# Patient Record
Sex: Male | Born: 1950
Health system: Southern US, Community
[De-identification: ages and names within clinical notes are randomized; demographics above are authoritative.]

## PROBLEM LIST (undated history)

## (undated) DIAGNOSIS — I10 Essential (primary) hypertension: Secondary | ICD-10-CM

## (undated) DIAGNOSIS — C160 Malignant neoplasm of cardia: Secondary | ICD-10-CM

## (undated) DIAGNOSIS — Z720 Tobacco use: Secondary | ICD-10-CM

## (undated) DIAGNOSIS — K5790 Diverticulosis of intestine, part unspecified, without perforation or abscess without bleeding: Secondary | ICD-10-CM

## (undated) DIAGNOSIS — D649 Anemia, unspecified: Secondary | ICD-10-CM

## (undated) DIAGNOSIS — E785 Hyperlipidemia, unspecified: Secondary | ICD-10-CM

## (undated) DIAGNOSIS — Z809 Family history of malignant neoplasm, unspecified: Secondary | ICD-10-CM

## (undated) DIAGNOSIS — R7303 Prediabetes: Secondary | ICD-10-CM

## (undated) HISTORY — DX: Malignant neoplasm of cardia: C16.0

## (undated) HISTORY — DX: Anemia, unspecified: D64.9

## (undated) HISTORY — PX: COLONOSCOPY: SHX174

## (undated) HISTORY — DX: Hyperlipidemia, unspecified: E78.5

## (undated) HISTORY — PX: NO PAST SURGERIES: SHX2092

## (undated) HISTORY — DX: Tobacco use: Z72.0

## (undated) HISTORY — DX: Family history of malignant neoplasm, unspecified: Z80.9

## (undated) HISTORY — DX: Diverticulosis of intestine, part unspecified, without perforation or abscess without bleeding: K57.90

## (undated) HISTORY — PX: WISDOM TOOTH EXTRACTION: SHX21

---

## 2007-04-08 ENCOUNTER — Ambulatory Visit: Payer: Self-pay | Admitting: Internal Medicine

## 2007-04-08 DIAGNOSIS — I10 Essential (primary) hypertension: Secondary | ICD-10-CM | POA: Insufficient documentation

## 2007-04-16 ENCOUNTER — Encounter (INDEPENDENT_AMBULATORY_CARE_PROVIDER_SITE_OTHER): Payer: Self-pay | Admitting: *Deleted

## 2007-04-17 ENCOUNTER — Encounter (INDEPENDENT_AMBULATORY_CARE_PROVIDER_SITE_OTHER): Payer: Self-pay | Admitting: *Deleted

## 2007-04-17 ENCOUNTER — Ambulatory Visit: Payer: Self-pay | Admitting: Internal Medicine

## 2007-05-08 ENCOUNTER — Encounter: Payer: Self-pay | Admitting: Internal Medicine

## 2008-04-07 ENCOUNTER — Encounter: Payer: Self-pay | Admitting: Internal Medicine

## 2008-04-08 ENCOUNTER — Ambulatory Visit: Payer: Self-pay | Admitting: Internal Medicine

## 2008-04-14 ENCOUNTER — Ambulatory Visit: Payer: Self-pay | Admitting: Internal Medicine

## 2008-04-14 LAB — CONVERTED CEMR LAB
OCCULT 1: NEGATIVE
OCCULT 2: NEGATIVE
OCCULT 3: NEGATIVE

## 2008-04-15 ENCOUNTER — Encounter (INDEPENDENT_AMBULATORY_CARE_PROVIDER_SITE_OTHER): Payer: Self-pay | Admitting: *Deleted

## 2008-05-12 ENCOUNTER — Ambulatory Visit: Payer: Self-pay | Admitting: Internal Medicine

## 2008-05-22 ENCOUNTER — Encounter (INDEPENDENT_AMBULATORY_CARE_PROVIDER_SITE_OTHER): Payer: Self-pay | Admitting: *Deleted

## 2008-05-22 LAB — CONVERTED CEMR LAB
BUN: 19 mg/dL (ref 6–23)
CO2: 29 meq/L (ref 19–32)
GFR calc Af Amer: 111 mL/min
Glucose, Bld: 80 mg/dL (ref 70–99)
HDL: 56.3 mg/dL (ref >39.0)
Potassium: 4.2 meq/L (ref 3.5–5.1)
Triglycerides: 114 mg/dL (ref 0–149)

## 2009-04-19 ENCOUNTER — Encounter (INDEPENDENT_AMBULATORY_CARE_PROVIDER_SITE_OTHER): Payer: Self-pay | Admitting: *Deleted

## 2009-04-20 ENCOUNTER — Ambulatory Visit: Payer: Self-pay | Admitting: Internal Medicine

## 2009-05-03 ENCOUNTER — Telehealth (INDEPENDENT_AMBULATORY_CARE_PROVIDER_SITE_OTHER): Payer: Self-pay | Admitting: *Deleted

## 2009-05-11 ENCOUNTER — Ambulatory Visit: Payer: Self-pay | Admitting: Internal Medicine

## 2009-05-11 DIAGNOSIS — Z720 Tobacco use: Secondary | ICD-10-CM

## 2009-05-11 DIAGNOSIS — N400 Enlarged prostate without lower urinary tract symptoms: Secondary | ICD-10-CM | POA: Insufficient documentation

## 2009-05-12 ENCOUNTER — Encounter (INDEPENDENT_AMBULATORY_CARE_PROVIDER_SITE_OTHER): Payer: Self-pay | Admitting: *Deleted

## 2009-05-12 LAB — CONVERTED CEMR LAB
ALT: 29 units/L (ref 0–53)
AST: 31 units/L (ref 0–37)
Albumin: 4.6 g/dL (ref 3.5–5.2)
Alkaline Phosphatase: 58 units/L (ref 39–117)
BUN: 12 mg/dL (ref 6–23)
Basophils Absolute: 0 10*3/uL (ref 0.0–0.1)
CO2: 29 meq/L (ref 19–32)
Eosinophils Absolute: 0.1 10*3/uL (ref 0.0–0.7)
GFR calc non Af Amer: 104.59 mL/min (ref >60)
Glucose, Bld: 96 mg/dL (ref 70–99)
Hemoglobin: 15.9 g/dL (ref 13.0–17.0)
Lymphocytes Relative: 17.7 % (ref 12.0–46.0)
MCHC: 34.3 g/dL (ref 30.0–36.0)
Monocytes Relative: 6.4 % (ref 3.0–12.0)
Neutro Abs: 7.8 10*3/uL — ABNORMAL HIGH (ref 1.4–7.7)
Neutrophils Relative %: 74.7 % (ref 43.0–77.0)
PSA: 0.81 ng/mL (ref 0.10–4.00)
Platelets: 240 10*3/uL (ref 150.0–400.0)
Potassium: 4.1 meq/L (ref 3.5–5.1)
RDW: 12 % (ref 11.5–14.6)
Sodium: 141 meq/L (ref 135–145)
TSH: 0.48 microintl units/mL (ref 0.35–5.50)
Total Protein: 7.1 g/dL (ref 6.0–8.3)

## 2009-05-18 ENCOUNTER — Encounter (INDEPENDENT_AMBULATORY_CARE_PROVIDER_SITE_OTHER): Payer: Self-pay

## 2009-05-19 ENCOUNTER — Ambulatory Visit: Payer: Self-pay | Admitting: Internal Medicine

## 2009-06-07 ENCOUNTER — Ambulatory Visit: Payer: Self-pay | Admitting: Internal Medicine

## 2009-06-09 ENCOUNTER — Encounter: Payer: Self-pay | Admitting: Internal Medicine

## 2009-08-05 ENCOUNTER — Ambulatory Visit: Payer: Self-pay | Admitting: Internal Medicine

## 2009-08-05 DIAGNOSIS — E785 Hyperlipidemia, unspecified: Secondary | ICD-10-CM | POA: Insufficient documentation

## 2009-08-05 LAB — CONVERTED CEMR LAB
Cholesterol, target level: 200 mg/dL
HDL goal, serum: 40 mg/dL
LDL Goal: 110 mg/dL

## 2009-12-27 ENCOUNTER — Ambulatory Visit: Payer: Self-pay | Admitting: Internal Medicine

## 2009-12-27 LAB — CONVERTED CEMR LAB
Direct LDL: 131.7 mg/dL
HDL: 62.9 mg/dL (ref >39.00)

## 2010-01-03 ENCOUNTER — Ambulatory Visit: Payer: Self-pay | Admitting: Internal Medicine

## 2010-04-18 ENCOUNTER — Ambulatory Visit: Payer: Self-pay | Admitting: Internal Medicine

## 2010-07-19 NOTE — Assessment & Plan Note (Signed)
Summary: 6 month roa//lch   Vital Signs:  Patient profile:   60 year old male Weight:      182 pounds Pulse rate:   72 / minute Resp:     15 per minute BP sitting:   110 / 62  (left arm) Cuff size:   large  Vitals Entered By: Shonna Chock CMA (January 03, 2010 3:10 PM) CC: 6 Month follow-up, discuss labs (copy given), Lipid Management   CC:  6 Month follow-up, discuss labs (copy given), and Lipid Management.  History of Present Illness: Labs reviewed & risks discussed. CVE daily as walking or ellipitical.Decreased processed foods. Smoking 2-5 / day. Statin not elected.  Lipid Management History:      Positive NCEP/ATP III risk factors include male age 66 years old or older, family history for ischemic heart disease (males less than 68 years old), current tobacco user, and hypertension.  Negative NCEP/ATP III risk factors include non-diabetic, no ASHD (atherosclerotic heart disease), no prior stroke/TIA, no peripheral vascular disease, and no history of aortic aneurysm.     Current Medications (verified): 1)  Lisinopril-Hydrochlorothiazide 20-12.5 Mg Tabs (Lisinopril-Hydrochlorothiazide) .Marland Kitchen.. 1 Once Daily 2)  Aspirin 81 Mg Tabs (Aspirin) .Marland Kitchen.. 1 By Mouth Once Daily 3)  Fish Oil 1000 Mg Caps (Omega-3 Fatty Acids) .Marland Kitchen.. 1 By Mouth Once Daily 4)  Multivitamins  Tabs (Multiple Vitamin) .Marland Kitchen.. 1 By Mouth Once Daily  Allergies: 1)  ! * Muscle Relaxer  Past History:  Past Medical History: Hypertension Severe contact dermatitis with Poison Ivy; Dramatic FH premature CAD;Smoker; Possible remote Asbestos exposure Hyperlipidemia: NMR : LDL 161(0960/454), HDL 70,TG 139. LDL goal < 110. Framingham Study LDL goal = < 130.  Family History: Father:MI @ 20, smoker  Mother: bone  cancer Siblings: sister ? uterine cancer , ? boney mets ; P uncle smoker, d MI @ 20; P  uncle   d MI @ 64  Review of Systems CV:  Denies chest pain or discomfort, leg cramps with exertion, shortness of breath with  exertion, swelling of feet, and swelling of hands.  Physical Exam  General:  well-nourished; alert,appropriate and cooperative throughout examination Heart:  Normal rate and regular rhythm. S1  normal without gallop, murmur, click, rub or other extra sounds.S2 slurred Pulses:  R and L carotid,radial,dorsalis pedis and posterior tibial pulses are full and equal bilaterally   Impression & Recommendations:  Problem # 1:  HYPERLIPIDEMIA (ICD-272.4)  Problem # 2:  SMOKER (ICD-305.1) impact on plaque discussed  Problem # 3:  HYPERTENSION, ESSENTIAL NOS (ICD-401.9) controlled His updated medication list for this problem includes:    Lisinopril-hydrochlorothiazide 20-12.5 Mg Tabs (Lisinopril-hydrochlorothiazide) .Marland Kitchen... 1 once daily  Complete Medication List: 1)  Lisinopril-hydrochlorothiazide 20-12.5 Mg Tabs (Lisinopril-hydrochlorothiazide) .Marland Kitchen.. 1 once daily 2)  Aspirin 81 Mg Tabs (Aspirin) .Marland Kitchen.. 1 by mouth once daily 3)  Fish Oil 1000 Mg Caps (Omega-3 fatty acids) .Marland Kitchen.. 1 by mouth once daily 4)  Multivitamins Tabs (Multiple vitamin) .Marland Kitchen.. 1 by mouth once daily  Lipid Assessment/Plan:      Based on NCEP/ATP III, the patient's risk factor category is "2 or more risk factors and a calculated 10 year CAD risk of > 20%".  The patient's lipid goals are as follows: Total cholesterol goal is 200; LDL cholesterol goal is 110; HDL cholesterol goal is 40; Triglyceride goal is 150.  His LDL cholesterol goal has not been met.  Secondary causes for hyperlipidemia have been ruled out.  He has been counseled on adjunctive measures for  lowering his cholesterol and has been provided with dietary instructions.    Patient Instructions: 1)  Monitor BP on LisinoprilHCT 1/2 once daily ; goal = AVERAGE < 135/85.Consider risk modification as discussed. Prescriptions: LISINOPRIL-HYDROCHLOROTHIAZIDE 20-12.5 MG TABS (LISINOPRIL-HYDROCHLOROTHIAZIDE) 1 once daily  #90 x 1   Entered and Authorized by:   Marga Melnick MD    Signed by:   Marga Melnick MD on 01/03/2010   Method used:   Print then Give to Patient   RxID:   [id number]

## 2010-07-19 NOTE — Letter (Signed)
Summary: Cancer Screening/[facility] Personalized Risk Profile  Cancer Screening/[facility] Personalized Risk Profile   Imported By: [name] 08/11/2009 10:45:54  _____________________________________________________________________  External Attachment:    Type:   Image     Comment:   External Document

## 2010-07-19 NOTE — Assessment & Plan Note (Signed)
Summary: CPX- JR, FASTING LABS   Vital Signs:  Patient profile:   60 year old male Height:      71.5 inches Weight:      184.6 pounds Temp:     98.0 degrees F oral Pulse rate:   61 / minute Resp:     14 per minute BP sitting:   142 / 81  (left arm) Cuff size:   large  Vitals Entered By: Shonna Chock (August 05, 2009 9:25 AM) CC: Follow-up visit: Follow-up on NMR, fasting if labs needed. Patient never took Pravastatin, Lipid Management Comments REVIEWED MED LIST, PATIENT AGREED DOSE AND INSTRUCTION CORRECT   CC:  Follow-up visit: Follow-up on NMR, fasting if labs needed. Patient never took Pravastatin, and Lipid Management.  History of Present Illness: NMR Lipoprofile reviewed & risks discussed with Mr & Mrs  Arpino. [employer]' ; smoking decreased by 1/2. CVE as walking  & machines.  Lipid Management History:      Positive NCEP/ATP III risk factors include male age 34 years old or older, family history for ischemic heart disease (males less than 40 years old), current tobacco user, and hypertension.  Negative NCEP/ATP III risk factors include non-diabetic, no ASHD (atherosclerotic heart disease), no prior stroke/TIA, no peripheral vascular disease, and no history of aortic aneurysm.     Allergies: 1)  ! * Muscle Relaxer  Past History:  Past Medical History: Hypertension Severe contact dermatitis with Poison Ivy; Dramatic FH premature CAD;Smoker; Possible remote Asbestos exposure Hyperlipidemia: NMR : LDL 295(6213/086), HDL 70,TG 139. LDL goal < 110  Family History: Father:MI @ 93, smoker  Mother: bone CA Siblings: sister ? uterine CA , ? boney mets ; P uncle smoker, d MI @ 74; P  smoker,uncle d MI @ 49  Review of Systems CV:  Denies chest pain or discomfort, leg cramps with exertion, palpitations, and shortness of breath with exertion.  Physical Exam  General:  well-nourished; alert,appropriate and cooperative throughout examination Heart:  Normal rate and  regular rhythm. S1 and S2 normal without gallop, murmur, click, rub . S4 Pulses:  R and L carotid,radial,dorsalis pedis and posterior tibial pulses are full and equal bilaterally Psych:  memory intact for recent and remote, normally interactive, and good eye contact.   Intelligent & focused   Impression & Recommendations:  Problem # 1:  HYPERLIPIDEMIA (ICD-272.4)  The following medications were removed from the medication list:    Pravastatin Sodium 20 Mg Tabs (Pravastatin sodium) .Marland Kitchen... 1 at bedtime  Complete Medication List: 1)  Lisinopril-hydrochlorothiazide 20-12.5 Mg Tabs (Lisinopril-hydrochlorothiazide) .Marland Kitchen.. 1 once daily  Lipid Assessment/Plan:      Based on NCEP/ATP III, the patient's risk factor category is "2 or more risk factors and a calculated 10 year CAD risk of > 20%".  The patient's lipid goals are as follows: Total cholesterol goal is 200; LDL cholesterol goal is 110; HDL cholesterol goal is 40; Triglyceride goal is 150.  His LDL cholesterol goal has not been met.  Secondary causes for hyperlipidemia have been ruled out.  He has been counseled on adjunctive measures for lowering his cholesterol and has been provided with dietary instructions.    Patient Instructions: 1)  Review Dr Gildardo Griffes Eat , Drink & Be Healthy.-6  months. 2)  Take a coated  Aspirin every day. 3)  Lipid Panel prior to visit, ICD-9:272.4. Your LDL goal = < 110.

## 2010-07-19 NOTE — Assessment & Plan Note (Signed)
Summary: flu shot/cbs  Nurse Visit   Allergies: 1)  ! * Muscle Relaxer  Orders Added: 1)  Admin 1st Vaccine [90471] 2)  Flu Vaccine 97yrs + [78295]         Flu Vaccine Consent Questions     Do you have a history of severe allergic reactions to this vaccine? no    Any prior history of allergic reactions to egg and/or gelatin? no    Do you have a sensitivity to the preservative Thimersol? no    Do you have a past history of Guillan-Barre Syndrome? no    Do you currently have an acute febrile illness? no    Have you ever had a severe reaction to latex? no    Vaccine information given and explained to patient? yes    Are you currently pregnant? no    Lot Number:[id number]   Exp Date:12/17/2010   Site Given  Left Deltoid IM

## 2010-10-11 ENCOUNTER — Other Ambulatory Visit: Payer: Self-pay | Admitting: Internal Medicine

## 2011-01-22 ENCOUNTER — Other Ambulatory Visit: Payer: Self-pay | Admitting: Internal Medicine

## 2011-01-23 NOTE — Telephone Encounter (Signed)
Patient needs to schedule a CPX  

## 2011-02-24 ENCOUNTER — Telehealth: Payer: Self-pay | Admitting: Internal Medicine

## 2011-02-24 NOTE — Telephone Encounter (Signed)
Message copied by Merrie Roof on Fri Feb 24, 2011 11:56 AM ------      Message from: Pecola Lawless      Created: Sun Jan 29, 2011  3:05 PM       Last labs 7/11; he needs F/U before next refill

## 2011-02-24 NOTE — Telephone Encounter (Signed)
Pt has appt for followup for 03/01/2011 at 3:00

## 2011-03-01 ENCOUNTER — Ambulatory Visit (INDEPENDENT_AMBULATORY_CARE_PROVIDER_SITE_OTHER): Payer: BC Managed Care – PPO | Admitting: Internal Medicine

## 2011-03-01 ENCOUNTER — Encounter: Payer: Self-pay | Admitting: Internal Medicine

## 2011-03-01 DIAGNOSIS — I10 Essential (primary) hypertension: Secondary | ICD-10-CM

## 2011-03-01 DIAGNOSIS — E785 Hyperlipidemia, unspecified: Secondary | ICD-10-CM

## 2011-03-01 MED ORDER — LISINOPRIL-HYDROCHLOROTHIAZIDE 20-12.5 MG PO TABS: 1.0000 | ORAL_TABLET | Freq: Every day | ORAL | Status: DC

## 2011-03-01 NOTE — Progress Notes (Signed)
  Subjective:    Patient ID: [name] [name], male    DOB: 28-Oct-1950, 60 y.o.   MRN: [id number]  HPI HYPERTENSION: Disease Monitoring  Blood pressure range: not monitored  Chest pain: no   Dyspnea: no   Claudication: no   Medication compliance: yes,   Medication Side Effects  Lightheadedness: no   Urinary frequency: no   Edema: no    Preventitive Healthcare:  Exercise: yes, walking 9 holes daily w/o symptoms   Diet Pattern: heart healthy diet  Salt Restriction: yes Smoking :socially intermittently      Review of Systems     Objective:   Physical Exam General appearance ;thin but in good health and well nourished  Eyes: No conjunctival inflammation or scleral icterus is present.     Heart:  Normal rate and regular rhythm. S1 and S2 normal without gallop,  click, rub or other extra sounds  . Grade 1/6 basilar systolic murmur   Lungs:Chest clear to auscultation; no wheezes, rhonchi,rales ,or rubs present.No increased work of breathing.    Skin:Warm & dry. ; no ischemic changes   All pulses intact without  bruits .             Assessment & Plan:  #1 hypertension, questionable control.  #2 dyslipidemia with excellent HDL levels.  #3 family history of premature coronary disease  Plan: See orders and recommendations.

## 2011-03-01 NOTE — Patient Instructions (Signed)
Blood Pressure Goal  Ideally is an AVERAGE < 135/85. This AVERAGE should be calculated from @ least 5-7 BP readings taken @ different times of day on different days of week. You should not respond to isolated BP readings , but rather the AVERAGE for that week.  Please  schedule fasting Labs : BMET,Lipids, hepatic panel, TSH(401.9, 272.4,995.20)

## 2011-03-03 ENCOUNTER — Other Ambulatory Visit: Payer: Self-pay | Admitting: Internal Medicine

## 2011-03-03 DIAGNOSIS — I10 Essential (primary) hypertension: Secondary | ICD-10-CM

## 2011-03-03 DIAGNOSIS — T887XXA Unspecified adverse effect of drug or medicament, initial encounter: Secondary | ICD-10-CM

## 2011-03-03 DIAGNOSIS — E785 Hyperlipidemia, unspecified: Secondary | ICD-10-CM

## 2011-03-06 ENCOUNTER — Other Ambulatory Visit (INDEPENDENT_AMBULATORY_CARE_PROVIDER_SITE_OTHER): Payer: BC Managed Care – PPO

## 2011-03-06 DIAGNOSIS — T887XXA Unspecified adverse effect of drug or medicament, initial encounter: Secondary | ICD-10-CM

## 2011-03-06 DIAGNOSIS — E785 Hyperlipidemia, unspecified: Secondary | ICD-10-CM

## 2011-03-06 DIAGNOSIS — I10 Essential (primary) hypertension: Secondary | ICD-10-CM

## 2011-03-06 LAB — BASIC METABOLIC PANEL
BUN: 16 mg/dL (ref 6–23)
Creatinine, Ser: 1 mg/dL (ref 0.4–1.5)
GFR: 84.24 mL/min (ref >60.00)
Glucose, Bld: 101 mg/dL — ABNORMAL HIGH (ref 70–99)

## 2011-03-06 LAB — HEPATIC FUNCTION PANEL
ALT: 30 U/L (ref 0–53)
Alkaline Phosphatase: 54 U/L (ref 39–117)
Bilirubin, Direct: 0.1 mg/dL (ref 0.0–0.3)
Total Bilirubin: 1 mg/dL (ref 0.3–1.2)
Total Protein: 7 g/dL (ref 6.0–8.3)

## 2011-03-06 LAB — LIPID PANEL
Cholesterol: 198 mg/dL (ref 0–200)
LDL Cholesterol: 115 mg/dL — ABNORMAL HIGH (ref 0–99)
VLDL: 24 mg/dL (ref 0.0–40.0)

## 2011-03-06 NOTE — Progress Notes (Signed)
Labs only

## 2011-03-22 ENCOUNTER — Other Ambulatory Visit (INDEPENDENT_AMBULATORY_CARE_PROVIDER_SITE_OTHER): Payer: BC Managed Care – PPO

## 2011-03-22 DIAGNOSIS — R7309 Other abnormal glucose: Secondary | ICD-10-CM

## 2011-03-22 DIAGNOSIS — R799 Abnormal finding of blood chemistry, unspecified: Secondary | ICD-10-CM

## 2011-03-22 NOTE — Progress Notes (Signed)
Labs only

## 2011-03-24 LAB — HEMOGLOBIN A1C: Hgb A1c MFr Bld: 6.4 % (ref 4.6–6.5)

## 2011-04-27 ENCOUNTER — Ambulatory Visit (INDEPENDENT_AMBULATORY_CARE_PROVIDER_SITE_OTHER): Payer: BC Managed Care – PPO

## 2011-04-27 DIAGNOSIS — Z23 Encounter for immunization: Secondary | ICD-10-CM

## 2011-10-13 ENCOUNTER — Other Ambulatory Visit: Payer: Self-pay | Admitting: Internal Medicine

## 2011-12-12 ENCOUNTER — Other Ambulatory Visit: Payer: Self-pay | Admitting: Internal Medicine

## 2011-12-12 NOTE — Telephone Encounter (Signed)
Refill done.  

## 2012-02-05 ENCOUNTER — Other Ambulatory Visit: Payer: Self-pay | Admitting: Internal Medicine

## 2012-02-06 NOTE — Telephone Encounter (Signed)
Patient needs to schedule a CPX 02/2012 or after

## 2012-04-17 ENCOUNTER — Ambulatory Visit (INDEPENDENT_AMBULATORY_CARE_PROVIDER_SITE_OTHER): Payer: Managed Care, Other (non HMO)

## 2012-04-17 DIAGNOSIS — Z23 Encounter for immunization: Secondary | ICD-10-CM

## 2012-04-26 ENCOUNTER — Encounter: Payer: Self-pay | Admitting: Internal Medicine

## 2013-01-01 ENCOUNTER — Encounter: Payer: Self-pay | Admitting: Internal Medicine

## 2013-02-04 ENCOUNTER — Other Ambulatory Visit: Payer: Self-pay | Admitting: Internal Medicine

## 2013-02-04 NOTE — Telephone Encounter (Signed)
Left message on voicemail informing patient that he is overdue for an appointment. An appointment will need to be schedule ASAP, patient can be seen tomorrow in same day slot.

## 2013-02-07 NOTE — Telephone Encounter (Signed)
Patient with pending appointment 02/24/2013, rx sent for 30 day supply

## 2013-02-24 ENCOUNTER — Ambulatory Visit (INDEPENDENT_AMBULATORY_CARE_PROVIDER_SITE_OTHER): Payer: Managed Care, Other (non HMO) | Admitting: Internal Medicine

## 2013-02-24 ENCOUNTER — Encounter: Payer: Self-pay | Admitting: Internal Medicine

## 2013-02-24 VITALS — BP 136/79 | HR 79 | Temp 98.7°F | Ht 72.25 in | Wt 173.0 lb

## 2013-02-24 DIAGNOSIS — Z23 Encounter for immunization: Secondary | ICD-10-CM

## 2013-02-24 DIAGNOSIS — I10 Essential (primary) hypertension: Secondary | ICD-10-CM

## 2013-02-24 DIAGNOSIS — F17200 Nicotine dependence, unspecified, uncomplicated: Secondary | ICD-10-CM

## 2013-02-24 DIAGNOSIS — R7309 Other abnormal glucose: Secondary | ICD-10-CM

## 2013-02-24 DIAGNOSIS — E785 Hyperlipidemia, unspecified: Secondary | ICD-10-CM

## 2013-02-24 DIAGNOSIS — R7303 Prediabetes: Secondary | ICD-10-CM | POA: Insufficient documentation

## 2013-02-24 MED ORDER — LISINOPRIL-HYDROCHLOROTHIAZIDE 20-12.5 MG PO TABS: ORAL_TABLET | ORAL | Status: DC

## 2013-02-24 NOTE — Assessment & Plan Note (Signed)
Pathophysiology and risk discussed

## 2013-02-24 NOTE — Assessment & Plan Note (Signed)
Fasting lipids, AST, ALT, and TSH

## 2013-02-24 NOTE — Progress Notes (Signed)
  Subjective:    Patient ID: [name] [name], male    DOB: March 24, 1951, 62 y.o.   MRN: [id number]  HPI Blood pressure is not monitored at home regularly.  Medication compliance is good. Adverse medication effects denied. Sodium restriction is employed. Cardiovascular exercise is walking 9 holes daily over 30 -45 minutes.         Review of Systems Epistaxis, headache, lightheadedness, chest pain, palpitations, dyspnea, edema, and claudication are not present. He denies cough with ACE-I.  His last lipids reveal an LDL of 115 and HDL of 58.9 in September 2012. He is adverse to taking statins.  At that time his A1c was 6.4%. There is no family history diabetes. He had a recent ophthalmologic exam which revealed no retinopathy.  He denies excessive thirst, hunger, or urination. He has no numbness, tingling, burning in his extremities. Weight is stable.     Objective:   Physical Exam Gen.:  well-nourished in appearance. Alert, appropriate and cooperative throughout exam. Appears younger than stated age  Head: Normocephalic without obvious abnormalities; beard & moustache; pattern alopecia  Eyes: No corneal or conjunctival inflammation noted.  Extraocular motion intact.  Nose: External nasal exam reveals no deformity or inflammation. Nasal mucosa are pink and moist. No lesions or exudates noted.   Mouth: Oral mucosa and oropharynx reveal no lesions or exudates. Teeth in good repair. Neck: No deformities, masses, or tenderness noted. Range of motion & Thyroid normal. Lungs: Normal respiratory effort; chest expands symmetrically. Lungs are clear to auscultation without rales, wheezes, or increased work of breathing. Heart: Normal rate and rhythm. Normal S1 and S2. No gallop, click, or rub. No murmur. Abdomen: Bowel sounds normal; abdomen soft and nontender. No masses, organomegaly or hernias noted.Aorta palpable ; no AAA                               Musculoskeletal/extremities: No deformity  or scoliosis noted of  the thoracic or lumbar spine.  Slight clubbing w/o cyanosis, edema, or significant extremity  deformity noted. Range of motion normal .Tone & strength  Normal. Joints normal. Nail health good. Able to lie down & sit up w/o help.  Vascular: Carotid, radial artery, dorsalis pedis and  posterior tibial pulses are full and equal. No bruits present. Neurologic: Alert and oriented x3. Deep tendon reflexes symmetrical and normal.      Skin: Intact without suspicious lesions or rashes. Lymph: No cervical, axillary lymphadenopathy present. Psych: Mood and affect are normal. Normally interactive                                                                                        Assessment & Plan:   See Current Assessment & Plan in Problem List under specific Diagnosis

## 2013-02-24 NOTE — Patient Instructions (Addendum)
Minimal Blood Pressure Goal= AVERAGE < 140/90;  Ideal is an AVERAGE < 135/85. This AVERAGE should be calculated from @ least 5-7 BP readings taken @ different times of day on different days of week. You should not respond to isolated BP readings , but rather the AVERAGE for that week .Please bring your  blood pressure cuff to office visits to verify that it is reliable.It  can also be checked against the blood pressure device at the pharmacy. Finger or wrist cuffs are not dependable; an arm cuff is. Please think about quitting smoking. Review the risks we discussed. Please call 1-800-QUIT-NOW (1-800-784-8669) for free smoking cessation counseling.  If you activate the  My Chart system; lab & Xray results will be released directly  to you as soon as I review & address these through the computer. If you choose not to sign up for My Chart within 36 hours of labs being drawn; results will be reviewed & interpretation added before being copied & mailed, causing a delay in getting the results to you.If you do not receive that report within 7-10 days ,please call. Additionally you can use this system to gain direct  access to your records  if  out of town or @ an office of a  physician who is not in  the My Chart network.  This improves continuity of care & places you in control of your medical record.   

## 2013-02-24 NOTE — Assessment & Plan Note (Signed)
A1c, urinalysis, and urine microalbumin

## 2013-02-24 NOTE — Assessment & Plan Note (Signed)
Blood pressure goals outlined. If hypokalemia oral significant hyperglycemia present; the HCTZ should be changed.

## 2013-02-25 ENCOUNTER — Other Ambulatory Visit: Payer: Managed Care, Other (non HMO)

## 2013-02-25 LAB — MICROALBUMIN / CREATININE URINE RATIO
Microalb Creat Ratio: 0.4 mg/g (ref 0.0–30.0)
Microalb, Ur: 1.1 mg/dL (ref 0.0–1.9)

## 2013-02-25 LAB — HEMOGLOBIN A1C: Hgb A1c MFr Bld: 5.7 % (ref 4.6–6.5)

## 2013-02-25 LAB — BASIC METABOLIC PANEL
BUN: 15 mg/dL (ref 6–23)
Calcium: 9.3 mg/dL (ref 8.4–10.5)
Creatinine, Ser: 1 mg/dL (ref 0.4–1.5)
GFR: 84.72 mL/min (ref >60.00)
Glucose, Bld: 93 mg/dL (ref 70–99)

## 2013-02-25 LAB — TSH: TSH: 0.98 u[IU]/mL (ref 0.35–5.50)

## 2013-03-03 ENCOUNTER — Telehealth: Payer: Self-pay | Admitting: *Deleted

## 2013-03-03 NOTE — Telephone Encounter (Signed)
Message copied by Baldwin Jamaica on Mon Mar 03, 2013 12:14 PM ------      Message from: Pecola Lawless      Created: Wed Feb 26, 2013  6:10 PM       LDL or BAD cholesterol is minimally  elevated beyond goal of 100 , but the HDL (GOOD) > 40  is protective.No Diabetes risk present if A1c is < 6.1%.       All other lab results are excellent. Hopp                    ------

## 2013-03-03 NOTE — Telephone Encounter (Signed)
Attempted to contact pt regarding recent labs,left message on voice mail to return our call.

## 2013-03-04 ENCOUNTER — Telehealth: Payer: Self-pay | Admitting: *Deleted

## 2013-03-04 NOTE — Telephone Encounter (Signed)
Pt called back, advised of labs being okay and to continue with healthy diet and exercise.

## 2013-03-11 ENCOUNTER — Encounter: Payer: Self-pay | Admitting: *Deleted

## 2013-07-10 ENCOUNTER — Emergency Department (HOSPITAL_COMMUNITY): Payer: Managed Care, Other (non HMO)

## 2013-07-10 ENCOUNTER — Emergency Department (HOSPITAL_COMMUNITY)
Admission: EM | Admit: 2013-07-10 | Discharge: 2013-07-10 | Disposition: A | Discharge status: Home / Self Care | Payer: Managed Care, Other (non HMO) | Attending: Emergency Medicine | Admitting: Emergency Medicine

## 2013-07-10 ENCOUNTER — Encounter (HOSPITAL_COMMUNITY): Payer: Self-pay | Admitting: Emergency Medicine

## 2013-07-10 DIAGNOSIS — R61 Generalized hyperhidrosis: Secondary | ICD-10-CM | POA: Insufficient documentation

## 2013-07-10 DIAGNOSIS — Z79899 Other long term (current) drug therapy: Secondary | ICD-10-CM | POA: Insufficient documentation

## 2013-07-10 DIAGNOSIS — Z7982 Long term (current) use of aspirin: Secondary | ICD-10-CM | POA: Insufficient documentation

## 2013-07-10 DIAGNOSIS — I10 Essential (primary) hypertension: Secondary | ICD-10-CM | POA: Insufficient documentation

## 2013-07-10 DIAGNOSIS — R42 Dizziness and giddiness: Secondary | ICD-10-CM | POA: Insufficient documentation

## 2013-07-10 DIAGNOSIS — R55 Syncope and collapse: Secondary | ICD-10-CM | POA: Insufficient documentation

## 2013-07-10 DIAGNOSIS — F17200 Nicotine dependence, unspecified, uncomplicated: Secondary | ICD-10-CM | POA: Insufficient documentation

## 2013-07-10 HISTORY — DX: Essential (primary) hypertension: I10

## 2013-07-10 LAB — CBC WITH DIFFERENTIAL/PLATELET
Basophils Absolute: 0 10*3/uL (ref 0.0–0.1)
Basophils Relative: 0 % (ref 0–1)
Eosinophils Absolute: 0 10*3/uL (ref 0.0–0.7)
Eosinophils Relative: 0 % (ref 0–5)
HEMATOCRIT: 42.4 % (ref 39.0–52.0)
Hemoglobin: 14.9 g/dL (ref 13.0–17.0)
LYMPHS PCT: 15 % (ref 12–46)
Lymphs Abs: 0.7 10*3/uL (ref 0.7–4.0)
MCH: 32.2 pg (ref 26.0–34.0)
MCHC: 35.1 g/dL (ref 30.0–36.0)
MCV: 91.6 fL (ref 78.0–100.0)
MONO ABS: 0.6 10*3/uL (ref 0.1–1.0)
Monocytes Relative: 14 % — ABNORMAL HIGH (ref 3–12)
Neutro Abs: 3.2 10*3/uL (ref 1.7–7.7)
Neutrophils Relative %: 71 % (ref 43–77)
Platelets: 163 10*3/uL (ref 150–400)
RBC: 4.63 MIL/uL (ref 4.22–5.81)
RDW: 12.1 % (ref 11.5–15.5)
WBC: 4.5 10*3/uL (ref 4.0–10.5)

## 2013-07-10 LAB — COMPREHENSIVE METABOLIC PANEL
ALT: 21 U/L (ref 0–53)
AST: 28 U/L (ref 0–37)
Albumin: 3.5 g/dL (ref 3.5–5.2)
Alkaline Phosphatase: 55 U/L (ref 39–117)
BILIRUBIN TOTAL: 0.2 mg/dL — AB (ref 0.3–1.2)
BUN: 18 mg/dL (ref 6–23)
CHLORIDE: 100 meq/L (ref 96–112)
CO2: 21 meq/L (ref 19–32)
CREATININE: 0.77 mg/dL (ref 0.50–1.35)
Calcium: 8.5 mg/dL (ref 8.4–10.5)
GFR calc Af Amer: >90 mL/min (ref >90)
Glucose, Bld: 86 mg/dL (ref 70–99)
Potassium: 4.2 mEq/L (ref 3.7–5.3)
Sodium: 136 mEq/L — ABNORMAL LOW (ref 137–147)
Total Protein: 6.6 g/dL (ref 6.0–8.3)

## 2013-07-10 LAB — TROPONIN I

## 2013-07-10 MED ORDER — SODIUM CHLORIDE 0.9 % IV BOLUS (SEPSIS)
500.0000 mL | INTRAVENOUS | Status: AC
  Administered 2013-07-10: 500 mL via INTRAVENOUS

## 2013-07-10 NOTE — ED Notes (Signed)
Per EMS- Pt was standing this morning talking at 0615 when he felt dizzy, developed a cold sweat and became clammy. He was assisted to a chair and did not actually lose consciousness. Symptoms resolved on their own. Denies any cp, n/v. This morning only had coffee. BP 128/82, HR 79 SR, RR 18, CBG 82. Pt is a x 4. No pain, skin warm and dry.

## 2013-07-10 NOTE — ED Provider Notes (Signed)
CSN: [id number]     Arrival date & time 07/10/13  2831 History   First MD Initiated Contact with Patient 07/10/13 0730     Chief Complaint  Patient presents with  . Near Syncope   (Consider location/radiation/quality/duration/timing/severity/associated sxs/prior Treatment) Patient is a 63 y.o. male presenting with near-syncope. The history is provided by the patient.  Near Syncope This is a new problem. The current episode started 1 to 2 hours ago. Episode frequency: once. The problem has been resolved. Pertinent negatives include no chest pain, no abdominal pain, no headaches and no shortness of breath. Nothing aggravates the symptoms. Relieved by: rest. He has tried nothing for the symptoms. The treatment provided significant relief.    Past Medical History  Diagnosis Date  . Hypertension    History reviewed. No pertinent past surgical history. No family history on file. History  Substance Use Topics  . Smoking status: Current Some Day Smoker  . Smokeless tobacco: Not on file  . Alcohol Use: Yes    Review of Systems  Constitutional: Positive for diaphoresis. Negative for fever.  HENT: Negative for drooling and rhinorrhea.   Eyes: Negative for pain.  Respiratory: Negative for cough and shortness of breath.   Cardiovascular: Positive for near-syncope. Negative for chest pain and leg swelling.  Gastrointestinal: Negative for nausea, vomiting, abdominal pain and diarrhea.  Genitourinary: Negative for dysuria and hematuria.  Musculoskeletal: Negative for gait problem and neck pain.  Skin: Negative for color change.  Neurological: Positive for dizziness. Negative for numbness and headaches.  Hematological: Negative for adenopathy.  Psychiatric/Behavioral: Negative for behavioral problems.  All other systems reviewed and are negative.    Allergies  Review of patient's allergies indicates no known allergies.  Home Medications   Current Outpatient Rx  Name  Route  Sig   Dispense  Refill  . aspirin 81 MG tablet   Oral   Take 81 mg by mouth daily.           Marland Kitchen lisinopril-hydrochlorothiazide (PRINZIDE,ZESTORETIC) 20-12.5 MG per tablet      take 1 tablet by mouth once daily   90 tablet   3    BP 141/85  Pulse 74  Temp(Src) 99.4 F (37.4 C) (Oral)  Resp 25  Ht 6' (1.829 m)  Wt 165 lb (74.844 kg)  BMI 22.37 kg/m2  SpO2 97% Physical Exam  Nursing note and vitals reviewed. Constitutional: He is oriented to person, place, and time. He appears well-developed and well-nourished.  HENT:  Head: Normocephalic and atraumatic.  Right Ear: External ear normal.  Left Ear: External ear normal.  Nose: Nose normal.  Mouth/Throat: Oropharynx is clear and moist. No oropharyngeal exudate.  Eyes: Conjunctivae and EOM are normal. Pupils are equal, round, and reactive to light.  Neck: Normal range of motion. Neck supple.  Cardiovascular: Normal rate, regular rhythm, normal heart sounds and intact distal pulses.  Exam reveals no gallop and no friction rub.   No murmur heard. Pulmonary/Chest: Effort normal and breath sounds normal. No respiratory distress. He has no wheezes.  Abdominal: Soft. Bowel sounds are normal. He exhibits no distension. There is no tenderness. There is no rebound and no guarding.  Musculoskeletal: Normal range of motion. He exhibits no edema and no tenderness.  Neurological: He is alert and oriented to person, place, and time. He has normal strength. No cranial nerve deficit or sensory deficit. He displays a negative Romberg sign. Coordination and gait normal.  Normal finger to nose bilaterally.  Normal speech and  understanding.  The patient easily ambulates forwards and backwards.  Skin: Skin is warm and dry.  Psychiatric: He has a normal mood and affect. His behavior is normal.    ED Course  Procedures (including critical care time) Labs Review Labs Reviewed  CBC WITH DIFFERENTIAL - Abnormal; Notable for the following:    Monocytes  Relative 14 (*)    All other components within normal limits  COMPREHENSIVE METABOLIC PANEL - Abnormal; Notable for the following:    Sodium 136 (*)    Total Bilirubin 0.2 (*)    All other components within normal limits  TROPONIN I   Imaging Review Dg Chest 2 View  07/10/2013   CLINICAL DATA:  Near syncope  EXAM: CHEST  2 VIEW  COMPARISON:  May 11, 2009  FINDINGS: There is mild scarring in the left base, stable. There is no edema or consolidation. Heart size and pulmonary vascularity are normal. No adenopathy. No bone lesions.  IMPRESSION: Mild scarring left base.  No edema or consolidation.   Electronically Signed   By: Lowella Grip M.D.   On: 07/10/2013 08:27    EKG Interpretation    Date/Time:  Thursday July 10 2013 07:32:11 EST Ventricular Rate:  71 PR Interval:  177 QRS Duration: 105 QT Interval:  381 QTC Calculation: 414 R Axis:   5 Text Interpretation:  Sinus rhythm No significant change since last tracing (ecg outside of MUSE in epic) Confirmed by Natika Geyer  MD, Adel Neyer ([id number]) on 07/10/2013 7:40:08 AM            MDM   1. Near syncope    7:50 AM 63 y.o. male who presents with a near syncopal episode which occurred at approximately 6 AM this morning. 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 He states that he did not pass out. He sat down and his symptoms resolved spontaneously within 5 minutes. He is afebrile and vital signs are unremarkable here. He has a normal neurologic exam. He states that he has been up since 4 AM and has been working longer hours the last few days. He has not had anything to eat today and his blood sugar by EMS was 82. Likely neurocardiogenic cause of sx w/ prodrome of diaphoresis/dizziness which resolved quickly w/ rest. Will work up for near syncope. Will have pt eat breakfast and give 500cc IVF bolus.   10:11 AM: I interpreted/reviewed the labs and/or imaging  which were non-contributory.  Pt continues to appear well. No concerning features to episode or comorbidities that make me think he requires admission or observation. I have discussed the diagnosis/risks/treatment options with the patient and believe the pt to be eligible for discharge home to follow-up with pcp next week. We also discussed returning to the ED immediately if new or worsening sx occur. We discussed the sx which are most concerning (e.g., further episodes, syncope, cp, sob, fever) that necessitate immediate return. Any new prescriptions provided to the patient are listed below.  New Prescriptions   No medications on file      Blanchard Kelch, MD 07/10/13 1515

## 2013-07-10 NOTE — ED Notes (Signed)
Ordered patient breakfast tray. Provided coffee.

## 2013-07-10 NOTE — Discharge Instructions (Signed)
Near-Syncope °Near-syncope (commonly known as near fainting) is sudden weakness, dizziness, or feeling like you might pass out. During an episode of near-syncope, you may also develop pale skin, have tunnel vision, or feel sick to your stomach (nauseous). Near-syncope may occur when getting up after sitting or while standing for a long time. It is caused by a sudden decrease in blood flow to the brain. This decrease can result from various causes or triggers, most of which are not serious. However, because near-syncope can sometimes be a sign of something serious, a medical evaluation is required. The specific cause is often not determined. °HOME CARE INSTRUCTIONS  °Monitor your condition for any changes. The following actions may help to alleviate any discomfort you are experiencing: °· Have someone stay with you until you feel stable. °· Lie down right away if you start feeling like you might faint. Breathe deeply and steadily. Wait until all the symptoms have passed. Most of these episodes last only a few minutes. You may feel tired for several hours.   °· Drink enough fluids to keep your urine clear or pale yellow.   °· If you are taking blood pressure or heart medicine, get up slowly when seated or lying down. Take several minutes to sit and then stand. This can reduce dizziness. °· Follow up with your health care provider as directed.  °SEEK IMMEDIATE MEDICAL CARE IF:  °· You have a severe headache.   °· You have unusual pain in the chest, abdomen, or back.   °· You are bleeding from the mouth or rectum, or you have black or tarry stool.   °· You have an irregular or very fast heartbeat.   °· You have repeated fainting or have seizure-like jerking during an episode.   °· You faint when sitting or lying down.   °· You have confusion.   °· You have difficulty walking.   °· You have severe weakness.   °· You have vision problems.   °MAKE SURE YOU:  °· Understand these instructions. °· Will watch your  condition. °· Will get help right away if you are not doing well or get worse. °Document Released: 06/05/2005 Document Revised: 02/05/2013 Document Reviewed: 11/08/2012 °ExitCare® Patient Information ©2014 [employer]. ° °

## 2014-02-16 ENCOUNTER — Other Ambulatory Visit: Payer: Self-pay | Admitting: Internal Medicine

## 2014-05-07 ENCOUNTER — Other Ambulatory Visit: Payer: Self-pay | Admitting: Internal Medicine

## 2014-06-02 ENCOUNTER — Encounter: Payer: Self-pay | Admitting: Internal Medicine

## 2014-06-02 ENCOUNTER — Ambulatory Visit (INDEPENDENT_AMBULATORY_CARE_PROVIDER_SITE_OTHER): Payer: Medicare Other

## 2014-06-02 ENCOUNTER — Ambulatory Visit (INDEPENDENT_AMBULATORY_CARE_PROVIDER_SITE_OTHER)
Admission: RE | Admit: 2014-06-02 | Discharge: 2014-06-02 | Disposition: A | Discharge status: Home / Self Care | Payer: Medicare Other | Source: Ambulatory Visit | Attending: Internal Medicine | Admitting: Internal Medicine

## 2014-06-02 ENCOUNTER — Ambulatory Visit (INDEPENDENT_AMBULATORY_CARE_PROVIDER_SITE_OTHER): Payer: Medicare Other | Admitting: Internal Medicine

## 2014-06-02 VITALS — BP 138/90 | HR 84 | Temp 99.0°F | Resp 14 | Ht 71.75 in | Wt 184.2 lb

## 2014-06-02 DIAGNOSIS — E785 Hyperlipidemia, unspecified: Secondary | ICD-10-CM

## 2014-06-02 DIAGNOSIS — F17200 Nicotine dependence, unspecified, uncomplicated: Secondary | ICD-10-CM

## 2014-06-02 DIAGNOSIS — R0689 Other abnormalities of breathing: Secondary | ICD-10-CM

## 2014-06-02 DIAGNOSIS — R0989 Other specified symptoms and signs involving the circulatory and respiratory systems: Secondary | ICD-10-CM | POA: Diagnosis not present

## 2014-06-02 DIAGNOSIS — I10 Essential (primary) hypertension: Secondary | ICD-10-CM

## 2014-06-02 DIAGNOSIS — Z720 Tobacco use: Secondary | ICD-10-CM

## 2014-06-02 DIAGNOSIS — Z86010 Personal history of colonic polyps: Secondary | ICD-10-CM | POA: Diagnosis not present

## 2014-06-02 DIAGNOSIS — Z23 Encounter for immunization: Secondary | ICD-10-CM | POA: Diagnosis not present

## 2014-06-02 DIAGNOSIS — N400 Enlarged prostate without lower urinary tract symptoms: Secondary | ICD-10-CM

## 2014-06-02 DIAGNOSIS — R7309 Other abnormal glucose: Secondary | ICD-10-CM

## 2014-06-02 DIAGNOSIS — R7303 Prediabetes: Secondary | ICD-10-CM

## 2014-06-02 MED ORDER — LISINOPRIL-HYDROCHLOROTHIAZIDE 20-12.5 MG PO TABS: 1.0000 | ORAL_TABLET | Freq: Every day | ORAL | Status: DC

## 2014-06-02 NOTE — Progress Notes (Signed)
   Subjective:    Patient ID: [name] [name], male    DOB: [birth year], 63 y.o.   MRN: [id number]  HPI He is here to assess active health issues & conditions. PMH, FH, & Social history verified & updated .  He has been compliant with his blood pressure medicines without adverse effects. He is not monitoring his blood pressure regularly & cannot give me an average. He did not take his blood pressure pill this morning.  He is on a modified heart healthy diet. He walks the golf [place] daily without cardio pulmonary symptoms  In December 2010 a tubular adenoma and tubulovillous adenoma were removed. He's had no follow-up. He has no active GI symptoms.    Review of Systems   Chest pain, palpitations, tachycardia, exertional dyspnea, paroxysmal nocturnal dyspnea, claudication or edema are absent.  Unexplained weight loss, abdominal pain, significant dyspepsia, dysphagia, melena, rectal bleeding, or persistently small caliber stools are denied.     Objective:   Physical Exam Gen.: Healthy and well-nourished in appearance. Alert, appropriate and cooperative throughout exam. Appears younger than stated age  Head: Normocephalic without obvious abnormalities; pattern alopecia . Beard & moustache Eyes: No corneal or conjunctival inflammation noted. Pupils equal round reactive to light and accommodation. Extraocular motion intact.  Ears: External  ear exam reveals no significant lesions or deformities. Canals clear .TMs normal. Hearing is grossly normal bilaterally. Nose: External nasal exam reveals no deformity or inflammation. Nasal mucosa are pink and moist. No lesions or exudates noted.   Mouth: Oral mucosa and oropharynx reveal no lesions or exudates but erythema present. Teeth in good repair. Neck: No deformities, masses, or tenderness noted. Range of motion & Thyroid normal. Lungs: Normal respiratory effort; chest expands symmetrically. Minimal rhonchi @ bases without rales, wheezes, or  increased work of breathing. Heart: Normal rate and rhythm. Normal S1 and S2. No gallop, click, or rub. No murmur. Abdomen: Bowel sounds normal; abdomen soft and nontender. No masses or organomegaly  noted. Genitalia: Testicular atrophy. Direct hernia on L. Small left varices. Small L epididymal granuloma.Prostate is mildly enlarged but soft w/o asymmetry, nodularity, or induration                                  Musculoskeletal/extremities: No deformity or scoliosis noted of  the thoracic or lumbar spine.. No clubbing, cyanosis, edema, or significant extremity  deformity noted.  Range of motion normal . Tone & strength normal. Hand joints normal  Fingernail  health good.  Able to lie down & sit up w/o help.  Negative SLR bilaterally Vascular: Carotid, radial artery, dorsalis pedis and  posterior tibial pulses are full and equal. No bruits present. Neurologic: Alert and oriented x3. Deep tendon reflexes symmetrical and normal.  Gait normal  Skin: Intact without suspicious lesions or rashes. Lymph: No cervical, axillary, or inguinal lymphadenopathy present. Psych: Mood and affect are normal. Normally interactive                                 Assessment & Plan:  See Current Assessment & Plan in Problem List under specific Diagnosis

## 2014-06-02 NOTE — Assessment & Plan Note (Signed)
Lipids, LFTs, TSH  

## 2014-06-02 NOTE — Assessment & Plan Note (Signed)
Risks discussed 

## 2014-06-02 NOTE — Patient Instructions (Signed)
Your next office appointment will be determined based upon review of your pending labs . Those instructions will be transmitted to you by mail. Minimal Blood Pressure Goal= AVERAGE < 140/90;  Ideal is an AVERAGE < 135/85. This AVERAGE should be calculated from @ least 5-7 BP readings taken @ different times of day on different days of week. You should not respond to isolated BP readings , but rather the AVERAGE for that week .Please bring your  blood pressure cuff to office visits to verify that it is reliable.It  can also be checked against the blood pressure device at the pharmacy. Finger or wrist cuffs are not dependable; an arm cuff is.  Please think about quitting smoking. Review the risks we discussed. Please call 1-800-QUIT-NOW (1-800-784-8669) for free smoking cessation counseling. There are multiple options are to help you stop smoking. These include nicotine patches, nicotine gum, and the new "E cigarette".   

## 2014-06-02 NOTE — Assessment & Plan Note (Signed)
A1c

## 2014-06-02 NOTE — Progress Notes (Signed)
Pre visit review using our clinic review tool, if applicable. No additional management support is needed unless otherwise documented below in the visit note. 

## 2014-06-02 NOTE — Assessment & Plan Note (Signed)
CBC Verify F/U date

## 2014-06-02 NOTE — Assessment & Plan Note (Signed)
Blood pressure goals reviewed. BMET 

## 2014-06-03 ENCOUNTER — Telehealth: Payer: Self-pay | Admitting: Internal Medicine

## 2014-06-03 ENCOUNTER — Encounter: Payer: Self-pay | Admitting: Internal Medicine

## 2014-06-03 NOTE — Telephone Encounter (Signed)
-----   Message from Gatha Mayer, MD sent at 06/03/2014  8:27 AM EST ----- Regarding: call him please When you get a chance please contact him to arrange a previsit and colon for hx polyps ----- Message -----    From: Hendricks Limes, MD    Sent: 06/03/2014   5:15 AM      To: Gatha Mayer, MD  Looks like he's due for colonoscopy ( tubovillous & tubular adenomas @ last colonoscopy). Thanks, [employer]

## 2014-06-03 NOTE — Telephone Encounter (Signed)
Called and LM on Vmail for Pt to CB to sch'd a colon and Previsit per Dr. [name]

## 2014-06-04 ENCOUNTER — Other Ambulatory Visit (INDEPENDENT_AMBULATORY_CARE_PROVIDER_SITE_OTHER): Payer: Medicare Other

## 2014-06-04 DIAGNOSIS — R7303 Prediabetes: Secondary | ICD-10-CM

## 2014-06-04 DIAGNOSIS — E785 Hyperlipidemia, unspecified: Secondary | ICD-10-CM

## 2014-06-04 DIAGNOSIS — R7309 Other abnormal glucose: Secondary | ICD-10-CM

## 2014-06-04 DIAGNOSIS — I10 Essential (primary) hypertension: Secondary | ICD-10-CM

## 2014-06-04 DIAGNOSIS — Z86010 Personal history of colonic polyps: Secondary | ICD-10-CM | POA: Diagnosis not present

## 2014-06-04 LAB — BASIC METABOLIC PANEL
BUN: 17 mg/dL (ref 6–23)
CALCIUM: 9.8 mg/dL (ref 8.4–10.5)
CO2: 25 mEq/L (ref 19–32)
Chloride: 104 mEq/L (ref 96–112)
Creatinine, Ser: 1 mg/dL (ref 0.4–1.5)
GFR: 83.37 mL/min (ref >60.00)
Glucose, Bld: 96 mg/dL (ref 70–99)
Potassium: 4.2 mEq/L (ref 3.5–5.1)
SODIUM: 139 meq/L (ref 135–145)

## 2014-06-04 LAB — LIPID PANEL
Cholesterol: 198 mg/dL (ref 0–200)
HDL: 57.3 mg/dL (ref >39.00)
LDL Cholesterol: 126 mg/dL — ABNORMAL HIGH (ref 0–99)
NONHDL: 140.7
Total CHOL/HDL Ratio: 3
Triglycerides: 72 mg/dL (ref 0.0–149.0)
VLDL: 14.4 mg/dL (ref 0.0–40.0)

## 2014-06-04 LAB — CBC WITH DIFFERENTIAL/PLATELET
BASOS ABS: 0 10*3/uL (ref 0.0–0.1)
BASOS PCT: 0.6 % (ref 0.0–3.0)
Eosinophils Absolute: 0.2 10*3/uL (ref 0.0–0.7)
Eosinophils Relative: 3.6 % (ref 0.0–5.0)
HCT: 49.7 % (ref 39.0–52.0)
HEMOGLOBIN: 16.8 g/dL (ref 13.0–17.0)
LYMPHS PCT: 24.7 % (ref 12.0–46.0)
Lymphs Abs: 1.7 10*3/uL (ref 0.7–4.0)
MCHC: 33.8 g/dL (ref 30.0–36.0)
MCV: 95 fl (ref 78.0–100.0)
Monocytes Absolute: 0.8 10*3/uL (ref 0.1–1.0)
Monocytes Relative: 11.2 % (ref 3.0–12.0)
NEUTROS ABS: 4.1 10*3/uL (ref 1.4–7.7)
Neutrophils Relative %: 59.9 % (ref 43.0–77.0)
Platelets: 241 10*3/uL (ref 150.0–400.0)
RBC: 5.23 Mil/uL (ref 4.22–5.81)
RDW: 12.8 % (ref 11.5–15.5)
WBC: 6.8 10*3/uL (ref 4.0–10.5)

## 2014-06-04 LAB — HEPATIC FUNCTION PANEL
ALBUMIN: 4.6 g/dL (ref 3.5–5.2)
ALK PHOS: 58 U/L (ref 39–117)
ALT: 31 U/L (ref 0–53)
AST: 31 U/L (ref 0–37)
Bilirubin, Direct: 0.1 mg/dL (ref 0.0–0.3)
Total Bilirubin: 0.7 mg/dL (ref 0.2–1.2)
Total Protein: 7.3 g/dL (ref 6.0–8.3)

## 2014-06-04 LAB — HEMOGLOBIN A1C: Hgb A1c MFr Bld: 5.8 % (ref 4.6–6.5)

## 2014-06-04 LAB — TSH: TSH: 1.72 u[IU]/mL (ref 0.35–4.50)

## 2014-06-30 ENCOUNTER — Ambulatory Visit (INDEPENDENT_AMBULATORY_CARE_PROVIDER_SITE_OTHER): Payer: Medicare Other

## 2014-06-30 DIAGNOSIS — Z23 Encounter for immunization: Secondary | ICD-10-CM

## 2014-07-15 ENCOUNTER — Ambulatory Visit (AMBULATORY_SURGERY_CENTER): Payer: Self-pay | Admitting: *Deleted

## 2014-07-15 VITALS — Ht 71.0 in | Wt 182.0 lb

## 2014-07-15 DIAGNOSIS — Z86010 Personal history of colonic polyps: Secondary | ICD-10-CM

## 2014-07-15 NOTE — Progress Notes (Signed)
Patient denies any allergies to eggs or soy. Patient denies any problems with sedation. Patient denies any oxygen use at home and does not take any diet/weight loss medications. EMMI education assisgned to patient on colonoscopy, this was explained and instructions given to patient. 

## 2014-07-29 ENCOUNTER — Ambulatory Visit (AMBULATORY_SURGERY_CENTER): Payer: BLUE CROSS/BLUE SHIELD | Admitting: Internal Medicine

## 2014-07-29 ENCOUNTER — Encounter: Payer: Self-pay | Admitting: Internal Medicine

## 2014-07-29 VITALS — BP 130/92 | HR 61 | Temp 96.4°F | Resp 19 | Ht 71.0 in | Wt 182.0 lb

## 2014-07-29 DIAGNOSIS — Z86010 Personal history of colonic polyps: Secondary | ICD-10-CM | POA: Diagnosis not present

## 2014-07-29 DIAGNOSIS — K5730 Diverticulosis of large intestine without perforation or abscess without bleeding: Secondary | ICD-10-CM | POA: Diagnosis not present

## 2014-07-29 DIAGNOSIS — I10 Essential (primary) hypertension: Secondary | ICD-10-CM | POA: Diagnosis not present

## 2014-07-29 MED ORDER — SODIUM CHLORIDE 0.9 % IV SOLN: 500.0000 mL | INTRAVENOUS | Status: DC

## 2014-07-29 NOTE — Patient Instructions (Addendum)
No polyps today! Your next routine colonoscopy should be in 5 years - 2021. This recommendation is based upon your history of polyps and the current expert guidelines.  I appreciate the opportunity to care for you. Gatha Mayer, MD, FACG  YOU HAD AN ENDOSCOPIC PROCEDURE TODAY AT [facility]: Refer to the procedure report that was given to you for any specific questions about what was found during the examination.  If the procedure report does not answer your questions, please call your gastroenterologist to clarify.  If you requested that your care partner not be given the details of your procedure findings, then the procedure report has been included in a sealed envelope for you to review at your convenience later.  YOU SHOULD EXPECT: Some feelings of bloating in the abdomen. Passage of more gas than usual.  Walking can help get rid of the air that was put into your GI tract during the procedure and reduce the bloating. If you had a lower endoscopy (such as a colonoscopy or flexible sigmoidoscopy) you may notice spotting of blood in your stool or on the toilet paper. If you underwent a bowel prep for your procedure, then you may not have a normal bowel movement for a few days.  DIET: Your first meal following the procedure should be a light meal and then it is ok to progress to your normal diet.  A half-sandwich or bowl of soup is an example of a good first meal.  Heavy or fried foods are harder to digest and may make you feel nauseous or bloated.  Likewise meals heavy in dairy and vegetables can cause extra gas to form and this can also increase the bloating.  Drink plenty of fluids but you should avoid alcoholic beverages for 24 hours.  ACTIVITY: Your care partner should take you home directly after the procedure.  You should plan to take it easy, moving slowly for the rest of the day.  You can resume normal activity the day after the procedure however you should NOT DRIVE  or use heavy machinery for 24 hours (because of the sedation medicines used during the test).    SYMPTOMS TO REPORT IMMEDIATELY: A gastroenterologist can be reached at any hour.  During normal business hours, 8:30 AM to 5:00 PM Monday through Friday, call [phone].  After hours and on weekends, please call the GI answering service at [phone] who will take a message and have the physician on call contact you.   Following lower endoscopy (colonoscopy or flexible sigmoidoscopy):  Excessive amounts of blood in the stool  Significant tenderness or worsening of abdominal pains  Swelling of the abdomen that is new, acute  Fever of 100F or higher  FOLLOW UP: If any biopsies were taken you will be contacted by phone or by letter within the next 1-3 weeks.  Call your gastroenterologist if you have not heard about the biopsies in 3 weeks.  Our staff will call the home number listed on your records the next business day following your procedure to check on you and address any questions or concerns that you may have at that time regarding the information given to you following your procedure. This is a courtesy call and so if there is no answer at the home number and we have not heard from you through the emergency physician on call, we will assume that you have returned to your regular daily activities without incident.  SIGNATURES/CONFIDENTIALITY: You and/or your care  partner have signed paperwork which will be entered into your electronic medical record.  These signatures attest to the fact that that the information above on your After Visit Summary has been reviewed and is understood.  Full responsibility of the confidentiality of this discharge information lies with you and/or your care-partner.  Recommendations Discharge instructions given to patient and/or care partner. Diverticulosis handout provided.

## 2014-07-29 NOTE — Progress Notes (Signed)
PT. Stated he did not take blood pressure medication, denies s/s.

## 2014-07-29 NOTE — Progress Notes (Signed)
Report to PACU, RN, vss, BBS= Clear.  

## 2014-07-29 NOTE — Op Note (Signed)
[place]  [address] [place], [id number]   COLONOSCOPY PROCEDURE REPORT  PATIENT: [name] [name], [name] [name]  MR#: [id number] BIRTHDATE: February 11, 1951 , 24  yrs. old GENDER: male ENDOSCOPIST: Gatha Mayer, MD, [facility] PROCEDURE DATE:  07/29/2014 PROCEDURE:   Colonoscopy, surveillance First Screening Colonoscopy - Avg.  risk and is 50 yrs.  old or older - No.  Prior Negative Screening - Now for repeat screening. N/A  History of Adenoma - Now for follow-up colonoscopy & has been > or = to 3 yrs.  Yes hx of adenoma.  Has been 3 or more years since last colonoscopy.  Polyps Removed Today? No.  Polyps Removed Today? No.  Recommend repeat exam, <10 yrs? Polyps Removed Today? No.  Recommend repeat exam, <10 yrs? No. ASA CLASS:   Class II INDICATIONS:high risk patient with personal history of colonic polyps. MEDICATIONS: Propofol 200 mg IV and Monitored anesthesia care  DESCRIPTION OF PROCEDURE:   After the risks benefits and alternatives of the procedure were thoroughly explained, informed consent was obtained.  The digital rectal exam revealed no abnormalities of the rectum, revealed the prostate was not enlarged, and revealed no prostatic nodules.   The [id number]  endoscope was introduced through the anus and advanced to the cecum, which was identified by both the appendix and ileocecal valve. No adverse events experienced.   The quality of the prep was excellent, using MiraLax  The instrument was then slowly withdrawn as the colon was fully examined.      COLON FINDINGS: 1) Mild sigmoid diverticulosis 2) Otherwise normal colonoscopy with excellent prep - hx adenomas 2010 (max 18mm). Retroflexed views revealed no abnormalities. The time to cecum=2 minutes 04 seconds.  Withdrawal time=8 minutes 29 seconds.  The scope was withdrawn and the procedure completed. COMPLICATIONS: There were no immediate complications.  ENDOSCOPIC IMPRESSION: 1) Mild sigmoid  diverticulosis 2) Otherwise normal colonoscopy with excellent prep - hx adenomas/TV adenoma 2010 (max 11mm)  RECOMMENDATIONS: Repeat Colonoscopy in 5 years.  eSigned:  Gatha Mayer, MD, [facility] 07/29/2014 12:06 PM   cc: The Patient

## 2014-07-30 ENCOUNTER — Telehealth: Payer: Self-pay | Admitting: *Deleted

## 2014-07-30 NOTE — Telephone Encounter (Signed)
Left message on f/u callback 

## 2015-06-04 ENCOUNTER — Encounter: Payer: Self-pay | Admitting: Internal Medicine

## 2015-06-04 ENCOUNTER — Ambulatory Visit (INDEPENDENT_AMBULATORY_CARE_PROVIDER_SITE_OTHER): Payer: Commercial Indemnity | Admitting: Internal Medicine

## 2015-06-04 VITALS — BP 136/78 | HR 96 | Temp 98.8°F | Resp 18 | Wt 187.0 lb

## 2015-06-04 DIAGNOSIS — Z139 Encounter for screening, unspecified: Secondary | ICD-10-CM

## 2015-06-04 DIAGNOSIS — Z23 Encounter for immunization: Secondary | ICD-10-CM

## 2015-06-04 DIAGNOSIS — Z0000 Encounter for general adult medical examination without abnormal findings: Secondary | ICD-10-CM

## 2015-06-04 DIAGNOSIS — I10 Essential (primary) hypertension: Secondary | ICD-10-CM

## 2015-06-04 DIAGNOSIS — E785 Hyperlipidemia, unspecified: Secondary | ICD-10-CM

## 2015-06-04 DIAGNOSIS — R7303 Prediabetes: Secondary | ICD-10-CM | POA: Diagnosis not present

## 2015-06-04 DIAGNOSIS — F17200 Nicotine dependence, unspecified, uncomplicated: Secondary | ICD-10-CM

## 2015-06-04 DIAGNOSIS — R739 Hyperglycemia, unspecified: Secondary | ICD-10-CM

## 2015-06-04 MED ORDER — LISINOPRIL-HYDROCHLOROTHIAZIDE 20-12.5 MG PO TABS: 1.0000 | ORAL_TABLET | Freq: Every day | ORAL | Status: DC

## 2015-06-04 NOTE — Progress Notes (Signed)
Pre visit review using our clinic review tool, if applicable. No additional management support is needed unless otherwise documented below in the visit note. 

## 2015-06-04 NOTE — Assessment & Plan Note (Signed)
History of hyperlipidemia, has never been on medication He is doing some exercise-encouraged regular exercise Continue healthy diet Check lipid panel

## 2015-06-04 NOTE — Patient Instructions (Signed)
[name] [name] , Thank you for taking time to come for your Medicare Wellness Visit. I appreciate your ongoing commitment to your health goals. Please review the following plan we discussed and let me know if I can assist you in the future.   These are the goals we discussed: Goals    Think about quitting smoking and continue to exercise regularly      This is a list of the screening recommended for you and due dates:  Health Maintenance  Topic Date Due  .  Hepatitis C: One time screening is recommended by Center for Disease Control  (CDC) for  adults born from [birth year] through 1967.   September 18, 1950  . Shingles Vaccine  09/29/2008  . Pneumonia vaccines (2 of 2 - PPSV23) 07/01/2015  . Flu Shot  01/18/2016  . Tetanus Vaccine  05/12/2019  . Colon Cancer Screening  07/30/2019   Health Maintenance, Male A healthy lifestyle and preventative care can promote health and wellness.  Maintain regular health, dental, and eye exams.  Eat a healthy diet. Foods like vegetables, fruits, whole grains, low-fat dairy products, and lean protein foods contain the nutrients you need and are low in calories. Decrease your intake of foods high in solid fats, added sugars, and salt. Get information about a proper diet from your health care provider, if necessary.  Regular physical exercise is one of the most important things you can do for your health. Most adults should get at least 150 minutes of moderate-intensity exercise (any activity that increases your heart rate and causes you to sweat) each week. In addition, most adults need muscle-strengthening exercises on 2 or more days a week.   Maintain a healthy weight. The body mass index (BMI) is a screening tool to identify possible weight problems. It provides an estimate of body fat based on height and weight. Your health care provider can find your BMI and can help you achieve or maintain a healthy weight. For males 20 years and older:  A BMI below 18.5 is  considered underweight.  A BMI of 18.5 to 24.9 is normal.  A BMI of 25 to 29.9 is considered overweight.  A BMI of 30 and above is considered obese.  Maintain normal blood lipids and cholesterol by exercising and minimizing your intake of saturated fat. Eat a balanced diet with plenty of fruits and vegetables. Blood tests for lipids and cholesterol should begin at age 46 and be repeated every 5 years. If your lipid or cholesterol levels are high, you are over age 35, or you are at high risk for heart disease, you may need your cholesterol levels checked more frequently.Ongoing high lipid and cholesterol levels should be treated with medicines if diet and exercise are not working.  If you smoke, find out from your health care provider how to quit. If you do not use tobacco, do not start.  Lung cancer screening is recommended for adults aged 74-80 years who are at high risk for developing lung cancer because of a history of smoking. A yearly low-dose CT scan of the lungs is recommended for people who have at least a 30-pack-year history of smoking and are current smokers or have quit within the past 15 years. A pack year of smoking is smoking an average of 1 pack of cigarettes a day for 1 year (for example, a 30-pack-year history of smoking could mean smoking 1 pack a day for 30 years or 2 packs a day for 15 years). Yearly screening should  continue until the smoker has stopped smoking for at least 15 years. Yearly screening should be stopped for people who develop a health problem that would prevent them from having lung cancer treatment.  If you choose to drink alcohol, do not have more than 2 drinks per day. One drink is considered to be 12 oz (360 mL) of beer, 5 oz (150 mL) of wine, or 1.5 oz (45 mL) of liquor.  Avoid the use of street drugs. Do not share needles with anyone. Ask for help if you need support or instructions about stopping the use of drugs.  High blood pressure causes heart  disease and increases the risk of stroke. High blood pressure is more likely to develop in:  People who have blood pressure in the end of the normal range (100-139/85-89 mm Hg).  People who are overweight or obese.  People who are African American.  If you are 62-46 years of age, have your blood pressure checked every 3-5 years. If you are 31 years of age or older, have your blood pressure checked every year. You should have your blood pressure measured twice--once when you are at a hospital or clinic, and once when you are not at a hospital or clinic. Record the average of the two measurements. To check your blood pressure when you are not at a hospital or clinic, you can use:  An automated blood pressure machine at a pharmacy.  A home blood pressure monitor.  If you are 70-16 years old, ask your health care provider if you should take aspirin to prevent heart disease.  Diabetes screening involves taking a blood sample to check your fasting blood sugar level. This should be done once every 3 years after age 15 if you are at a normal weight and without risk factors for diabetes. Testing should be considered at a younger age or be carried out more frequently if you are overweight and have at least 1 risk factor for diabetes.  Colorectal cancer can be detected and often prevented. Most routine colorectal cancer screening begins at the age of 79 and continues through age 28. However, your health care provider may recommend screening at an earlier age if you have risk factors for colon cancer. On a yearly basis, your health care provider may provide home test kits to check for hidden blood in the stool. A small camera at the end of a tube may be used to directly examine the colon (sigmoidoscopy or colonoscopy) to detect the earliest forms of colorectal cancer. Talk to your health care provider about this at age 50 when routine screening begins. A direct exam of the colon should be repeated every 5-10  years through age 37, unless early forms of precancerous polyps or small growths are found.  People who are at an increased risk for hepatitis B should be screened for this virus. You are considered at high risk for hepatitis B if:  You were born in a country where hepatitis B occurs often. Talk with your health care provider about which countries are considered high risk.  Your parents were born in a high-risk country and you have not received a shot to protect against hepatitis B (hepatitis B vaccine).  You have HIV or AIDS.  You use needles to inject street drugs.  You live with, or have sex with, someone who has hepatitis B.  You are a man who has sex with other men (MSM).  You get hemodialysis treatment.  You take certain medicines  for conditions like cancer, organ transplantation, and autoimmune conditions.  Hepatitis C blood testing is recommended for all people born from 50 through 1965 and any individual with known risk factors for hepatitis C.  Healthy men should no longer receive prostate-specific antigen (PSA) blood tests as part of routine cancer screening. Talk to your health care provider about prostate cancer screening.  Testicular cancer screening is not recommended for adolescents or adult males who have no symptoms. Screening includes self-exam, a health care provider exam, and other screening tests. Consult with your health care provider about any symptoms you have or any concerns you have about testicular cancer.  Practice safe sex. Use condoms and avoid high-risk sexual practices to reduce the spread of sexually transmitted infections (STIs).  You should be screened for STIs, including gonorrhea and chlamydia if:  You are sexually active and are younger than 24 years.  You are older than 24 years, and your health care provider tells you that you are at risk for this type of infection.  Your sexual activity has changed since you were last screened, and you are  at an increased risk for chlamydia or gonorrhea. Ask your health care provider if you are at risk.  If you are at risk of being infected with HIV, it is recommended that you take a prescription medicine daily to prevent HIV infection. This is called pre-exposure prophylaxis (PrEP). You are considered at risk if:  You are a man who has sex with other men (MSM).  You are a heterosexual man who is sexually active with multiple partners.  You take drugs by injection.  You are sexually active with a partner who has HIV.  Talk with your health care provider about whether you are at high risk of being infected with HIV. If you choose to begin PrEP, you should first be tested for HIV. You should then be tested every 3 months for as long as you are taking PrEP.  Use sunscreen. Apply sunscreen liberally and repeatedly throughout the day. You should seek shade when your shadow is shorter than you. Protect yourself by wearing long sleeves, pants, a wide-brimmed hat, and sunglasses year round whenever you are outdoors.  Tell your health care provider of new moles or changes in moles, especially if there is a change in shape or color. Also, tell your health care provider if a mole is larger than the size of a pencil eraser.  A one-time screening for abdominal aortic aneurysm (AAA) and surgical repair of large AAAs by ultrasound is recommended for men aged 21-75 years who are current or former smokers.  Stay current with your vaccines (immunizations).   This information is not intended to replace advice given to you by your health care provider. Make sure you discuss any questions you have with your health care provider.   Document Released: 12/02/2007 Document Revised: 06/26/2014 Document Reviewed: 10/31/2010 Elsevier Interactive Patient Education [employer].

## 2015-06-04 NOTE — Assessment & Plan Note (Signed)
BP Readings from Last 3 Encounters:  06/04/15 136/78  07/29/14 130/92  06/02/14 138/90   Blood pressure appears controlled Continue current medication Check basic blood work-CMP Follow-up annually

## 2015-06-04 NOTE — Assessment & Plan Note (Signed)
History of elevated sugars Will check A1c Continue regular exercise and healthy diet-he is compliant with a low sugar diet

## 2015-06-04 NOTE — Assessment & Plan Note (Signed)
Encouraged smoking cessation His wife currently smokes and he knows he could not quit while she smokes. Neither of them are interested in quitting Advised him to return if he is interested in quitting

## 2015-06-04 NOTE — Progress Notes (Signed)
Subjective:    Patient ID: [name] [name], male    DOB: 1950/08/14, 64 y.o.   MRN: [id number]  HPI He is here to establish with a new pcp and for a PE.  Here for medicare wellness.   I have personally reviewed and have noted 1.The patient's medical and social history 2.Their use of alcohol, tobacco or illicit drugs 3.Their current medications and supplements 4.The patient's functional ability including ADL's, fall risks, home safety risks and  hearing or visual impairment. 5.Diet and physical activities 6.Evidence for depression or mood disorders 7.Care team reviewed; does not follow with anyone on a regular basis   Are there smokers in your home (other than you)? Yes, his wife  Risk Factors Exercise: walking dog daily Dietary issues discussed: eats healthy, no sweets  Cardiac risk factors: advanced age, hypertension, hyperlipidemia  Depression Screen  Have you felt down, depressed or hopeless? No  Have you felt little interest or pleasure in doing things?  No Activities of Daily Living In your present state of health, do you have any difficulty performing the following activities?:  Driving? Yes Managing money?  Yes Feeding yourself? Yes Getting from bed to chair? Yes Climbing a flight of stairs? Yes Preparing food and eating?: Yes Bathing or showering? Yes Getting dressed: Yes Getting to/using the toilet? Yes Moving around from place to place: Yes In the past year have you fallen or had a near fall?:  No    Are you sexually active?  no  Do you have more than one partner?  N/A  Hearing Difficulties:  Do you often ask people to speak up or repeat themselves? No Do you experience ringing or noises in your ears? No Do you have difficulty understanding soft or whispered voices? No Vision:              Any change in vision: no              Up to date with eye exam: yes Memory:  Do you feel that you  have a problem with memory? No  Do you often misplace items? No  Do you feel safe at home?  Yes  Cognitive Testing  Alert, Orientated? Yes  Normal Appearance? Yes  Recall of three objects?  Yes  Can perform simple calculations? Yes  Displays appropriate judgment? Yes  Can read the correct time from a watch face? Yes   Advanced Directives have been discussed with the patient? Yes   Medications and allergies reviewed with patient and no updates were needed.  Patient Active Problem List   Diagnosis Date Noted  . History of colonic polyps 06/02/2014  . Near syncope 07/10/2013  . Pre-diabetes 02/24/2013  . Hyperlipidemia 08/05/2009  . SMOKER 05/11/2009  . BPH without obstruction/lower urinary tract symptoms 05/11/2009  . Essential hypertension 04/08/2007    Current Outpatient Prescriptions on File Prior to Visit  Medication Sig Dispense Refill  . aspirin 81 MG tablet Take 81 mg by mouth daily.       No current facility-administered medications on file prior to visit.    Past Medical History  Diagnosis Date  . Hypertension     Past Surgical History  Procedure Laterality Date  . Colonoscopy    . Wisdom tooth extraction      Social History   Social History  . Marital Status: Married    Spouse Name: N/A  . Number of Children: N/A  . Years of Education: N/A   Social History Main  Topics  . Smoking status: Current Every Day Smoker -- 0.25 packs/day    Types: Cigarettes  . Smokeless tobacco: Never Used     Comment: 06/02/14 2-3 pp week  . Alcohol Use: 1.2 oz/week    2 Glasses of wine per week     Comment:  occasionally, not daily  . Drug Use: No  . Sexual Activity: Not Asked   Other Topics Concern  . None   Social History Narrative   Walks dog every day - dog is older    Review of Systems  Constitutional: Negative for fever and chills.  HENT: Negative for congestion, hearing loss and sore throat.   Eyes: Negative for visual disturbance.  Respiratory:  Negative for cough, shortness of breath and wheezing.   Cardiovascular: Negative for chest pain, palpitations and leg swelling.  Gastrointestinal: Negative for nausea, abdominal pain, diarrhea, constipation and blood in stool.       No gerd  Genitourinary: Negative for dysuria, hematuria and difficulty urinating.  Musculoskeletal: Negative for back pain and arthralgias.  Neurological: Negative for dizziness, weakness, light-headedness, numbness and headaches.  Psychiatric/Behavioral: Negative for sleep disturbance and dysphoric mood. The patient is not nervous/anxious.        Objective:   Filed Vitals:   06/04/15 1029  BP: 136/78  Pulse: 96  Temp: 98.8 F (37.1 C)  Resp: 18   Filed Weights   06/04/15 1029  Weight: 187 lb (84.823 kg)   Body mass index is 26.09 kg/(m^2).   Physical Exam  Constitutional: He is oriented to person, place, and time. He appears well-developed and well-nourished. No distress.  HENT:  Head: Normocephalic and atraumatic.  Right Ear: External ear normal.  Left Ear: External ear normal.  Normal ear canals and TM normal  Eyes: Conjunctivae are normal.  Neck: Neck supple. No tracheal deviation present. No thyromegaly present.  Cardiovascular: Normal rate, regular rhythm and normal heart sounds.   No murmur heard. Pulmonary/Chest: Effort normal and breath sounds normal. No respiratory distress. He has no wheezes. He has no rales.  Abdominal: Soft. He exhibits no distension. There is no tenderness.  Musculoskeletal: He exhibits no edema.  Lymphadenopathy:    He has no cervical adenopathy.  Neurological: He is alert and oriented to person, place, and time.  Skin: Skin is warm and dry. No rash noted. He is not diaphoretic.  Psychiatric: He has a normal mood and affect. His behavior is normal.          Assessment & Plan:   Wellness visit: Screening blood work ordered, including hepatitis C when he consented to Declined prostate cancer  screening Flu shot today Discussed shingles vaccine-he declined Other immunizations up-to-date Colonoscopy up-to-date He is very active and he is doing some exercise and encouraged him to continue regular exercise On exam up-to-date Wellness screenings completed-see above. No concerns No evidence of substance abuse Discussed smoking cessation-he is not interested in quitting at this time, but I recommended that he return if he is interested to discuss further. Encouraged smoking cessation No EKG today    See Problem List for A/P of chronic medical problems.

## 2015-06-07 ENCOUNTER — Other Ambulatory Visit (INDEPENDENT_AMBULATORY_CARE_PROVIDER_SITE_OTHER): Payer: Medicare Other

## 2015-06-07 DIAGNOSIS — E785 Hyperlipidemia, unspecified: Secondary | ICD-10-CM

## 2015-06-07 DIAGNOSIS — R7303 Prediabetes: Secondary | ICD-10-CM | POA: Diagnosis not present

## 2015-06-07 DIAGNOSIS — R739 Hyperglycemia, unspecified: Secondary | ICD-10-CM | POA: Diagnosis not present

## 2015-06-07 DIAGNOSIS — Z0189 Encounter for other specified special examinations: Secondary | ICD-10-CM | POA: Diagnosis not present

## 2015-06-07 DIAGNOSIS — Z0000 Encounter for general adult medical examination without abnormal findings: Secondary | ICD-10-CM | POA: Diagnosis not present

## 2015-06-07 DIAGNOSIS — I10 Essential (primary) hypertension: Secondary | ICD-10-CM

## 2015-06-07 LAB — CBC WITH DIFFERENTIAL/PLATELET
Basophils Absolute: 0 K/uL (ref 0.0–0.1)
Basophils Relative: 0.6 % (ref 0.0–3.0)
Eosinophils Absolute: 0.2 K/uL (ref 0.0–0.7)
Eosinophils Relative: 3.2 % (ref 0.0–5.0)
HCT: 46.9 % (ref 39.0–52.0)
Hemoglobin: 16 g/dL (ref 13.0–17.0)
Lymphocytes Relative: 23.3 % (ref 12.0–46.0)
Lymphs Abs: 1.8 K/uL (ref 0.7–4.0)
MCHC: 34.1 g/dL (ref 30.0–36.0)
MCV: 93.6 fl (ref 78.0–100.0)
Monocytes Absolute: 0.7 K/uL (ref 0.1–1.0)
Monocytes Relative: 8.8 % (ref 3.0–12.0)
Neutro Abs: 4.8 K/uL (ref 1.4–7.7)
Neutrophils Relative %: 64.1 % (ref 43.0–77.0)
Platelets: 260 K/uL (ref 150.0–400.0)
RBC: 5.01 Mil/uL (ref 4.22–5.81)
RDW: 12.5 % (ref 11.5–15.5)
WBC: 7.6 K/uL (ref 4.0–10.5)

## 2015-06-07 LAB — LIPID PANEL
Cholesterol: 219 mg/dL — ABNORMAL HIGH (ref 0–200)
HDL: 57.5 mg/dL (ref >39.00)
LDL Cholesterol: 142 mg/dL — ABNORMAL HIGH (ref 0–99)
NonHDL: 161.51
Total CHOL/HDL Ratio: 4
Triglycerides: 100 mg/dL (ref 0.0–149.0)
VLDL: 20 mg/dL (ref 0.0–40.0)

## 2015-06-07 LAB — COMPREHENSIVE METABOLIC PANEL WITH GFR
ALT: 19 U/L (ref 0–53)
AST: 19 U/L (ref 0–37)
Albumin: 4.1 g/dL (ref 3.5–5.2)
Alkaline Phosphatase: 49 U/L (ref 39–117)
BUN: 18 mg/dL (ref 6–23)
CO2: 29 meq/L (ref 19–32)
Calcium: 9.9 mg/dL (ref 8.4–10.5)
Chloride: 103 meq/L (ref 96–112)
Creatinine, Ser: 1.05 mg/dL (ref 0.40–1.50)
GFR: 74.95 mL/min (ref >60.00)
Glucose, Bld: 114 mg/dL — ABNORMAL HIGH (ref 70–99)
Potassium: 4.8 meq/L (ref 3.5–5.1)
Sodium: 139 meq/L (ref 135–145)
Total Bilirubin: 0.4 mg/dL (ref 0.2–1.2)
Total Protein: 6.8 g/dL (ref 6.0–8.3)

## 2015-06-07 LAB — TSH: TSH: 1.39 u[IU]/mL (ref 0.35–4.50)

## 2015-06-07 LAB — HEMOGLOBIN A1C: HEMOGLOBIN A1C: 5.8 % (ref 4.6–6.5)

## 2015-06-08 LAB — HEPATITIS C ANTIBODY: HCV AB: NEGATIVE

## 2015-10-21 ENCOUNTER — Encounter: Payer: Self-pay | Admitting: Internal Medicine

## 2016-05-20 ENCOUNTER — Other Ambulatory Visit: Payer: Self-pay | Admitting: Internal Medicine

## 2016-05-20 DIAGNOSIS — I10 Essential (primary) hypertension: Secondary | ICD-10-CM

## 2016-05-22 ENCOUNTER — Encounter: Payer: Self-pay | Admitting: Internal Medicine

## 2016-05-22 ENCOUNTER — Ambulatory Visit (INDEPENDENT_AMBULATORY_CARE_PROVIDER_SITE_OTHER): Payer: Medicare Other | Admitting: Internal Medicine

## 2016-05-22 VITALS — BP 140/90 | HR 73 | Ht 71.5 in | Wt 183.0 lb

## 2016-05-22 DIAGNOSIS — F17200 Nicotine dependence, unspecified, uncomplicated: Secondary | ICD-10-CM

## 2016-05-22 DIAGNOSIS — I10 Essential (primary) hypertension: Secondary | ICD-10-CM

## 2016-05-22 DIAGNOSIS — E785 Hyperlipidemia, unspecified: Secondary | ICD-10-CM

## 2016-05-22 DIAGNOSIS — Z23 Encounter for immunization: Secondary | ICD-10-CM

## 2016-05-22 DIAGNOSIS — R7303 Prediabetes: Secondary | ICD-10-CM | POA: Diagnosis not present

## 2016-05-22 DIAGNOSIS — N400 Enlarged prostate without lower urinary tract symptoms: Secondary | ICD-10-CM

## 2016-05-22 DIAGNOSIS — Z0000 Encounter for general adult medical examination without abnormal findings: Secondary | ICD-10-CM | POA: Diagnosis not present

## 2016-05-22 DIAGNOSIS — Z125 Encounter for screening for malignant neoplasm of prostate: Secondary | ICD-10-CM

## 2016-05-22 MED ORDER — LISINOPRIL-HYDROCHLOROTHIAZIDE 20-12.5 MG PO TABS: 1.0000 | ORAL_TABLET | Freq: Every day | ORAL | 3 refills | Status: DC

## 2016-05-22 NOTE — Assessment & Plan Note (Signed)
Check lipids 

## 2016-05-22 NOTE — Assessment & Plan Note (Signed)
Check a1c 

## 2016-05-22 NOTE — Assessment & Plan Note (Signed)
Check psa 

## 2016-05-22 NOTE — Patient Instructions (Signed)
Test(s) ordered today. Your results will be released to [place] (or called to you) after review, usually within 72hours after test completion. If any changes need to be made, you will be notified at that same time.  All other Health Maintenance issues reviewed.   All recommended immunizations and age-appropriate screenings are up-to-date or discussed.  Flu vaccine administered today.   Medications reviewed and updated.  No changes recommended at this time.  Your prescription(s) have been submitted to your pharmacy. Please take as directed and contact our office if you believe you are having problem(s) with the medication(s).   Please followup in one year  Health Maintenance, Male A healthy lifestyle and preventative care can promote health and wellness.  Maintain regular health, dental, and eye exams.  Eat a healthy diet. Foods like vegetables, fruits, whole grains, low-fat dairy products, and lean protein foods contain the nutrients you need and are low in calories. Decrease your intake of foods high in solid fats, added sugars, and salt. Get information about a proper diet from your health care provider, if necessary.  Regular physical exercise is one of the most important things you can do for your health. Most adults should get at least 150 minutes of moderate-intensity exercise (any activity that increases your heart rate and causes you to sweat) each week. In addition, most adults need muscle-strengthening exercises on 2 or more days a week.   Maintain a healthy weight. The body mass index (BMI) is a screening tool to identify possible weight problems. It provides an estimate of body fat based on height and weight. Your health care provider can find your BMI and can help you achieve or maintain a healthy weight. For males 20 years and older:  A BMI below 18.5 is considered underweight.  A BMI of 18.5 to 24.9 is normal.  A BMI of 25 to 29.9 is considered overweight.  A BMI of 30  and above is considered obese.  Maintain normal blood lipids and cholesterol by exercising and minimizing your intake of saturated fat. Eat a balanced diet with plenty of fruits and vegetables. Blood tests for lipids and cholesterol should begin at age 76 and be repeated every 5 years. If your lipid or cholesterol levels are high, you are over age 59, or you are at high risk for heart disease, you may need your cholesterol levels checked more frequently.Ongoing high lipid and cholesterol levels should be treated with medicines if diet and exercise are not working.  If you smoke, find out from your health care provider how to quit. If you do not use tobacco, do not start.  Lung cancer screening is recommended for adults aged 48-80 years who are at high risk for developing lung cancer because of a history of smoking. A yearly low-dose CT scan of the lungs is recommended for people who have at least a 30-pack-year history of smoking and are current smokers or have quit within the past 15 years. A pack year of smoking is smoking an average of 1 pack of cigarettes a day for 1 year (for example, a 30-pack-year history of smoking could mean smoking 1 pack a day for 30 years or 2 packs a day for 15 years). Yearly screening should continue until the smoker has stopped smoking for at least 15 years. Yearly screening should be stopped for people who develop a health problem that would prevent them from having lung cancer treatment.  If you choose to drink alcohol, do not have more than  2 drinks per day. One drink is considered to be 12 oz (360 mL) of beer, 5 oz (150 mL) of wine, or 1.5 oz (45 mL) of liquor.  Avoid the use of street drugs. Do not share needles with anyone. Ask for help if you need support or instructions about stopping the use of drugs.  High blood pressure causes heart disease and increases the risk of stroke. High blood pressure is more likely to develop in:  People who have blood pressure in  the end of the normal range (100-139/85-89 mm Hg).  People who are overweight or obese.  People who are African American.  If you are 79-49 years of age, have your blood pressure checked every 3-5 years. If you are 19 years of age or older, have your blood pressure checked every year. You should have your blood pressure measured twice-once when you are at a hospital or clinic, and once when you are not at a hospital or clinic. Record the average of the two measurements. To check your blood pressure when you are not at a hospital or clinic, you can use:  An automated blood pressure machine at a pharmacy.  A home blood pressure monitor.  If you are 58-45 years old, ask your health care provider if you should take aspirin to prevent heart disease.  Diabetes screening involves taking a blood sample to check your fasting blood sugar level. This should be done once every 3 years after age 83 if you are at a normal weight and without risk factors for diabetes. Testing should be considered at a younger age or be carried out more frequently if you are overweight and have at least 1 risk factor for diabetes.  Colorectal cancer can be detected and often prevented. Most routine colorectal cancer screening begins at the age of 32 and continues through age 60. However, your health care provider may recommend screening at an earlier age if you have risk factors for colon cancer. On a yearly basis, your health care provider may provide home test kits to check for hidden blood in the stool. A small camera at the end of a tube may be used to directly examine the colon (sigmoidoscopy or colonoscopy) to detect the earliest forms of colorectal cancer. Talk to your health care provider about this at age 42 when routine screening begins. A direct exam of the colon should be repeated every 5-10 years through age 52, unless early forms of precancerous polyps or small growths are found.  People who are at an increased risk  for hepatitis B should be screened for this virus. You are considered at high risk for hepatitis B if:  You were born in a country where hepatitis B occurs often. Talk with your health care provider about which countries are considered high risk.  Your parents were born in a high-risk country and you have not received a shot to protect against hepatitis B (hepatitis B vaccine).  You have HIV or AIDS.  You use needles to inject street drugs.  You live with, or have sex with, someone who has hepatitis B.  You are a man who has sex with other men (MSM).  You get hemodialysis treatment.  You take certain medicines for conditions like cancer, organ transplantation, and autoimmune conditions.  Hepatitis C blood testing is recommended for all people born from [birth year] through 1967 and any individual with known risk factors for hepatitis C.  Healthy men should no longer receive prostate-specific antigen (PSA) blood  tests as part of routine cancer screening. Talk to your health care provider about prostate cancer screening.  Testicular cancer screening is not recommended for adolescents or adult males who have no symptoms. Screening includes self-exam, a health care provider exam, and other screening tests. Consult with your health care provider about any symptoms you have or any concerns you have about testicular cancer.  Practice safe sex. Use condoms and avoid high-risk sexual practices to reduce the spread of sexually transmitted infections (STIs).  You should be screened for STIs, including gonorrhea and chlamydia if:  You are sexually active and are younger than 24 years.  You are older than 24 years, and your health care provider tells you that you are at risk for this type of infection.  Your sexual activity has changed since you were last screened, and you are at an increased risk for chlamydia or gonorrhea. Ask your health care provider if you are at risk.  If you are at risk of being  infected with HIV, it is recommended that you take a prescription medicine daily to prevent HIV infection. This is called pre-exposure prophylaxis (PrEP). You are considered at risk if:  You are a man who has sex with other men (MSM).  You are a heterosexual man who is sexually active with multiple partners.  You take drugs by injection.  You are sexually active with a partner who has HIV.  Talk with your health care provider about whether you are at high risk of being infected with HIV. If you choose to begin PrEP, you should first be tested for HIV. You should then be tested every 3 months for as long as you are taking PrEP.  Use sunscreen. Apply sunscreen liberally and repeatedly throughout the day. You should seek shade when your shadow is shorter than you. Protect yourself by wearing long sleeves, pants, a wide-brimmed hat, and sunglasses year round whenever you are outdoors.  Tell your health care provider of new moles or changes in moles, especially if there is a change in shape or color. Also, tell your health care provider if a mole is larger than the size of a pencil eraser.  A one-time screening for abdominal aortic aneurysm (AAA) and surgical repair of large AAAs by ultrasound is recommended for men aged 28-75 years who are current or former smokers.  Stay current with your vaccines (immunizations). This information is not intended to replace advice given to you by your health care provider. Make sure you discuss any questions you have with your health care provider. Document Released: 12/02/2007 Document Revised: 06/26/2014 Document Reviewed: 03/09/2015 Elsevier Interactive Patient Education  2017 [employer].

## 2016-05-22 NOTE — Assessment & Plan Note (Signed)
Does not wish to stopped Advised cessation

## 2016-05-22 NOTE — Progress Notes (Signed)
Subjective:    Patient ID: [name] [name], male    DOB: 06/14/1951, 64 y.o.   MRN: [id number]  HPI He is here for a physical exam.   Hypertension: He is taking his medication daily. He is compliant with a low sodium diet.  He denies chest pain, palpitations, edema, shortness of breath and regular headaches. He is exercising some - walking.  He does not monitor his blood pressure at home.    He is still smoking.  He typically smokes when he is bored.  He does not want to stop.  At most he smokes 1/2 ppd, some days he does not smoke at all.     Medications and allergies reviewed with patient and updated if appropriate.  Patient Active Problem List   Diagnosis Date Noted  . History of colonic polyps 06/02/2014  . Near syncope 07/10/2013  . Pre-diabetes 02/24/2013  . Hyperlipidemia 08/05/2009  . SMOKER 05/11/2009  . BPH without obstruction/lower urinary tract symptoms 05/11/2009  . Essential hypertension 04/08/2007     Medication List    aspirin 81 MG tablet Take 81 mg by mouth daily.   lisinopril-hydrochlorothiazide 20-12.5 MG tablet Commonly known as:  PRINZIDE,ZESTORETIC Take 1 tablet by mouth daily.      Past Medical History:  Diagnosis Date  . Hypertension     Past Surgical History:  Procedure Laterality Date  . COLONOSCOPY    . WISDOM TOOTH EXTRACTION      Social History   Social History  . Marital status: Married    Spouse name: N/A  . Number of children: N/A  . Years of education: N/A   Social History Main Topics  . Smoking status: Current Every Day Smoker    Packs/day: 0.25    Types: Cigarettes  . Smokeless tobacco: Never Used     Comment: 06/02/14 2-3 pp week  . Alcohol use 1.2 oz/week    2 Glasses of wine per week     Comment:  occasionally, not daily  . Drug use: No  . Sexual activity: Not Asked   Other Topics Concern  . None   Social History Narrative   Walks for exercise    Family History  Problem Relation Age of Onset  . Cancer  Mother     bone cancer  . Heart attack Father 67  . Cancer Sister     bone cancer  . Heart attack Paternal Uncle     X2, both < 55  . COPD Neg Hx   . Diabetes Neg Hx   . Stroke Neg Hx   . Colon cancer Neg Hx     Review of Systems  Constitutional: Negative for appetite change, chills, fatigue, fever and unexpected weight change.  HENT: Negative for hearing loss.   Eyes: Negative for visual disturbance.  Respiratory: Negative for cough, shortness of breath and wheezing.   Cardiovascular: Negative for chest pain, palpitations and leg swelling.  Gastrointestinal: Negative for abdominal pain, blood in stool, constipation, diarrhea and nausea.       No gerd  Genitourinary: Negative for difficulty urinating, dysuria and hematuria.  Musculoskeletal: Negative for arthralgias, back pain and myalgias.  Skin: Negative for color change and rash.  Neurological: Negative for light-headedness and headaches.  Psychiatric/Behavioral: Negative for dysphoric mood. The patient is not nervous/anxious.        Objective:   Vitals:   05/22/16 1519  BP: 140/90  Pulse: 73   Filed Weights   05/22/16 1519  Weight: 183  lb (83 kg)   Body mass index is 25.17 kg/m.   Physical Exam Constitutional: He appears well-developed and well-nourished. No distress.  HENT:  Head: Normocephalic and atraumatic.  Right Ear: External ear normal.  Left Ear: External ear normal.  Mouth/Throat: Oropharynx is clear and moist.  Normal ear canals and TM b/l  Eyes: Conjunctivae and EOM are normal.  Neck: Neck supple. No tracheal deviation present. No thyromegaly present.  No carotid bruit  Cardiovascular: Normal rate, regular rhythm, normal heart sounds and intact distal pulses.   No murmur heard. Pulmonary/Chest: Effort normal and breath sounds normal. No respiratory distress. He has no wheezes. He has no rales.  Abdominal: Soft. Bowel sounds are normal. He exhibits no distension. There is no tenderness.    Genitourinary:  deferred  Musculoskeletal: He exhibits no edema.  Lymphadenopathy:   He has no cervical adenopathy.  Skin: Skin is warm and dry. He is not diaphoretic.  Psychiatric: He has a normal mood and affect. His behavior is normal.         Assessment & Plan:   Physical exam: Screening blood work  ordered Immunizations  Flu vaccine today, discussed shingles vaccine and pneumovax Colonoscopy  Up to date  Eye exams Up to date  EKG - last done in 2015 Exercise - doing some walking, encouraged increasing walking - make it regular Weight - normal BMI Skin - no concerns; uses sunscreen Substance abuse - does not want to quit smoking - advised that he stop  See Problem List for Assessment and Plan of chronic medical problems.  F/u in one year

## 2016-05-22 NOTE — Assessment & Plan Note (Signed)
Borderline high - advised less sodium in his diet, increasing his exercise and stopping smoking Will hold off on additional medication at this time - he prefers not to add any additional medication and I agree cmp and basic labs today

## 2016-05-24 ENCOUNTER — Other Ambulatory Visit (INDEPENDENT_AMBULATORY_CARE_PROVIDER_SITE_OTHER): Payer: Medicare Other

## 2016-05-24 DIAGNOSIS — Z0000 Encounter for general adult medical examination without abnormal findings: Secondary | ICD-10-CM

## 2016-05-24 DIAGNOSIS — Z125 Encounter for screening for malignant neoplasm of prostate: Secondary | ICD-10-CM | POA: Diagnosis not present

## 2016-05-24 DIAGNOSIS — R7303 Prediabetes: Secondary | ICD-10-CM | POA: Diagnosis not present

## 2016-05-24 LAB — PSA, MEDICARE: PSA: 1.72 ng/mL (ref 0.10–4.00)

## 2016-05-24 LAB — COMPREHENSIVE METABOLIC PANEL
ALBUMIN: 4.3 g/dL (ref 3.5–5.2)
ALK PHOS: 56 U/L (ref 39–117)
ALT: 16 U/L (ref 0–53)
AST: 16 U/L (ref 0–37)
BILIRUBIN TOTAL: 0.4 mg/dL (ref 0.2–1.2)
BUN: 19 mg/dL (ref 6–23)
CALCIUM: 9.9 mg/dL (ref 8.4–10.5)
CO2: 29 mEq/L (ref 19–32)
CREATININE: 1.13 mg/dL (ref 0.40–1.50)
Chloride: 104 mEq/L (ref 96–112)
GFR: 68.66 mL/min (ref >60.00)
Glucose, Bld: 99 mg/dL (ref 70–99)
Potassium: 4.9 mEq/L (ref 3.5–5.1)
Sodium: 141 mEq/L (ref 135–145)
TOTAL PROTEIN: 6.7 g/dL (ref 6.0–8.3)

## 2016-05-24 LAB — LIPID PANEL
CHOLESTEROL: 188 mg/dL (ref 0–200)
HDL: 64.1 mg/dL (ref >39.00)
LDL Cholesterol: 96 mg/dL (ref 0–99)
NonHDL: 124
TRIGLYCERIDES: 141 mg/dL (ref 0.0–149.0)
Total CHOL/HDL Ratio: 3
VLDL: 28.2 mg/dL (ref 0.0–40.0)

## 2016-05-24 LAB — CBC WITH DIFFERENTIAL/PLATELET
BASOS ABS: 0 10*3/uL (ref 0.0–0.1)
Basophils Relative: 0.4 % (ref 0.0–3.0)
Eosinophils Absolute: 0.2 10*3/uL (ref 0.0–0.7)
Eosinophils Relative: 2.7 % (ref 0.0–5.0)
HEMATOCRIT: 48.3 % (ref 39.0–52.0)
HEMOGLOBIN: 16.9 g/dL (ref 13.0–17.0)
LYMPHS PCT: 16 % (ref 12.0–46.0)
Lymphs Abs: 1.4 10*3/uL (ref 0.7–4.0)
MCHC: 35 g/dL (ref 30.0–36.0)
MCV: 89.9 fl (ref 78.0–100.0)
MONOS PCT: 5.7 % (ref 3.0–12.0)
Monocytes Absolute: 0.5 10*3/uL (ref 0.1–1.0)
NEUTROS ABS: 6.6 10*3/uL (ref 1.4–7.7)
Neutrophils Relative %: 75.2 % (ref 43.0–77.0)
PLATELETS: 271 10*3/uL (ref 150.0–400.0)
RBC: 5.37 Mil/uL (ref 4.22–5.81)
RDW: 13.3 % (ref 11.5–15.5)
WBC: 8.8 10*3/uL (ref 4.0–10.5)

## 2016-05-24 LAB — HEMOGLOBIN A1C: HEMOGLOBIN A1C: 5.7 % (ref 4.6–6.5)

## 2016-05-24 LAB — TSH: TSH: 1.17 u[IU]/mL (ref 0.35–4.50)

## 2016-09-04 ENCOUNTER — Telehealth: Payer: Self-pay | Admitting: Internal Medicine

## 2016-09-04 DIAGNOSIS — I10 Essential (primary) hypertension: Secondary | ICD-10-CM

## 2016-09-04 MED ORDER — LISINOPRIL-HYDROCHLOROTHIAZIDE 20-12.5 MG PO TABS: 1.0000 | ORAL_TABLET | Freq: Every day | ORAL | 2 refills | Status: DC

## 2016-09-04 NOTE — Telephone Encounter (Signed)
Refills sent to [facility]...[name] [name]

## 2016-09-04 NOTE — Telephone Encounter (Signed)
Patient needs Rx refilled  lisinopril-hydrochlorothiazide (PRINZIDE,ZESTORETIC) 20-12.5 MG tablet  Sent to: [id number], [place] [phone] (Phone) [phone] (Fax)

## 2017-05-23 ENCOUNTER — Other Ambulatory Visit (INDEPENDENT_AMBULATORY_CARE_PROVIDER_SITE_OTHER): Payer: Medicare Other

## 2017-05-23 ENCOUNTER — Encounter: Payer: Self-pay | Admitting: Internal Medicine

## 2017-05-23 ENCOUNTER — Ambulatory Visit (INDEPENDENT_AMBULATORY_CARE_PROVIDER_SITE_OTHER): Payer: Medicare Other | Admitting: Internal Medicine

## 2017-05-23 VITALS — BP 168/84 | HR 69 | Temp 98.8°F | Resp 16 | Ht 71.5 in | Wt 187.0 lb

## 2017-05-23 DIAGNOSIS — Z0000 Encounter for general adult medical examination without abnormal findings: Secondary | ICD-10-CM

## 2017-05-23 DIAGNOSIS — Z0001 Encounter for general adult medical examination with abnormal findings: Secondary | ICD-10-CM | POA: Diagnosis not present

## 2017-05-23 DIAGNOSIS — R7303 Prediabetes: Secondary | ICD-10-CM

## 2017-05-23 DIAGNOSIS — Z125 Encounter for screening for malignant neoplasm of prostate: Secondary | ICD-10-CM | POA: Diagnosis not present

## 2017-05-23 DIAGNOSIS — E7849 Other hyperlipidemia: Secondary | ICD-10-CM

## 2017-05-23 DIAGNOSIS — I10 Essential (primary) hypertension: Secondary | ICD-10-CM

## 2017-05-23 DIAGNOSIS — Z720 Tobacco use: Secondary | ICD-10-CM | POA: Diagnosis not present

## 2017-05-23 DIAGNOSIS — Z23 Encounter for immunization: Secondary | ICD-10-CM

## 2017-05-23 LAB — LIPID PANEL
CHOLESTEROL: 194 mg/dL (ref 0–200)
HDL: 63.3 mg/dL (ref >39.00)
LDL Cholesterol: 110 mg/dL — ABNORMAL HIGH (ref 0–99)
NONHDL: 130.25
Total CHOL/HDL Ratio: 3
Triglycerides: 101 mg/dL (ref 0.0–149.0)
VLDL: 20.2 mg/dL (ref 0.0–40.0)

## 2017-05-23 LAB — COMPREHENSIVE METABOLIC PANEL
ALBUMIN: 4.4 g/dL (ref 3.5–5.2)
ALK PHOS: 52 U/L (ref 39–117)
ALT: 17 U/L (ref 0–53)
AST: 18 U/L (ref 0–37)
BUN: 16 mg/dL (ref 6–23)
CALCIUM: 10 mg/dL (ref 8.4–10.5)
CHLORIDE: 102 meq/L (ref 96–112)
CO2: 30 mEq/L (ref 19–32)
CREATININE: 0.91 mg/dL (ref 0.40–1.50)
GFR: 87.89 mL/min (ref >60.00)
Glucose, Bld: 100 mg/dL — ABNORMAL HIGH (ref 70–99)
POTASSIUM: 4.4 meq/L (ref 3.5–5.1)
Sodium: 139 mEq/L (ref 135–145)
TOTAL PROTEIN: 6.7 g/dL (ref 6.0–8.3)
Total Bilirubin: 0.4 mg/dL (ref 0.2–1.2)

## 2017-05-23 LAB — PSA, MEDICARE: PSA: 1.51 ng/ml (ref 0.10–4.00)

## 2017-05-23 LAB — HEMOGLOBIN A1C: HEMOGLOBIN A1C: 5.8 % (ref 4.6–6.5)

## 2017-05-23 LAB — TSH: TSH: 1.13 u[IU]/mL (ref 0.35–4.50)

## 2017-05-23 MED ORDER — LISINOPRIL-HYDROCHLOROTHIAZIDE 20-25 MG PO TABS: 1.0000 | ORAL_TABLET | Freq: Every day | ORAL | 3 refills | Status: DC

## 2017-05-23 NOTE — Progress Notes (Signed)
Subjective:    Patient ID: [name] [name], male    DOB: 1950-12-01, 66 y.o.   MRN: [id number]  HPI Here for medicare wellness exam and an annual physical.   I have personally reviewed and have noted 1.The patient's medical and social history 2.Their use of alcohol, tobacco or illicit drugs 3.Their current medications and supplements 4.The patient's functional ability including ADL's, fall risks, home                 safety risk and hearing or visual impairment. 5.Diet and physical activities 6.Evidence for depression or mood disorders 7.Care team reviewed  -  Eye doctor    Are there smokers in your home (other than you)? Yes, wife  Risk Factors Exercise: yard work  -- none in winter Dietary issues discussed: balanced, low sodium, low sugar  Vitamin and supplement use:  none  Opiod use:  none Side effects from medication:  n/a Does medications benefits outweigh risks/side effects: n/a  Cardiac risk factors: advanced age, hypertension  Depression Screen  Have you felt down, depressed or hopeless? No  Have you felt little interest or pleasure in doing things?  No  Activities of Daily Living In your present state of health, do you have any difficulty performing the following activities?:  Driving? No Managing money?  No Feeding yourself? No Getting from bed to chair? No Climbing a flight of stairs? No Preparing food and eating?: No Bathing or showering? No Getting dressed: No Getting to/using the toilet? No Moving around from place to place: No In the past year have you fallen or had a near fall?: No   Do you have more than one partner?  No, not sexually active  Hearing Difficulties: No Do you often ask people to speak up or repeat themselves? No Do you experience ringing or noises in your ears? No Do you have difficulty understanding soft or whispered voices? No Vision:              Any change in  vision:  no             Up to date with eye exam:  yes  Memory:  Do you feel that you have a problem with memory? No, except names  Do you often misplace items? No  Do you feel safe at home?  Yes  Cognitive Testing  Alert, Orientated? Yes  Normal Appearance? Yes  Recall of three objects?  Yes  Can perform simple calculations? Yes  Displays appropriate judgment? Yes  Can read the correct time from a watch face? Yes   Advanced Directives have been discussed with the patient? Yes     Medications and allergies reviewed with patient and updated if appropriate.  Patient Active Problem List   Diagnosis Date Noted  . History of colonic polyps 06/02/2014  . Near syncope 07/10/2013  . Pre-diabetes 02/24/2013  . Hyperlipidemia 08/05/2009  . SMOKER 05/11/2009  . BPH without obstruction/lower urinary tract symptoms 05/11/2009  . Essential hypertension 04/08/2007    Current Outpatient Medications on File Prior to Visit  Medication Sig Dispense Refill  . aspirin 81 MG tablet Take 81 mg by mouth daily.       No current facility-administered medications on file prior to visit.     Past Medical History:  Diagnosis Date  . Hypertension     Past Surgical History:  Procedure Laterality Date  . COLONOSCOPY    . [place] EXTRACTION      Social History  Socioeconomic History  . Marital status: Married    Spouse name: None  . Number of children: None  . Years of education: None  . Highest education level: None  Social Needs  . Financial resource strain: None  . Food insecurity - worry: None  . Food insecurity - inability: None  . Transportation needs - medical: None  . Transportation needs - non-medical: None  Occupational History  . None  Tobacco Use  . Smoking status: Current Every Day Smoker    Packs/day: 0.25    Types: Cigarettes  . Smokeless tobacco: Never Used  . Tobacco comment: 06/02/14 2-3 pp week  Substance and Sexual Activity  . Alcohol use: Yes     Alcohol/week: 1.2 oz    Types: 2 Glasses of wine per week    Comment:  occasionally, not daily  . Drug use: No  . Sexual activity: None  Other Topics Concern  . None  Social History Narrative   Walks for exercise    Family History  Problem Relation Age of Onset  . Cancer Mother        bone cancer  . Heart attack Father 60  . Cancer Sister        bone cancer  . Heart attack Paternal Uncle        X2, both < 55  . COPD Neg Hx   . Diabetes Neg Hx   . Stroke Neg Hx   . Colon cancer Neg Hx     Review of Systems  Constitutional: Negative for chills and fever.  HENT: Negative for hearing loss and tinnitus.   Eyes: Negative for visual disturbance.  Respiratory: Negative for cough, shortness of breath and wheezing.   Cardiovascular: Negative for chest pain, palpitations and leg swelling.  Gastrointestinal: Negative for abdominal pain, blood in stool, constipation, diarrhea and nausea.       No gerd  Genitourinary: Negative for dysuria and hematuria.  Musculoskeletal: Positive for back pain (rare). Negative for arthralgias.  Skin: Negative for color change and rash.  Neurological: Negative for light-headedness and headaches.  Psychiatric/Behavioral: Negative for dysphoric mood. The patient is not nervous/anxious.        Objective:   Vitals:   05/23/17 1522  BP: (!) 168/84  Pulse: 69  Resp: 16  Temp: 98.8 F (37.1 C)  SpO2: 96%   Wt Readings from Last 3 Encounters:  05/23/17 187 lb (84.8 kg)  05/22/16 183 lb (83 kg)  06/04/15 187 lb (84.8 kg)   Body mass index is 25.72 kg/m.   Physical Exam    Constitutional: He appears well-developed and well-nourished. No distress.  HENT:  Head: Normocephalic and atraumatic.  Right Ear: External ear normal.  Left Ear: External ear normal.  Mouth/Throat: Oropharynx is clear and moist.  Normal ear canals and TM b/l  Eyes: Conjunctivae and EOM are normal.  Neck: Neck supple. No tracheal deviation present. No thyromegaly  present.  No carotid bruit  Cardiovascular: Normal rate, regular rhythm, normal heart sounds and intact distal pulses.   No murmur heard. Pulmonary/Chest: Effort normal and breath sounds normal. No respiratory distress. He has no wheezes. He has no rales.  Abdominal: Soft. He exhibits no distension. There is no tenderness.  Genitourinary: deferred  Musculoskeletal: He exhibits no edema.  Lymphadenopathy:   He has no cervical adenopathy.  Skin: Skin is warm and dry. He is not diaphoretic.  Psychiatric: He has a normal mood and affect. His behavior is normal.  Assessment & Plan:   Physical exam: Screening blood work ordered Immunizations   Pneumovax and flu due, discussed shingrix Colonoscopy  Up to date  Eye exams up-to-date EKG   Last done 2015 Exercise none-stressed the importance of regular exercise Weight   BMI good for age Skin no skin concerns Substance abuse - stressed smoking cessation,   See Problem List for Assessment and Plan of chronic medical problems.  Follow-up annually

## 2017-05-23 NOTE — Assessment & Plan Note (Signed)
Check a1c Low sugar / carb diet Stressed regular exercise, keeping weight down  

## 2017-05-23 NOTE — Assessment & Plan Note (Signed)
-  Advised smoking cessation

## 2017-05-23 NOTE — Assessment & Plan Note (Signed)
Not controlled Increase lisinopril-hctz to lisinopril-hctz 20-25 mg daily Advised him to monitor his BP Start regular exercise Low sodium diet cmp

## 2017-05-23 NOTE — Patient Instructions (Signed)
[name] [name] , Thank you for taking time to come for your Medicare Wellness Visit. I appreciate your ongoing commitment to your health goals. Please review the following plan we discussed and let me know if I can assist you in the future.   These are the goals we discussed: Goals    Stop smoking, start exercising      This is a list of the screening recommended for you and due dates:  Health Maintenance  Topic Date Due  . Pneumonia vaccines (2 of 2 - PPSV23) Given today  . Tetanus Vaccine  05/12/2019  . Colon Cancer Screening  07/30/2019  . Flu Shot  Completed  .  Hepatitis C: One time screening is recommended by Center for Disease Control  (CDC) for  adults born from [birth year] through 1967.   Completed    Test(s) ordered today. Your results will be released to [facility] (or called to you) after review, usually within 72hours after test completion. If any changes need to be made, you will be notified at that same time.  All other Health Maintenance issues reviewed.   All recommended immunizations and age-appropriate screenings are up-to-date or discussed.  Flu and pneumovax immunizations administered today.   Medications reviewed and updated.  Changes include increasing your BP to lisinopril-hctz 20-25 mg daily.  Your goal BP is < 140/90.  Your prescription(s) have been submitted to your pharmacy. Please take as directed and contact our office if you believe you are having problem(s) with the medication(s).   Please followup in one year    Health Maintenance, Male A healthy lifestyle and preventive care is important for your health and wellness. Ask your health care provider about what schedule of regular examinations is right for you. What should I know about weight and diet? Eat a Healthy Diet  Eat plenty of vegetables, fruits, whole grains, low-fat dairy products, and lean protein.  Do not eat a lot of foods high in solid fats, added sugars, or salt.  Maintain a Healthy  Weight Regular exercise can help you achieve or maintain a healthy weight. You should:  Do at least 150 minutes of exercise each week. The exercise should increase your heart rate and make you sweat (moderate-intensity exercise).  Do strength-training exercises at least twice a week.  Watch Your Levels of Cholesterol and Blood Lipids  Have your blood tested for lipids and cholesterol every 5 years starting at 68 years of age. If you are at high risk for heart disease, you should start having your blood tested when you are 68 years old. You may need to have your cholesterol levels checked more often if: ? Your lipid or cholesterol levels are high. ? You are older than 68 years of age. ? You are at high risk for heart disease.  What should I know about cancer screening? Many types of cancers can be detected early and may often be prevented. Lung Cancer  You should be screened every year for lung cancer if: ? You are a current smoker who has smoked for at least 30 years. ? You are a former smoker who has quit within the past 15 years.  Talk to your health care provider about your screening options, when you should start screening, and how often you should be screened.  Colorectal Cancer  Routine colorectal cancer screening usually begins at 68 years of age and should be repeated every 5-10 years until you are 68 years old. You may need to be screened more  often if early forms of precancerous polyps or small growths are found. Your health care provider may recommend screening at an earlier age if you have risk factors for colon cancer.  Your health care provider may recommend using home test kits to check for hidden blood in the stool.  A small camera at the end of a tube can be used to examine your colon (sigmoidoscopy or colonoscopy). This checks for the earliest forms of colorectal cancer.  Prostate and Testicular Cancer  Depending on your age and overall health, your health care  provider may do certain tests to screen for prostate and testicular cancer.  Talk to your health care provider about any symptoms or concerns you have about testicular or prostate cancer.  Skin Cancer  Check your skin from head to toe regularly.  Tell your health care provider about any new moles or changes in moles, especially if: ? There is a change in a mole's size, shape, or color. ? You have a mole that is larger than a pencil eraser.  Always use sunscreen. Apply sunscreen liberally and repeat throughout the day.  Protect yourself by wearing long sleeves, pants, a wide-brimmed hat, and sunglasses when outside.  What should I know about heart disease, diabetes, and high blood pressure?  If you are 22-61 years of age, have your blood pressure checked every 3-5 years. If you are 5 years of age or older, have your blood pressure checked every year. You should have your blood pressure measured twice-once when you are at a hospital or clinic, and once when you are not at a hospital or clinic. Record the average of the two measurements. To check your blood pressure when you are not at a hospital or clinic, you can use: ? An automated blood pressure machine at a pharmacy. ? A home blood pressure monitor.  Talk to your health care provider about your target blood pressure.  If you are between 11-21 years old, ask your health care provider if you should take aspirin to prevent heart disease.  Have regular diabetes screenings by checking your fasting blood sugar level. ? If you are at a normal weight and have a low risk for diabetes, have this test once every three years after the age of 21. ? If you are overweight and have a high risk for diabetes, consider being tested at a younger age or more often.  A one-time screening for abdominal aortic aneurysm (AAA) by ultrasound is recommended for men aged 75-75 years who are current or former smokers. What should I know about preventing  infection? Hepatitis B If you have a higher risk for hepatitis B, you should be screened for this virus. Talk with your health care provider to find out if you are at risk for hepatitis B infection. Hepatitis C Blood testing is recommended for:  Everyone born from [birth year] through 1967.  Anyone with known risk factors for hepatitis C.  Sexually Transmitted Diseases (STDs)  You should be screened each year for STDs including gonorrhea and chlamydia if: ? You are sexually active and are younger than 68 years of age. ? You are older than 68 years of age and your health care provider tells you that you are at risk for this type of infection. ? Your sexual activity has changed since you were last screened and you are at an increased risk for chlamydia or gonorrhea. Ask your health care provider if you are at risk.  Talk with your health care provider  about whether you are at high risk of being infected with HIV. Your health care provider may recommend a prescription medicine to help prevent HIV infection.  What else can I do?  Schedule regular health, dental, and eye exams.  Stay current with your vaccines (immunizations).  Do not use any tobacco products, such as cigarettes, chewing tobacco, and e-cigarettes. If you need help quitting, ask your health care provider.  Limit alcohol intake to no more than 2 drinks per day. One drink equals 12 ounces of beer, 5 ounces of wine, or 1 ounces of hard liquor.  Do not use street drugs.  Do not share needles.  Ask your health care provider for help if you need support or information about quitting drugs.  Tell your health care provider if you often feel depressed.  Tell your health care provider if you have ever been abused or do not feel safe at home. This information is not intended to replace advice given to you by your health care provider. Make sure you discuss any questions you have with your health care provider. Document Released:  12/02/2007 Document Revised: 02/02/2016 Document Reviewed: 03/09/2015 Elsevier Interactive Patient Education  [employer].

## 2017-05-23 NOTE — Assessment & Plan Note (Signed)
Not on medication  Check lipids, cmp Start regular exercise

## 2018-05-05 NOTE — Patient Instructions (Addendum)
Tests ordered today. Your results will be released to [facility] (or called to you) after review, usually within 72hours after test completion. If any changes need to be made, you will be notified at that same time.  All other Health Maintenance issues reviewed.   All recommended immunizations and age-appropriate screenings are up-to-date or discussed.  Flu immunization administered today.    Think about the shingles vaccine.  Medications reviewed and updated.  Changes include :   none  Your prescription(s) have been submitted to your pharmacy. Please take as directed and contact our office if you believe you are having problem(s) with the medication(s).   Please followup in one year    Health Maintenance, Male A healthy lifestyle and preventive care is important for your health and wellness. Ask your health care provider about what schedule of regular examinations is right for you. What should I know about weight and diet? Eat a Healthy Diet  Eat plenty of vegetables, fruits, whole grains, low-fat dairy products, and lean protein.  Do not eat a lot of foods high in solid fats, added sugars, or salt.  Maintain a Healthy Weight Regular exercise can help you achieve or maintain a healthy weight. You should:  Do at least 150 minutes of exercise each week. The exercise should increase your heart rate and make you sweat (moderate-intensity exercise).  Do strength-training exercises at least twice a week.  Watch Your Levels of Cholesterol and Blood Lipids  Have your blood tested for lipids and cholesterol every 5 years starting at 69 years of age. If you are at high risk for heart disease, you should start having your blood tested when you are 69 years old. You may need to have your cholesterol levels checked more often if: ? Your lipid or cholesterol levels are high. ? You are older than 69 years of age. ? You are at high risk for heart disease.  What should I know about cancer  screening? Many types of cancers can be detected early and may often be prevented. Lung Cancer  You should be screened every year for lung cancer if: ? You are a current smoker who has smoked for at least 30 years. ? You are a former smoker who has quit within the past 15 years.  Talk to your health care provider about your screening options, when you should start screening, and how often you should be screened.  Colorectal Cancer  Routine colorectal cancer screening usually begins at 69 years of age and should be repeated every 5-10 years until you are 69 years old. You may need to be screened more often if early forms of precancerous polyps or small growths are found. Your health care provider may recommend screening at an earlier age if you have risk factors for colon cancer.  Your health care provider may recommend using home test kits to check for hidden blood in the stool.  A small camera at the end of a tube can be used to examine your colon (sigmoidoscopy or colonoscopy). This checks for the earliest forms of colorectal cancer.  Prostate and Testicular Cancer  Depending on your age and overall health, your health care provider may do certain tests to screen for prostate and testicular cancer.  Talk to your health care provider about any symptoms or concerns you have about testicular or prostate cancer.  Skin Cancer  Check your skin from head to toe regularly.  Tell your health care provider about any new moles or changes in  moles, especially if: ? There is a change in a mole's size, shape, or color. ? You have a mole that is larger than a pencil eraser.  Always use sunscreen. Apply sunscreen liberally and repeat throughout the day.  Protect yourself by wearing long sleeves, pants, a wide-brimmed hat, and sunglasses when outside.  What should I know about heart disease, diabetes, and high blood pressure?  If you are 60-25 years of age, have your blood pressure checked  every 3-5 years. If you are 16 years of age or older, have your blood pressure checked every year. You should have your blood pressure measured twice-once when you are at a hospital or clinic, and once when you are not at a hospital or clinic. Record the average of the two measurements. To check your blood pressure when you are not at a hospital or clinic, you can use: ? An automated blood pressure machine at a pharmacy. ? A home blood pressure monitor.  Talk to your health care provider about your target blood pressure.  If you are between 75-27 years old, ask your health care provider if you should take aspirin to prevent heart disease.  Have regular diabetes screenings by checking your fasting blood sugar level. ? If you are at a normal weight and have a low risk for diabetes, have this test once every three years after the age of 76. ? If you are overweight and have a high risk for diabetes, consider being tested at a younger age or more often.  A one-time screening for abdominal aortic aneurysm (AAA) by ultrasound is recommended for men aged 32-75 years who are current or former smokers. What should I know about preventing infection? Hepatitis B If you have a higher risk for hepatitis B, you should be screened for this virus. Talk with your health care provider to find out if you are at risk for hepatitis B infection. Hepatitis C Blood testing is recommended for:  Everyone born from [birth year] through 1967.  Anyone with known risk factors for hepatitis C.  Sexually Transmitted Diseases (STDs)  You should be screened each year for STDs including gonorrhea and chlamydia if: ? You are sexually active and are younger than 69 years of age. ? You are older than 69 years of age and your health care provider tells you that you are at risk for this type of infection. ? Your sexual activity has changed since you were last screened and you are at an increased risk for chlamydia or gonorrhea. Ask your  health care provider if you are at risk.  Talk with your health care provider about whether you are at high risk of being infected with HIV. Your health care provider may recommend a prescription medicine to help prevent HIV infection.  What else can I do?  Schedule regular health, dental, and eye exams.  Stay current with your vaccines (immunizations).  Do not use any tobacco products, such as cigarettes, chewing tobacco, and e-cigarettes. If you need help quitting, ask your health care provider.  Limit alcohol intake to no more than 2 drinks per day. One drink equals 12 ounces of beer, 5 ounces of wine, or 1 ounces of hard liquor.  Do not use street drugs.  Do not share needles.  Ask your health care provider for help if you need support or information about quitting drugs.  Tell your health care provider if you often feel depressed.  Tell your health care provider if you have ever been abused  or do not feel safe at home. This information is not intended to replace advice given to you by your health care provider. Make sure you discuss any questions you have with your health care provider. Document Released: 12/02/2007 Document Revised: 02/02/2016 Document Reviewed: 03/09/2015 Elsevier Interactive Patient Education  [employer].

## 2018-05-05 NOTE — Progress Notes (Signed)
Subjective:    Patient ID: [name] [name], male    DOB: 05-07-1951, 67 y.o.   MRN: [id number]  HPI He is here for a physical exam.     Medications and allergies reviewed with patient and updated if appropriate.  Patient Active Problem List   Diagnosis Date Noted  . History of colonic polyps 06/02/2014  . Near syncope 07/10/2013  . Pre-diabetes 02/24/2013  . Hyperlipidemia 08/05/2009  . Tobacco abuse 05/11/2009  . BPH without obstruction/lower urinary tract symptoms 05/11/2009  . Essential hypertension 04/08/2007    Current Outpatient Medications on File Prior to Visit  Medication Sig Dispense Refill  . aspirin 81 MG tablet Take 81 mg by mouth daily.      Marland Kitchen lisinopril-hydrochlorothiazide (PRINZIDE,ZESTORETIC) 20-25 MG tablet Take 1 tablet by mouth daily. 90 tablet 3   No current facility-administered medications on file prior to visit.     Past Medical History:  Diagnosis Date  . Hypertension     Past Surgical History:  Procedure Laterality Date  . COLONOSCOPY    . WISDOM TOOTH EXTRACTION      Social History   Socioeconomic History  . Marital status: Married    Spouse name: Not on file  . Number of children: Not on file  . Years of education: Not on file  . Highest education level: Not on file  Occupational History  . Not on file  Social Needs  . Financial resource strain: Not on file  . Food insecurity:    Worry: Not on file    Inability: Not on file  . Transportation needs:    Medical: Not on file    Non-medical: Not on file  Tobacco Use  . Smoking status: Current Every Day Smoker    Packs/day: 0.25    Types: Cigarettes  . Smokeless tobacco: Never Used  . Tobacco comment: 06/02/14 2-3 pp week  Substance and Sexual Activity  . Alcohol use: Yes    Alcohol/week: 2.0 standard drinks    Types: 2 Glasses of wine per week    Comment:  occasionally, not daily  . Drug use: No  . Sexual activity: Not on file  Lifestyle  . Physical activity:    Days  per week: Not on file    Minutes per session: Not on file  . Stress: Not on file  Relationships  . Social connections:    Talks on phone: Not on file    Gets together: Not on file    Attends religious service: Not on file    Active member of club or organization: Not on file    Attends meetings of clubs or organizations: Not on file    Relationship status: Not on file  Other Topics Concern  . Not on file  Social History Narrative   Walks for exercise    Family History  Problem Relation Age of Onset  . Cancer Mother        bone cancer  . Heart attack Father 29  . Cancer Sister        bone cancer  . Heart attack Paternal Uncle        X2, both < 55  . COPD Neg Hx   . Diabetes Neg Hx   . Stroke Neg Hx   . Colon cancer Neg Hx     Review of Systems  Constitutional: Negative for chills, fatigue and fever.  Eyes: Negative for visual disturbance.  Respiratory: Negative for cough, shortness of breath and wheezing.  Cardiovascular: Negative for chest pain, palpitations and leg swelling.  Gastrointestinal: Negative for abdominal pain, blood in stool, constipation, diarrhea and nausea.       No gerd  Genitourinary: Negative for difficulty urinating, dysuria and hematuria.  Musculoskeletal: Negative for arthralgias.  Skin: Negative for color change and rash.  Neurological: Negative for light-headedness and headaches.  Psychiatric/Behavioral: Negative for dysphoric mood and sleep disturbance. The patient is not nervous/anxious.        Objective:   Vitals:   05/06/18 1312  BP: 122/70  Pulse: 83  Resp: 16  Temp: 99.1 F (37.3 C)  SpO2: 99%   Filed Weights   05/06/18 1312  Weight: 179 lb (81.2 kg)   Body mass index is 24.62 kg/m.  Wt Readings from Last 3 Encounters:  05/06/18 179 lb (81.2 kg)  05/23/17 187 lb (84.8 kg)  05/22/16 183 lb (83 kg)     Physical Exam Constitutional: He appears well-developed and well-nourished. No distress.  HENT:  Head:  Normocephalic and atraumatic.  Right Ear: External ear normal.  Left Ear: External ear normal.  Mouth/Throat: Oropharynx is clear and moist.  Normal ear canals and TM b/l  Eyes: Conjunctivae and EOM are normal.  Neck: Neck supple. No tracheal deviation present. No thyromegaly present.  No carotid bruit  Cardiovascular: Normal rate, regular rhythm, normal heart sounds and intact distal pulses.   No murmur heard. Pulmonary/Chest: Effort normal and breath sounds normal. No respiratory distress. He has no wheezes. He has no rales.  Abdominal: Soft. He exhibits no distension. There is no tenderness.  Genitourinary: deferred  Musculoskeletal: He exhibits no edema.  Lymphadenopathy:   He has no cervical adenopathy.  Skin: Skin is warm and dry. He is not diaphoretic.  Psychiatric: He has a normal mood and affect. His behavior is normal.         Assessment & Plan:   Physical exam: Screening blood work  ordered Immunizations   Flu today,  shingrix up to date Colonoscopy   Up to date  Eye exams   Up to date  EKG   Done 2015 Exercise   Usually walks for exercise Weight  Normal BMI Skin  No concerns Substance abuse - smoking - 6 cigarettes a day at most - wife still smokes; no other abuse  See Problem List for Assessment and Plan of chronic medical problems.   Fu in one year - CPE

## 2018-05-06 ENCOUNTER — Encounter: Payer: Self-pay | Admitting: Internal Medicine

## 2018-05-06 ENCOUNTER — Other Ambulatory Visit (INDEPENDENT_AMBULATORY_CARE_PROVIDER_SITE_OTHER): Payer: Medicare Other

## 2018-05-06 ENCOUNTER — Ambulatory Visit (INDEPENDENT_AMBULATORY_CARE_PROVIDER_SITE_OTHER): Payer: Medicare Other | Admitting: Internal Medicine

## 2018-05-06 VITALS — BP 122/70 | HR 83 | Temp 99.1°F | Resp 16 | Ht 71.5 in | Wt 179.0 lb

## 2018-05-06 DIAGNOSIS — Z125 Encounter for screening for malignant neoplasm of prostate: Secondary | ICD-10-CM | POA: Diagnosis not present

## 2018-05-06 DIAGNOSIS — Z0000 Encounter for general adult medical examination without abnormal findings: Secondary | ICD-10-CM

## 2018-05-06 DIAGNOSIS — E7849 Other hyperlipidemia: Secondary | ICD-10-CM

## 2018-05-06 DIAGNOSIS — R7303 Prediabetes: Secondary | ICD-10-CM | POA: Diagnosis not present

## 2018-05-06 DIAGNOSIS — Z23 Encounter for immunization: Secondary | ICD-10-CM | POA: Diagnosis not present

## 2018-05-06 DIAGNOSIS — I10 Essential (primary) hypertension: Secondary | ICD-10-CM

## 2018-05-06 DIAGNOSIS — Z720 Tobacco use: Secondary | ICD-10-CM

## 2018-05-06 LAB — COMPREHENSIVE METABOLIC PANEL
ALBUMIN: 4.2 g/dL (ref 3.5–5.2)
ALT: 12 U/L (ref 0–53)
AST: 13 U/L (ref 0–37)
Alkaline Phosphatase: 59 U/L (ref 39–117)
BUN: 16 mg/dL (ref 6–23)
CALCIUM: 9.8 mg/dL (ref 8.4–10.5)
CHLORIDE: 101 meq/L (ref 96–112)
CO2: 27 mEq/L (ref 19–32)
Creatinine, Ser: 0.97 mg/dL (ref 0.40–1.50)
GFR: 81.42 mL/min (ref >60.00)
Glucose, Bld: 97 mg/dL (ref 70–99)
POTASSIUM: 4.7 meq/L (ref 3.5–5.1)
Sodium: 137 mEq/L (ref 135–145)
Total Bilirubin: 0.3 mg/dL (ref 0.2–1.2)
Total Protein: 6.6 g/dL (ref 6.0–8.3)

## 2018-05-06 LAB — CBC WITH DIFFERENTIAL/PLATELET
BASOS PCT: 0.4 % (ref 0.0–3.0)
Basophils Absolute: 0 10*3/uL (ref 0.0–0.1)
EOS ABS: 0.2 10*3/uL (ref 0.0–0.7)
EOS PCT: 1.7 % (ref 0.0–5.0)
HCT: 36.7 % — ABNORMAL LOW (ref 39.0–52.0)
HEMOGLOBIN: 12.1 g/dL — AB (ref 13.0–17.0)
LYMPHS PCT: 22 % (ref 12.0–46.0)
Lymphs Abs: 2.2 10*3/uL (ref 0.7–4.0)
MCHC: 33.1 g/dL (ref 30.0–36.0)
MCV: 87.8 fl (ref 78.0–100.0)
MONOS PCT: 8.5 % (ref 3.0–12.0)
Monocytes Absolute: 0.8 10*3/uL (ref 0.1–1.0)
Neutro Abs: 6.7 10*3/uL (ref 1.4–7.7)
Neutrophils Relative %: 67.4 % (ref 43.0–77.0)
Platelets: 413 10*3/uL — ABNORMAL HIGH (ref 150.0–400.0)
RBC: 4.17 Mil/uL — ABNORMAL LOW (ref 4.22–5.81)
RDW: 12.8 % (ref 11.5–15.5)
WBC: 10 10*3/uL (ref 4.0–10.5)

## 2018-05-06 LAB — LIPID PANEL
CHOLESTEROL: 161 mg/dL (ref 0–200)
HDL: 54.2 mg/dL (ref >39.00)
LDL CALC: 80 mg/dL (ref 0–99)
NonHDL: 107.07
TRIGLYCERIDES: 134 mg/dL (ref 0.0–149.0)
Total CHOL/HDL Ratio: 3
VLDL: 26.8 mg/dL (ref 0.0–40.0)

## 2018-05-06 LAB — HEMOGLOBIN A1C: HEMOGLOBIN A1C: 5.4 % (ref 4.6–6.5)

## 2018-05-06 LAB — TSH: TSH: 1.24 u[IU]/mL (ref 0.35–4.50)

## 2018-05-06 LAB — PSA, MEDICARE: PSA: 1.49 ng/ml (ref 0.10–4.00)

## 2018-05-06 MED ORDER — LISINOPRIL-HYDROCHLOROTHIAZIDE 20-25 MG PO TABS: 1.0000 | ORAL_TABLET | Freq: Every day | ORAL | 3 refills | Status: DC

## 2018-05-06 NOTE — Addendum Note (Signed)
Addended by: Delice Bison E on: 05/06/2018 03:10 PM   Modules accepted: Orders

## 2018-05-06 NOTE — Assessment & Plan Note (Signed)
Check lipid panel  Not currently on medication Regular exercise and healthy diet encouraged

## 2018-05-06 NOTE — Assessment & Plan Note (Signed)
Check a1c Low sugar / carb diet Stressed regular exercise   

## 2018-05-06 NOTE — Assessment & Plan Note (Signed)
Smoking cessation was discussed briefly.  The patient was advised to quit but at this time does not want to quit.

## 2018-05-06 NOTE — Assessment & Plan Note (Signed)
BP well controlled Current regimen effective and well tolerated Continue current medications at current doses cmp  

## 2018-05-08 ENCOUNTER — Other Ambulatory Visit: Payer: Self-pay | Admitting: Internal Medicine

## 2018-05-08 DIAGNOSIS — D649 Anemia, unspecified: Secondary | ICD-10-CM

## 2018-05-09 ENCOUNTER — Telehealth: Payer: Self-pay | Admitting: Internal Medicine

## 2018-05-09 NOTE — Telephone Encounter (Signed)
Copied from [place] [id number]. Topic: Quick Communication - Lab Results (Clinic Use ONLY) >> May 09, 2018  9:56 AM Lorrin Jackson, CMA wrote: Called patient to inform them of 05/06/18 lab results. When patient returns call, triage nurse may disclose results. >> May 09, 2018 10:45 AM Antonieta Iba C wrote: Pt is returning call for results.

## 2018-05-09 NOTE — Telephone Encounter (Signed)
See result note.  

## 2018-05-14 ENCOUNTER — Telehealth: Payer: Self-pay | Admitting: Internal Medicine

## 2018-05-14 NOTE — Telephone Encounter (Signed)
Copied from [place] [id number]. Topic: General - Other >> May 14, 2018  1:19 PM Yvette Rack wrote: Reason for CRM: pt wife calling about lab results

## 2018-05-14 NOTE — Telephone Encounter (Signed)
Patient's wife called, phone ringing busy, no answer.

## 2018-06-13 ENCOUNTER — Other Ambulatory Visit (INDEPENDENT_AMBULATORY_CARE_PROVIDER_SITE_OTHER): Payer: Medicare Other

## 2018-06-13 DIAGNOSIS — D649 Anemia, unspecified: Secondary | ICD-10-CM

## 2018-06-13 LAB — CBC WITH DIFFERENTIAL/PLATELET
Basophils Absolute: 0.1 10*3/uL (ref 0.0–0.1)
Basophils Relative: 0.9 % (ref 0.0–3.0)
EOS ABS: 0.1 10*3/uL (ref 0.0–0.7)
Eosinophils Relative: 1.3 % (ref 0.0–5.0)
HCT: 33.6 % — ABNORMAL LOW (ref 39.0–52.0)
HEMOGLOBIN: 11 g/dL — AB (ref 13.0–17.0)
LYMPHS PCT: 22.7 % (ref 12.0–46.0)
Lymphs Abs: 2.4 10*3/uL (ref 0.7–4.0)
MCHC: 32.6 g/dL (ref 30.0–36.0)
MCV: 82 fl (ref 78.0–100.0)
MONO ABS: 0.9 10*3/uL (ref 0.1–1.0)
Monocytes Relative: 8.8 % (ref 3.0–12.0)
Neutro Abs: 7 10*3/uL (ref 1.4–7.7)
Neutrophils Relative %: 66.3 % (ref 43.0–77.0)
Platelets: 390 10*3/uL (ref 150.0–400.0)
RBC: 4.1 Mil/uL — AB (ref 4.22–5.81)
RDW: 13.9 % (ref 11.5–15.5)
WBC: 10.5 10*3/uL (ref 4.0–10.5)

## 2018-06-14 LAB — IRON,TIBC AND FERRITIN PANEL
%SAT: 4 % — AB (ref 20–48)
FERRITIN: 6 ng/mL — AB (ref 24–380)
Iron: 16 ug/dL — ABNORMAL LOW (ref 50–180)
TIBC: 435 ug/dL — AB (ref 250–425)

## 2018-06-16 ENCOUNTER — Telehealth: Payer: Self-pay | Admitting: Internal Medicine

## 2018-06-16 DIAGNOSIS — D509 Iron deficiency anemia, unspecified: Secondary | ICD-10-CM

## 2018-06-16 NOTE — Telephone Encounter (Signed)
Blood work shows low iron levels and worsening of his anemia.  This is concerning for a possible stomach or intestinal bleed.   If he is still taking a baby aspirin daily he needs to stop it.     I have referred him to GI  - he needs to see them to consider having a colonoscopy / stomach scope to rule out bleeding.  They will call him to schedule.

## 2018-06-17 NOTE — Telephone Encounter (Signed)
Pt aware of results and expressed understanding.  

## 2018-06-17 NOTE — Telephone Encounter (Signed)
Pt returned VM to receive results. No one available. No note okaying nurse triage to give results. Please reach back out to patient.

## 2018-06-17 NOTE — Telephone Encounter (Signed)
LVM for pt to call back in regards.  

## 2018-07-05 ENCOUNTER — Encounter (HOSPITAL_COMMUNITY): Payer: Self-pay

## 2018-07-05 ENCOUNTER — Emergency Department (HOSPITAL_COMMUNITY)
Admission: EM | Admit: 2018-07-05 | Discharge: 2018-07-05 | Disposition: A | Discharge status: Home / Self Care | Payer: Medicare Other | Attending: Emergency Medicine | Admitting: Emergency Medicine

## 2018-07-05 ENCOUNTER — Other Ambulatory Visit: Payer: Self-pay

## 2018-07-05 DIAGNOSIS — R55 Syncope and collapse: Secondary | ICD-10-CM | POA: Insufficient documentation

## 2018-07-05 DIAGNOSIS — Z79899 Other long term (current) drug therapy: Secondary | ICD-10-CM | POA: Diagnosis not present

## 2018-07-05 DIAGNOSIS — D649 Anemia, unspecified: Secondary | ICD-10-CM | POA: Diagnosis not present

## 2018-07-05 DIAGNOSIS — F17210 Nicotine dependence, cigarettes, uncomplicated: Secondary | ICD-10-CM | POA: Insufficient documentation

## 2018-07-05 DIAGNOSIS — R42 Dizziness and giddiness: Secondary | ICD-10-CM | POA: Insufficient documentation

## 2018-07-05 DIAGNOSIS — I10 Essential (primary) hypertension: Secondary | ICD-10-CM | POA: Insufficient documentation

## 2018-07-05 LAB — CBC WITH DIFFERENTIAL/PLATELET
Abs Immature Granulocytes: 0.03 10*3/uL (ref 0.00–0.07)
Basophils Absolute: 0.1 10*3/uL (ref 0.0–0.1)
Basophils Relative: 1 %
EOS ABS: 0.1 10*3/uL (ref 0.0–0.5)
Eosinophils Relative: 1 %
HCT: 34.8 % — ABNORMAL LOW (ref 39.0–52.0)
Hemoglobin: 10.5 g/dL — ABNORMAL LOW (ref 13.0–17.0)
IMMATURE GRANULOCYTES: 0 %
Lymphocytes Relative: 10 %
Lymphs Abs: 1.1 10*3/uL (ref 0.7–4.0)
MCH: 25.1 pg — ABNORMAL LOW (ref 26.0–34.0)
MCHC: 30.2 g/dL (ref 30.0–36.0)
MCV: 83.3 fL (ref 80.0–100.0)
Monocytes Absolute: 0.7 10*3/uL (ref 0.1–1.0)
Monocytes Relative: 6 %
Neutro Abs: 9.4 10*3/uL — ABNORMAL HIGH (ref 1.7–7.7)
Neutrophils Relative %: 82 %
Platelets: 406 10*3/uL — ABNORMAL HIGH (ref 150–400)
RBC: 4.18 MIL/uL — ABNORMAL LOW (ref 4.22–5.81)
RDW: 13.2 % (ref 11.5–15.5)
WBC: 11.5 10*3/uL — ABNORMAL HIGH (ref 4.0–10.5)
nRBC: 0 % (ref 0.0–0.2)

## 2018-07-05 LAB — TYPE AND SCREEN
ABO/RH(D): A POS
Antibody Screen: NEGATIVE

## 2018-07-05 LAB — BASIC METABOLIC PANEL
Anion gap: 11 (ref 5–15)
BUN: 23 mg/dL (ref 8–23)
CO2: 24 mmol/L (ref 22–32)
Calcium: 9.4 mg/dL (ref 8.9–10.3)
Chloride: 103 mmol/L (ref 98–111)
Creatinine, Ser: 1.08 mg/dL (ref 0.61–1.24)
GFR calc Af Amer: >60 mL/min (ref >60)
Glucose, Bld: 120 mg/dL — ABNORMAL HIGH (ref 70–99)
Potassium: 3.6 mmol/L (ref 3.5–5.1)
Sodium: 138 mmol/L (ref 135–145)

## 2018-07-05 LAB — POC OCCULT BLOOD, ED: Fecal Occult Bld: NEGATIVE

## 2018-07-05 LAB — CBG MONITORING, ED: Glucose-Capillary: 118 mg/dL — ABNORMAL HIGH (ref 70–99)

## 2018-07-05 LAB — PROTIME-INR
INR: 1.03
Prothrombin Time: 13.4 seconds (ref 11.4–15.2)

## 2018-07-05 LAB — ABO/RH: ABO/RH(D): A POS

## 2018-07-05 MED ORDER — SODIUM CHLORIDE 0.9 % IV SOLN: INTRAVENOUS | Status: DC

## 2018-07-05 MED ORDER — MECLIZINE HCL 25 MG PO TABS: 25.0000 mg | ORAL_TABLET | Freq: Three times a day (TID) | ORAL | 0 refills | Status: DC | PRN

## 2018-07-05 MED ORDER — SODIUM CHLORIDE 0.9 % IV BOLUS
1000.0000 mL | Freq: Once | INTRAVENOUS | Status: AC
  Administered 2018-07-05: 1000 mL via INTRAVENOUS

## 2018-07-05 NOTE — ED Provider Notes (Signed)
[facility] EMERGENCY DEPARTMENT Provider Note   CSN: [id number] Arrival date & time: 07/05/18  2035     History   Chief Complaint Chief Complaint  Patient presents with  . Fall  . Dizziness    HPI [name] [name] is a 68 y.o. male.  HPI Patient presents to the emergency room for evaluation of a syncopal episode.  Patient was at a get together where he had a couple sips of wine.Marland Kitchen  He was standing talking to friends when he suddenly had a syncopal episode.  Patient did not feel that he had much warning.  According to bystanders the patient regained consciousness very quickly.  There was no seizure activity.  When EMS arrived the patient was alert and oriented.  When they took his blood pressure while standing it was 90/50.  Patient states he feels fine now.  He denies any injuries from the fall.  He is not having any chest pain or headache.  He does not have any numbness or weakness.  He was diagnosed with anemia recently but has not noticed any blood in the stool Past Medical History:  Diagnosis Date  . Anemia   . Diverticulosis   . Hyperlipidemia   . Hypertension   . Tobacco abuse     Patient Active Problem List   Diagnosis Date Noted  . Anemia 05/08/2018  . History of colonic polyps 06/02/2014  . Pre-diabetes 02/24/2013  . Hyperlipidemia 08/05/2009  . Tobacco abuse 05/11/2009  . BPH without obstruction/lower urinary tract symptoms 05/11/2009  . Essential hypertension 04/08/2007    Past Surgical History:  Procedure Laterality Date  . COLONOSCOPY    . WISDOM TOOTH EXTRACTION          Home Medications    Prior to Admission medications   Medication Sig Start Date End Date Taking? Authorizing Provider  lisinopril-hydrochlorothiazide (PRINZIDE,ZESTORETIC) 20-25 MG tablet Take 1 tablet by mouth daily. 05/06/18  Yes Burns, [name], MD  meclizine (ANTIVERT) 25 MG tablet Take 1 tablet (25 mg total) by mouth 3 (three) times daily as needed for dizziness.  07/05/18   Dorie Rank, MD    Family History Family History  Problem Relation Age of Onset  . Cancer Mother        bone cancer  . Heart attack Father 78  . Cancer Sister        bone cancer  . Heart attack Paternal Uncle        X2, both < 55  . COPD Neg Hx   . Diabetes Neg Hx   . Stroke Neg Hx   . Colon cancer Neg Hx     Social History Social History   Tobacco Use  . Smoking status: Current Every Day Smoker    Packs/day: 0.25    Types: Cigarettes  . Smokeless tobacco: Never Used  . Tobacco comment: 06/02/14 2-3 pp week  Substance Use Topics  . Alcohol use: Yes    Alcohol/week: 2.0 standard drinks    Types: 2 Glasses of wine per week    Comment:  occasionally, not daily  . Drug use: No     Allergies   Patient has no known allergies.   Review of Systems Review of Systems  All other systems reviewed and are negative.    Physical Exam Updated Vital Signs BP 122/67   Pulse 72   Temp 98.8 F (37.1 C) (Oral)   Resp (!) 22   SpO2 100%   Physical Exam Vitals signs  and nursing note reviewed.  Constitutional:      General: He is not in acute distress.    Appearance: He is well-developed.  HENT:     Head: Normocephalic and atraumatic.     Right Ear: External ear normal.     Left Ear: External ear normal.  Eyes:     General: No scleral icterus.       Right eye: No discharge.        Left eye: No discharge.     Conjunctiva/sclera: Conjunctivae normal.  Neck:     Musculoskeletal: Neck supple.     Trachea: No tracheal deviation.  Cardiovascular:     Rate and Rhythm: Normal rate and regular rhythm.  Pulmonary:     Effort: Pulmonary effort is normal. No respiratory distress.     Breath sounds: Normal breath sounds. No stridor. No wheezing or rales.  Abdominal:     General: Bowel sounds are normal. There is no distension.     Palpations: Abdomen is soft.     Tenderness: There is no abdominal tenderness. There is no guarding or rebound.  Musculoskeletal:          General: No tenderness.  Skin:    General: Skin is warm and dry.     Findings: No rash.  Neurological:     Mental Status: He is alert.     Cranial Nerves: No cranial nerve deficit (no facial droop, extraocular movements intact, no slurred speech).     Sensory: No sensory deficit.     Motor: No abnormal muscle tone or seizure activity.     Coordination: Coordination normal.      ED Treatments / Results  Labs (all labs ordered are listed, but only abnormal results are displayed) Labs Reviewed  BASIC METABOLIC PANEL - Abnormal; Notable for the following components:      Result Value   Glucose, Bld 120 (*)    All other components within normal limits  CBC WITH DIFFERENTIAL/PLATELET - Abnormal; Notable for the following components:   WBC 11.5 (*)    RBC 4.18 (*)    Hemoglobin 10.5 (*)    HCT 34.8 (*)    MCH 25.1 (*)    Platelets 406 (*)    Neutro Abs 9.4 (*)    All other components within normal limits  CBG MONITORING, ED - Abnormal; Notable for the following components:   Glucose-Capillary 118 (*)    All other components within normal limits  PROTIME-INR  POC OCCULT BLOOD, ED  TYPE AND SCREEN  ABO/RH    EKG EKG Interpretation  Date/Time:  Friday July 05 2018 20:52:17 EST Ventricular Rate:  75 PR Interval:    QRS Duration: 102 QT Interval:  381 QTC Calculation: 426 R Axis:   -14 Text Interpretation:  Sinus rhythm Incomplete RBBB and LAFB No significant change since last tracing Confirmed by Dorie Rank [phone]) on 07/05/2018 9:10:50 PM   Radiology No results found.  Procedures Procedures (including critical care time)  Medications Ordered in ED Medications  sodium chloride 0.9 % bolus 1,000 mL (1,000 mLs Intravenous New Bag/Given 07/05/18 2235)    And  0.9 %  sodium chloride infusion (has no administration in time range)     Initial Impression / Assessment and Plan / ED Course  I have reviewed the triage vital signs and the nursing  notes.  Pertinent labs & imaging results that were available during my care of the patient were reviewed by me and considered in my medical  decision making (see chart for details).  Clinical Course as of Jul 05 2257  Fri Jul 05, 2018  2254 Patient states he is feeling better.   [JK]    Clinical Course User Index [JK] Dorie Rank, MD   Patient presented to the emergency room after a syncopal episode.  Patient was asymptomatic here in the emergency room.  Symptoms may have been vasovagal in nature as he was hypotensive initially.  Patient does have anemia and this does seem to be slightly worse than previous values.  He is guaiac negative here in the ED.  Patient has follow-up already scheduled with Dr. [name], gastroenterology.  At this point I think he is appropriate for continued outpatient management.  Warning signs and precautions discussed.  Final Clinical Impressions(s) / ED Diagnoses   Final diagnoses:  Vertigo  Syncope, unspecified syncope type  Anemia, unspecified type      Dorie Rank, MD 07/05/18 2302

## 2018-07-05 NOTE — ED Triage Notes (Signed)
Pt was at get together and had some wine, pt began to feel dizzy and then fell and passed out.  Was pale after the fall and came back to very quickly per bystanders.  A&Ox4 on arrival to ED. Last BP 110/68 for EMS.  Initially 90/50 while standing.  No fluids given by EMS.  Pt recently diagnosed with anemia.

## 2018-07-08 ENCOUNTER — Telehealth: Payer: Self-pay

## 2018-07-08 NOTE — Telephone Encounter (Signed)
Spoke with pts wife and advised that she call GI and let them know what is going on to see if they can get in any sooner. Wife expressed being very concerned due to pt passing out on Friday. She states she is worried that he is going to pass out at any time and fall down the stairs and really hurt himself. States that his mom had similar sxs and was dx with bone cancer and has concern about that as well. Blood counts were low in the hospital also. I advised I would let Dr. Quay Burow know of her concerns.

## 2018-07-08 NOTE — Telephone Encounter (Signed)
Copied from [place]. Topic: General - Inquiry >> Jul 08, 2018 11:41 AM Virl Axe D wrote: Reason for CRM: Pt's wife Lou Miner called to see if Dr. Quay Burow can help getting her husband into a GI office sooner than February. She would like to speak with Dr. Quay Burow as well. Please advise.

## 2018-07-08 NOTE — Telephone Encounter (Signed)
Called [name] [name] - appt for GI was moved up to 1/22.  Advised he may need to come see me as well after seeing GI

## 2018-07-10 ENCOUNTER — Encounter: Payer: Self-pay | Admitting: Physician Assistant

## 2018-07-10 ENCOUNTER — Ambulatory Visit: Payer: Medicare Other | Admitting: Physician Assistant

## 2018-07-10 VITALS — BP 124/70 | HR 84 | Ht 71.5 in | Wt 177.0 lb

## 2018-07-10 DIAGNOSIS — D509 Iron deficiency anemia, unspecified: Secondary | ICD-10-CM

## 2018-07-10 NOTE — Patient Instructions (Signed)
Start an over the counter iron twice a day. Stop 3 days prior to your colonoscopy.    You have been scheduled for a colonoscopy. Please follow written instructions given to you at your visit today.  Please pick up your prep supplies at the pharmacy within the next 1-3 days. If you use inhalers (even only as needed), please bring them with you on the day of your procedure. Your physician has requested that you go to www.startemmi.com and enter the access code given to you at your visit today. This web site gives a general overview about your procedure. However, you should still follow specific instructions given to you by our office regarding your preparation for the procedure.

## 2018-07-10 NOTE — Progress Notes (Addendum)
Chief Complaint: IDA  HPI:    [name] [name] is a 68 year old male, known to Dr. [name], with a past medical history as listed below, who was referred to me by Binnie Rail, MD for a complaint of iron deficiency anemia.      07/29/2014 colonoscopy done for personal history of colon polyps with a finding of mild sigmoid diverticulosis and otherwise normal colon.  History of adenomatous polyps including tubulovillous adenoma in 2010 (max 12 mm), repeat recommended in 5 years.    07/05/2018 ED visit for syncopal episode.  At that time, patient described being at a get together and had a couple sips of wine and was talking with friends when he suddenly had a syncopal episode.  At that time it was discussed symptoms have been vasovagal in nature as he was hypotensive initially.  CBC with a hemoglobin of 10.5, 11 three weeks ago and 12.1 two months ago, MCV decreased from normal, white count elevated 11.5.  BMP normal.  Fecal occult blood negative.  06/13/2018 iron panel with an iron low at 16, percent saturation low at 4 and ferritin low at 6.    Today, the patient presents clinic accompanied by his wife and together they explain that he had an episode of syncope with no real symptoms prior to it, while at a social gathering as above.  This has scared them both because there was no warning.  Overall the patient has been feeling quite fatigued and unable to complete all the tasks he normally does without feeling tired.  Does tell me he has had a new symptom of some indigestion over the past couple of weeks, uncertain if this is related.    Family history of "bone cancer" in his mother.  Social history positive for being married to an [place] woman.    Denies fever, chills, weight loss, anorexia, nausea, vomiting, change in bowel habits, blood in his stool or abdominal pain.  Past Medical History:  Diagnosis Date  . Anemia   . Diverticulosis   . Hyperlipidemia   . Hypertension   . Tobacco abuse     Past  Surgical History:  Procedure Laterality Date  . COLONOSCOPY    . WISDOM TOOTH EXTRACTION      Current Outpatient Medications  Medication Sig Dispense Refill  . lisinopril-hydrochlorothiazide (PRINZIDE,ZESTORETIC) 20-25 MG tablet Take 1 tablet by mouth daily. 90 tablet 3   No current facility-administered medications for this visit.     Allergies as of 07/10/2018  . (No Known Allergies)    Family History  Problem Relation Age of Onset  . Cancer Mother        bone cancer  . Heart attack Father 18  . Cancer Sister        bone cancer  . Heart attack Paternal Uncle        X2, both < 55  . COPD Neg Hx   . Diabetes Neg Hx   . Stroke Neg Hx   . Colon cancer Neg Hx     Social History   Socioeconomic History  . Marital status: Married    Spouse name: Not on file  . Number of children: Not on file  . Years of education: Not on file  . Highest education level: Not on file  Occupational History  . Not on file  Social Needs  . Financial resource strain: Not on file  . Food insecurity:    Worry: Not on file    Inability: Not  on file  . Transportation needs:    Medical: Not on file    Non-medical: Not on file  Tobacco Use  . Smoking status: Current Every Day Smoker    Packs/day: 0.25    Types: Cigarettes  . Smokeless tobacco: Never Used  . Tobacco comment: 06/02/14 2-3 pp week  Substance and Sexual Activity  . Alcohol use: Yes    Alcohol/week: 2.0 standard drinks    Types: 2 Glasses of wine per week    Comment:  occasionally, not daily  . Drug use: No  . Sexual activity: Not on file  Lifestyle  . Physical activity:    Days per week: Not on file    Minutes per session: Not on file  . Stress: Not on file  Relationships  . Social connections:    Talks on phone: Not on file    Gets together: Not on file    Attends religious service: Not on file    Active member of club or organization: Not on file    Attends meetings of clubs or organizations: Not on file     Relationship status: Not on file  . Intimate partner violence:    Fear of current or ex partner: Not on file    Emotionally abused: Not on file    Physically abused: Not on file    Forced sexual activity: Not on file  Other Topics Concern  . Not on file  Social History Narrative   Walks for exercise    Review of Systems:    Constitutional: No weight loss, fever or chills Skin: No rash  Cardiovascular: No chest pain Respiratory: No SOB Gastrointestinal: See HPI and otherwise negative Genitourinary: No dysuria  Neurological: +syncope Musculoskeletal: No new muscle or joint pain Hematologic: No bleeding  Psychiatric: No history of depression or anxiety   Physical Exam:  Vital signs: BP 124/70   Pulse 84   Ht 5' 11.5" (1.816 m)   Wt 177 lb (80.3 kg)   BMI 24.34 kg/m   Constitutional:   Pleasant Caucasian male appears to be in NAD, Well developed, Well nourished, alert and cooperative Head:  Normocephalic and atraumatic. Eyes:   PEERL, EOMI. No icterus. Conjunctiva pink. Ears:  Normal auditory acuity. Neck:  Supple Throat: Oral cavity and pharynx without inflammation, swelling or lesion.  Respiratory: Respirations even and unlabored. Lungs clear to auscultation bilaterally.   No wheezes, crackles, or rhonchi.  Cardiovascular: Normal S1, S2. No MRG. Regular rate and rhythm. No peripheral edema, cyanosis or pallor.  Gastrointestinal:  Soft, nondistended, nontender. No rebound or guarding. Normal bowel sounds. No appreciable masses or hepatomegaly. Rectal:  Not performed.  Msk:  Symmetrical without gross deformities. Without edema, no deformity or joint abnormality.  Neurologic:  Alert and  oriented x4;  grossly normal neurologically.  Skin:   Dry and intact without significant lesions or rashes. Psychiatric: Demonstrates good judgement and reason without abnormal affect or behaviors.  MOST RECENT LABS AND IMAGING: CBC    Component Value Date/Time   WBC 11.5 (H) 07/05/2018  2050   RBC 4.18 (L) 07/05/2018 2050   HGB 10.5 (L) 07/05/2018 2050   HCT 34.8 (L) 07/05/2018 2050   PLT 406 (H) 07/05/2018 2050   MCV 83.3 07/05/2018 2050   MCH 25.1 (L) 07/05/2018 2050   MCHC 30.2 07/05/2018 2050   RDW 13.2 07/05/2018 2050   LYMPHSABS 1.1 07/05/2018 2050   MONOABS 0.7 07/05/2018 2050   EOSABS 0.1 07/05/2018 2050   BASOSABS 0.1  07/05/2018 2050    CMP     Component Value Date/Time   NA 138 07/05/2018 2050   K 3.6 07/05/2018 2050   CL 103 07/05/2018 2050   CO2 24 07/05/2018 2050   GLUCOSE 120 (H) 07/05/2018 2050   BUN 23 07/05/2018 2050   CREATININE 1.08 07/05/2018 2050   CALCIUM 9.4 07/05/2018 2050   PROT 6.6 05/06/2018 1350   ALBUMIN 4.2 05/06/2018 1350   AST 13 05/06/2018 1350   ALT 12 05/06/2018 1350   ALKPHOS 59 05/06/2018 1350   BILITOT 0.3 05/06/2018 1350   GFRNONAA >60 07/05/2018 2050   GFRAA >60 07/05/2018 2050    Assessment: 1.  IDA: Hemoglobin has been decreasing recently over the past 2 months, 10.5 five days ago, Hemoccult negative in the ED, iron deficient per recent labs (see HPI), last colonoscopy 2016 with recommendations to repeat in 5 years given history of tubular adenomas; consider GI source of blood loss versus other 2.  History of syncope: Thought related to above  Plan: 1.  Scheduled patient for an EGD and colonoscopy in the [place] [facility] with Dr. [name].  Did discuss risks, benefits, limitations and alternatives and the patient agrees to proceed. 2.  Recommend the patient start oral iron supplementation daily 3.  Patient to follow in clinic per recommendations after procedures as above  [name] Newer, PA-C [facility] 07/10/2018, 10:43 AM  Cc: Binnie Rail, MD   Agree with [name] [name]'s evaluation and management.  Gatha Mayer, MD, [facility]

## 2018-07-24 ENCOUNTER — Ambulatory Visit: Payer: Medicare Other | Admitting: Internal Medicine

## 2018-07-25 ENCOUNTER — Ambulatory Visit (AMBULATORY_SURGERY_CENTER): Payer: Medicare Other | Admitting: Internal Medicine

## 2018-07-25 ENCOUNTER — Encounter: Payer: Self-pay | Admitting: Internal Medicine

## 2018-07-25 VITALS — BP 106/59 | HR 73 | Temp 98.0°F | Resp 19 | Ht 71.5 in | Wt 177.0 lb

## 2018-07-25 DIAGNOSIS — K228 Other specified diseases of esophagus: Secondary | ICD-10-CM | POA: Diagnosis not present

## 2018-07-25 DIAGNOSIS — D122 Benign neoplasm of ascending colon: Secondary | ICD-10-CM

## 2018-07-25 DIAGNOSIS — C159 Malignant neoplasm of esophagus, unspecified: Secondary | ICD-10-CM | POA: Diagnosis not present

## 2018-07-25 DIAGNOSIS — D120 Benign neoplasm of cecum: Secondary | ICD-10-CM

## 2018-07-25 DIAGNOSIS — D509 Iron deficiency anemia, unspecified: Secondary | ICD-10-CM

## 2018-07-25 DIAGNOSIS — K2289 Other specified disease of esophagus: Secondary | ICD-10-CM

## 2018-07-25 MED ORDER — SODIUM CHLORIDE 0.9 % IV SOLN: 500.0000 mL | Freq: Once | INTRAVENOUS | Status: DC

## 2018-07-25 NOTE — Patient Instructions (Addendum)
There is a problem in the esophagus and top of the stomach. I am concerned that it is a cancer. It bleeds easily and must be where the anemia came from.  I took biopsies and should know by Monday - possibly tomorrow. Then we will determine the next steps.  Two colon polyps - small and not a problem - removed.  I appreciate the opportunity to care for you. Gatha Mayer, MD, [facility]   You may also resume your iron tablets.  YOU HAD AN ENDOSCOPIC PROCEDURE TODAY AT [facility]:   Refer to the procedure report that was given to you for any specific questions about what was found during the examination.  If the procedure report does not answer your questions, please call your gastroenterologist to clarify.  If you requested that your care partner not be given the details of your procedure findings, then the procedure report has been included in a sealed envelope for you to review at your convenience later.  YOU SHOULD EXPECT: Some feelings of bloating in the abdomen. Passage of more gas than usual.  Walking can help get rid of the air that was put into your GI tract during the procedure and reduce the bloating. If you had a lower endoscopy (such as a colonoscopy or flexible sigmoidoscopy) you may notice spotting of blood in your stool or on the toilet paper. If you underwent a bowel prep for your procedure, you may not have a normal bowel movement for a few days.  Please Note:  You might notice some irritation and congestion in your nose or some drainage.  This is from the oxygen used during your procedure.  There is no need for concern and it should clear up in a day or so.  SYMPTOMS TO REPORT IMMEDIATELY:   Following lower endoscopy (colonoscopy or flexible sigmoidoscopy):  Excessive amounts of blood in the stool  Significant tenderness or worsening of abdominal pains  Swelling of the abdomen that is new, acute  Fever of 100F or higher   Following upper endoscopy  (EGD)  Vomiting of blood or coffee ground material  New chest pain or pain under the shoulder blades  Painful or persistently difficult swallowing  New shortness of breath  Fever of 100F or higher  Black, tarry-looking stools  For urgent or emergent issues, a gastroenterologist can be reached at any hour by calling [phone].   DIET:  We do recommend a small meal at first, but then you may proceed to your regular diet.  Drink plenty of fluids but you should avoid alcoholic beverages for 24 hours.  ACTIVITY:  You should plan to take it easy for the rest of today and you should NOT DRIVE or use heavy machinery until tomorrow (because of the sedation medicines used during the test).    FOLLOW UP: Our staff will call the number listed on your records the next business day following your procedure to check on you and address any questions or concerns that you may have regarding the information given to you following your procedure. If we do not reach you, we will leave a message.  However, if you are feeling well and you are not experiencing any problems, there is no need to return our call.  We will assume that you have returned to your regular daily activities without incident.  If any biopsies were taken you will be contacted by phone or by letter within the next 1-3 weeks.  Please call  us at [phone] if you have not heard about the biopsies in 3 weeks.    SIGNATURES/CONFIDENTIALITY: You and/or your care partner have signed paperwork which will be entered into your electronic medical record.  These signatures attest to the fact that that the information above on your After Visit Summary has been reviewed and is understood.  Full responsibility of the confidentiality of this discharge information lies with you and/or your care-partner.

## 2018-07-25 NOTE — Progress Notes (Signed)
Called to room to assist during endoscopic procedure.  Patient ID and intended procedure confirmed with present staff. Received instructions for my participation in the procedure from the performing physician.  

## 2018-07-25 NOTE — Op Note (Addendum)
[name] [name] Patient Name: [name] [name] Procedure Date: 07/25/2018 2:32 PM MRN: [id number] Endoscopist: Gatha Mayer , MD Age: 68 Referring MD:  Date of Birth: [birth year] Gender: Male Account #: [id number] Procedure:                Upper GI endoscopy Indications:              Iron deficiency anemia Medicines:                Propofol per Anesthesia, Monitored Anesthesia Care Procedure:                Pre-Anesthesia Assessment:                           - Prior to the procedure, a History and Physical                            was performed, and patient medications and                            allergies were reviewed. The patient's tolerance of                            previous anesthesia was also reviewed. The risks                            and benefits of the procedure and the sedation                            options and risks were discussed with the patient.                            All questions were answered, and informed consent                            was obtained. Prior Anticoagulants: The patient has                            taken no previous anticoagulant or antiplatelet                            agents. ASA Grade Assessment: II - A patient with                            mild systemic disease. After reviewing the risks                            and benefits, the patient was deemed in                            satisfactory condition to undergo the procedure.                           After obtaining informed consent, the endoscope was  passed under direct vision. Throughout the                            procedure, the patient's blood pressure, pulse, and                            oxygen saturations were monitored continuously. The                            Endoscope was introduced through the mouth, and                            advanced to the second part of duodenum. The upper                            GI endoscopy  was accomplished without difficulty.                            The patient tolerated the procedure well. Scope In: Scope Out: Findings:                 A large, submucosal and ulcerating mass with                            bleeding and stigmata of recent bleeding was found                            in the distal esophagus, at the gastroesophageal                            junction and in the cardia, 38 cm from the                            incisors. The mass was non-obstructing. Biopsies                            were taken with a cold forceps for histology.                            Verification of patient identification for the                            specimen was done. Estimated blood loss was minimal.                           Patchy mucosal changes characterized by nodularity                            were found in the distal esophagus. Biopsies were                            taken with a cold forceps for histology.  Verification of patient identification for the                            specimen was done. Estimated blood loss was minimal.                           The exam was otherwise without abnormality. Complications:            No immediate complications. Estimated Blood Loss:     Estimated blood loss was minimal. Impression:               - Likely malignant esophageal tumor was found at                            the gastroesophageal junction and in the cardia.                            Biopsied. 38-41 cm                           - Nodular mucosa in the esophagus. Biopsied. 35-38                            cm                           - The examination was otherwise normal. Recommendation:           - Patient has a contact number available for                            emergencies. The signs and symptoms of potential                            delayed complications were discussed with the                            patient. Return to  normal activities tomorrow.                            Written discharge instructions were provided to the                            patient.                           - Resume previous diet.                           - Continue present medications.                           - See the other procedure note for documentation of                            additional recommendations. Gatha Mayer, MD 07/25/2018 3:09:10 PM This report has been signed electronically.

## 2018-07-25 NOTE — Op Note (Signed)
[facility] Patient Name: [name] [name] Procedure Date: 07/25/2018 2:32 PM MRN: [id number] Endoscopist: Gatha Mayer , MD Age: 68 Referring MD:  Date of Birth: 20-Dec-1950 Gender: Male Account #: [id number] Procedure:                Colonoscopy Indications:              Iron deficiency anemia Medicines:                Propofol per Anesthesia, Monitored Anesthesia Care Procedure:                Pre-Anesthesia Assessment:                           - Prior to the procedure, a History and Physical                            was performed, and patient medications and                            allergies were reviewed. The patient's tolerance of                            previous anesthesia was also reviewed. The risks                            and benefits of the procedure and the sedation                            options and risks were discussed with the patient.                            All questions were answered, and informed consent                            was obtained. Prior Anticoagulants: The patient has                            taken no previous anticoagulant or antiplatelet                            agents. ASA Grade Assessment: II - A patient with                            mild systemic disease. After reviewing the risks                            and benefits, the patient was deemed in                            satisfactory condition to undergo the procedure.                           After obtaining informed consent, the colonoscope  was passed under direct vision. Throughout the                            procedure, the patient's blood pressure, pulse, and                            oxygen saturations were monitored continuously. The                            Colonoscope was introduced through the anus and                            advanced to the the cecum, identified by                            appendiceal orifice and  ileocecal valve. The                            colonoscopy was performed without difficulty. The                            patient tolerated the procedure well. The quality                            of the bowel preparation was good. The ileocecal                            valve, appendiceal orifice, and rectum were                            photographed. Scope In: 2:51:16 PM Scope Out: 3:00:21 PM Scope Withdrawal Time: 0 hours 5 minutes 12 seconds  Total Procedure Duration: 0 hours 9 minutes 5 seconds  Findings:                 The perianal and digital rectal examinations were                            normal.                           Two sessile polyps were found in the ascending                            colon and cecum. The polyps were diminutive in                            size. These polyps were removed with a cold snare.                            Resection and retrieval were complete. Verification                            of patient identification for the specimen was  done. Estimated blood loss was minimal.                           The exam was otherwise without abnormality on                            direct and retroflexion views. Complications:            No immediate complications. Estimated Blood Loss:     Estimated blood loss was minimal. Impression:               - Two diminutive polyps in the ascending colon and                            in the cecum, removed with a cold snare. Resected                            and retrieved.                           - The examination was otherwise normal on direct                            and retroflexion views. Recommendation:           - Patient has a contact number available for                            emergencies. The signs and symptoms of potential                            delayed complications were discussed with the                            patient. Return to normal activities  tomorrow.                            Written discharge instructions were provided to the                            patient.                           - Resume previous diet.                           - Continue present medications.                           - Await pathology results.                           - No recommendation at this time regarding repeat                            colonoscopy. He has a suspicious mass in the  esophagus/GE junction Gatha Mayer, MD 07/25/2018 3:11:40 PM This report has been signed electronically.

## 2018-07-25 NOTE — Progress Notes (Signed)
Report given to PACU, vss 

## 2018-07-26 ENCOUNTER — Telehealth: Payer: Self-pay

## 2018-07-26 NOTE — Telephone Encounter (Signed)
  Follow up Call-  Call back number 07/25/2018  Post procedure Call Back phone  # [phone]  Permission to leave phone message Yes  Some recent data might be hidden     Patient questions:  Do you have a fever, pain , or abdominal swelling? No Pain Score  0  Have you tolerated food without any problems? Yes  Have you been able to return to your normal activities? Yes  Do you have any questions about your discharge instructions: Diet   No Medications  No Follow up visit  No  Do you have questions or concerns about your Care? No  Actions: * If pain score is 4 or above: No action needed, pain <4

## 2018-07-29 ENCOUNTER — Encounter: Payer: Self-pay | Admitting: Internal Medicine

## 2018-07-29 ENCOUNTER — Telehealth: Payer: Self-pay | Admitting: Internal Medicine

## 2018-07-29 ENCOUNTER — Other Ambulatory Visit: Payer: Self-pay

## 2018-07-29 DIAGNOSIS — C160 Malignant neoplasm of cardia: Secondary | ICD-10-CM

## 2018-07-29 HISTORY — DX: Malignant neoplasm of cardia: C16.0

## 2018-07-29 NOTE — Telephone Encounter (Signed)
See pathology result note for call documentation

## 2018-07-29 NOTE — Progress Notes (Signed)
Have called both #'s but no answer - 2/7 and 2/10 Trying to give bx results - dx adenocarcinoma of GE junction and esophagus  Does not need a letter or recall from [facility]

## 2018-07-29 NOTE — Progress Notes (Signed)
I spoke to patient and wife and informed of adenocarcinoma GE junction/esophagus diagnosis  He needs: CBC, CMET, CEA CT chest, abdomen and pelvis with IV contrast dx adenocarcinoma of gastroesophageal junction Appointment with Dr. Burr Medico or Benay Spice

## 2018-07-29 NOTE — Progress Notes (Signed)
Ct chest

## 2018-07-30 ENCOUNTER — Other Ambulatory Visit (INDEPENDENT_AMBULATORY_CARE_PROVIDER_SITE_OTHER): Payer: Medicare Other

## 2018-07-30 ENCOUNTER — Other Ambulatory Visit: Payer: Self-pay | Admitting: Radiation Oncology

## 2018-07-30 ENCOUNTER — Telehealth: Payer: Self-pay

## 2018-07-30 DIAGNOSIS — C160 Malignant neoplasm of cardia: Secondary | ICD-10-CM

## 2018-07-30 LAB — COMPREHENSIVE METABOLIC PANEL
ALT: 12 U/L (ref 0–53)
AST: 14 U/L (ref 0–37)
Albumin: 4.2 g/dL (ref 3.5–5.2)
Alkaline Phosphatase: 58 U/L (ref 39–117)
BUN: 15 mg/dL (ref 6–23)
CO2: 27 meq/L (ref 19–32)
Calcium: 9.7 mg/dL (ref 8.4–10.5)
Chloride: 102 mEq/L (ref 96–112)
Creatinine, Ser: 0.99 mg/dL (ref 0.40–1.50)
GFR: 74.77 mL/min (ref >60.00)
Glucose, Bld: 91 mg/dL (ref 70–99)
Potassium: 3.7 mEq/L (ref 3.5–5.1)
Sodium: 138 mEq/L (ref 135–145)
Total Bilirubin: 0.3 mg/dL (ref 0.2–1.2)
Total Protein: 6.6 g/dL (ref 6.0–8.3)

## 2018-07-30 LAB — CBC
HCT: 39.7 % (ref 39.0–52.0)
Hemoglobin: 12.9 g/dL — ABNORMAL LOW (ref 13.0–17.0)
MCHC: 32.4 g/dL (ref 30.0–36.0)
MCV: 80.6 fl (ref 78.0–100.0)
Platelets: 406 10*3/uL — ABNORMAL HIGH (ref 150.0–400.0)
RBC: 4.93 Mil/uL (ref 4.22–5.81)
RDW: 19 % — ABNORMAL HIGH (ref 11.5–15.5)
WBC: 9.7 10*3/uL (ref 4.0–10.5)

## 2018-07-30 NOTE — Progress Notes (Signed)
Called patient to introduce myself and role of GI Navigator. Advised of appointment with Dr. Benay Spice on 08/05/18 @ 2:00 PM. Patient aware to arrive 30 minutes prior to scheduled appointment. Patient has my direct contact number for questions or concerns. Referral placed to Rad/onc per Dr. Benay Spice.

## 2018-07-30 NOTE — Telephone Encounter (Signed)
Left VM stating that he would be receiving a call to schedule a PET scan for staging and planning purposes. He has my call back number.

## 2018-07-31 ENCOUNTER — Telehealth: Payer: Self-pay

## 2018-07-31 LAB — CEA: CEA: 0.5 ng/mL

## 2018-07-31 NOTE — Telephone Encounter (Signed)
Called patient to review upcoming appointments.

## 2018-08-01 ENCOUNTER — Ambulatory Visit (HOSPITAL_BASED_OUTPATIENT_CLINIC_OR_DEPARTMENT_OTHER)
Admission: RE | Admit: 2018-08-01 | Discharge: 2018-08-01 | Disposition: A | Discharge status: Home / Self Care | Payer: Medicare Other | Source: Ambulatory Visit | Attending: Internal Medicine | Admitting: Internal Medicine

## 2018-08-01 ENCOUNTER — Encounter (HOSPITAL_BASED_OUTPATIENT_CLINIC_OR_DEPARTMENT_OTHER): Payer: Self-pay

## 2018-08-01 ENCOUNTER — Telehealth: Payer: Self-pay | Admitting: *Deleted

## 2018-08-01 DIAGNOSIS — C160 Malignant neoplasm of cardia: Secondary | ICD-10-CM | POA: Insufficient documentation

## 2018-08-01 MED ORDER — IOPAMIDOL (ISOVUE-300) INJECTION 61%
100.0000 mL | Freq: Once | INTRAVENOUS | Status: AC | PRN
  Administered 2018-08-01: 100 mL via INTRAVENOUS

## 2018-08-01 NOTE — Telephone Encounter (Signed)
CALLED PATIENT TO INFORM OF PET SCAN FOR 08-09-18 - ARRIVAL TIME - 6:30 AM @ [facility], PT. TO BE NPO- 6 HRS. PRIOR TO TEST, NO ANSWER, WILL CALL PATIENT LATER

## 2018-08-05 ENCOUNTER — Inpatient Hospital Stay: Payer: Medicare Other | Attending: Nurse Practitioner | Admitting: Oncology

## 2018-08-05 ENCOUNTER — Telehealth: Payer: Self-pay

## 2018-08-05 VITALS — BP 110/75 | HR 80 | Temp 98.5°F | Resp 20 | Ht 71.5 in | Wt 172.6 lb

## 2018-08-05 DIAGNOSIS — I10 Essential (primary) hypertension: Secondary | ICD-10-CM

## 2018-08-05 DIAGNOSIS — D508 Other iron deficiency anemias: Secondary | ICD-10-CM | POA: Diagnosis not present

## 2018-08-05 DIAGNOSIS — F17210 Nicotine dependence, cigarettes, uncomplicated: Secondary | ICD-10-CM | POA: Diagnosis not present

## 2018-08-05 DIAGNOSIS — C160 Malignant neoplasm of cardia: Secondary | ICD-10-CM

## 2018-08-05 NOTE — Patient Instructions (Signed)
Please provide a copy of your Medical Advanced Directive/Living Will to have scanned into your record. 

## 2018-08-05 NOTE — Progress Notes (Signed)
START ON PATHWAY REGIMEN - Gastroesophageal     Administer weekly during RT:     Paclitaxel      Carboplatin   **Always confirm dose/schedule in your pharmacy ordering system**  Patient Characteristics: Esophageal & GE Junction, Adenocarcinoma, Preoperative or Nonsurgical Candidate (Clinical Staging), cT2 or Higher or cN+, Surgical Candidate (Up to cT4a) - Preoperative Therapy, GE Junction Histology: Adenocarcinoma Disease Classification: GE Junction Therapeutic Status: Preoperative or Nonsurgical Candidate (Clinical Staging) AJCC Grade: Staged < 8th Ed. AJCC 8 Stage Grouping: Staged < 8th Ed. AJCC T Category: Staged < 8th Ed. AJCC N Category: Staged < 8th Ed. AJCC M Category: Staged < 8th Ed. Intent of Therapy: Curative Intent, Discussed with Patient 

## 2018-08-05 NOTE — Progress Notes (Signed)
  Oncology Nurse Navigator Documentation   Met with [name] [name] and his wife  at initial consult. Information and phone numbers provided for entire treatment team. Provided information on GI Support Group and Symptom Management Clinic. I informed patient of PET scan scheduled for 08/09/17. Patient aware to arrive at 6:30 Am to [facility] for a 7 AM scan and to be NPO 6 hours prior including mint, gum and medications. General questions about Taxol and Carboplatin answered. Patient scheduled for chemo education class on 08/08/18. Referrals to [facility] and dietician placed. Encouraged patient to reach out with questions or concerns. No barriers to tx identified.

## 2018-08-05 NOTE — Progress Notes (Signed)
[facility] Patient Consult   Requesting MD: Seymour Pavlak 68 y.o.  Nov 06, 1950    Reason for Consult: Gastroesophageal cancer   HPI: [name] [name] diagnosed with iron deficiency anemia by Dr. Quay Burow in December 2019.  He was seen in the emergency room after a witnessed syncope event on 07/05/2018.  He was confirmed to be anemic.  The etiology of the syncope event was unclear.  He was placed on iron and referred to Dr. [name]. He was taken to an upper endoscopy and colonoscopy on 07/25/2018.  The colonoscopy revealed sessile polyps in the ascending colon and cecum.  The polyps were removed.  A large ulcerated mass with bleeding and stigmata of bleeding was noted in the distal esophagus, GE junction, and in the gastric cardia.  The mass was noted at 38 cm from the incisors.  The mass was nonobstructing.  Biopsies were obtained.  Biopsies from the GE junction mass returned as adenocarcinoma.  A biopsy from the distal esophagus returned as adenocarcinoma.  The polyps from the colon were tubular adenomas. CTs of the chest, abdomen, and pelvis on 08/01/2018 revealed wall thickening at the distal esophagus extending into the gastroesophageal junction.  No suspicious pulmonary nodule or mass.  A 15 mm this was noted in the medial left liver.  A 7 mm low-density lesion in the dome of the liver is too small to characterize.  There is thickening of the adrenal glands bilaterally the left greater than right.  A 2.8 x 1.3 cm node versus nodal conglomeration is identified in the gastropathic ligament.  There is a 7 x 11 mm perigastric node.  The prostate gland is enlarged.  Bilateral groin hernias containing fat.  He reports feeling well.  Past Medical History:  Diagnosis Date  . Adenocarcinoma of gastroesophageal junction ([place]) 07/29/2018  . Anemia-iron deficiency  December 2019  . Diverticulosis   . Hyperlipidemia   . Hypertension   . Tobacco abuse     Past Surgical  History:  Procedure Laterality Date  . COLONOSCOPY    . WISDOM TOOTH EXTRACTION      Medications: Reviewed  Allergies: No Known Allergies  Family history: His mother had "bone cancer ".  No other family history of cancer  Social History:   He lives with his wife in [place].  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  He smokes 1/2 pack of cigarettes per day.  He reports occasional alcohol use.  No transfusion history.  No risk factor for HIV or hepatitis.  ROS:   Positives include: Intermittent belching  A complete ROS was otherwise negative.  Physical Exam:  Blood pressure 110/75, pulse 80, temperature 98.5 F (36.9 C), temperature source Oral, resp. rate 20, height 5' 11.5" (1.816 m), weight 172 lb 9.6 oz (78.3 kg), SpO2 99 %.  HEENT: Oropharynx without visible mass, neck without mass Lungs: Scattered end inspiratory/expiratory rhonchi, no respiratory distress Cardiac: Regular rate and rhythm Abdomen: No hepatosplenomegaly, no mass, nontender, reducible left inguinal hernia GU: Testes without mass Vascular: No leg edema Lymph nodes: No cervical, supraclavicular, axillary, or inguinal nodes Neurologic: There are and oriented, the motor exam appears intact in the upper and lower extremities bilaterally Skin: No rash Musculoskeletal: No spine tenderness   LAB:  CBC  Lab Results  Component Value Date   WBC 9.7 07/30/2018   HGB 12.9 (L) 07/30/2018   HCT 39.7 07/30/2018   MCV 80.6 07/30/2018   PLT 406.0 (  H) 07/30/2018   NEUTROABS 9.4 (H) 07/05/2018        CMP  Lab Results  Component Value Date   NA 138 07/30/2018   K 3.7 07/30/2018   CL 102 07/30/2018   CO2 27 07/30/2018   GLUCOSE 91 07/30/2018   BUN 15 07/30/2018   CREATININE 0.99 07/30/2018   CALCIUM 9.7 07/30/2018   PROT 6.6 07/30/2018   ALBUMIN 4.2 07/30/2018   AST 14 07/30/2018   ALT 12 07/30/2018   ALKPHOS 58 07/30/2018   BILITOT 0.3 07/30/2018   GFRNONAA >60 07/05/2018   GFRAA  >60 07/05/2018    CEA on 07/30/2018: 0.5  Imaging:  As per HPI, CT images from 08/01/2018 reviewed with [name] and his wife   Assessment/Plan:   1. Adenocarcinoma of the distal esophagus/GE junction/gastric cardia  Upper endoscopy 07/25/2018-esophageal mass at 38-41 cm extending to the GE junction and gastric cardia, biopsy confirmed adenocarcinoma  CTs 08/01/2018- wall thickening at the distal esophagus extending to the GE junction and gastric cardia, gastrohepatic ligament lymphadenopathy, perigastric lymph node, too small to characterize liver lesion, bilateral adrenal adenomas  2. Iron deficiency anemia secondary to #1 3. Colon polyps, tubular adenomas, identified on colonoscopy 07/25/2018 4. Hypertension 5. Hyperlipidemia 6. Tobacco use   Disposition:   [name] [name] has been diagnosed with gastroesophageal carcinoma.  Appears to have locally advanced disease based on the staging evaluation to date.  I discussed the treatment options and prognosis with [name] [name] and his wife.  He is scheduled to see Dr. Lisbeth Renshaw on 08/07/2018.  He will undergo a staging PET scan 08/09/2018.  I recommend concurrent radiation and weekly Taxol/carboplatin if the PET scan does not reveal evidence of distant metastatic disease.  I will present his case at the GI tumor conference and he will be referred to Dr. Servando Snare.  I reviewed potential toxicities associated with the Taxol/carboplatin regimen including the chance for nausea/vomiting, mucositis, diarrhea, alopecia, and hematologic toxicity.  We discussed the allergic reaction, bone pain, ototoxicity, neuropathy associated with Taxol.  We discussed the allergic reaction seen with carboplatin.  He agrees to proceed.  He will attend a chemotherapy teaching class.  He will be scheduled for a first cycle of weekly Taxol/carboplatin on 08/20/2018.  The preliminary plan is to begin radiation on 08/19/2018.  A chemotherapy plan was entered today.  Betsy Coder, MD   08/05/2018, 2:20 PM

## 2018-08-05 NOTE — Telephone Encounter (Signed)
Scheduled appt per 2/17 los. ° °Printed calendar and avs. °

## 2018-08-06 ENCOUNTER — Other Ambulatory Visit: Payer: Self-pay | Admitting: Oncology

## 2018-08-07 ENCOUNTER — Ambulatory Visit
Admission: RE | Admit: 2018-08-07 | Discharge: 2018-08-07 | Disposition: A | Discharge status: Home / Self Care | Payer: Medicare Other | Source: Ambulatory Visit | Attending: Radiation Oncology | Admitting: Radiation Oncology

## 2018-08-07 ENCOUNTER — Encounter: Payer: Self-pay | Admitting: Radiation Oncology

## 2018-08-07 ENCOUNTER — Other Ambulatory Visit: Payer: Self-pay

## 2018-08-07 ENCOUNTER — Other Ambulatory Visit: Payer: Self-pay | Admitting: *Deleted

## 2018-08-07 VITALS — BP 119/71 | HR 76 | Temp 98.2°F | Resp 18 | Ht 71.0 in | Wt 172.6 lb

## 2018-08-07 DIAGNOSIS — C155 Malignant neoplasm of lower third of esophagus: Secondary | ICD-10-CM | POA: Diagnosis not present

## 2018-08-07 DIAGNOSIS — E785 Hyperlipidemia, unspecified: Secondary | ICD-10-CM | POA: Insufficient documentation

## 2018-08-07 DIAGNOSIS — I10 Essential (primary) hypertension: Secondary | ICD-10-CM | POA: Diagnosis not present

## 2018-08-07 DIAGNOSIS — F17210 Nicotine dependence, cigarettes, uncomplicated: Secondary | ICD-10-CM | POA: Insufficient documentation

## 2018-08-07 DIAGNOSIS — D500 Iron deficiency anemia secondary to blood loss (chronic): Secondary | ICD-10-CM

## 2018-08-07 DIAGNOSIS — C160 Malignant neoplasm of cardia: Secondary | ICD-10-CM

## 2018-08-07 MED ORDER — DEXAMETHASONE 2 MG PO TABS: 10.0000 mg | ORAL_TABLET | ORAL | 0 refills | Status: DC

## 2018-08-07 MED ORDER — PROCHLORPERAZINE MALEATE 10 MG PO TABS: 10.0000 mg | ORAL_TABLET | Freq: Four times a day (QID) | ORAL | 1 refills | Status: DC | PRN

## 2018-08-07 NOTE — Progress Notes (Addendum)
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 Low RBCs were found. PCP, Billey Gosling,  referred for colonoscopy (negative) and ENT/endoscopy.   PET 08/09/2018  CT CAP 08/01/2018: wall thickening at the distal esophagus extending into the gastroesophageal juction.  No suspicious pulmonary nodule or mass.  A 7 mm low density lesion in the dome of the liver is too small to characterize.  There is thickening of the adrenal glands bilaterally, left greater than the right.  A 2.8 x 1.3 cm node versus nodal conglomeration is identified in the gastropathic ligament.  There is a 7 x 11 mm perigastric node.  The prostate gland is enlarged.  Bilateral groin hernias containing fat.  Colonoscopy 07/25/2018: sessile polyps in the ascending colon and cecum.  A large ulcerated mass with bleeding and stigmata of bleeding was noted in the distal esophagus, GE junction, and in the gastric cardia.  The mass was noted at 38 cm from the incisors.  Non-obstructing.  Biopsies of Esophagus 07/25/2018   Past/Anticipated interventions by surgeon, if any:   Past/Anticipated interventions by medical oncology, if any:  Dr. Benay Spice 08/05/2018 -[name] [name] appears to have locally advanced disease based on the staging evaluation to date. -I discussed the treatment options and prognosis with [name] [name].  He is scheduled to see Dr. Lisbeth Renshaw on 08/07/2018.  He will undergo a staging PET scan on 08/09/2018. -I recommend concurrent radiation and weekly taxol/carboplatin if the PET scan does not reveal evidence of distant metastatic disease.   -I will present his case at the GI tumor conference and he will be referred to Dr. Servando Snare. -He will be scheduled for a first cycle of weekly taxol/carboplatin on 08/20/2018.  The preliminary plan is to begin radiation on 08/19/2018.   Weight changes, if any:  denies  Bowel/Bladder complaints, if any: denies diarrhea, constipation, nausea or vomiting. Patient reports a normal appetite but [name] reports he is eating less.   Nausea / Vomiting, if any: denies  Pain issues, if any:  denies  Any blood per rectum:   denies  BP 119/71   Pulse 76   Temp 98.2 F (36.8 C) (Oral)   Resp 18   Ht 5\' 11"  (1.803 m)   Wt 172 lb 9.6 oz (78.3 kg)   SpO2 100%   BMI 24.07 kg/m  Wt Readings from Last 3 Encounters:  08/07/18 172 lb 9.6 oz (78.3 kg)  08/05/18 172 lb 9.6 oz (78.3 kg)  07/25/18 177 lb (80.3 kg)      SAFETY ISSUES:  Prior radiation? no  Pacemaker/ICD? no  Possible current pregnancy? no  Is the patient on methotrexate? no  Current Complaints/Details: 68 year old male. Married. Resides in [place]. Has no children. Retired. Enjoys volunteering.  08/09/2018  PET 08/20/2018 [name] consult 08/21/2018 Starts chemo

## 2018-08-07 NOTE — Progress Notes (Signed)
Per Dr. Benay Spice: Send scripts for Decadron premeds for 1st Taxol and compazine.

## 2018-08-07 NOTE — Progress Notes (Signed)
Radiation Oncology         [phone]) [phone] ________________________________  Name: [name] [name]        MRN: [id number]  Date of Service: 08/07/2018 DOB: 1951-01-13  CC:Binnie Rail, MD  Ladell Pier, MD     REFERRING PHYSICIAN: Ladell Pier, MD   DIAGNOSIS: The encounter diagnosis was Adenocarcinoma of gastroesophageal junction ([place]).   HISTORY OF PRESENT ILLNESS: Ladarious Kresse is a 68 y.o. male seen at the request of Dr. Benay Spice for a newly diagnosed adenocarcinoma of the distal esophagus. The patient has been in his usual state of health but was found by his PCP to be anemic with iron deficiency. She recommended GI evaluation and he was seen by Dr. [name] who recommended colonoscopy and endoscopy which was performed on 07/25/2018. He had two polyps in the colon along the ascending colon and the cecum, in the esophagus he had a large submucosal and ulcerating mass with stigmata of recent bleeding in the distal esophagus 38 cm from the incisors. There was patchy nodularity of the distal esophagus was also noted. Biopsies of the esophageal tumor revealed an adenocarcinoma, and his colon polyps revealed tubular adenomatous changes. He has undergone CT of the C/A/P on 08/01/2018 which revealed circumferential wall thickening of the distal esophagus extending into the GE junction and gastric cardia and a 2.8 x 1.3 cm collection of small nodes versus a single enlarged node in the gastrohepatic ligament. There was a 7 x 11 mm perigastric node as well. There were cystic appearing changes also in the liver. There was also nodular thickening bilaterally of the adrenal glands. He has met with Dr. Benay Spice and is scheduled to meet with Dr. Servando Snare on Tuesday of next week as well. He comes today to discuss options of treatment of his cancer.    PREVIOUS RADIATION THERAPY: No   PAST MEDICAL HISTORY:  Past Medical History:  Diagnosis Date  . Adenocarcinoma of gastroesophageal junction ([place])  07/29/2018  . Anemia   . Diverticulosis   . Hyperlipidemia   . Hypertension   . Tobacco abuse        PAST SURGICAL HISTORY: Past Surgical History:  Procedure Laterality Date  . COLONOSCOPY    . WISDOM TOOTH EXTRACTION       FAMILY HISTORY:  Family History  Problem Relation Age of Onset  . Cancer Mother        bone cancer  . Heart attack Father 2  . Cancer Sister        uterine cancer  . Heart attack Paternal Uncle        X2, both < 55  . COPD Neg Hx   . Diabetes Neg Hx   . Stroke Neg Hx   . Colon cancer Neg Hx      SOCIAL HISTORY:  reports that he has been smoking cigarettes. He has a 12.50 pack-year smoking history. He has never used smokeless tobacco. He reports current alcohol use of about 2.0 standard drinks of alcohol per week. He reports that he does not use drugs.   ALLERGIES: Patient has no known allergies.   MEDICATIONS:  Current Outpatient Medications  Medication Sig Dispense Refill  . ferrous sulfate 325 (65 FE) MG tablet Take 325 mg by mouth 2 (two) times daily with a meal.    . lisinopril-hydrochlorothiazide (PRINZIDE,ZESTORETIC) 20-25 MG tablet Take 1 tablet by mouth daily. 90 tablet 3   No current facility-administered medications for this encounter.      REVIEW OF  SYSTEMS: On review of systems, the patient reports that he is doing well overall. He states he has noticed a few episodes of discomfort swallowing but has not had any progressive symptoms and just noticed this about a week ago. He denies any chest pain, shortness of breath, cough, fevers, chills, night sweats, unintended weight changes. He denies any bowel or bladder disturbances, and denies abdominal pain, nausea or vomiting. He denies any new musculoskeletal or joint aches or pains. A complete review of systems is obtained and is otherwise negative.     PHYSICAL EXAM:  Wt Readings from Last 3 Encounters:  08/07/18 172 lb 9.6 oz (78.3 kg)  08/05/18 172 lb 9.6 oz (78.3 kg)  07/25/18  177 lb (80.3 kg)   Temp Readings from Last 3 Encounters:  08/07/18 98.2 F (36.8 C) (Oral)  08/05/18 98.5 F (36.9 C) (Oral)  07/25/18 98 F (36.7 C)   BP Readings from Last 3 Encounters:  08/07/18 119/71  08/05/18 110/75  07/25/18 (!) 106/59   Pulse Readings from Last 3 Encounters:  08/07/18 76  08/05/18 80  07/25/18 73   Pain Assessment Pain Score: 0-No pain/10  In general this is a well appearing caucasian male in no acute distress. He is alert and oriented x4 and appropriate throughout the examination. HEENT reveals that the patient is normocephalic, atraumatic. EOMs are intact. Skin is intact without any evidence of gross lesions. Cardiopulmonary assessment is negative for acute distress and he exhibits normal effort.    ECOG = 1  0 - Asymptomatic (Fully active, able to carry on all predisease activities without restriction)  1 - Symptomatic but completely ambulatory (Restricted in physically strenuous activity but ambulatory and able to carry out work of a light or sedentary nature. For example, light housework, office work)  2 - Symptomatic, <50% in bed during the day (Ambulatory and capable of all self care but unable to carry out any work activities. Up and about more than 50% of waking hours)  3 - Symptomatic, >50% in bed, but not bedbound (Capable of only limited self-care, confined to bed or chair 50% or more of waking hours)  4 - Bedbound (Completely disabled. Cannot carry on any self-care. Totally confined to bed or chair)  5 - Death   Eustace Pen MM, Creech RH, Tormey DC, et al. [phone]). "Toxicity and response criteria of the [facility]". [place] Oncol. 5 (6): 649-55    LABORATORY DATA:  Lab Results  Component Value Date   WBC 9.7 07/30/2018   HGB 12.9 (L) 07/30/2018   HCT 39.7 07/30/2018   MCV 80.6 07/30/2018   PLT 406.0 (H) 07/30/2018   Lab Results  Component Value Date   NA 138 07/30/2018   K 3.7 07/30/2018   CL 102  07/30/2018   CO2 27 07/30/2018   Lab Results  Component Value Date   ALT 12 07/30/2018   AST 14 07/30/2018   ALKPHOS 58 07/30/2018   BILITOT 0.3 07/30/2018      RADIOGRAPHY: Ct Chest W Contrast  Result Date: 08/01/2018 CLINICAL DATA:  Primary adenocarcinoma of the esophagogastric junction. EXAM: CT CHEST, ABDOMEN, AND PELVIS WITH CONTRAST TECHNIQUE: Multidetector CT imaging of the chest, abdomen and pelvis was performed following the standard protocol during bolus administration of intravenous contrast. CONTRAST:  183m ISOVUE-300 IOPAMIDOL (ISOVUE-300) INJECTION 61% COMPARISON:  No comparison studies available. FINDINGS: CT CHEST FINDINGS Cardiovascular: The heart size is normal. No substantial pericardial effusion. Coronary artery calcification is evident. Atherosclerotic  calcification is noted in the wall of the thoracic aorta. Mediastinum/Nodes: No mediastinal lymphadenopathy. There is no hilar lymphadenopathy. Mild wall thickening noted distal esophagus extending into the esophagogastric junction. There is no axillary lymphadenopathy. Lungs/Pleura: The central tracheobronchial airways are patent. No suspicious pulmonary nodule or mass. No focal airspace consolidation. No pulmonary edema or pleural effusion. Musculoskeletal: No worrisome lytic or sclerotic osseous abnormality. CT ABDOMEN PELVIS FINDINGS Hepatobiliary: 15 mm water density lesion in the medial segment left liver is probably a cyst. 7 mm low-density lesion in the dome of the liver (45/2) is too small to characterize. No overtly suspicious or enhancing liver lesion evident. There is no evidence for gallstones, gallbladder wall thickening, or pericholecystic fluid. No intrahepatic or extrahepatic biliary dilation. Pancreas: No focal mass lesion. No dilatation of the main duct. No intraparenchymal cyst. No peripancreatic edema. Spleen: No splenomegaly. No focal mass lesion. Adrenals/Urinary Tract: Thickening of the adrenal glands evident  bilaterally, left greater than right. Dominant nodular component in the left adrenal gland measures 2.8 cm. Relative washout for this dominant nodular component in the left adrenal gland is 56%, most suggestive of adenoma. Kidneys unremarkable. No evidence for hydroureter. The urinary bladder appears normal for the degree of distention. Stomach/Bowel: Wall thickening is noted in the esophagogastric junction/cardia. Stomach otherwise unremarkable. Duodenum is normally positioned as is the ligament of Treitz. No small bowel wall thickening. No small bowel dilatation. The terminal ileum is normal. The appendix is normal. No gross colonic mass. No colonic wall thickening. Vascular/Lymphatic: There is abdominal aortic atherosclerosis without aneurysm. 2.8 x 1.3 cm node versus nodal conglomeration identified in the gastrohepatic ligament (53/2). Small 7 x 11 mm perigastric node visible on 51/2. No hepato duodenal ligament lymphadenopathy. Small para-aortic retroperitoneal nodes evident without retroperitoneal lymphadenopathy. Reproductive: Prostate gland is enlarged. Other: No intraperitoneal free fluid. Musculoskeletal: Bilateral groin hernias contain only fat. Tiny sclerotic focus right ischial tuberosity, likely a bone island. No worrisome lytic or sclerotic osseous abnormality. Degenerative changes noted diffusely in the lumbar spine. IMPRESSION: 1. Circumferential wall thickening noted in the distal esophagus extending into the esophagogastric junction and gastric cardia compatible with patient's known neoplasm. 2. 2.8 x 1.3 cm collection of small nodes versus single irregular enlarged lymph node in the gastrohepatic ligament concerning for metastatic disease. There is an adjacent 7 x 11 mm perigastric node, also concerning. 3. 15 mm lesion in the medial segment left liver has imaging features most suggestive of a simple cyst. 7 mm low-density lesion in the dome of the liver, too small to characterize and attention  on follow-up recommended. 4. Bilateral nodular thickening of the adrenal glands with imaging features most suggestive of adenomatous enlargement. 5.  Aortic Atherosclerois (ICD10-170.0) Electronically Signed   By: Misty Stanley M.D.   On: 08/01/2018 11:41   Ct Abdomen Pelvis W Contrast  Result Date: 08/01/2018 CLINICAL DATA:  Primary adenocarcinoma of the esophagogastric junction. EXAM: CT CHEST, ABDOMEN, AND PELVIS WITH CONTRAST TECHNIQUE: Multidetector CT imaging of the chest, abdomen and pelvis was performed following the standard protocol during bolus administration of intravenous contrast. CONTRAST:  182m ISOVUE-300 IOPAMIDOL (ISOVUE-300) INJECTION 61% COMPARISON:  No comparison studies available. FINDINGS: CT CHEST FINDINGS Cardiovascular: The heart size is normal. No substantial pericardial effusion. Coronary artery calcification is evident. Atherosclerotic calcification is noted in the wall of the thoracic aorta. Mediastinum/Nodes: No mediastinal lymphadenopathy. There is no hilar lymphadenopathy. Mild wall thickening noted distal esophagus extending into the esophagogastric junction. There is no axillary lymphadenopathy. Lungs/Pleura: The central  tracheobronchial airways are patent. No suspicious pulmonary nodule or mass. No focal airspace consolidation. No pulmonary edema or pleural effusion. Musculoskeletal: No worrisome lytic or sclerotic osseous abnormality. CT ABDOMEN PELVIS FINDINGS Hepatobiliary: 15 mm water density lesion in the medial segment left liver is probably a cyst. 7 mm low-density lesion in the dome of the liver (45/2) is too small to characterize. No overtly suspicious or enhancing liver lesion evident. There is no evidence for gallstones, gallbladder wall thickening, or pericholecystic fluid. No intrahepatic or extrahepatic biliary dilation. Pancreas: No focal mass lesion. No dilatation of the main duct. No intraparenchymal cyst. No peripancreatic edema. Spleen: No splenomegaly. No  focal mass lesion. Adrenals/Urinary Tract: Thickening of the adrenal glands evident bilaterally, left greater than right. Dominant nodular component in the left adrenal gland measures 2.8 cm. Relative washout for this dominant nodular component in the left adrenal gland is 56%, most suggestive of adenoma. Kidneys unremarkable. No evidence for hydroureter. The urinary bladder appears normal for the degree of distention. Stomach/Bowel: Wall thickening is noted in the esophagogastric junction/cardia. Stomach otherwise unremarkable. Duodenum is normally positioned as is the ligament of Treitz. No small bowel wall thickening. No small bowel dilatation. The terminal ileum is normal. The appendix is normal. No gross colonic mass. No colonic wall thickening. Vascular/Lymphatic: There is abdominal aortic atherosclerosis without aneurysm. 2.8 x 1.3 cm node versus nodal conglomeration identified in the gastrohepatic ligament (53/2). Small 7 x 11 mm perigastric node visible on 51/2. No hepato duodenal ligament lymphadenopathy. Small para-aortic retroperitoneal nodes evident without retroperitoneal lymphadenopathy. Reproductive: Prostate gland is enlarged. Other: No intraperitoneal free fluid. Musculoskeletal: Bilateral groin hernias contain only fat. Tiny sclerotic focus right ischial tuberosity, likely a bone island. No worrisome lytic or sclerotic osseous abnormality. Degenerative changes noted diffusely in the lumbar spine. IMPRESSION: 1. Circumferential wall thickening noted in the distal esophagus extending into the esophagogastric junction and gastric cardia compatible with patient's known neoplasm. 2. 2.8 x 1.3 cm collection of small nodes versus single irregular enlarged lymph node in the gastrohepatic ligament concerning for metastatic disease. There is an adjacent 7 x 11 mm perigastric node, also concerning. 3. 15 mm lesion in the medial segment left liver has imaging features most suggestive of a simple cyst. 7 mm  low-density lesion in the dome of the liver, too small to characterize and attention on follow-up recommended. 4. Bilateral nodular thickening of the adrenal glands with imaging features most suggestive of adenomatous enlargement. 5.  Aortic Atherosclerois (ICD10-170.0) Electronically Signed   By: Misty Stanley M.D.   On: 08/01/2018 11:41       IMPRESSION/PLAN: 1. Adenocarcinoma of the GE junction. Dr. Lisbeth Renshaw discusses the pathology findings and reviews the nature of esophageal/GE junction cancer. Dr. Lisbeth Renshaw recommends proceeding with PET imaging to further characterize his disease. If he has locally advanced disease which is what we anticipate, he would benefit from radiotherapy with chemosensitization. He may be a candidate for esophagectomy as well and will see Dr. Servando Snare next Tuesday. We discussed the risks, benefits, short, and long term effects of radiotherapy, and the patient is interested in proceeding. Dr. Lisbeth Renshaw discusses the delivery and logistics of radiotherapy and anticipates a course of 5 1/2 weeks of radiotherapy. We will follow up with the results of his PET imaging. We anticipate simulation in the next week, and would anticipate starting radiotherapy to begin on 08/21/2018 along with his chemotherapy. 2. Iron deficiency anemia. The patient is interested in discontinuing his iron. His last hgb on 07/30/2018 was  12.9. I suggested he discuss this with Dr. Benay Spice but could likely discontinue this as radiotherapy will help with hemostasis of his tumor.  In a visit lasting 60 minutes, greater than 50% of the time was spent face to face discussing his case, and coordinating the patient's care.   The above documentation reflects my direct findings during this shared patient visit. Please see the separate note by Dr. Lisbeth Renshaw on this date for the remainder of the patient's plan of care.    Carola Rhine, PAC

## 2018-08-07 NOTE — Progress Notes (Signed)
See progress note under physician encounter. 

## 2018-08-08 ENCOUNTER — Inpatient Hospital Stay: Payer: Medicare Other

## 2018-08-08 ENCOUNTER — Encounter: Payer: Self-pay | Admitting: Oncology

## 2018-08-08 NOTE — Progress Notes (Signed)
Met with patient and spouse to introduce myself as Arboriculturist and to offer available resources.  Discussed one-time $65 Engineer, drilling to assist with personal expenses wile going through treatment. His spouse states they are over the income based on the guidelines.  Gave them my card for any additional financial questions or concerns.

## 2018-08-09 ENCOUNTER — Ambulatory Visit
Admission: RE | Admit: 2018-08-09 | Discharge: 2018-08-09 | Disposition: A | Discharge status: Home / Self Care | Payer: Medicare Other | Source: Ambulatory Visit | Attending: Radiation Oncology | Admitting: Radiation Oncology

## 2018-08-09 ENCOUNTER — Encounter (HOSPITAL_COMMUNITY)
Admission: RE | Admit: 2018-08-09 | Discharge: 2018-08-09 | Disposition: A | Discharge status: Home / Self Care | Payer: Medicare Other | Source: Ambulatory Visit | Attending: Radiation Oncology | Admitting: Radiation Oncology

## 2018-08-09 DIAGNOSIS — N400 Enlarged prostate without lower urinary tract symptoms: Secondary | ICD-10-CM | POA: Diagnosis not present

## 2018-08-09 DIAGNOSIS — D3501 Benign neoplasm of right adrenal gland: Secondary | ICD-10-CM | POA: Diagnosis not present

## 2018-08-09 DIAGNOSIS — C160 Malignant neoplasm of cardia: Secondary | ICD-10-CM | POA: Insufficient documentation

## 2018-08-09 DIAGNOSIS — D3502 Benign neoplasm of left adrenal gland: Secondary | ICD-10-CM | POA: Diagnosis not present

## 2018-08-09 DIAGNOSIS — I700 Atherosclerosis of aorta: Secondary | ICD-10-CM | POA: Insufficient documentation

## 2018-08-09 DIAGNOSIS — I2510 Atherosclerotic heart disease of native coronary artery without angina pectoris: Secondary | ICD-10-CM | POA: Insufficient documentation

## 2018-08-09 DIAGNOSIS — J320 Chronic maxillary sinusitis: Secondary | ICD-10-CM | POA: Diagnosis not present

## 2018-08-09 LAB — GLUCOSE, CAPILLARY: Glucose-Capillary: 103 mg/dL — ABNORMAL HIGH (ref 70–99)

## 2018-08-09 MED ORDER — FLUDEOXYGLUCOSE F - 18 (FDG) INJECTION: 8.4000 | Freq: Once | INTRAVENOUS | Status: DC | PRN

## 2018-08-12 ENCOUNTER — Other Ambulatory Visit: Payer: Self-pay | Admitting: Oncology

## 2018-08-12 ENCOUNTER — Telehealth: Payer: Self-pay | Admitting: Radiation Oncology

## 2018-08-12 NOTE — Telephone Encounter (Signed)
I spoke with the patient's wife who called with questions about the PET scan. We reviewed the results and the plans to move forward with chemoRT. She was encouraged to call back if there were more questions that her husband had when he returned home.

## 2018-08-14 ENCOUNTER — Ambulatory Visit: Payer: Medicare Other | Admitting: Nurse Practitioner

## 2018-08-16 DIAGNOSIS — Z510 Encounter for antineoplastic radiation therapy: Secondary | ICD-10-CM | POA: Insufficient documentation

## 2018-08-16 DIAGNOSIS — C160 Malignant neoplasm of cardia: Secondary | ICD-10-CM | POA: Insufficient documentation

## 2018-08-17 ENCOUNTER — Other Ambulatory Visit: Payer: Self-pay | Admitting: Oncology

## 2018-08-18 ENCOUNTER — Other Ambulatory Visit: Payer: Self-pay | Admitting: Oncology

## 2018-08-19 ENCOUNTER — Institutional Professional Consult (permissible substitution): Payer: Medicare Other | Admitting: Cardiothoracic Surgery

## 2018-08-19 VITALS — BP 116/76 | HR 73 | Resp 20 | Ht 71.0 in | Wt 172.0 lb

## 2018-08-19 DIAGNOSIS — C155 Malignant neoplasm of lower third of esophagus: Secondary | ICD-10-CM | POA: Diagnosis not present

## 2018-08-19 NOTE — Progress Notes (Signed)
[address]       ,[place] [address]             [phone]                    [name]  Medical Record #[id number] Date of Birth: 08-14-1950  Referring: Ladell Pier, MD Primary Care: Binnie Rail, MD Primary Cardiologist: No primary care provider on file.  Chief Complaint:    Chief Complaint  Patient presents with  . Esophageal Cancer    Surgical eval, Upper Endoscopy 07/25/18, C/A/P CT 08/01/18, PET Scan 08/09/18    History of Present Illness:    [name] [name] 68 y.o. 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  The patient's disease first came to medical attention when he presented to his primary care doctor with new anemia, repeat labs confirmed anemia.  Patient denies any known observed blood in his stool.  He denies any loss of weight, he has noted some very mild discomfort with swallowing, but not to the extent of interfering with his diet   Colonoscopy and upper GI endoscopy were performed.  Upper GI endoscopy showed  A large, submucosal and ulcerating mass with bleeding and stigmata of recent bleeding was found in the distal esophagus, at the gastroesophageal junction and in the cardia, 38 cm from the incisors.  CT and PET scans have been done. 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  He notes early in his career in the 1980s he did do some work at the power plant that could have exposed him to asbestos.  Patient has been a smoker for proximately half a pack a day for 50 years. He denies any previous history of reflux  Notes that he has never missed a day of work his whole career from illness.  Current Activity/ Functional Status:  Patient is independent with mobility/ambulation, transfers, ADL's, IADL's.   Zubrod Score: At the time of  surgery this patient's most appropriate activity status/level should be described as: [x]     0    Normal activity, no symptoms []     1    Restricted in physical strenuous activity but ambulatory, able to do out light work []     2    Ambulatory and capable of self care, unable to do work activities, up and about               >50 % of waking hours                              []     3    Only limited self care, in bed greater than 50% of waking hours []     4    Completely disabled, no self care, confined to bed or chair []     5    Moribund   Past Medical History:  Diagnosis Date  . Adenocarcinoma of gastroesophageal junction ([place]) 07/29/2018  . Anemia   . Diverticulosis   . Hyperlipidemia   . Hypertension   . Tobacco abuse     Past Surgical History:  Procedure Laterality Date  . COLONOSCOPY    . WISDOM TOOTH EXTRACTION      Family History  Problem Relation Age of Onset  . Cancer Mother        bone cancer  . Heart attack Father 28  . Cancer Sister        uterine cancer  . Heart attack Paternal Uncle        X2, both < 55  . COPD Neg Hx   . Diabetes Neg Hx   . Stroke Neg Hx   . Colon cancer Neg Hx    Patient's mother died in her 66s of "bone cancer she had one sister died of cancer of unknown cause his father died at age 22 grandfather died at age 59 and one brother died at age 61 all of sudden death presumed to be a myocardial infarction  Social History   Tobacco Use  Smoking Status Current Every Day Smoker  . Packs/day: 0.25  . Years: 50.00  . Pack years: 12.50  . Types: Cigarettes  Smokeless Tobacco Never Used  Tobacco Comment   06/02/14 2-3 pp week    Social History   Substance and Sexual Activity  Alcohol Use Yes  . Alcohol/week: 2.0 standard drinks  . Types: 2 Glasses of wine per week   Comment:  occasionally, not daily     No Known Allergies  Current Outpatient Medications  Medication Sig Dispense Refill  . dexamethasone (DECADRON) 2 MG tablet Take  5 tablets (10 mg total) by mouth as directed. Take at 10pm night before and 6am day of 1st Taxol 10 tablet 0  . ferrous sulfate 325 (65 FE) MG tablet Take 325 mg by mouth 2 (two) times daily with a meal.    . lisinopril-hydrochlorothiazide (PRINZIDE,ZESTORETIC) 20-25 MG tablet Take 1 tablet by mouth daily. 90 tablet 3  . prochlorperazine (COMPAZINE) 10 MG tablet TAKE 1 TABLET(10 MG) BY MOUTH EVERY 6 HOURS AS NEEDED 20 tablet 1   No current facility-administered medications for this visit.     Pertinent items are noted in HPI.   Review of Systems:     Cardiac Review of Systems: [Y] = yes  or   [ N ] = no   Chest Pain [ n  ]  Resting SOB [ n  ] Exertional SOB  [n  ]  Orthopnea [  ]   Pedal Edema [ n  ]    Palpitations [n  ] Syncope  [ n ]   Presyncope [ n  ]   General Review of Systems: [Y] = yes [  ]=no Constitional: recent weight change [none  ];  Wt loss over the last 3 months [   ] anorexia [  ]; fatigue [  ]; nausea [  ]; night sweats [  ]; fever [  ]; or chills [  ];           Eye : blurred vision [  ]; diplopia [   ]; vision changes [  ];  Amaurosis fugax[  ]; Resp: cough [  ];  wheezing[  ];  hemoptysis[  ]; shortness of breath[  ]; paroxysmal nocturnal dyspnea[  ]; dyspnea on exertion[  ]; or orthopnea[  ];  GI:  gallstones[  ], vomiting[  ];  dysphagia[  ]; melena[  ];  hematochezia [  ]; heartburn[  ];   Hx of  Colonoscopy[  ]; GU: kidney stones [  ]; hematuria[  ];   dysuria [  ];  nocturia[  ];  history of     obstruction [  ]; urinary frequency [  ]  Skin: rash, swelling[  ];, hair loss[  ];  peripheral edema[  ];  or itching[  ]; Musculosketetal: myalgias[  ];  joint swelling[  ];  joint erythema[  ];  joint pain[  ];  back pain[  ];  Heme/Lymph: bruising[  ];  bleeding[  ];  anemia[  ];  Neuro: TIA[  ];  headaches[  ];  stroke[  ];  vertigo[  ];  seizures[  ];   paresthesias[  ];  difficulty walking[  ];  Psych:depression[  ]; anxiety[  ];  Endocrine: diabetes[  ];   thyroid dysfunction[  ];  Immunizations: Flu up to date Blue.Reese  ]; Pneumococcal up to date [ y ];  Other:     PHYSICAL EXAMINATION: BP 116/76   Pulse 73   Resp 20   Ht 5\' 11"  (1.803 m)   Wt 172 lb (78 kg)   SpO2 95% Comment: RA  BMI 23.99 kg/m  General appearance: alert, cooperative, appears stated age and no distress Head: Normocephalic, without obvious abnormality, atraumatic Neck: no adenopathy, no carotid bruit, no JVD, supple, symmetrical, trachea midline and thyroid not enlarged, symmetric, no tenderness/mass/nodules Lymph nodes: Cervical, supraclavicular, and axillary nodes normal. Resp: clear to auscultation bilaterally Back: symmetric, no curvature. ROM normal. No CVA tenderness. Cardio: regular rate and rhythm, S1, S2 normal, no murmur, click, rub or gallop GI: soft, non-tender; bowel sounds normal; no masses,  no organomegaly Extremities: extremities normal, atraumatic, no cyanosis or edema and Homans sign is negative, no sign of DVT Neurologic: Grossly normal  Diagnostic Studies & Laboratory data:     Recent Radiology Findings:    Ct Chest W ContrastCt Abdomen Pelvis W Contrast  Result Date: 08/01/2018 CLINICAL DATA:  Primary adenocarcinoma of the esophagogastric junction. EXAM: CT CHEST, ABDOMEN, AND PELVIS WITH CONTRAST TECHNIQUE: Multidetector CT imaging of the chest, abdomen and pelvis was performed following the standard protocol during bolus administration of intravenous contrast. CONTRAST:  1105mL ISOVUE-300 IOPAMIDOL (ISOVUE-300) INJECTION 61% COMPARISON:  No comparison studies available. FINDINGS: CT CHEST FINDINGS Cardiovascular: The heart size is normal. No substantial pericardial effusion. Coronary artery calcification is evident. Atherosclerotic calcification is noted in the wall of the thoracic aorta. Mediastinum/Nodes: No mediastinal lymphadenopathy. There is no hilar lymphadenopathy. Mild wall thickening noted distal esophagus extending into the  esophagogastric junction. There is no axillary lymphadenopathy. Lungs/Pleura: The central tracheobronchial airways are patent. No suspicious pulmonary nodule or mass. No focal airspace consolidation. No pulmonary edema or pleural effusion. Musculoskeletal: No worrisome lytic or sclerotic osseous abnormality. CT ABDOMEN PELVIS FINDINGS Hepatobiliary: 15 mm water density lesion in the medial segment left liver is probably a cyst. 7 mm low-density lesion in the dome of the liver (45/2) is too small to characterize. No overtly suspicious or enhancing liver lesion evident. There is no evidence for gallstones, gallbladder wall thickening, or pericholecystic fluid. No intrahepatic or extrahepatic biliary dilation. Pancreas: No focal mass lesion. No dilatation of the main duct. No intraparenchymal cyst. No peripancreatic edema. Spleen: No splenomegaly. No focal mass lesion. Adrenals/Urinary Tract: Thickening of the adrenal glands evident bilaterally, left greater than right. Dominant nodular component in the left adrenal gland measures 2.8 cm. Relative washout for this dominant nodular component in the left adrenal gland is 56%, most suggestive of adenoma. Kidneys unremarkable. No evidence for hydroureter. The urinary bladder appears normal for the degree of distention. Stomach/Bowel: Wall thickening is noted in the esophagogastric junction/cardia. Stomach otherwise unremarkable. Duodenum is normally positioned as is the ligament of Treitz.  No small bowel wall thickening. No small bowel dilatation. The terminal ileum is normal. The appendix is normal. No gross colonic mass. No colonic wall thickening. Vascular/Lymphatic: There is abdominal aortic atherosclerosis without aneurysm. 2.8 x 1.3 cm node versus nodal conglomeration identified in the gastrohepatic ligament (53/2). Small 7 x 11 mm perigastric node visible on 51/2. No hepato duodenal ligament lymphadenopathy. Small para-aortic retroperitoneal nodes evident without  retroperitoneal lymphadenopathy. Reproductive: Prostate gland is enlarged. Other: No intraperitoneal free fluid. Musculoskeletal: Bilateral groin hernias contain only fat. Tiny sclerotic focus right ischial tuberosity, likely a bone island. No worrisome lytic or sclerotic osseous abnormality. Degenerative changes noted diffusely in the lumbar spine. IMPRESSION: 1. Circumferential wall thickening noted in the distal esophagus extending into the esophagogastric junction and gastric cardia compatible with patient's known neoplasm. 2. 2.8 x 1.3 cm collection of small nodes versus single irregular enlarged lymph node in the gastrohepatic ligament concerning for metastatic disease. There is an adjacent 7 x 11 mm perigastric node, also concerning. 3. 15 mm lesion in the medial segment left liver has imaging features most suggestive of a simple cyst. 7 mm low-density lesion in the dome of the liver, too small to characterize and attention on follow-up recommended. 4. Bilateral nodular thickening of the adrenal glands with imaging features most suggestive of adenomatous enlargement. 5.  Aortic Atherosclerois (ICD10-170.0) Electronically Signed   By: Misty Stanley M.D.   On: 08/01/2018 11:41   Nm Pet Image Initial (pi) Skull Base To Thigh  Result Date: 08/09/2018 CLINICAL DATA:  Initial treatment strategy for esophageal cancer. EXAM: NUCLEAR MEDICINE PET SKULL BASE TO THIGH TECHNIQUE: 8.4 mCi F-18 FDG was injected intravenously. Full-ring PET imaging was performed from the skull base to thigh after the radiotracer. CT data was obtained and used for attenuation correction and anatomic localization. Fasting blood glucose: 103 mg/dl COMPARISON:  CT scan 08/01/2018 FINDINGS: Mediastinal blood pool activity: SUV max 2.6 NECK: No significant abnormal hypermetabolic activity in this region. Incidental CT findings: Complete opacification of the left frontal sinus and multiple left anterior ethmoid air cells, which could reflect  chronic or acute sinusitis. Subtotal opacification left maxillary sinus from chronic sinusitis. Suspected upper left nasal mucosal polyps. Bilateral common carotid artery atherosclerotic calcification. CHEST: The distal esophageal component of the mass spanning the gastroesophageal junction has a maximum SUV of 9.0. An AP window lymph node measuring 0.8 cm in short axis on image 66/4 has a maximum SUV of 1.9. A left axillary node with fatty hilum measuring 1.1 cm in short axis on image 61/4 has a maximum SUV of 1.8. Incidental CT findings: Atherosclerotic calcification of the aortic arch and left anterior descending coronary artery. Borderline cardiomegaly. ABDOMEN/PELVIS: The gastric portion of the gastroesophageal mass primarily involves the gastric cardia and has a maximum SUV of 9.6. Adjacent clustered lymph nodes in the gastrohepatic ligament are observed individually measuring up to 1.0 cm in short axis on image 107/4, and collectively with a maximum SUV of 3.2. The larger of the hypodense liver lesions is in the lateral segment left hepatic lobe and is photopenic, favoring a cyst. The smaller lesions are technically nonspecific due to small size, but demonstrate no appreciable hypermetabolic activity. 1.5 by 2.5 cm right adrenal adenoma. Thickened low-density left adrenal gland compatible with adenoma measuring about 4.4 by 1.9 cm. Neither adrenal gland is overtly hypermetabolic. Incidental CT findings: Aortoiliac atherosclerotic vascular disease. Prostatomegaly. SKELETON: No significant abnormal hypermetabolic activity in this region. Incidental CT findings: none IMPRESSION: 1. Hypermetabolic mass spanning  the gastroesophageal junction. The distal esophageal component has a maximum SUV of 9.0 and gastric cardia component has a maximum SUV of 9.6. Clustered gastrohepatic ligament lymph nodes are likely involved, with a maximum SUV of 3.2 which is mildly above the blood pool level. No compelling findings of  hepatic metastatic disease at this time. 2. Other imaging findings of potential clinical significance: Complete opacification of the left frontal sinus and multiple ethmoid air cells which could be from acute or chronic sinusitis. Subtotal opacification of the left maxillary sinus from chronic sinusitis. Bilateral adrenal adenomas. Aortic Atherosclerosis (ICD10-I70.0). Coronary atherosclerosis. Prostatomegaly. Electronically Signed   By: Van Clines M.D.   On: 08/09/2018 09:08     I have independently reviewed the above radiology studies  and reviewed the findings with the patient.   Recent Lab Findings: Lab Results  Component Value Date   WBC 9.7 07/30/2018   HGB 12.9 (L) 07/30/2018   HCT 39.7 07/30/2018   PLT 406.0 (H) 07/30/2018   GLUCOSE 91 07/30/2018   CHOL 161 05/06/2018   TRIG 134.0 05/06/2018   HDL 54.20 05/06/2018   LDLDIRECT 131.7 12/27/2009   LDLCALC 80 05/06/2018   ALT 12 07/30/2018   AST 14 07/30/2018   NA 138 07/30/2018   K 3.7 07/30/2018   CL 102 07/30/2018   CREATININE 0.99 07/30/2018   BUN 15 07/30/2018   CO2 27 07/30/2018   TSH 1.24 05/06/2018   INR 1.03 07/05/2018   HGBA1C 5.4 05/06/2018   ENDO:07/25/2018 A large, submucosal and ulcerating mass with bleeding and stigmata of recent bleeding was found in the distal esophagus, at the gastroesophageal junction and in the cardia, 38 cm from the incisors. The mass was non-obstructing. Biopsies were taken with a cold forceps for histology. Verification of patient identification for the specimen was done. Estimated blood loss was minimal. Findings: - Patchy mucosal changes characterized by nodularity were found in the distal esophagus. Biopsies were taken with a cold forceps for histology. Verification of patient identification for the specimen was done. Estimated blood loss was minimal. - The exam was otherwise without abnormality. Add'l Images: Upper Gastrointestinal Tract Lower  Third  Path: Diagnosis 1. Esophagogastric junction, biopsy, mass - ADENOCARCINOMA, SEE NOTE. 2. Esophagus, biopsy, distal esophagus nodular - ADENOCARCINOMA, SEE NOTE. 3. Colon, biopsy, cecum and ascending, polyp (2) - TUBULAR ADENOMA(S). - NEGATIVE FOR HIGH GRADE DYSPLASIA OR MALIGNANCY. Diagnosis Note 1. 1, 2. Dr Lyndon Code has reviewed this case and concurs with the above interpretation. Dr [name] was notified on 07/26/2018. (NK:ecj 07/26/2018) Jaquita Folds MD  Assessment / Plan:   #1 advanced stage adenocarcinoma involving the distal third of the esophagus and cardia of the stomach, GE junction, Siewert-Stein Type II  Likely clinicial  stage III     I discussed with the patient the diagnosis and the place that surgical resection placed in the treatment of adenocarcinoma of the distal esophagus and GE junction.  Patient was given printed material concerning surgical resection.  At this point he has no underlying medical problems that would prevent surgical resection.  I will plan to see him back in 1 month nearing the completion of his radiation therapy.  At that time we will coordinate with Dr. Benay Spice restaging studies and make a final decision about surgical resection based on the findings.   I  spent 60 minutes with  the patient face to face and greater then 50% of the time was spent in counseling and coordination of care.    Percell Miller  Maryruth Bun MD      [address] [place] [address] Office [phone]   Beeper [phone]  08/19/2018 4:57 PM

## 2018-08-20 ENCOUNTER — Encounter: Payer: Medicare Other | Admitting: Cardiothoracic Surgery

## 2018-08-20 ENCOUNTER — Other Ambulatory Visit: Payer: Self-pay

## 2018-08-20 ENCOUNTER — Inpatient Hospital Stay: Payer: Medicare Other

## 2018-08-20 ENCOUNTER — Inpatient Hospital Stay: Payer: Medicare Other | Admitting: Nutrition

## 2018-08-20 ENCOUNTER — Inpatient Hospital Stay: Payer: Medicare Other | Attending: Nurse Practitioner | Admitting: Nurse Practitioner

## 2018-08-20 ENCOUNTER — Encounter: Payer: Self-pay | Admitting: Nurse Practitioner

## 2018-08-20 VITALS — BP 109/72 | HR 63 | Temp 98.8°F | Resp 17 | Ht 71.0 in | Wt 166.8 lb

## 2018-08-20 DIAGNOSIS — Z5111 Encounter for antineoplastic chemotherapy: Secondary | ICD-10-CM | POA: Insufficient documentation

## 2018-08-20 DIAGNOSIS — Z720 Tobacco use: Secondary | ICD-10-CM | POA: Insufficient documentation

## 2018-08-20 DIAGNOSIS — D509 Iron deficiency anemia, unspecified: Secondary | ICD-10-CM | POA: Insufficient documentation

## 2018-08-20 DIAGNOSIS — C160 Malignant neoplasm of cardia: Secondary | ICD-10-CM

## 2018-08-20 DIAGNOSIS — R1310 Dysphagia, unspecified: Secondary | ICD-10-CM | POA: Diagnosis not present

## 2018-08-20 DIAGNOSIS — I10 Essential (primary) hypertension: Secondary | ICD-10-CM | POA: Diagnosis not present

## 2018-08-20 LAB — CBC WITH DIFFERENTIAL (CANCER CENTER ONLY)
Abs Immature Granulocytes: 0.03 10*3/uL (ref 0.00–0.07)
Basophils Absolute: 0.1 10*3/uL (ref 0.0–0.1)
Basophils Relative: 1 %
Eosinophils Absolute: 0.1 10*3/uL (ref 0.0–0.5)
Eosinophils Relative: 1 %
HCT: 43.8 % (ref 39.0–52.0)
Hemoglobin: 14 g/dL (ref 13.0–17.0)
Immature Granulocytes: 0 %
Lymphocytes Relative: 14 %
Lymphs Abs: 1.5 10*3/uL (ref 0.7–4.0)
MCH: 26.8 pg (ref 26.0–34.0)
MCHC: 32 g/dL (ref 30.0–36.0)
MCV: 83.9 fL (ref 80.0–100.0)
MONOS PCT: 8 %
Monocytes Absolute: 0.8 10*3/uL (ref 0.1–1.0)
Neutro Abs: 8.3 10*3/uL — ABNORMAL HIGH (ref 1.7–7.7)
Neutrophils Relative %: 76 %
Platelet Count: 327 10*3/uL (ref 150–400)
RBC: 5.22 MIL/uL (ref 4.22–5.81)
RDW: 18.3 % — ABNORMAL HIGH (ref 11.5–15.5)
WBC Count: 10.8 10*3/uL — ABNORMAL HIGH (ref 4.0–10.5)
nRBC: 0 % (ref 0.0–0.2)

## 2018-08-20 LAB — CMP (CANCER CENTER ONLY)
ALT: 13 U/L (ref 0–44)
AST: 14 U/L — ABNORMAL LOW (ref 15–41)
Albumin: 3.8 g/dL (ref 3.5–5.0)
Alkaline Phosphatase: 62 U/L (ref 38–126)
Anion gap: 7 (ref 5–15)
BILIRUBIN TOTAL: 0.4 mg/dL (ref 0.3–1.2)
BUN: 18 mg/dL (ref 8–23)
CO2: 27 mmol/L (ref 22–32)
CREATININE: 1.02 mg/dL (ref 0.61–1.24)
Calcium: 9.2 mg/dL (ref 8.9–10.3)
Chloride: 103 mmol/L (ref 98–111)
GFR, Est AFR Am: >60 mL/min (ref >60)
GFR, Estimated: >60 mL/min (ref >60)
Glucose, Bld: 98 mg/dL (ref 70–99)
Potassium: 4.3 mmol/L (ref 3.5–5.1)
Sodium: 137 mmol/L (ref 135–145)
Total Protein: 6.7 g/dL (ref 6.5–8.1)

## 2018-08-20 NOTE — Progress Notes (Addendum)
  [facility] OFFICE PROGRESS NOTE   Diagnosis: Gastroesophageal cancer  INTERVAL HISTORY:   [name] [name] returns as scheduled.  He overall feels well.  He has mild dysphagia, periodic burping.  No odynophagia.  No nausea or vomiting.  No bowel or bladder complaints.  No baseline neuropathy symptoms.  Objective:  Vital signs in last 24 hours:  Blood pressure 109/72, pulse 63, temperature 98.8 F (37.1 C), temperature source Oral, resp. rate 17, height 5\' 11"  (1.803 m), weight 166 lb 12.8 oz (75.7 kg), SpO2 100 %.    HEENT: No thrush or ulcers. Resp: Lungs clear bilaterally. Cardio: Regular rate and rhythm. GI: Abdomen soft and nontender.  No hepatomegaly. Vascular: No leg edema.    Lab Results:  Lab Results  Component Value Date   WBC 10.8 (H) 08/20/2018   HGB 14.0 08/20/2018   HCT 43.8 08/20/2018   MCV 83.9 08/20/2018   PLT 327 08/20/2018   NEUTROABS 8.3 (H) 08/20/2018    Imaging:  No results found.  Medications: I have reviewed the patient's current medications.  Assessment/Plan: 1. 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 Upper endoscopy 07/25/2018-esophageal mass at 38-41 cm extending to the GE junction and gastric cardia, biopsy confirmed adenocarcinoma ? CTs 08/01/2018- wall thickening at the distal esophagus extending to the GE junction and gastric cardia, gastrohepatic ligament lymphadenopathy, perigastric lymph node, too small to characterize liver lesion, bilateral adrenal adenomas ? PET scan 08/09/2018-hypermetabolic mass spanning the gastroesophageal junction.  Clustered gastrohepatic ligament lymph nodes likely involved with maximum SUV 3.2. ? Radiation beginning 08/21/2018 ? Cycle 1 weekly Taxol/carboplatin 08/21/2018  2. Iron deficiency anemia secondary to #1, improved 3. Colon polyps, tubular adenomas, identified on colonoscopy 07/25/2018 4. Hypertension 5. Hyperlipidemia 6. Tobacco use  Disposition: [name] [name]  appears stable.  He is scheduled to begin radiation tomorrow.  He is scheduled for cycle 1 weekly Taxol/carboplatin tomorrow as well.  We again reviewed potential toxicities with the chemotherapy.  He understands the rationale for the dexamethasone premedication.  He has Compazine at home for as needed use.  We reviewed the labs from today.  Counts are adequate for treatment.  He will return for lab, follow-up and cycle 2 weekly Taxol/carboplatin in 1 week.  He will contact the office in the interim with any problems.  Patient seen with Dr. Benay Spice.  PET scan images reviewed with [name] [name] and his wife.    Ned Card ANP/GNP-BC   08/20/2018  10:03 AM  This was a shared visit with Ned Card.  [name] [name] will begin chemotherapy and radiation tomorrow.  We reviewed the staging PET images with him.  There is no evidence of distant metastatic disease.  He will follow-up with Dr. Servando Snare at the completion of neoadjuvant therapy.  Julieanne Manson, MD

## 2018-08-20 NOTE — Progress Notes (Signed)
68 year old male diagnosed with cancer of the GE junction in the distal third of the esophagus and cardia. He will receive concurrent chemoradiation therapy. He is a patient of Dr. Benay Spice.  Past medical history includes tobacco, hypertension, hyperlipidemia, and anemia.  Medications include Decadron, ferrous sulfate, and Compazine.  Labs were reviewed.  Height: 5 feet 11 inches. Weight: 166.8 pounds. Usual body weight: 179 pounds in November 2019. BMI: 23.66.  Patient will receive concurrent chemoradiation therapy and a possible surgical resection. Patient reports he generally eats 2 meals a day.  Food in the morning consists of sandwiches, hotdogs, bratwurst, sausage.  The evening meal generally is [place] food or [place] food.  Patient reports he enjoys chocolate milk. Patient denies weight loss and states all scales have weighed differently. He currently is eating normally for him.  Nutrition diagnosis:  Predicted suboptimal energy intake related to new diagnosis of esophageal cancer and associated treatments as evidenced by history or presence of a condition for which research shows an increased incidence of suboptimal energy intake.  Intervention: I educated patient on the importance of eating smaller amounts of food more often. Encourage high-calorie high-protein foods to ensure weight maintenance. Reviewed oral nutrition supplements and provided samples and coupons. Recommended 1-2 oral nutrition supplements daily between meals. Reviewed soft moist protein foods.  Provided fact sheets on high-calorie, high-protein foods and soft moist foods. Questions were answered.  Teach back method was used.  Contact information given.  Monitoring, evaluation, goals: Patient will tolerate adequate calories and protein to minimize weight loss.  Next visit: Tuesday, March 10 during infusion.  **Disclaimer: This note was dictated with voice recognition software. Similar sounding words can  inadvertently be transcribed and this note may contain transcription errors which may not have been corrected upon publication of note.**

## 2018-08-21 ENCOUNTER — Inpatient Hospital Stay: Payer: Medicare Other

## 2018-08-21 ENCOUNTER — Ambulatory Visit
Admission: RE | Admit: 2018-08-21 | Discharge: 2018-08-21 | Disposition: A | Discharge status: Home / Self Care | Payer: Medicare Other | Source: Ambulatory Visit | Attending: Radiation Oncology | Admitting: Radiation Oncology

## 2018-08-21 VITALS — BP 106/79 | HR 92 | Temp 98.1°F | Resp 18

## 2018-08-21 DIAGNOSIS — C160 Malignant neoplasm of cardia: Secondary | ICD-10-CM | POA: Diagnosis not present

## 2018-08-21 DIAGNOSIS — Z5111 Encounter for antineoplastic chemotherapy: Secondary | ICD-10-CM | POA: Diagnosis not present

## 2018-08-21 DIAGNOSIS — Z510 Encounter for antineoplastic radiation therapy: Secondary | ICD-10-CM | POA: Insufficient documentation

## 2018-08-21 MED ORDER — SODIUM CHLORIDE 0.9 % IV SOLN
201.4000 mg | Freq: Once | INTRAVENOUS | Status: AC
  Administered 2018-08-21: 200 mg via INTRAVENOUS
  Filled 2018-08-21: qty 20

## 2018-08-21 MED ORDER — SODIUM CHLORIDE 0.9 % IV SOLN
Freq: Once | INTRAVENOUS | Status: AC
  Administered 2018-08-21: 09:00:00 via INTRAVENOUS
  Filled 2018-08-21: qty 250

## 2018-08-21 MED ORDER — SODIUM CHLORIDE 0.9 % IV SOLN
20.0000 mg | Freq: Once | INTRAVENOUS | Status: AC
  Administered 2018-08-21: 20 mg via INTRAVENOUS
  Filled 2018-08-21: qty 20

## 2018-08-21 MED ORDER — PALONOSETRON HCL INJECTION 0.25 MG/5ML
0.2500 mg | Freq: Once | INTRAVENOUS | Status: AC
  Administered 2018-08-21: 0.25 mg via INTRAVENOUS

## 2018-08-21 MED ORDER — SODIUM CHLORIDE 0.9 % IV SOLN
50.0000 mg/m2 | Freq: Once | INTRAVENOUS | Status: AC
  Administered 2018-08-21: 102 mg via INTRAVENOUS
  Filled 2018-08-21: qty 17

## 2018-08-21 MED ORDER — PALONOSETRON HCL INJECTION 0.25 MG/5ML
INTRAVENOUS | Status: AC
  Filled 2018-08-21: qty 5

## 2018-08-21 MED ORDER — FAMOTIDINE IN NACL 20-0.9 MG/50ML-% IV SOLN: 20.0000 mg | Freq: Once | INTRAVENOUS | Status: DC

## 2018-08-21 MED ORDER — DIPHENHYDRAMINE HCL 50 MG/ML IJ SOLN
INTRAMUSCULAR | Status: AC
  Filled 2018-08-21: qty 1

## 2018-08-21 MED ORDER — DIPHENHYDRAMINE HCL 50 MG/ML IJ SOLN
25.0000 mg | Freq: Once | INTRAMUSCULAR | Status: AC
  Administered 2018-08-21: 25 mg via INTRAVENOUS

## 2018-08-21 MED ORDER — SODIUM CHLORIDE 0.9 % IV SOLN
20.0000 mg | Freq: Once | INTRAVENOUS | Status: AC
  Administered 2018-08-21: 20 mg via INTRAVENOUS
  Filled 2018-08-21: qty 2

## 2018-08-21 NOTE — Patient Instructions (Signed)
[facility] Discharge Instructions for Patients Receiving Chemotherapy  Today you received the following chemotherapy agents Taxol and Carboplatin  To help prevent nausea and vomiting after your treatment, we encourage you to take your nausea medication as prescribed by MD. **DO NOT TAKE ZOFRAN FOR 3 DAYS AFTER CHEMOTHERAPY. TAKE COMPAZINE IF HAVING ANY NAUSEA**   If you develop nausea and vomiting that is not controlled by your nausea medication, call the clinic.   BELOW ARE SYMPTOMS THAT SHOULD BE REPORTED IMMEDIATELY:  *FEVER GREATER THAN 100.5 F  *CHILLS WITH OR WITHOUT FEVER  NAUSEA AND VOMITING THAT IS NOT CONTROLLED WITH YOUR NAUSEA MEDICATION  *UNUSUAL SHORTNESS OF BREATH  *UNUSUAL BRUISING OR BLEEDING  TENDERNESS IN MOUTH AND THROAT WITH OR WITHOUT PRESENCE OF ULCERS  *URINARY PROBLEMS  *BOWEL PROBLEMS  UNUSUAL RASH Items with * indicate a potential emergency and should be followed up as soon as possible.  Feel free to call the clinic should you have any questions or concerns. The clinic phone number is [phone].  Please show the [facility] at check-in to the Emergency Department and triage nurse.   Carboplatin injection What is this medicine? CARBOPLATIN (KAR boe pla tin) is a chemotherapy drug. It targets fast dividing cells, like cancer cells, and causes these cells to die. This medicine is used to treat ovarian cancer and many other cancers. This medicine may be used for other purposes; ask your health care provider or pharmacist if you have questions. COMMON BRAND NAME(S): Paraplatin What should I tell my health care provider before I take this medicine? They need to know if you have any of these conditions: -blood disorders -hearing problems -kidney disease -recent or ongoing radiation therapy -an unusual or allergic reaction to carboplatin, cisplatin, other chemotherapy, other medicines, foods, dyes, or preservatives -pregnant  or trying to get pregnant -breast-feeding How should I use this medicine? This drug is usually given as an infusion into a vein. It is administered in a hospital or clinic by a specially trained health care professional. Talk to your pediatrician regarding the use of this medicine in children. Special care may be needed. Overdosage: If you think you have taken too much of this medicine contact a poison control center or emergency room at once. NOTE: This medicine is only for you. Do not share this medicine with others. What if I miss a dose? It is important not to miss a dose. Call your doctor or health care professional if you are unable to keep an appointment. What may interact with this medicine? -medicines for seizures -medicines to increase blood counts like filgrastim, pegfilgrastim, sargramostim -some antibiotics like amikacin, gentamicin, neomycin, streptomycin, tobramycin -vaccines Talk to your doctor or health care professional before taking any of these medicines: -acetaminophen -aspirin -ibuprofen -ketoprofen -naproxen This list may not describe all possible interactions. Give your health care provider a list of all the medicines, herbs, non-prescription drugs, or dietary supplements you use. Also tell them if you smoke, drink alcohol, or use illegal drugs. Some items may interact with your medicine. What should I watch for while using this medicine? Your condition will be monitored carefully while you are receiving this medicine. You will need important blood work done while you are taking this medicine. This drug may make you feel generally unwell. This is not uncommon, as chemotherapy can affect healthy cells as well as cancer cells. Report any side effects. Continue your course of treatment even though you feel ill unless your doctor  tells you to stop. In some cases, you may be given additional medicines to help with side effects. Follow all directions for their use. Call  your doctor or health care professional for advice if you get a fever, chills or sore throat, or other symptoms of a cold or flu. Do not treat yourself. This drug decreases your body's ability to fight infections. Try to avoid being around people who are sick. This medicine may increase your risk to bruise or bleed. Call your doctor or health care professional if you notice any unusual bleeding. Be careful brushing and flossing your teeth or using a toothpick because you may get an infection or bleed more easily. If you have any dental work done, tell your dentist you are receiving this medicine. Avoid taking products that contain aspirin, acetaminophen, ibuprofen, naproxen, or ketoprofen unless instructed by your doctor. These medicines may hide a fever. Do not become pregnant while taking this medicine. Women should inform their doctor if they wish to become pregnant or think they might be pregnant. There is a potential for serious side effects to an unborn child. Talk to your health care professional or pharmacist for more information. Do not breast-feed an infant while taking this medicine. What side effects may I notice from receiving this medicine? Side effects that you should report to your doctor or health care professional as soon as possible: -allergic reactions like skin rash, itching or hives, swelling of the face, lips, or tongue -signs of infection - fever or chills, cough, sore throat, pain or difficulty passing urine -signs of decreased platelets or bleeding - bruising, pinpoint red spots on the skin, black, tarry stools, nosebleeds -signs of decreased red blood cells - unusually weak or tired, fainting spells, lightheadedness -breathing problems -changes in hearing -changes in vision -chest pain -high blood pressure -low blood counts - This drug may decrease the number of white blood cells, red blood cells and platelets. You may be at increased risk for infections and  bleeding. -nausea and vomiting -pain, swelling, redness or irritation at the injection site -pain, tingling, numbness in the hands or feet -problems with balance, talking, walking -trouble passing urine or change in the amount of urine Side effects that usually do not require medical attention (report to your doctor or health care professional if they continue or are bothersome): -hair loss -loss of appetite -metallic taste in the mouth or changes in taste This list may not describe all possible side effects. Call your doctor for medical advice about side effects. You may report side effects to FDA at 1-800-FDA-1088. Where should I keep my medicine? This drug is given in a hospital or clinic and will not be stored at home. NOTE: This sheet is a summary. It may not cover all possible information. If you have questions about this medicine, talk to your doctor, pharmacist, or health care provider.  2019 Elsevier/Gold Standard (2007-09-10 14:38:05)   Paclitaxel injection What is this medicine? PACLITAXEL (PAK li TAX el) is a chemotherapy drug. It targets fast dividing cells, like cancer cells, and causes these cells to die. This medicine is used to treat ovarian cancer, breast cancer, lung cancer, Kaposi's sarcoma, and other cancers. This medicine may be used for other purposes; ask your health care provider or pharmacist if you have questions. COMMON BRAND NAME(S): Onxol, Taxol What should I tell my health care provider before I take this medicine? They need to know if you have any of these conditions: -history of irregular heartbeat -liver  disease -low blood counts, like low white cell, platelet, or red cell counts -lung or breathing disease, like asthma -tingling of the fingers or toes, or other nerve disorder -an unusual or allergic reaction to paclitaxel, alcohol, polyoxyethylated castor oil, other chemotherapy, other medicines, foods, dyes, or preservatives -pregnant or trying to get  pregnant -breast-feeding How should I use this medicine? This drug is given as an infusion into a vein. It is administered in a hospital or clinic by a specially trained health care professional. Talk to your pediatrician regarding the use of this medicine in children. Special care may be needed. Overdosage: If you think you have taken too much of this medicine contact a poison control center or emergency room at once. NOTE: This medicine is only for you. Do not share this medicine with others. What if I miss a dose? It is important not to miss your dose. Call your doctor or health care professional if you are unable to keep an appointment. What may interact with this medicine? Do not take this medicine with any of the following medications: -disulfiram -metronidazole This medicine may also interact with the following medications: -antiviral medicines for hepatitis, HIV or AIDS -certain antibiotics like erythromycin and clarithromycin -certain medicines for fungal infections like ketoconazole and itraconazole -certain medicines for seizures like carbamazepine, phenobarbital, phenytoin -gemfibrozil -nefazodone -rifampin -St. John's wort This list may not describe all possible interactions. Give your health care provider a list of all the medicines, herbs, non-prescription drugs, or dietary supplements you use. Also tell them if you smoke, drink alcohol, or use illegal drugs. Some items may interact with your medicine. What should I watch for while using this medicine? Your condition will be monitored carefully while you are receiving this medicine. You will need important blood work done while you are taking this medicine. This medicine can cause serious allergic reactions. To reduce your risk you will need to take other medicine(s) before treatment with this medicine. If you experience allergic reactions like skin rash, itching or hives, swelling of the face, lips, or tongue, tell your  doctor or health care professional right away. In some cases, you may be given additional medicines to help with side effects. Follow all directions for their use. This drug may make you feel generally unwell. This is not uncommon, as chemotherapy can affect healthy cells as well as cancer cells. Report any side effects. Continue your course of treatment even though you feel ill unless your doctor tells you to stop. Call your doctor or health care professional for advice if you get a fever, chills or sore throat, or other symptoms of a cold or flu. Do not treat yourself. This drug decreases your body's ability to fight infections. Try to avoid being around people who are sick. This medicine may increase your risk to bruise or bleed. Call your doctor or health care professional if you notice any unusual bleeding. Be careful brushing and flossing your teeth or using a toothpick because you may get an infection or bleed more easily. If you have any dental work done, tell your dentist you are receiving this medicine. Avoid taking products that contain aspirin, acetaminophen, ibuprofen, naproxen, or ketoprofen unless instructed by your doctor. These medicines may hide a fever. Do not become pregnant while taking this medicine. Women should inform their doctor if they wish to become pregnant or think they might be pregnant. There is a potential for serious side effects to an unborn child. Talk to your health care professional  or pharmacist for more information. Do not breast-feed an infant while taking this medicine. Men are advised not to father a child while receiving this medicine. This product may contain alcohol. Ask your pharmacist or healthcare provider if this medicine contains alcohol. Be sure to tell all healthcare providers you are taking this medicine. Certain medicines, like metronidazole and disulfiram, can cause an unpleasant reaction when taken with alcohol. The reaction includes flushing,  headache, nausea, vomiting, sweating, and increased thirst. The reaction can last from 30 minutes to several hours. What side effects may I notice from receiving this medicine? Side effects that you should report to your doctor or health care professional as soon as possible: -allergic reactions like skin rash, itching or hives, swelling of the face, lips, or tongue -breathing problems -changes in vision -fast, irregular heartbeat -high or low blood pressure -mouth sores -pain, tingling, numbness in the hands or feet -signs of decreased platelets or bleeding - bruising, pinpoint red spots on the skin, black, tarry stools, blood in the urine -signs of decreased red blood cells - unusually weak or tired, feeling faint or lightheaded, falls -signs of infection - fever or chills, cough, sore throat, pain or difficulty passing urine -signs and symptoms of liver injury like dark yellow or brown urine; general ill feeling or flu-like symptoms; light-colored stools; loss of appetite; nausea; right upper belly pain; unusually weak or tired; yellowing of the eyes or skin -swelling of the ankles, feet, hands -unusually slow heartbeat Side effects that usually do not require medical attention (report to your doctor or health care professional if they continue or are bothersome): -diarrhea -hair loss -loss of appetite -muscle or joint pain -nausea, vomiting -pain, redness, or irritation at site where injected -tiredness This list may not describe all possible side effects. Call your doctor for medical advice about side effects. You may report side effects to FDA at 1-800-FDA-1088. Where should I keep my medicine? This drug is given in a hospital or clinic and will not be stored at home. NOTE: This sheet is a summary. It may not cover all possible information. If you have questions about this medicine, talk to your doctor, pharmacist, or health care provider.  2019 Elsevier/Gold Standard (2017-02-06  13:14:55)

## 2018-08-22 ENCOUNTER — Ambulatory Visit
Admission: RE | Admit: 2018-08-22 | Discharge: 2018-08-22 | Disposition: A | Discharge status: Home / Self Care | Payer: Medicare Other | Source: Ambulatory Visit | Attending: Radiation Oncology | Admitting: Radiation Oncology

## 2018-08-22 DIAGNOSIS — Z510 Encounter for antineoplastic radiation therapy: Secondary | ICD-10-CM | POA: Diagnosis not present

## 2018-08-23 ENCOUNTER — Ambulatory Visit
Admission: RE | Admit: 2018-08-23 | Discharge: 2018-08-23 | Disposition: A | Discharge status: Home / Self Care | Payer: Medicare Other | Source: Ambulatory Visit | Attending: Radiation Oncology | Admitting: Radiation Oncology

## 2018-08-23 DIAGNOSIS — Z510 Encounter for antineoplastic radiation therapy: Secondary | ICD-10-CM | POA: Diagnosis not present

## 2018-08-23 DIAGNOSIS — C160 Malignant neoplasm of cardia: Secondary | ICD-10-CM

## 2018-08-23 MED ORDER — SONAFINE EX EMUL
1.0000 "application " | Freq: Once | CUTANEOUS | Status: AC
  Administered 2018-08-23: 1 via TOPICAL

## 2018-08-23 NOTE — Progress Notes (Signed)
Pt here for patient teaching.  Pt given Radiation and You booklet and Sonafine.  Reviewed areas of pertinence such as fatigue, hair loss, skin changes and throat changes . Pt able to give teach back of to pat skin, use unscented/gentle soap and drink plenty of water,apply Sonafine bid and avoid applying anything to skin within 4 hours of treatment. Pt verbalizes understanding of information given and will contact nursing with any questions or concerns.

## 2018-08-25 ENCOUNTER — Other Ambulatory Visit: Payer: Self-pay | Admitting: Oncology

## 2018-08-26 ENCOUNTER — Ambulatory Visit
Admission: RE | Admit: 2018-08-26 | Discharge: 2018-08-26 | Disposition: A | Discharge status: Home / Self Care | Payer: Medicare Other | Source: Ambulatory Visit | Attending: Radiation Oncology | Admitting: Radiation Oncology

## 2018-08-26 DIAGNOSIS — Z510 Encounter for antineoplastic radiation therapy: Secondary | ICD-10-CM | POA: Diagnosis not present

## 2018-08-26 NOTE — Progress Notes (Signed)
  Radiation Oncology         [phone] ________________________________  Name: Murlin Schrieber MRN: [id number]  Date: 08/09/2018  DOB: 02-07-1951  SIMULATION AND TREATMENT PLANNING NOTE  DIAGNOSIS:     ICD-10-CM   1. 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  Identity was confirmed.  All relevant records and images related to the planned course of therapy were reviewed.   Written consent to proceed with treatment was confirmed which was freely given after reviewing the details related to the planned course of therapy had been reviewed with the patient.  Then, the patient was set-up in a stable reproducible supine position for radiation therapy.  CT images were obtained.  Surface markings were placed.    Medically necessary complex treatment device(s) for immobilization:  vac-lock bag.   The CT images were loaded into the planning software.  Then the target and avoidance structures were contoured.  Treatment planning then occurred.  The radiation prescription was entered and confirmed. I have requested : Intensity Modulated Radiotherapy (IMRT) is medically necessary for this case for the following reason:  sparing of nearby critical normal structures including the spinal cord, heart, and lungs.   The patient will undergo daily image guidance to ensure accurate localization of the target, and adequate minimize dose to the normal surrounding structures in close proximity to the target.   PLAN:  The patient will receive 45 Gy in 25 fractions initially. A boost will then be given to 5.4 Gy to yield 50.4 Gy to the high dose target, utilizing a sequential boost technique.   Special treatment procedure The patient will also receive concurrent chemotherapy during the treatment. The patient may therefore experience increased toxicity or side effects and the patient will be monitored for  such problems. This may require extra lab work as necessary. This therefore constitutes a special treatment procedure.   ________________________________   Jodelle Gross, MD, PhD

## 2018-08-27 ENCOUNTER — Inpatient Hospital Stay: Payer: Medicare Other | Admitting: Nurse Practitioner

## 2018-08-27 ENCOUNTER — Ambulatory Visit
Admission: RE | Admit: 2018-08-27 | Discharge: 2018-08-27 | Disposition: A | Discharge status: Home / Self Care | Payer: Medicare Other | Source: Ambulatory Visit | Attending: Radiation Oncology | Admitting: Radiation Oncology

## 2018-08-27 ENCOUNTER — Inpatient Hospital Stay: Payer: Medicare Other

## 2018-08-27 ENCOUNTER — Other Ambulatory Visit: Payer: Medicare Other

## 2018-08-27 ENCOUNTER — Encounter: Payer: Self-pay | Admitting: Nurse Practitioner

## 2018-08-27 ENCOUNTER — Telehealth: Payer: Self-pay | Admitting: Nurse Practitioner

## 2018-08-27 VITALS — BP 101/69 | HR 63 | Temp 98.2°F | Resp 18 | Ht 71.0 in | Wt 167.4 lb

## 2018-08-27 DIAGNOSIS — C160 Malignant neoplasm of cardia: Secondary | ICD-10-CM

## 2018-08-27 DIAGNOSIS — Z720 Tobacco use: Secondary | ICD-10-CM | POA: Diagnosis not present

## 2018-08-27 DIAGNOSIS — I10 Essential (primary) hypertension: Secondary | ICD-10-CM | POA: Diagnosis not present

## 2018-08-27 DIAGNOSIS — D509 Iron deficiency anemia, unspecified: Secondary | ICD-10-CM

## 2018-08-27 DIAGNOSIS — Z510 Encounter for antineoplastic radiation therapy: Secondary | ICD-10-CM | POA: Diagnosis not present

## 2018-08-27 DIAGNOSIS — Z5111 Encounter for antineoplastic chemotherapy: Secondary | ICD-10-CM | POA: Diagnosis not present

## 2018-08-27 LAB — CMP (CANCER CENTER ONLY)
ALT: 12 U/L (ref 0–44)
AST: 11 U/L — ABNORMAL LOW (ref 15–41)
Albumin: 3.6 g/dL (ref 3.5–5.0)
Alkaline Phosphatase: 52 U/L (ref 38–126)
Anion gap: 8 (ref 5–15)
BUN: 19 mg/dL (ref 8–23)
CO2: 27 mmol/L (ref 22–32)
Calcium: 9.2 mg/dL (ref 8.9–10.3)
Chloride: 105 mmol/L (ref 98–111)
Creatinine: 0.84 mg/dL (ref 0.61–1.24)
GFR, Est AFR Am: >60 mL/min (ref >60)
GFR, Estimated: >60 mL/min (ref >60)
Glucose, Bld: 92 mg/dL (ref 70–99)
POTASSIUM: 4.1 mmol/L (ref 3.5–5.1)
Sodium: 140 mmol/L (ref 135–145)
Total Bilirubin: 0.4 mg/dL (ref 0.3–1.2)
Total Protein: 6.4 g/dL — ABNORMAL LOW (ref 6.5–8.1)

## 2018-08-27 LAB — CBC WITH DIFFERENTIAL (CANCER CENTER ONLY)
Abs Immature Granulocytes: 0.12 10*3/uL — ABNORMAL HIGH (ref 0.00–0.07)
Basophils Absolute: 0 10*3/uL (ref 0.0–0.1)
Basophils Relative: 1 %
Eosinophils Absolute: 0.1 10*3/uL (ref 0.0–0.5)
Eosinophils Relative: 2 %
HEMATOCRIT: 42.2 % (ref 39.0–52.0)
Hemoglobin: 13.3 g/dL (ref 13.0–17.0)
Immature Granulocytes: 2 %
Lymphocytes Relative: 12 %
Lymphs Abs: 0.8 10*3/uL (ref 0.7–4.0)
MCH: 27 pg (ref 26.0–34.0)
MCHC: 31.5 g/dL (ref 30.0–36.0)
MCV: 85.6 fL (ref 80.0–100.0)
MONO ABS: 0.4 10*3/uL (ref 0.1–1.0)
Monocytes Relative: 6 %
NEUTROS ABS: 5.1 10*3/uL (ref 1.7–7.7)
Neutrophils Relative %: 77 %
Platelet Count: 292 10*3/uL (ref 150–400)
RBC: 4.93 MIL/uL (ref 4.22–5.81)
RDW: 18.7 % — ABNORMAL HIGH (ref 11.5–15.5)
WBC Count: 6.6 10*3/uL (ref 4.0–10.5)
nRBC: 0 % (ref 0.0–0.2)

## 2018-08-27 MED ORDER — DEXAMETHASONE SODIUM PHOSPHATE 10 MG/ML IJ SOLN
INTRAMUSCULAR | Status: AC
  Filled 2018-08-27: qty 1

## 2018-08-27 MED ORDER — DEXAMETHASONE SODIUM PHOSPHATE 10 MG/ML IJ SOLN
10.0000 mg | Freq: Once | INTRAMUSCULAR | Status: AC
  Administered 2018-08-27: 10 mg via INTRAVENOUS

## 2018-08-27 MED ORDER — FAMOTIDINE IN NACL 20-0.9 MG/50ML-% IV SOLN: 20.0000 mg | Freq: Once | INTRAVENOUS | Status: DC

## 2018-08-27 MED ORDER — PALONOSETRON HCL INJECTION 0.25 MG/5ML
0.2500 mg | Freq: Once | INTRAVENOUS | Status: AC
  Administered 2018-08-27: 0.25 mg via INTRAVENOUS

## 2018-08-27 MED ORDER — SODIUM CHLORIDE 0.9 % IV SOLN
204.4000 mg | Freq: Once | INTRAVENOUS | Status: AC
  Administered 2018-08-27: 200 mg via INTRAVENOUS
  Filled 2018-08-27: qty 20

## 2018-08-27 MED ORDER — PALONOSETRON HCL INJECTION 0.25 MG/5ML
INTRAVENOUS | Status: AC
  Filled 2018-08-27: qty 5

## 2018-08-27 MED ORDER — SODIUM CHLORIDE 0.9 % IV SOLN
50.0000 mg/m2 | Freq: Once | INTRAVENOUS | Status: AC
  Administered 2018-08-27: 102 mg via INTRAVENOUS
  Filled 2018-08-27: qty 17

## 2018-08-27 MED ORDER — SODIUM CHLORIDE 0.9 % IV SOLN
Freq: Once | INTRAVENOUS | Status: AC
  Administered 2018-08-27: 12:00:00 via INTRAVENOUS
  Filled 2018-08-27: qty 250

## 2018-08-27 MED ORDER — SODIUM CHLORIDE 0.9 % IV SOLN
20.0000 mg | Freq: Once | INTRAVENOUS | Status: AC
  Administered 2018-08-27: 20 mg via INTRAVENOUS
  Filled 2018-08-27: qty 2

## 2018-08-27 MED ORDER — DIPHENHYDRAMINE HCL 50 MG/ML IJ SOLN
25.0000 mg | Freq: Once | INTRAMUSCULAR | Status: AC
  Administered 2018-08-27: 25 mg via INTRAVENOUS

## 2018-08-27 MED ORDER — DIPHENHYDRAMINE HCL 50 MG/ML IJ SOLN
INTRAMUSCULAR | Status: AC
  Filled 2018-08-27: qty 1

## 2018-08-27 NOTE — Progress Notes (Signed)
  [facility] OFFICE PROGRESS NOTE   Diagnosis: [name] [name] esophageal cancer  INTERVAL HISTORY:   [name] [name] returns as scheduled.  He completed cycle 1 weekly Taxol/carboplatin 08/21/2018.  He had no signs of allergic reaction.  Specifically no shortness of breath, chest pain, rash, throat tightness.  No nausea or vomiting.  No mouth sores.  No diarrhea.  Mild constipation.  He plans on beginning a laxative today.  He continues to have dysphagia with solids and liquids.  Objective:  Vital signs in last 24 hours:  Blood pressure 101/69, pulse 63, temperature 98.2 F (36.8 C), temperature source Oral, resp. rate 18, height 5\' 11"  (1.803 m), weight 167 lb 6.4 oz (75.9 kg), SpO2 97 %.    HEENT: No thrush or ulcers. Resp: Distant breath sounds.  No respiratory distress. Cardio: Regular rate and rhythm. GI: Abdomen soft and nontender.  No hepatomegaly. Vascular: No leg edema.  Skin: No rash.   Lab Results:  Lab Results  Component Value Date   WBC 6.6 08/27/2018   HGB 13.3 08/27/2018   HCT 42.2 08/27/2018   MCV 85.6 08/27/2018   PLT 292 08/27/2018   NEUTROABS 5.1 08/27/2018    Imaging:  No results found.  Medications: I have reviewed the patient's current medications.  Assessment/Plan: 1. 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 Upper endoscopy 07/25/2018-esophageal mass at 38-41 cm extending to the GE junction and gastric cardia, biopsy confirmed adenocarcinoma ? CTs 08/01/2018- wall thickening at the distal esophagus extending to the GE junction and gastric cardia, gastrohepatic ligament lymphadenopathy, perigastric lymph node, too small to characterize liver lesion, bilateral adrenal adenomas ? PET scan 08/09/2018-hypermetabolic mass spanning the gastroesophageal junction.  Clustered gastrohepatic ligament lymph nodes likely involved with maximum SUV 3.2. ? Radiation beginning 08/21/2018 ? Cycle 1 weekly Taxol/carboplatin 08/21/2018 ? Cycle 2  weekly Taxol/carboplatin 08/27/2018  2. Iron deficiency anemia secondary to #1, improved 3. Colon polyps, tubular adenomas, identified on colonoscopy 07/25/2018 4. Hypertension 5. Hyperlipidemia 6. Tobacco use  Disposition: [name] [name] appears stable.  He has completed 1 cycle of weekly Taxol/carboplatin.  He tolerated well.  Plan to proceed with cycle 2 weekly Taxol/carboplatin today as scheduled.  We reviewed the CBC from today.  Counts are adequate for treatment.  He has lost about 10 pounds over the past few months.  Last several blood pressure readings have been low.  He will discontinue his blood pressure medication and monitor blood pressure at home.  He will return for cycle 3 weekly Taxol/carboplatin in 1 week.  We will see him in follow-up in 2 weeks.  He will contact the office in the interim with any problems.  Plan reviewed with Dr. Benay Spice.    Ned Card ANP/GNP-BC   08/27/2018  10:56 AM

## 2018-08-27 NOTE — Telephone Encounter (Signed)
Gave avs and calendar ° °

## 2018-08-27 NOTE — Progress Notes (Signed)
Nutrition Follow-up:  Patient with cancer of GE junction in the distal third of the esophagus and cardia.  Patient receiving concurrent chemotherapy and radiation therapy.  Patient followed by Dr. Learta Codding  Met with patient and wife during infusion.  Patient reports eating less only 1 hot dog in the am for breakfast instead of 2.  Reports having to chew foods well.  Is not currently limiting foods or types of foods (ie meats, breads).  Reports ate chicken pie last night for dinner.  Had bacon eggs and english muffin for breakfast yesterday am.  Reports that he is drinking 1 boost daily.  Does not really like the taste of them but able to get them down.  Has been eating more ice cream, typically a bowl before going to bed.    Reports slight burning, irritation in mid esophagus yesterday after radiation.    Medications: reviewed  Labs: reviewed  Anthropometrics:   Weight 167 lb 6.4 oz today stable from weight of 166 lb 8 oz on 3/3.    UBW of 179 lb   NUTRITION DIAGNOSIS: Predicted suboptimal energy intake continues    INTERVENTION:  Encouraged eating q 2 hours of high calorie, high protein food/shake Encouraged 350 calorie shake or higher 2-3 times per day. Discussed options to flavor shakes (ie adding ice cream, fruit, peanut butter, etc) Wife asking about protein powder and making on smoothies/shakes.  Discussed options and provided recipe from [facility] to wife for smoothie.      MONITORING, EVALUATION, GOAL: Patient will tolerate adequate calories and protein to minimize weight loss   NEXT VISIT: Tuesday, March 17 during infusion  Ysabel Stankovich B. Zenia Resides, [place], [place] Registered Dietitian [phone] (pager)

## 2018-08-27 NOTE — Patient Instructions (Signed)
[facility] Discharge Instructions for Patients Receiving Chemotherapy  Today you received the following chemotherapy agents: Taxol and Carboplatin.  To help prevent nausea and vomiting after your treatment, we encourage you to take your nausea medication as prescribed by MD. **DO NOT TAKE ZOFRAN FOR 3 DAYS AFTER CHEMOTHERAPY. TAKE COMPAZINE IF HAVING ANY NAUSEA**   If you develop nausea and vomiting that is not controlled by your nausea medication, call the clinic.   BELOW ARE SYMPTOMS THAT SHOULD BE REPORTED IMMEDIATELY:  *FEVER GREATER THAN 100.5 F  *CHILLS WITH OR WITHOUT FEVER  NAUSEA AND VOMITING THAT IS NOT CONTROLLED WITH YOUR NAUSEA MEDICATION  *UNUSUAL SHORTNESS OF BREATH  *UNUSUAL BRUISING OR BLEEDING  TENDERNESS IN MOUTH AND THROAT WITH OR WITHOUT PRESENCE OF ULCERS  *URINARY PROBLEMS  *BOWEL PROBLEMS  UNUSUAL RASH Items with * indicate a potential emergency and should be followed up as soon as possible.  Feel free to call the clinic should you have any questions or concerns. The clinic phone number is [phone].  Please show the [facility] at check-in to the Emergency Department and triage nurse.

## 2018-08-28 ENCOUNTER — Ambulatory Visit
Admission: RE | Admit: 2018-08-28 | Discharge: 2018-08-28 | Disposition: A | Discharge status: Home / Self Care | Payer: Medicare Other | Source: Ambulatory Visit | Attending: Radiation Oncology | Admitting: Radiation Oncology

## 2018-08-28 DIAGNOSIS — Z510 Encounter for antineoplastic radiation therapy: Secondary | ICD-10-CM | POA: Diagnosis not present

## 2018-08-29 ENCOUNTER — Ambulatory Visit
Admission: RE | Admit: 2018-08-29 | Discharge: 2018-08-29 | Disposition: A | Discharge status: Home / Self Care | Payer: Medicare Other | Source: Ambulatory Visit | Attending: Radiation Oncology | Admitting: Radiation Oncology

## 2018-08-29 DIAGNOSIS — Z510 Encounter for antineoplastic radiation therapy: Secondary | ICD-10-CM | POA: Diagnosis not present

## 2018-08-30 ENCOUNTER — Other Ambulatory Visit: Payer: Self-pay

## 2018-08-30 ENCOUNTER — Ambulatory Visit
Admission: RE | Admit: 2018-08-30 | Discharge: 2018-08-30 | Disposition: A | Discharge status: Home / Self Care | Payer: Medicare Other | Source: Ambulatory Visit | Attending: Radiation Oncology | Admitting: Radiation Oncology

## 2018-08-30 DIAGNOSIS — Z510 Encounter for antineoplastic radiation therapy: Secondary | ICD-10-CM | POA: Diagnosis not present

## 2018-09-01 ENCOUNTER — Other Ambulatory Visit: Payer: Self-pay | Admitting: Oncology

## 2018-09-02 ENCOUNTER — Other Ambulatory Visit: Payer: Self-pay

## 2018-09-02 ENCOUNTER — Ambulatory Visit
Admission: RE | Admit: 2018-09-02 | Discharge: 2018-09-02 | Disposition: A | Discharge status: Home / Self Care | Payer: Medicare Other | Source: Ambulatory Visit | Attending: Radiation Oncology | Admitting: Radiation Oncology

## 2018-09-02 DIAGNOSIS — Z510 Encounter for antineoplastic radiation therapy: Secondary | ICD-10-CM | POA: Diagnosis not present

## 2018-09-03 ENCOUNTER — Other Ambulatory Visit: Payer: Self-pay | Admitting: Medical

## 2018-09-03 ENCOUNTER — Other Ambulatory Visit: Payer: Self-pay

## 2018-09-03 ENCOUNTER — Inpatient Hospital Stay: Payer: Medicare Other | Admitting: Nutrition

## 2018-09-03 ENCOUNTER — Inpatient Hospital Stay (HOSPITAL_BASED_OUTPATIENT_CLINIC_OR_DEPARTMENT_OTHER): Payer: Medicare Other | Admitting: Medical

## 2018-09-03 ENCOUNTER — Inpatient Hospital Stay: Payer: Medicare Other

## 2018-09-03 ENCOUNTER — Ambulatory Visit
Admission: RE | Admit: 2018-09-03 | Discharge: 2018-09-03 | Disposition: A | Discharge status: Home / Self Care | Payer: Medicare Other | Source: Ambulatory Visit | Attending: Radiation Oncology | Admitting: Radiation Oncology

## 2018-09-03 VITALS — BP 162/78 | HR 78 | Temp 98.9°F | Resp 18

## 2018-09-03 DIAGNOSIS — C160 Malignant neoplasm of cardia: Secondary | ICD-10-CM

## 2018-09-03 DIAGNOSIS — Z5111 Encounter for antineoplastic chemotherapy: Secondary | ICD-10-CM | POA: Diagnosis not present

## 2018-09-03 DIAGNOSIS — R112 Nausea with vomiting, unspecified: Secondary | ICD-10-CM | POA: Diagnosis not present

## 2018-09-03 DIAGNOSIS — K219 Gastro-esophageal reflux disease without esophagitis: Secondary | ICD-10-CM

## 2018-09-03 DIAGNOSIS — Z510 Encounter for antineoplastic radiation therapy: Secondary | ICD-10-CM | POA: Diagnosis not present

## 2018-09-03 LAB — CBC WITH DIFFERENTIAL (CANCER CENTER ONLY)
Abs Immature Granulocytes: 0.03 10*3/uL (ref 0.00–0.07)
Basophils Absolute: 0 10*3/uL (ref 0.0–0.1)
Basophils Relative: 1 %
Eosinophils Absolute: 0.1 10*3/uL (ref 0.0–0.5)
Eosinophils Relative: 3 %
HCT: 41.9 % (ref 39.0–52.0)
HEMOGLOBIN: 13.4 g/dL (ref 13.0–17.0)
Immature Granulocytes: 1 %
Lymphocytes Relative: 9 %
Lymphs Abs: 0.4 10*3/uL — ABNORMAL LOW (ref 0.7–4.0)
MCH: 27.2 pg (ref 26.0–34.0)
MCHC: 32 g/dL (ref 30.0–36.0)
MCV: 85.2 fL (ref 80.0–100.0)
Monocytes Absolute: 0.5 10*3/uL (ref 0.1–1.0)
Monocytes Relative: 10 %
Neutro Abs: 3.6 10*3/uL (ref 1.7–7.7)
Neutrophils Relative %: 76 %
Platelet Count: 247 10*3/uL (ref 150–400)
RBC: 4.92 MIL/uL (ref 4.22–5.81)
RDW: 19.1 % — ABNORMAL HIGH (ref 11.5–15.5)
WBC Count: 4.7 10*3/uL (ref 4.0–10.5)
nRBC: 0 % (ref 0.0–0.2)

## 2018-09-03 MED ORDER — PALONOSETRON HCL INJECTION 0.25 MG/5ML
0.2500 mg | Freq: Once | INTRAVENOUS | Status: AC
  Administered 2018-09-03: 0.25 mg via INTRAVENOUS

## 2018-09-03 MED ORDER — LIDOCAINE VISCOUS HCL 2 % MT SOLN
15.0000 mL | Freq: Once | OROMUCOSAL | Status: AC
  Administered 2018-09-03: 15 mL via ORAL
  Filled 2018-09-03: qty 15

## 2018-09-03 MED ORDER — LORAZEPAM 1 MG PO TABS
ORAL_TABLET | ORAL | Status: AC
  Filled 2018-09-03: qty 1

## 2018-09-03 MED ORDER — LORAZEPAM 1 MG PO TABS
0.5000 mg | ORAL_TABLET | Freq: Once | ORAL | Status: AC
  Administered 2018-09-03: 0.5 mg via ORAL

## 2018-09-03 MED ORDER — ALUM & MAG HYDROXIDE-SIMETH 200-200-20 MG/5ML PO SUSP
30.0000 mL | Freq: Once | ORAL | Status: AC
  Administered 2018-09-03: 30 mL via ORAL

## 2018-09-03 MED ORDER — DIPHENHYDRAMINE HCL 50 MG/ML IJ SOLN
25.0000 mg | Freq: Once | INTRAMUSCULAR | Status: AC
  Administered 2018-09-03: 25 mg via INTRAVENOUS

## 2018-09-03 MED ORDER — DIPHENHYDRAMINE HCL 50 MG/ML IJ SOLN
INTRAMUSCULAR | Status: AC
  Filled 2018-09-03: qty 1

## 2018-09-03 MED ORDER — DEXAMETHASONE SODIUM PHOSPHATE 10 MG/ML IJ SOLN
INTRAMUSCULAR | Status: AC
  Filled 2018-09-03: qty 1

## 2018-09-03 MED ORDER — SODIUM CHLORIDE 0.9 % IV SOLN
20.0000 mg | Freq: Once | INTRAVENOUS | Status: AC
  Administered 2018-09-03: 20 mg via INTRAVENOUS
  Filled 2018-09-03: qty 2

## 2018-09-03 MED ORDER — SODIUM CHLORIDE 0.9 % IV SOLN
Freq: Once | INTRAVENOUS | Status: AC
  Administered 2018-09-03: 12:00:00 via INTRAVENOUS
  Filled 2018-09-03: qty 250

## 2018-09-03 MED ORDER — ALUM & MAG HYDROXIDE-SIMETH 200-200-20 MG/5ML PO SUSP
ORAL | Status: AC
  Filled 2018-09-03: qty 30

## 2018-09-03 MED ORDER — SODIUM CHLORIDE 0.9 % IV SOLN
50.0000 mg/m2 | Freq: Once | INTRAVENOUS | Status: AC
  Administered 2018-09-03: 102 mg via INTRAVENOUS
  Filled 2018-09-03: qty 17

## 2018-09-03 MED ORDER — DEXAMETHASONE SODIUM PHOSPHATE 10 MG/ML IJ SOLN
10.0000 mg | Freq: Once | INTRAMUSCULAR | Status: AC
  Administered 2018-09-03: 10 mg via INTRAVENOUS

## 2018-09-03 MED ORDER — FAMOTIDINE IN NACL 20-0.9 MG/50ML-% IV SOLN: 20.0000 mg | Freq: Once | INTRAVENOUS | Status: DC

## 2018-09-03 MED ORDER — LIDOCAINE VISCOUS HCL 2 % MT SOLN
15.0000 mL | Freq: Once | OROMUCOSAL | Status: AC
  Filled 2018-09-03: qty 15

## 2018-09-03 MED ORDER — SODIUM CHLORIDE 0.9 % IV SOLN
204.4000 mg | Freq: Once | INTRAVENOUS | Status: AC
  Administered 2018-09-03: 200 mg via INTRAVENOUS
  Filled 2018-09-03: qty 20

## 2018-09-03 MED ORDER — PALONOSETRON HCL INJECTION 0.25 MG/5ML
INTRAVENOUS | Status: AC
  Filled 2018-09-03: qty 5

## 2018-09-03 MED ORDER — ALUM & MAG HYDROXIDE-SIMETH 200-200-20 MG/5ML PO SUSP: 30.0000 mL | Freq: Once | ORAL | Status: AC

## 2018-09-03 NOTE — Progress Notes (Signed)
Per Dr. Benay Spice, ok to treat with CMP from 08/27/18.

## 2018-09-03 NOTE — Patient Instructions (Signed)
   [facility] Discharge Instructions for Patients Receiving Chemotherapy  Today you received the following chemotherapy agents Taxol and Carboplatin   To help prevent nausea and vomiting after your treatment, we encourage you to take your nausea medication as directed.    If you develop nausea and vomiting that is not controlled by your nausea medication, call the clinic.   BELOW ARE SYMPTOMS THAT SHOULD BE REPORTED IMMEDIATELY:  *FEVER GREATER THAN 100.5 F  *CHILLS WITH OR WITHOUT FEVER  NAUSEA AND VOMITING THAT IS NOT CONTROLLED WITH YOUR NAUSEA MEDICATION  *UNUSUAL SHORTNESS OF BREATH  *UNUSUAL BRUISING OR BLEEDING  TENDERNESS IN MOUTH AND THROAT WITH OR WITHOUT PRESENCE OF ULCERS  *URINARY PROBLEMS  *BOWEL PROBLEMS  UNUSUAL RASH Items with * indicate a potential emergency and should be followed up as soon as possible.  Feel free to call the clinic should you have any questions or concerns. The clinic phone number is [phone].  Please show the CHEMO ALERT CARD at check-in to the Emergency Department and triage nurse.   

## 2018-09-03 NOTE — Progress Notes (Signed)
Nutrition follow-up completed with patient and wife during infusion for cancer of the GE junction and the distal third of the esophagus and cardia. Weight is stable overall. Patient reports swallowing has gotten a little bit harder.  States it takes him a long time to eat. Reports no changes in consistency or texture of foods and liquids. He is not really using oral nutrition supplements at this time.  He prefers to wait until he really needs them.  Nutrition diagnosis: Predicted suboptimal energy intake continues.  Intervention: Reinforced the importance of adequate calories and protein for weight maintenance. Educated patient on strategies for easier swallowing. Questions were answered.  Teach back method used.  Monitoring, evaluation, goals: Patient will tolerate adequate calories and protein to minimize weight loss.  Next visit: Tuesday, March 24 during infusion.  **Disclaimer: This note was dictated with voice recognition software. Similar sounding words can inadvertently be transcribed and this note may contain transcription errors which may not have been corrected upon publication of note.**

## 2018-09-04 ENCOUNTER — Other Ambulatory Visit: Payer: Self-pay | Admitting: Radiation Oncology

## 2018-09-04 ENCOUNTER — Other Ambulatory Visit: Payer: Self-pay

## 2018-09-04 ENCOUNTER — Ambulatory Visit
Admission: RE | Admit: 2018-09-04 | Discharge: 2018-09-04 | Disposition: A | Discharge status: Home / Self Care | Payer: Medicare Other | Source: Ambulatory Visit | Attending: Radiation Oncology | Admitting: Radiation Oncology

## 2018-09-04 DIAGNOSIS — C160 Malignant neoplasm of cardia: Secondary | ICD-10-CM

## 2018-09-04 DIAGNOSIS — Z510 Encounter for antineoplastic radiation therapy: Secondary | ICD-10-CM | POA: Diagnosis not present

## 2018-09-04 MED ORDER — LIDOCAINE VISCOUS HCL 2 % MT SOLN: OROMUCOSAL | 0 refills | Status: DC

## 2018-09-04 MED ORDER — SUCRALFATE 1 G PO TABS: ORAL_TABLET | ORAL | 0 refills | Status: DC

## 2018-09-04 NOTE — Progress Notes (Signed)
1340: Pt c/o burning, and nausea in chest. Taxol paused and VS obtained (see flowsheets and eMAR). Sandi Mealy, PA to bedside. Medications administered (see eMAR) and Taxol resumed per Sandi Mealy, PA. Will continue to monitor

## 2018-09-04 NOTE — Progress Notes (Signed)
    DATE:  09/03/2018                                          X CHEMO/IMMUNOTHERAPY REACTION              MD:  Dr. Dominica Severin B. Sherrill   AGENT/BLOOD PRODUCT RECEIVING TODAY:              Carboplatin and paclitaxel   AGENT/BLOOD PRODUCT RECEIVING IMMEDIATELY PRIOR TO REACTION:           Paclitaxel   VS: BP:      162/78   P:        75        SPO2:        99% on room air     T: 98.9              REACTION(S):            Nausea and GERD   PREMEDS:      Aloxi, Benadryl, dexamethasone, and Pepcid   INTERVENTION: Ativan 0.5 mg sublingual x1 and a GI cocktail   Review of Systems  Review of Systems  Constitutional: Negative for appetite change, chills, diaphoresis and fever.  Respiratory: Negative for cough, choking, shortness of breath and wheezing.   Cardiovascular: Negative for chest pain and palpitations.  Gastrointestinal: Positive for nausea. Negative for constipation, diarrhea and vomiting.       GERD  Genitourinary: Negative for decreased urine volume.  Neurological: Negative for headaches.     Physical Exam  Physical Exam Constitutional:      General: He is not in acute distress.    Appearance: He is not diaphoretic.  HENT:     Head: Normocephalic and atraumatic.  Cardiovascular:     Rate and Rhythm: Normal rate and regular rhythm.     Heart sounds: Normal heart sounds. No murmur. No friction rub. No gallop.   Pulmonary:     Effort: Pulmonary effort is normal. No respiratory distress.     Breath sounds: Normal breath sounds. No wheezing or rales.  Skin:    General: Skin is warm and dry.  Neurological:     Mental Status: He is alert.     OUTCOME:                 The patient was given Ativan 0.5 mg sublingual and a GI cocktail.  His symptoms resolved.  His Taxol was not paused.  He was instructed to begin over-the-counter Prilosec 20 mg once daily.  He was able to complete his infusion with out any further issues of concern.   Sandi Mealy, MHS, PA-C

## 2018-09-05 ENCOUNTER — Other Ambulatory Visit: Payer: Self-pay

## 2018-09-05 ENCOUNTER — Ambulatory Visit
Admission: RE | Admit: 2018-09-05 | Discharge: 2018-09-05 | Disposition: A | Discharge status: Home / Self Care | Payer: Medicare Other | Source: Ambulatory Visit | Attending: Radiation Oncology | Admitting: Radiation Oncology

## 2018-09-05 DIAGNOSIS — Z510 Encounter for antineoplastic radiation therapy: Secondary | ICD-10-CM | POA: Diagnosis not present

## 2018-09-06 ENCOUNTER — Ambulatory Visit
Admission: RE | Admit: 2018-09-06 | Discharge: 2018-09-06 | Disposition: A | Discharge status: Home / Self Care | Payer: Medicare Other | Source: Ambulatory Visit | Attending: Radiation Oncology | Admitting: Radiation Oncology

## 2018-09-06 ENCOUNTER — Other Ambulatory Visit: Payer: Self-pay

## 2018-09-06 DIAGNOSIS — Z510 Encounter for antineoplastic radiation therapy: Secondary | ICD-10-CM | POA: Diagnosis not present

## 2018-09-08 ENCOUNTER — Other Ambulatory Visit: Payer: Self-pay | Admitting: Oncology

## 2018-09-09 ENCOUNTER — Ambulatory Visit
Admission: RE | Admit: 2018-09-09 | Discharge: 2018-09-09 | Disposition: A | Discharge status: Home / Self Care | Payer: Medicare Other | Source: Ambulatory Visit | Attending: Radiation Oncology | Admitting: Radiation Oncology

## 2018-09-09 ENCOUNTER — Other Ambulatory Visit: Payer: Self-pay

## 2018-09-09 DIAGNOSIS — Z510 Encounter for antineoplastic radiation therapy: Secondary | ICD-10-CM | POA: Diagnosis not present

## 2018-09-10 ENCOUNTER — Other Ambulatory Visit: Payer: Self-pay

## 2018-09-10 ENCOUNTER — Inpatient Hospital Stay: Payer: Medicare Other

## 2018-09-10 ENCOUNTER — Inpatient Hospital Stay: Payer: Medicare Other | Admitting: Nutrition

## 2018-09-10 ENCOUNTER — Ambulatory Visit
Admission: RE | Admit: 2018-09-10 | Discharge: 2018-09-10 | Disposition: A | Discharge status: Home / Self Care | Payer: Medicare Other | Source: Ambulatory Visit | Attending: Radiation Oncology | Admitting: Radiation Oncology

## 2018-09-10 ENCOUNTER — Inpatient Hospital Stay (HOSPITAL_BASED_OUTPATIENT_CLINIC_OR_DEPARTMENT_OTHER): Payer: Medicare Other | Admitting: Oncology

## 2018-09-10 VITALS — BP 132/80 | HR 66 | Temp 97.7°F | Resp 18 | Ht 71.0 in | Wt 168.0 lb

## 2018-09-10 DIAGNOSIS — C160 Malignant neoplasm of cardia: Secondary | ICD-10-CM | POA: Diagnosis not present

## 2018-09-10 DIAGNOSIS — R1310 Dysphagia, unspecified: Secondary | ICD-10-CM

## 2018-09-10 DIAGNOSIS — Z5111 Encounter for antineoplastic chemotherapy: Secondary | ICD-10-CM | POA: Diagnosis not present

## 2018-09-10 DIAGNOSIS — D509 Iron deficiency anemia, unspecified: Secondary | ICD-10-CM | POA: Diagnosis not present

## 2018-09-10 DIAGNOSIS — Z720 Tobacco use: Secondary | ICD-10-CM

## 2018-09-10 DIAGNOSIS — I10 Essential (primary) hypertension: Secondary | ICD-10-CM | POA: Diagnosis not present

## 2018-09-10 DIAGNOSIS — Z510 Encounter for antineoplastic radiation therapy: Secondary | ICD-10-CM | POA: Diagnosis not present

## 2018-09-10 LAB — CMP (CANCER CENTER ONLY)
ALK PHOS: 48 U/L (ref 38–126)
ALT: 15 U/L (ref 0–44)
AST: 13 U/L — ABNORMAL LOW (ref 15–41)
Albumin: 3.7 g/dL (ref 3.5–5.0)
Anion gap: 10 (ref 5–15)
BUN: 11 mg/dL (ref 8–23)
CO2: 23 mmol/L (ref 22–32)
Calcium: 9.1 mg/dL (ref 8.9–10.3)
Chloride: 106 mmol/L (ref 98–111)
Creatinine: 0.8 mg/dL (ref 0.61–1.24)
GFR, Est AFR Am: >60 mL/min (ref >60)
GFR, Estimated: >60 mL/min (ref >60)
Glucose, Bld: 93 mg/dL (ref 70–99)
Potassium: 4.1 mmol/L (ref 3.5–5.1)
Sodium: 139 mmol/L (ref 135–145)
Total Bilirubin: 0.3 mg/dL (ref 0.3–1.2)
Total Protein: 6.6 g/dL (ref 6.5–8.1)

## 2018-09-10 LAB — CBC WITH DIFFERENTIAL (CANCER CENTER ONLY)
Abs Immature Granulocytes: 0.04 10*3/uL (ref 0.00–0.07)
Basophils Absolute: 0 10*3/uL (ref 0.0–0.1)
Basophils Relative: 1 %
EOS PCT: 3 %
Eosinophils Absolute: 0.2 10*3/uL (ref 0.0–0.5)
HCT: 42.8 % (ref 39.0–52.0)
Hemoglobin: 13.7 g/dL (ref 13.0–17.0)
Immature Granulocytes: 1 %
Lymphocytes Relative: 6 %
Lymphs Abs: 0.3 10*3/uL — ABNORMAL LOW (ref 0.7–4.0)
MCH: 27.9 pg (ref 26.0–34.0)
MCHC: 32 g/dL (ref 30.0–36.0)
MCV: 87.2 fL (ref 80.0–100.0)
Monocytes Absolute: 0.5 10*3/uL (ref 0.1–1.0)
Monocytes Relative: 10 %
Neutro Abs: 3.5 10*3/uL (ref 1.7–7.7)
Neutrophils Relative %: 79 %
Platelet Count: 240 10*3/uL (ref 150–400)
RBC: 4.91 MIL/uL (ref 4.22–5.81)
RDW: 19.9 % — AB (ref 11.5–15.5)
WBC Count: 4.4 10*3/uL (ref 4.0–10.5)
nRBC: 0 % (ref 0.0–0.2)

## 2018-09-10 MED ORDER — DIPHENHYDRAMINE HCL 50 MG/ML IJ SOLN
25.0000 mg | Freq: Once | INTRAMUSCULAR | Status: AC
  Administered 2018-09-10: 25 mg via INTRAVENOUS

## 2018-09-10 MED ORDER — SODIUM CHLORIDE 0.9 % IV SOLN
50.0000 mg/m2 | Freq: Once | INTRAVENOUS | Status: AC
  Administered 2018-09-10: 102 mg via INTRAVENOUS
  Filled 2018-09-10: qty 17

## 2018-09-10 MED ORDER — DEXAMETHASONE SODIUM PHOSPHATE 10 MG/ML IJ SOLN
10.0000 mg | Freq: Once | INTRAMUSCULAR | Status: AC
  Administered 2018-09-10: 10 mg via INTRAVENOUS

## 2018-09-10 MED ORDER — PALONOSETRON HCL INJECTION 0.25 MG/5ML
0.2500 mg | Freq: Once | INTRAVENOUS | Status: AC
  Administered 2018-09-10: 0.25 mg via INTRAVENOUS

## 2018-09-10 MED ORDER — PALONOSETRON HCL INJECTION 0.25 MG/5ML
INTRAVENOUS | Status: AC
  Filled 2018-09-10: qty 5

## 2018-09-10 MED ORDER — SODIUM CHLORIDE 0.9 % IV SOLN
204.4000 mg | Freq: Once | INTRAVENOUS | Status: AC
  Administered 2018-09-10: 200 mg via INTRAVENOUS
  Filled 2018-09-10: qty 20

## 2018-09-10 MED ORDER — DIPHENHYDRAMINE HCL 50 MG/ML IJ SOLN
INTRAMUSCULAR | Status: AC
  Filled 2018-09-10: qty 1

## 2018-09-10 MED ORDER — FAMOTIDINE IN NACL 20-0.9 MG/50ML-% IV SOLN: 20.0000 mg | Freq: Once | INTRAVENOUS | Status: DC

## 2018-09-10 MED ORDER — SODIUM CHLORIDE 0.9 % IV SOLN
Freq: Once | INTRAVENOUS | Status: AC
  Administered 2018-09-10: 12:00:00 via INTRAVENOUS
  Filled 2018-09-10: qty 250

## 2018-09-10 MED ORDER — SODIUM CHLORIDE 0.9 % IV SOLN
20.0000 mg | Freq: Once | INTRAVENOUS | Status: AC
  Administered 2018-09-10: 20 mg via INTRAVENOUS
  Filled 2018-09-10: qty 2

## 2018-09-10 MED ORDER — DEXAMETHASONE SODIUM PHOSPHATE 10 MG/ML IJ SOLN
INTRAMUSCULAR | Status: AC
  Filled 2018-09-10: qty 1

## 2018-09-10 NOTE — Progress Notes (Signed)
  [facility] OFFICE PROGRESS NOTE   Diagnosis: Gastroesophageal cancer  INTERVAL HISTORY:   Mr. Pompei continues daily radiation and weekly Taxol/carboplatin.  He completed week 3 of Taxol/carboplatin on 09/03/2018.  No symptom of allergic reaction.  No nausea or neuropathy symptoms.  He reports mild dysphagia.  No odynophagia.  He is tolerating a regular diet when eating slowly.  Objective:  Vital signs in last 24 hours:  Blood pressure 132/80, pulse 66, temperature 97.7 F (36.5 C), temperature source Oral, resp. rate 18, height 5\' 11"  (1.803 m), weight 168 lb (76.2 kg), SpO2 99 %.    Physical examination: Not performed today    Lab Results:  Lab Results  Component Value Date   WBC 4.4 09/10/2018   HGB 13.7 09/10/2018   HCT 42.8 09/10/2018   MCV 87.2 09/10/2018   PLT 240 09/10/2018   NEUTROABS 3.5 09/10/2018    CMP  Lab Results  Component Value Date   NA 139 09/10/2018   K 4.1 09/10/2018   CL 106 09/10/2018   CO2 23 09/10/2018   GLUCOSE 93 09/10/2018   BUN 11 09/10/2018   CREATININE 0.80 09/10/2018   CALCIUM 9.1 09/10/2018   PROT 6.6 09/10/2018   ALBUMIN 3.7 09/10/2018   AST 13 (L) 09/10/2018   ALT 15 09/10/2018   ALKPHOS 48 09/10/2018   BILITOT 0.3 09/10/2018   GFRNONAA >60 09/10/2018   GFRAA >60 09/10/2018    No results found for: CEA1  Lab Results  Component Value Date   INR 1.03 07/05/2018    Imaging:  No results found.  Medications: I have reviewed the patient's current medications.   Assessment/Plan: 1. 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 Upper endoscopy 07/25/2018-esophageal mass at 38-41 cm extending to the GE junction and gastric cardia, biopsy confirmed adenocarcinoma ? CTs 08/01/2018- wall thickening at the distal esophagus extending to the GE junction and gastric cardia, gastrohepatic ligament lymphadenopathy, perigastric lymph node, too small to characterize liver lesion, bilateral adrenal  adenomas ? PET scan 08/09/2018-hypermetabolic mass spanning the gastroesophageal junction.  Clustered gastrohepatic ligament lymph nodes likely involved with maximum SUV 3.2. ? Radiation beginning 08/21/2018 ? Cycle 1 weekly Taxol/carboplatin 08/21/2018 ? Cycle 2 weekly Taxol/carboplatin 08/27/2018 ? Cycle 3 weekly Taxol/carboplatin 09/03/2018 ? Cycle 4 weekly Taxol/carboplatin 09/10/2018  2. Iron deficiency anemia secondary to #1, improved 3. Colon polyps, tubular adenomas, identified on colonoscopy 07/25/2018 4. Hypertension 5. Hyperlipidemia 6. Tobacco use   Disposition: Mr. Corrales appears appears stable.  He is tolerating the chemotherapy and radiation well.  He will complete week for Taxol/carboplatin today.  He will return for week 5 of chemotherapy on 09/17/2018.  He is scheduled to see Dr. Servando Snare next week.  I will defer a restaging x-ray evaluation to Dr. Servando Snare.  Mr. Xiang will return for an office visit in approximately 4 weeks.  He will contact us in the interim for new symptoms.  Betsy Coder, MD  09/10/2018  10:59 AM

## 2018-09-10 NOTE — Patient Instructions (Signed)
   [facility] Discharge Instructions for Patients Receiving Chemotherapy  Today you received the following chemotherapy agents Taxol and Carboplatin   To help prevent nausea and vomiting after your treatment, we encourage you to take your nausea medication as directed.    If you develop nausea and vomiting that is not controlled by your nausea medication, call the clinic.   BELOW ARE SYMPTOMS THAT SHOULD BE REPORTED IMMEDIATELY:  *FEVER GREATER THAN 100.5 F  *CHILLS WITH OR WITHOUT FEVER  NAUSEA AND VOMITING THAT IS NOT CONTROLLED WITH YOUR NAUSEA MEDICATION  *UNUSUAL SHORTNESS OF BREATH  *UNUSUAL BRUISING OR BLEEDING  TENDERNESS IN MOUTH AND THROAT WITH OR WITHOUT PRESENCE OF ULCERS  *URINARY PROBLEMS  *BOWEL PROBLEMS  UNUSUAL RASH Items with * indicate a potential emergency and should be followed up as soon as possible.  Feel free to call the clinic should you have any questions or concerns. The clinic phone number is [phone].  Please show the CHEMO ALERT CARD at check-in to the Emergency Department and triage nurse.   

## 2018-09-10 NOTE — Progress Notes (Signed)
RD working remotely.  Nutrition follow up completed with patient being treated for cancer of the GE junction and distal third of the esophagus and cardia.  He reports he is consuming a regular diet but he must eat slowly.  He states he drinks oral nutrition supplements but even they are hard to swallow at times. He denies nausea, vomiting, constipation and diarrhea. Weight is stable at 168 pounds.  Nutrition Diagnosis: Predicted suboptimal energy intake continues.  Intervention: Provided support and encouragement for patient to continue adequate calories and protein for weight maintenance. Encouraged patient to contact me with questions.  Monitoring, Evaluation, Goals: Patient will tolerate adequate calories and protein for weight maintenance.  Next Visit:Tuesday, March 31.

## 2018-09-11 ENCOUNTER — Ambulatory Visit
Admission: RE | Admit: 2018-09-11 | Discharge: 2018-09-11 | Disposition: A | Discharge status: Home / Self Care | Payer: Medicare Other | Source: Ambulatory Visit | Attending: Radiation Oncology | Admitting: Radiation Oncology

## 2018-09-11 ENCOUNTER — Other Ambulatory Visit: Payer: Self-pay

## 2018-09-11 DIAGNOSIS — Z510 Encounter for antineoplastic radiation therapy: Secondary | ICD-10-CM | POA: Diagnosis not present

## 2018-09-12 ENCOUNTER — Telehealth: Payer: Self-pay | Admitting: Oncology

## 2018-09-12 ENCOUNTER — Ambulatory Visit
Admission: RE | Admit: 2018-09-12 | Discharge: 2018-09-12 | Disposition: A | Discharge status: Home / Self Care | Payer: Medicare Other | Source: Ambulatory Visit | Attending: Radiation Oncology | Admitting: Radiation Oncology

## 2018-09-12 ENCOUNTER — Other Ambulatory Visit: Payer: Self-pay

## 2018-09-12 DIAGNOSIS — Z510 Encounter for antineoplastic radiation therapy: Secondary | ICD-10-CM | POA: Diagnosis not present

## 2018-09-12 NOTE — Telephone Encounter (Signed)
Scheduled appointments per 03/24 los. Called and patient notified of upcoming appointments.

## 2018-09-13 ENCOUNTER — Other Ambulatory Visit: Payer: Self-pay

## 2018-09-13 ENCOUNTER — Ambulatory Visit
Admission: RE | Admit: 2018-09-13 | Discharge: 2018-09-13 | Disposition: A | Discharge status: Home / Self Care | Payer: Medicare Other | Source: Ambulatory Visit | Attending: Radiation Oncology | Admitting: Radiation Oncology

## 2018-09-13 DIAGNOSIS — Z510 Encounter for antineoplastic radiation therapy: Secondary | ICD-10-CM | POA: Diagnosis not present

## 2018-09-15 ENCOUNTER — Other Ambulatory Visit: Payer: Self-pay | Admitting: Oncology

## 2018-09-16 ENCOUNTER — Other Ambulatory Visit: Payer: Self-pay

## 2018-09-16 ENCOUNTER — Ambulatory Visit
Admission: RE | Admit: 2018-09-16 | Discharge: 2018-09-16 | Disposition: A | Discharge status: Home / Self Care | Payer: Medicare Other | Source: Ambulatory Visit | Attending: Radiation Oncology | Admitting: Radiation Oncology

## 2018-09-16 DIAGNOSIS — Z510 Encounter for antineoplastic radiation therapy: Secondary | ICD-10-CM | POA: Diagnosis not present

## 2018-09-17 ENCOUNTER — Inpatient Hospital Stay: Payer: Medicare Other

## 2018-09-17 ENCOUNTER — Inpatient Hospital Stay: Payer: Medicare Other | Admitting: Nutrition

## 2018-09-17 ENCOUNTER — Telehealth: Payer: Self-pay | Admitting: Nutrition

## 2018-09-17 ENCOUNTER — Ambulatory Visit
Admission: RE | Admit: 2018-09-17 | Discharge: 2018-09-17 | Disposition: A | Discharge status: Home / Self Care | Payer: Medicare Other | Source: Ambulatory Visit | Attending: Radiation Oncology | Admitting: Radiation Oncology

## 2018-09-17 ENCOUNTER — Other Ambulatory Visit: Payer: Self-pay

## 2018-09-17 VITALS — BP 143/91 | HR 57 | Temp 98.3°F | Resp 18

## 2018-09-17 DIAGNOSIS — Z5111 Encounter for antineoplastic chemotherapy: Secondary | ICD-10-CM | POA: Diagnosis not present

## 2018-09-17 DIAGNOSIS — C160 Malignant neoplasm of cardia: Secondary | ICD-10-CM

## 2018-09-17 DIAGNOSIS — Z510 Encounter for antineoplastic radiation therapy: Secondary | ICD-10-CM | POA: Diagnosis not present

## 2018-09-17 LAB — CMP (CANCER CENTER ONLY)
ALT: 16 U/L (ref 0–44)
AST: 16 U/L (ref 15–41)
Albumin: 3.5 g/dL (ref 3.5–5.0)
Alkaline Phosphatase: 46 U/L (ref 38–126)
Anion gap: 9 (ref 5–15)
BUN: 14 mg/dL (ref 8–23)
CO2: 22 mmol/L (ref 22–32)
Calcium: 9.1 mg/dL (ref 8.9–10.3)
Chloride: 109 mmol/L (ref 98–111)
Creatinine: 0.79 mg/dL (ref 0.61–1.24)
GFR, Est AFR Am: >60 mL/min (ref >60)
GFR, Estimated: >60 mL/min (ref >60)
Glucose, Bld: 101 mg/dL — ABNORMAL HIGH (ref 70–99)
Potassium: 4.3 mmol/L (ref 3.5–5.1)
Sodium: 140 mmol/L (ref 135–145)
Total Bilirubin: 0.3 mg/dL (ref 0.3–1.2)
Total Protein: 6.2 g/dL — ABNORMAL LOW (ref 6.5–8.1)

## 2018-09-17 LAB — CBC WITH DIFFERENTIAL (CANCER CENTER ONLY)
ABS IMMATURE GRANULOCYTES: 0.03 10*3/uL (ref 0.00–0.07)
Basophils Absolute: 0 10*3/uL (ref 0.0–0.1)
Basophils Relative: 1 %
Eosinophils Absolute: 0.1 10*3/uL (ref 0.0–0.5)
Eosinophils Relative: 3 %
HCT: 40.1 % (ref 39.0–52.0)
HEMOGLOBIN: 13 g/dL (ref 13.0–17.0)
Immature Granulocytes: 1 %
LYMPHS ABS: 0.3 10*3/uL — AB (ref 0.7–4.0)
LYMPHS PCT: 8 %
MCH: 28 pg (ref 26.0–34.0)
MCHC: 32.4 g/dL (ref 30.0–36.0)
MCV: 86.4 fL (ref 80.0–100.0)
Monocytes Absolute: 0.6 10*3/uL (ref 0.1–1.0)
Monocytes Relative: 13 %
Neutro Abs: 3.1 10*3/uL (ref 1.7–7.7)
Neutrophils Relative %: 74 %
Platelet Count: 188 10*3/uL (ref 150–400)
RBC: 4.64 MIL/uL (ref 4.22–5.81)
RDW: 19.9 % — ABNORMAL HIGH (ref 11.5–15.5)
WBC Count: 4.2 10*3/uL (ref 4.0–10.5)
nRBC: 0 % (ref 0.0–0.2)

## 2018-09-17 MED ORDER — SODIUM CHLORIDE 0.9 % IV SOLN
Freq: Once | INTRAVENOUS | Status: AC
  Administered 2018-09-17: 15:00:00 via INTRAVENOUS
  Filled 2018-09-17: qty 250

## 2018-09-17 MED ORDER — DIPHENHYDRAMINE HCL 50 MG/ML IJ SOLN
INTRAMUSCULAR | Status: AC
  Filled 2018-09-17: qty 1

## 2018-09-17 MED ORDER — FAMOTIDINE IN NACL 20-0.9 MG/50ML-% IV SOLN: 20.0000 mg | Freq: Once | INTRAVENOUS | Status: DC

## 2018-09-17 MED ORDER — DEXAMETHASONE SODIUM PHOSPHATE 10 MG/ML IJ SOLN
10.0000 mg | Freq: Once | INTRAMUSCULAR | Status: AC
  Administered 2018-09-17: 10 mg via INTRAVENOUS

## 2018-09-17 MED ORDER — DIPHENHYDRAMINE HCL 50 MG/ML IJ SOLN
25.0000 mg | Freq: Once | INTRAMUSCULAR | Status: AC
  Administered 2018-09-17: 25 mg via INTRAVENOUS

## 2018-09-17 MED ORDER — SODIUM CHLORIDE 0.9 % IV SOLN
204.4000 mg | Freq: Once | INTRAVENOUS | Status: AC
  Administered 2018-09-17: 200 mg via INTRAVENOUS
  Filled 2018-09-17: qty 20

## 2018-09-17 MED ORDER — PALONOSETRON HCL INJECTION 0.25 MG/5ML
0.2500 mg | Freq: Once | INTRAVENOUS | Status: AC
  Administered 2018-09-17: 0.25 mg via INTRAVENOUS

## 2018-09-17 MED ORDER — SODIUM CHLORIDE 0.9 % IV SOLN
20.0000 mg | Freq: Once | INTRAVENOUS | Status: AC
  Administered 2018-09-17: 20 mg via INTRAVENOUS
  Filled 2018-09-17: qty 2

## 2018-09-17 MED ORDER — DEXAMETHASONE SODIUM PHOSPHATE 10 MG/ML IJ SOLN
INTRAMUSCULAR | Status: AC
  Filled 2018-09-17: qty 1

## 2018-09-17 MED ORDER — SODIUM CHLORIDE 0.9 % IV SOLN
50.0000 mg/m2 | Freq: Once | INTRAVENOUS | Status: AC
  Administered 2018-09-17: 102 mg via INTRAVENOUS
  Filled 2018-09-17: qty 17

## 2018-09-17 MED ORDER — PALONOSETRON HCL INJECTION 0.25 MG/5ML
INTRAVENOUS | Status: AC
  Filled 2018-09-17: qty 5

## 2018-09-17 NOTE — Patient Instructions (Signed)
   [facility] Discharge Instructions for Patients Receiving Chemotherapy  Today you received the following chemotherapy agents Taxol and Carboplatin   To help prevent nausea and vomiting after your treatment, we encourage you to take your nausea medication as directed.    If you develop nausea and vomiting that is not controlled by your nausea medication, call the clinic.   BELOW ARE SYMPTOMS THAT SHOULD BE REPORTED IMMEDIATELY:  *FEVER GREATER THAN 100.5 F  *CHILLS WITH OR WITHOUT FEVER  NAUSEA AND VOMITING THAT IS NOT CONTROLLED WITH YOUR NAUSEA MEDICATION  *UNUSUAL SHORTNESS OF BREATH  *UNUSUAL BRUISING OR BLEEDING  TENDERNESS IN MOUTH AND THROAT WITH OR WITHOUT PRESENCE OF ULCERS  *URINARY PROBLEMS  *BOWEL PROBLEMS  UNUSUAL RASH Items with * indicate a potential emergency and should be followed up as soon as possible.  Feel free to call the clinic should you have any questions or concerns. The clinic phone number is [phone].  Please show the CHEMO ALERT CARD at check-in to the Emergency Department and triage nurse.   

## 2018-09-17 NOTE — Telephone Encounter (Signed)
Contacted patient to follow up with him regarding his nutrition status. He was unavailable to take call but left a message with my contact number for questions.

## 2018-09-18 ENCOUNTER — Other Ambulatory Visit: Payer: Self-pay

## 2018-09-18 ENCOUNTER — Ambulatory Visit
Admission: RE | Admit: 2018-09-18 | Discharge: 2018-09-18 | Disposition: A | Discharge status: Home / Self Care | Payer: Medicare Other | Source: Ambulatory Visit | Attending: Radiation Oncology | Admitting: Radiation Oncology

## 2018-09-18 DIAGNOSIS — C160 Malignant neoplasm of cardia: Secondary | ICD-10-CM | POA: Diagnosis not present

## 2018-09-18 DIAGNOSIS — Z510 Encounter for antineoplastic radiation therapy: Secondary | ICD-10-CM | POA: Insufficient documentation

## 2018-09-19 ENCOUNTER — Telehealth: Payer: Self-pay | Admitting: Cardiothoracic Surgery

## 2018-09-19 ENCOUNTER — Ambulatory Visit: Payer: Medicare Other | Admitting: Cardiothoracic Surgery

## 2018-09-19 ENCOUNTER — Other Ambulatory Visit: Payer: Self-pay

## 2018-09-19 ENCOUNTER — Ambulatory Visit
Admission: RE | Admit: 2018-09-19 | Discharge: 2018-09-19 | Disposition: A | Discharge status: Home / Self Care | Payer: Medicare Other | Source: Ambulatory Visit | Attending: Radiation Oncology | Admitting: Radiation Oncology

## 2018-09-19 DIAGNOSIS — Z510 Encounter for antineoplastic radiation therapy: Secondary | ICD-10-CM | POA: Diagnosis not present

## 2018-09-19 NOTE — Telephone Encounter (Addendum)
[address]       [place] [address]             [phone]                    [facility] [place] Medical Record #[id number] Date of Birth: December 10, 1950  Referring: No ref. provider found Primary Care: Binnie Rail, MD Primary Cardiologist: No primary care provider on file.  Chief Complaint:    No chief complaint on file.   History of Present Illness:    [name] [name] 68 y.o. 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  The patient's disease first came to medical attention when he presented to his primary care doctor with new anemia, repeat labs confirmed anemia.  Patient denies any known observed blood in his stool.  He denies any loss of weight, he has noted some very mild discomfort with swallowing, but not to the extent of interfering with his diet   Colonoscopy and upper GI endoscopy were performed.  Upper GI endoscopy showed  A large, submucosal and ulcerating mass with bleeding and stigmata of recent bleeding was found in the distal esophagus, at the gastroesophageal junction and in the cardia, 38 cm from the incisors.  CT and PET scans have been done. 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  He notes early in his career in the 1980s he did do some work at the power plant that could have exposed him to asbestos.  Patient has been a smoker for proximately half a pack a day for 50 years. He denies any previous history of reflux  Notes that he has never missed a day of work his whole career from illness.  ? Radiation beginning 08/21/2018 ? Cycle 1 weekly Taxol/carboplatin 08/21/2018 ? Cycle 2 weekly Taxol/carboplatin 08/27/2018 ? Cycle 3 weekly Taxol/carboplatin 09/03/2018 ? Cycle 4 weekly Taxol/carboplatin 09/10/2018  Cycle 5 weekly  Taxol/carboplatin 09/17/2018  Last dose of radiation scheduled for April 10   Patient has been tolerating therapy well so far, does have some burning sensation several times a day in his chest associated with eating and drinking. He has been able to maintain his weight relatively well between 160 to 166 pounds, taking a soft diet with soups and other soft foods has had no blood in his stool denies any fever or chills  Current Activity/ Functional Status:  Patient is independent with mobility/ambulation, transfers, ADL's, IADL's.   Zubrod Score: At the time of surgery this patients most appropriate activity status/level should be described as: [x]     0    Normal activity, no symptoms []     1    Restricted in physical strenuous activity but ambulatory, able to do out light work []     2    Ambulatory and capable of self care, unable to do work activities, up and about               >50 % of waking hours                              []     3    Only limited self care, in bed greater than  50% of waking hours []     4    Completely disabled, no self care, confined to bed or chair []     5    Moribund   Past Medical History:  Diagnosis Date   Adenocarcinoma of gastroesophageal junction ([place]) 07/29/2018   Anemia    Diverticulosis    Hyperlipidemia    Hypertension    Tobacco abuse     Past Surgical History:  Procedure Laterality Date   COLONOSCOPY     WISDOM TOOTH EXTRACTION      Family History  Problem Relation Age of Onset   Cancer Mother        bone cancer   Heart attack Father 44   Cancer Sister        uterine cancer   Heart attack Paternal Uncle        X2, both < 13   COPD Neg Hx    Diabetes Neg Hx    Stroke Neg Hx    Colon cancer Neg Hx    Patient's mother died in her 47s of "bone cancer she had one sister died of cancer of unknown cause his father died at age 29 grandfather died at age 84 and one brother died at age 67 all of sudden death presumed to be  a myocardial infarction  Social History   Tobacco Use  Smoking Status Current Every Day Smoker   Packs/day: 0.25   Years: 50.00   Pack years: 12.50   Types: Cigarettes  Smokeless Tobacco Never Used  Tobacco Comment   06/02/14 2-3 pp week    Social History   Substance and Sexual Activity  Alcohol Use Yes   Alcohol/week: 2.0 standard drinks   Types: 2 Glasses of wine per week   Comment:  occasionally, not daily     No Known Allergies  Current Outpatient Medications  Medication Sig Dispense Refill   ferrous sulfate 325 (65 FE) MG tablet Take 325 mg by mouth daily with breakfast.      lidocaine (XYLOCAINE) 2 % solution Patient: Mix 1part 2% viscous lidocaine, 1part H20. Swallow 44mL of diluted mixture, 36min before meals and at bedtime, up to QID for soreness 100 mL 0   lisinopril-hydrochlorothiazide (PRINZIDE,ZESTORETIC) 20-25 MG tablet Take 1 tablet by mouth daily. 90 tablet 3   prochlorperazine (COMPAZINE) 10 MG tablet TAKE 1 TABLET(10 MG) BY MOUTH EVERY 6 HOURS AS NEEDED 60 tablet 1   sucralfate (CARAFATE) 1 g tablet Dissolve 1 tablet in 10 mL H20 and swallow QID prn soreness in esophagus. 40 tablet 0   No current facility-administered medications for this visit.    Facility-Administered Medications Ordered in Other Visits  Medication Dose Route Frequency Provider Last Rate Last Dose   alum & mag hydroxide-simeth (MAALOX/MYLANTA) 200-200-20 MG/5ML suspension 30 mL  30 mL Oral Once Harle Stanford., PA-C       And   lidocaine (XYLOCAINE) 2 % viscous mouth solution 15 mL  15 mL Oral Once Harle Stanford., PA-C        Pertinent items are noted in HPI.   Review of Systems:     Cardiac Review of Systems: [Y] = yes  or   [ N ] = no   Chest Pain [ y]  Resting SOB [ N ] Exertional SOB  [N ]  Orthopnea [  ]   Pedal Edema [ N ]    Palpitations Aqua.Slicker ] Syncope  [ N]   Presyncope [ n  ]  General Review of Systems: [Y] = yes [  ]=no Constitional: recent weight change  [none  ];  Wt loss over the last 3 months [   ] anorexia [  ]; fatigue [  ]; nausea [  ]; night sweats [  ]; fever [  ]; or chills [  ];           Eye : blurred vision [  ]; diplopia [   ]; vision changes [  ];  Amaurosis fugax[  ]; Resp: cough [  ];  wheezing[  ];  hemoptysis[  ]; shortness of breath[  ]; paroxysmal nocturnal dyspnea[  ]; dyspnea on exertion[  ]; or orthopnea[  ];  GI:  gallstones[  ], vomiting[  ];  dysphagia[  ]; melena[  ];  hematochezia [  ]; heartburn[  ];   Hx of  Colonoscopy[  ]; GU: kidney stones [  ]; hematuria[  ];   dysuria [  ];  nocturia[  ];  history of     obstruction [  ]; urinary frequency [  ]             Skin: rash, swelling[  ];, hair loss[  ];  peripheral edema[  ];  or itching[  ]; Musculosketetal: myalgias[  ];  joint swelling[  ];  joint erythema[  ];  joint pain[  ];  back pain[  ];  Heme/Lymph: bruising[  ];  bleeding[  ];  anemia[  ];  Neuro: TIA[  ];  headaches[  ];  stroke[  ];  vertigo[  ];  seizures[  ];   paresthesias[  ];  difficulty walking[  ];  Psych:depression[  ]; anxiety[  ];  Endocrine: diabetes[  ];  thyroid dysfunction[  ];  Immunizations: Flu up to date Blue.Reese  ]; Pneumococcal up to date [ y ];  Other:     PHYSICAL EXAMINATION: Wt Readings from Last 3 Encounters:  09/10/18 168 lb (76.2 kg)  08/27/18 167 lb 6.4 oz (75.9 kg)  08/20/18 166 lb 12.8 oz (75.7 kg)   Diagnostic Studies & Laboratory data:     Recent Radiology Findings:    Ct Chest W ContrastCt Abdomen Pelvis W Contrast  Result Date: 08/01/2018 CLINICAL DATA:  Primary adenocarcinoma of the esophagogastric junction. EXAM: CT CHEST, ABDOMEN, AND PELVIS WITH CONTRAST TECHNIQUE: Multidetector CT imaging of the chest, abdomen and pelvis was performed following the standard protocol during bolus administration of intravenous contrast. CONTRAST:  111mL ISOVUE-300 IOPAMIDOL (ISOVUE-300) INJECTION 61% COMPARISON:  No comparison studies available. FINDINGS: CT CHEST FINDINGS  Cardiovascular: The heart size is normal. No substantial pericardial effusion. Coronary artery calcification is evident. Atherosclerotic calcification is noted in the wall of the thoracic aorta. Mediastinum/Nodes: No mediastinal lymphadenopathy. There is no hilar lymphadenopathy. Mild wall thickening noted distal esophagus extending into the esophagogastric junction. There is no axillary lymphadenopathy. Lungs/Pleura: The central tracheobronchial airways are patent. No suspicious pulmonary nodule or mass. No focal airspace consolidation. No pulmonary edema or pleural effusion. Musculoskeletal: No worrisome lytic or sclerotic osseous abnormality. CT ABDOMEN PELVIS FINDINGS Hepatobiliary: 15 mm water density lesion in the medial segment left liver is probably a cyst. 7 mm low-density lesion in the dome of the liver (45/2) is too small to characterize. No overtly suspicious or enhancing liver lesion evident. There is no evidence for gallstones, gallbladder wall thickening, or pericholecystic fluid. No intrahepatic or extrahepatic biliary dilation. Pancreas: No focal mass lesion. No dilatation of the main duct. No intraparenchymal cyst.  No peripancreatic edema. Spleen: No splenomegaly. No focal mass lesion. Adrenals/Urinary Tract: Thickening of the adrenal glands evident bilaterally, left greater than right. Dominant nodular component in the left adrenal gland measures 2.8 cm. Relative washout for this dominant nodular component in the left adrenal gland is 56%, most suggestive of adenoma. Kidneys unremarkable. No evidence for hydroureter. The urinary bladder appears normal for the degree of distention. Stomach/Bowel: Wall thickening is noted in the esophagogastric junction/cardia. Stomach otherwise unremarkable. Duodenum is normally positioned as is the ligament of Treitz. No small bowel wall thickening. No small bowel dilatation. The terminal ileum is normal. The appendix is normal. No gross colonic mass. No colonic  wall thickening. Vascular/Lymphatic: There is abdominal aortic atherosclerosis without aneurysm. 2.8 x 1.3 cm node versus nodal conglomeration identified in the gastrohepatic ligament (53/2). Small 7 x 11 mm perigastric node visible on 51/2. No hepato duodenal ligament lymphadenopathy. Small para-aortic retroperitoneal nodes evident without retroperitoneal lymphadenopathy. Reproductive: Prostate gland is enlarged. Other: No intraperitoneal free fluid. Musculoskeletal: Bilateral groin hernias contain only fat. Tiny sclerotic focus right ischial tuberosity, likely a bone island. No worrisome lytic or sclerotic osseous abnormality. Degenerative changes noted diffusely in the lumbar spine. IMPRESSION: 1. Circumferential wall thickening noted in the distal esophagus extending into the esophagogastric junction and gastric cardia compatible with patient's known neoplasm. 2. 2.8 x 1.3 cm collection of small nodes versus single irregular enlarged lymph node in the gastrohepatic ligament concerning for metastatic disease. There is an adjacent 7 x 11 mm perigastric node, also concerning. 3. 15 mm lesion in the medial segment left liver has imaging features most suggestive of a simple cyst. 7 mm low-density lesion in the dome of the liver, too small to characterize and attention on follow-up recommended. 4. Bilateral nodular thickening of the adrenal glands with imaging features most suggestive of adenomatous enlargement. 5.  Aortic Atherosclerois (ICD10-170.0) Electronically Signed   By: Misty Stanley M.D.   On: 08/01/2018 11:41   Nm Pet Image Initial (pi) Skull Base To Thigh  Result Date: 08/09/2018 CLINICAL DATA:  Initial treatment strategy for esophageal cancer. EXAM: NUCLEAR MEDICINE PET SKULL BASE TO THIGH TECHNIQUE: 8.4 mCi F-18 FDG was injected intravenously. Full-ring PET imaging was performed from the skull base to thigh after the radiotracer. CT data was obtained and used for attenuation correction and anatomic  localization. Fasting blood glucose: 103 mg/dl COMPARISON:  CT scan 08/01/2018 FINDINGS: Mediastinal blood pool activity: SUV max 2.6 NECK: No significant abnormal hypermetabolic activity in this region. Incidental CT findings: Complete opacification of the left frontal sinus and multiple left anterior ethmoid air cells, which could reflect chronic or acute sinusitis. Subtotal opacification left maxillary sinus from chronic sinusitis. Suspected upper left nasal mucosal polyps. Bilateral common carotid artery atherosclerotic calcification. CHEST: The distal esophageal component of the mass spanning the gastroesophageal junction has a maximum SUV of 9.0. An AP window lymph node measuring 0.8 cm in short axis on image 66/4 has a maximum SUV of 1.9. A left axillary node with fatty hilum measuring 1.1 cm in short axis on image 61/4 has a maximum SUV of 1.8. Incidental CT findings: Atherosclerotic calcification of the aortic arch and left anterior descending coronary artery. Borderline cardiomegaly. ABDOMEN/PELVIS: The gastric portion of the gastroesophageal mass primarily involves the gastric cardia and has a maximum SUV of 9.6. Adjacent clustered lymph nodes in the gastrohepatic ligament are observed individually measuring up to 1.0 cm in short axis on image 107/4, and collectively with a maximum SUV of 3.2.  The larger of the hypodense liver lesions is in the lateral segment left hepatic lobe and is photopenic, favoring a cyst. The smaller lesions are technically nonspecific due to small size, but demonstrate no appreciable hypermetabolic activity. 1.5 by 2.5 cm right adrenal adenoma. Thickened low-density left adrenal gland compatible with adenoma measuring about 4.4 by 1.9 cm. Neither adrenal gland is overtly hypermetabolic. Incidental CT findings: Aortoiliac atherosclerotic vascular disease. Prostatomegaly. SKELETON: No significant abnormal hypermetabolic activity in this region. Incidental CT findings: none  IMPRESSION: 1. Hypermetabolic mass spanning the gastroesophageal junction. The distal esophageal component has a maximum SUV of 9.0 and gastric cardia component has a maximum SUV of 9.6. Clustered gastrohepatic ligament lymph nodes are likely involved, with a maximum SUV of 3.2 which is mildly above the blood pool level. No compelling findings of hepatic metastatic disease at this time. 2. Other imaging findings of potential clinical significance: Complete opacification of the left frontal sinus and multiple ethmoid air cells which could be from acute or chronic sinusitis. Subtotal opacification of the left maxillary sinus from chronic sinusitis. Bilateral adrenal adenomas. Aortic Atherosclerosis (ICD10-I70.0). Coronary atherosclerosis. Prostatomegaly. Electronically Signed   By: Van Clines M.D.   On: 08/09/2018 09:08     I have independently reviewed the above radiology studies  and reviewed the findings with the patient.   Recent Lab Findings: Lab Results  Component Value Date   WBC 4.2 09/17/2018   HGB 13.0 09/17/2018   HCT 40.1 09/17/2018   PLT 188 09/17/2018   GLUCOSE 101 (H) 09/17/2018   CHOL 161 05/06/2018   TRIG 134.0 05/06/2018   HDL 54.20 05/06/2018   LDLDIRECT 131.7 12/27/2009   LDLCALC 80 05/06/2018   ALT 16 09/17/2018   AST 16 09/17/2018   NA 140 09/17/2018   K 4.3 09/17/2018   CL 109 09/17/2018   CREATININE 0.79 09/17/2018   BUN 14 09/17/2018   CO2 22 09/17/2018   TSH 1.24 05/06/2018   INR 1.03 07/05/2018   HGBA1C 5.4 05/06/2018   ENDO:07/25/2018 A large, submucosal and ulcerating mass with bleeding and stigmata of recent bleeding was found in the distal esophagus, at the gastroesophageal junction and in the cardia, 38 cm from the incisors. The mass was non-obstructing. Biopsies were taken with a cold forceps for histology. Verification of patient identification for the specimen was done. Estimated blood loss was minimal. Findings: - Patchy mucosal changes  characterized by nodularity were found in the distal esophagus. Biopsies were taken with a cold forceps for histology. Verification of patient identification for the specimen was done. Estimated blood loss was minimal. - The exam was otherwise without abnormality. Add'l Images: Upper Gastrointestinal Tract Lower Third  Path: Diagnosis 1. Esophagogastric junction, biopsy, mass - ADENOCARCINOMA, SEE NOTE. 2. Esophagus, biopsy, distal esophagus nodular - ADENOCARCINOMA, SEE NOTE. 3. Colon, biopsy, cecum and ascending, polyp (2) - TUBULAR ADENOMA(S). - NEGATIVE FOR HIGH GRADE DYSPLASIA OR MALIGNANCY. Diagnosis Note 1. 1, 2. Dr Lyndon Code has reviewed this case and concurs with the above interpretation. Dr [name] was notified on 07/26/2018. (NK:ecj 07/26/2018) Jaquita Folds MD  Assessment / Plan:    advanced stage adenocarcinoma involving the distal third of the esophagus and cardia of the stomach, GE junction, Siewert-Stein Type II  Likely clinicial  stage III  Currently undergoing chemo and radiation prior to consideration of surgical resection Last dose of chemotherapy March 31, last dose of radiation scheduled for April 10  While in the office I called the patient on the phone and had  virtual visit with he and his wife.  We discussed his current symptoms weight ability to eat and how he was tolerating treatments.  We discussed obtaining a CT scanning of the chest and abdomen with and without contrast including oral contrast on Thursday, May 7 and seeing me in the office at that time.  Depending on the findings on the recent staging CT scan will discuss surgical resection and timing.  The patient and his wife had their questions answered.  Talk with them on the phone about 20 minutes.    [name] MD      [address] [place] [address] Office [phone]   Beeper [phone]  09/19/2018 2:30 PM

## 2018-09-19 NOTE — Telephone Encounter (Signed)
See telphone note

## 2018-09-20 ENCOUNTER — Other Ambulatory Visit: Payer: Self-pay

## 2018-09-20 ENCOUNTER — Other Ambulatory Visit: Payer: Self-pay | Admitting: *Deleted

## 2018-09-20 ENCOUNTER — Ambulatory Visit
Admission: RE | Admit: 2018-09-20 | Discharge: 2018-09-20 | Disposition: A | Discharge status: Home / Self Care | Payer: Medicare Other | Source: Ambulatory Visit | Attending: Radiation Oncology | Admitting: Radiation Oncology

## 2018-09-20 DIAGNOSIS — Z510 Encounter for antineoplastic radiation therapy: Secondary | ICD-10-CM | POA: Diagnosis not present

## 2018-09-23 ENCOUNTER — Ambulatory Visit
Admission: RE | Admit: 2018-09-23 | Discharge: 2018-09-23 | Disposition: A | Discharge status: Home / Self Care | Payer: Medicare Other | Source: Ambulatory Visit | Attending: Radiation Oncology | Admitting: Radiation Oncology

## 2018-09-23 ENCOUNTER — Other Ambulatory Visit: Payer: Self-pay

## 2018-09-23 ENCOUNTER — Other Ambulatory Visit: Payer: Self-pay | Admitting: *Deleted

## 2018-09-23 DIAGNOSIS — Z510 Encounter for antineoplastic radiation therapy: Secondary | ICD-10-CM | POA: Diagnosis not present

## 2018-09-23 DIAGNOSIS — C155 Malignant neoplasm of lower third of esophagus: Secondary | ICD-10-CM

## 2018-09-23 DIAGNOSIS — C159 Malignant neoplasm of esophagus, unspecified: Secondary | ICD-10-CM

## 2018-09-24 ENCOUNTER — Other Ambulatory Visit: Payer: Self-pay

## 2018-09-24 ENCOUNTER — Ambulatory Visit
Admission: RE | Admit: 2018-09-24 | Discharge: 2018-09-24 | Disposition: A | Discharge status: Home / Self Care | Payer: Medicare Other | Source: Ambulatory Visit | Attending: Radiation Oncology | Admitting: Radiation Oncology

## 2018-09-24 DIAGNOSIS — Z510 Encounter for antineoplastic radiation therapy: Secondary | ICD-10-CM | POA: Diagnosis not present

## 2018-09-25 ENCOUNTER — Other Ambulatory Visit: Payer: Self-pay

## 2018-09-25 ENCOUNTER — Other Ambulatory Visit: Payer: Self-pay | Admitting: *Deleted

## 2018-09-25 ENCOUNTER — Ambulatory Visit
Admission: RE | Admit: 2018-09-25 | Discharge: 2018-09-25 | Disposition: A | Discharge status: Home / Self Care | Payer: Medicare Other | Source: Ambulatory Visit | Attending: Radiation Oncology | Admitting: Radiation Oncology

## 2018-09-25 DIAGNOSIS — Z510 Encounter for antineoplastic radiation therapy: Secondary | ICD-10-CM | POA: Diagnosis not present

## 2018-09-26 ENCOUNTER — Other Ambulatory Visit: Payer: Self-pay | Admitting: Cardiothoracic Surgery

## 2018-09-26 ENCOUNTER — Other Ambulatory Visit: Payer: Self-pay

## 2018-09-26 ENCOUNTER — Ambulatory Visit
Admission: RE | Admit: 2018-09-26 | Discharge: 2018-09-26 | Disposition: A | Discharge status: Home / Self Care | Payer: Medicare Other | Source: Ambulatory Visit | Attending: Radiation Oncology | Admitting: Radiation Oncology

## 2018-09-26 DIAGNOSIS — C159 Malignant neoplasm of esophagus, unspecified: Secondary | ICD-10-CM

## 2018-09-26 DIAGNOSIS — Z510 Encounter for antineoplastic radiation therapy: Secondary | ICD-10-CM | POA: Diagnosis not present

## 2018-09-26 DIAGNOSIS — C160 Malignant neoplasm of cardia: Secondary | ICD-10-CM

## 2018-09-27 ENCOUNTER — Encounter: Payer: Self-pay | Admitting: Radiation Oncology

## 2018-09-27 ENCOUNTER — Ambulatory Visit
Admission: RE | Admit: 2018-09-27 | Discharge: 2018-09-27 | Disposition: A | Discharge status: Home / Self Care | Payer: Medicare Other | Source: Ambulatory Visit | Attending: Radiation Oncology | Admitting: Radiation Oncology

## 2018-09-27 ENCOUNTER — Other Ambulatory Visit: Payer: Self-pay

## 2018-09-27 DIAGNOSIS — Z510 Encounter for antineoplastic radiation therapy: Secondary | ICD-10-CM | POA: Diagnosis not present

## 2018-10-01 ENCOUNTER — Other Ambulatory Visit: Payer: Self-pay | Admitting: Oncology

## 2018-10-10 ENCOUNTER — Telehealth: Payer: Self-pay | Admitting: *Deleted

## 2018-10-10 ENCOUNTER — Ambulatory Visit: Payer: Medicare Other | Admitting: Oncology

## 2018-10-10 MED ORDER — PANTOPRAZOLE SODIUM 40 MG PO TBEC: 40.0000 mg | DELAYED_RELEASE_TABLET | Freq: Every day | ORAL | 1 refills | Status: DC

## 2018-10-10 NOTE — Telephone Encounter (Signed)
Called patient to move his office visit to 10/25/18 after his CT scan. He agrees to the change,but reports he is experiencing a lot of heartburn. Has nothing to take for it. Dr. Benay Spice agrees to try Protonix 40 mg daily. Script sent to his pharmacy.

## 2018-10-11 ENCOUNTER — Ambulatory Visit: Payer: Medicare Other | Admitting: Oncology

## 2018-10-24 ENCOUNTER — Ambulatory Visit: Payer: Medicare Other | Admitting: Cardiothoracic Surgery

## 2018-10-24 ENCOUNTER — Encounter: Payer: Self-pay | Admitting: Cardiothoracic Surgery

## 2018-10-24 ENCOUNTER — Other Ambulatory Visit: Payer: Self-pay

## 2018-10-24 ENCOUNTER — Ambulatory Visit
Admission: RE | Admit: 2018-10-24 | Discharge: 2018-10-24 | Disposition: A | Discharge status: Home / Self Care | Payer: Medicare Other | Source: Ambulatory Visit | Attending: Cardiothoracic Surgery | Admitting: Cardiothoracic Surgery

## 2018-10-24 VITALS — BP 124/78 | HR 88 | Temp 87.0°F | Resp 16 | Ht 71.0 in | Wt 152.0 lb

## 2018-10-24 DIAGNOSIS — C155 Malignant neoplasm of lower third of esophagus: Secondary | ICD-10-CM

## 2018-10-24 DIAGNOSIS — C160 Malignant neoplasm of cardia: Secondary | ICD-10-CM | POA: Diagnosis not present

## 2018-10-24 DIAGNOSIS — C159 Malignant neoplasm of esophagus, unspecified: Secondary | ICD-10-CM

## 2018-10-24 MED ORDER — IOPAMIDOL (ISOVUE-300) INJECTION 61%
100.0000 mL | Freq: Once | INTRAVENOUS | Status: AC | PRN
  Administered 2018-10-24: 100 mL via INTRAVENOUS

## 2018-10-24 MED ORDER — IOPAMIDOL (ISOVUE-300) INJECTION 61%: 100.0000 mL | Freq: Once | INTRAVENOUS | Status: DC | PRN

## 2018-10-24 NOTE — Progress Notes (Signed)
[address]       [place] [address]             [phone]                    [name] [name] Medical Record #[id number] Date of Birth: 1950-06-29  Referring: Ladell Pier, MD Primary Care: Binnie Rail, MD Primary Cardiologist: No primary care provider on file.  Chief Complaint:    Chief Complaint  Patient presents with   Esophageal Cancer    further discuss surgery with C/A/P CT    History of Present Illness:    [name] [name] 68 y.o. 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  The patient's disease first came to medical attention when he presented to his primary care doctor with new anemia, repeat labs confirmed anemia.  Patient denies any known observed blood in his stool.  He denies any loss of weight, he has noted some very mild discomfort with swallowing, but not to the extent of interfering with his diet   Colonoscopy and upper GI endoscopy were performed.  Upper GI endoscopy showed  A large, submucosal and ulcerating mass with bleeding and stigmata of recent bleeding was found in the distal esophagus, at the gastroesophageal junction and in the cardia, 38 cm from the incisors.  CT and PET scans have been done. 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  He notes early in his career in the 1980s he did do some work at the power plant that could have exposed him to asbestos.  Patient has been a smoker for proximately half a pack a day for 50 years. He denies any previous history of reflux  ? Radiation beginning 08/21/2018 ? Cycle 1 weekly Taxol/carboplatin 08/21/2018 ? Cycle 2 weekly Taxol/carboplatin 08/27/2018 ? Cycle 3 weekly Taxol/carboplatin 09/03/2018 ? Cycle 4 weekly Taxol/carboplatin 09/10/2018  Cycle 5 weekly Taxol/carboplatin 09/17/2018  Patient  notes that his radiation therapy was completed March 31, last chemotherapy was April 7.  He has not been smoking for the last 3 months, has been doing reasonably well with his weight maintaining 150 to 254 pounds.  He has had vague epigastric discomfort and question of shortness of breath with increased activity, he notes this is not related to eating.  Current Activity/ Functional Status:  Patient is independent with mobility/ambulation, transfers, ADL's, IADL's.   Zubrod Score: At the time of surgery this patients most appropriate activity status/level should be described as: [x]     0    Normal activity, no symptoms []     1    Restricted in physical strenuous activity but ambulatory, able to do out light work []     2    Ambulatory and capable of self care, unable to do work activities, up and about               >50 % of waking hours                              []     3    Only limited self care, in bed greater than 50% of waking hours []     4    Completely disabled, no self care, confined  to bed or chair []     5    Moribund   Past Medical History:  Diagnosis Date   Adenocarcinoma of gastroesophageal junction ([place]) 07/29/2018   Anemia    Diverticulosis    Hyperlipidemia    Hypertension    Tobacco abuse     Past Surgical History:  Procedure Laterality Date   COLONOSCOPY     WISDOM TOOTH EXTRACTION      Family History  Problem Relation Age of Onset   Cancer Mother        bone cancer   Heart attack Father 53   Cancer Sister        uterine cancer   Heart attack Paternal Uncle        X2, both < 55   COPD Neg Hx    Diabetes Neg Hx    Stroke Neg Hx    Colon cancer Neg Hx    Patient's mother died in her 41s of "bone cancer she had one sister died of cancer of unknown cause his father died at age 58 grandfather died at age 35 and one brother died at age 45 all of sudden death presumed to be a myocardial infarction  Social History   Tobacco Use  Smoking  Status Current Every Day Smoker   Packs/day: 0.25   Years: 50.00   Pack years: 12.50   Types: Cigarettes  Smokeless Tobacco Never Used  Tobacco Comment   06/02/14 2-3 pp week    Social History   Substance and Sexual Activity  Alcohol Use Yes   Alcohol/week: 2.0 standard drinks   Types: 2 Glasses of wine per week   Comment:  occasionally, not daily     No Known Allergies  Current Outpatient Medications  Medication Sig Dispense Refill   lidocaine (XYLOCAINE) 2 % solution Patient: Mix 1part 2% viscous lidocaine, 1part H20. Swallow 83mL of diluted mixture, 26min before meals and at bedtime, up to QID for soreness 100 mL 0   pantoprazole (PROTONIX) 40 MG tablet Take 1 tablet (40 mg total) by mouth daily. (Patient not taking: Reported on 10/24/2018) 30 tablet 1   prochlorperazine (COMPAZINE) 10 MG tablet TAKE 1 TABLET(10 MG) BY MOUTH EVERY 6 HOURS AS NEEDED (Patient not taking: Reported on 10/24/2018) 60 tablet 1   sucralfate (CARAFATE) 1 g tablet Dissolve 1 tablet in 10 mL H20 and swallow QID prn soreness in esophagus. (Patient not taking: Reported on 10/24/2018) 40 tablet 0   No current facility-administered medications for this visit.    Facility-Administered Medications Ordered in Other Visits  Medication Dose Route Frequency Provider Last Rate Last Dose   alum & mag hydroxide-simeth (MAALOX/MYLANTA) 200-200-20 MG/5ML suspension 30 mL  30 mL Oral Once Harle Stanford., PA-C       And   lidocaine (XYLOCAINE) 2 % viscous mouth solution 15 mL  15 mL Oral Once Harle Stanford., PA-C        Pertinent items are noted in HPI.   Review of Systems:     Cardiac Review of Systems: [Y] = yes  or   [ N ] = no   Chest Pain [ N]  Resting SOB [ N] Exertional SOB  [MILD ]  Orthopnea [  ]   Pedal Edema Aqua.Slicker ]    Palpitations Aqua.Slicker  ] Syncope  [ N]   Presyncope Aqua.Slicker  ]   General Review of Systems: [Y] = yes [  ]=no Constitional: recent weight change [none  ];  Wt  loss over the last 3 months [   ]  anorexia [  ]; fatigue [  ]; nausea [  ]; night sweats [  ]; fever [  ]; or chills [  ];           Eye : blurred vision [  ]; diplopia [   ]; vision changes [  ];  Amaurosis fugax[  ]; Resp: cough [  ];  wheezing[  ];  hemoptysis[  ]; shortness of breath[  ]; paroxysmal nocturnal dyspnea[  ]; dyspnea on exertion[  ]; or orthopnea[  ];  GI:  gallstones[  ], vomiting[  ];  dysphagia[  ]; melena[  ];  hematochezia [  ]; heartburn[  ];   Hx of  Colonoscopy[  ]; GU: kidney stones [  ]; hematuria[  ];   dysuria [  ];  nocturia[  ];  history of     obstruction [  ]; urinary frequency [  ]             Skin: rash, swelling[  ];, hair loss[  ];  peripheral edema[  ];  or itching[  ]; Musculosketetal: myalgias[  ];  joint swelling[  ];  joint erythema[  ];  joint pain[  ];  back pain[  ];  Heme/Lymph: bruising[  ];  bleeding[  ];  anemia[  ];  Neuro: TIA[  ];  headaches[  ];  stroke[  ];  vertigo[  ];  seizures[  ];   paresthesias[  ];  difficulty walking[  ];  Psych:depression[  ]; anxiety[  ];  Endocrine: diabetes[  ];  thyroid dysfunction[  ];  Immunizations: Flu up to date Jazmín.Cullens ]; Pneumococcal up to date [Y ];  Other:     PHYSICAL EXAMINATION: BP 124/78 (BP Location: Right Arm, Patient Position: Sitting, Cuff Size: Large)    Pulse 88    Temp (!) 87 F (30.6 C) (Skin)    Resp 16    Ht 5\' 11"  (1.803 m)    Wt 152 lb (68.9 kg)    SpO2 98% Comment: RA   BMI 21.20 kg/m  General appearance: alert, cooperative and no distress Head: Normocephalic, without obvious abnormality, atraumatic Neck: no adenopathy, no carotid bruit, no JVD, supple, symmetrical, trachea midline and thyroid not enlarged, symmetric, no tenderness/mass/nodules Lymph nodes: Cervical, supraclavicular, and axillary nodes normal. Resp: clear to auscultation bilaterally Back: symmetric, no curvature. ROM normal. No CVA tenderness. Cardio: regular rate and rhythm, S1, S2 normal, no murmur, click, rub or gallop GI: soft, non-tender; bowel  sounds normal; no masses,  no organomegaly Extremities: extremities normal, atraumatic, no cyanosis or edema and Homans sign is negative, no sign of DVT Neurologic: Grossly normal  Diagnostic Studies & Laboratory data:     Recent Radiology Findings:  Ct Chest W Contrast/ Ct Abdomen W Contrast   Result Date: 10/24/2018 CLINICAL DATA:  Esophageal cancer restaging EXAM: CT CHEST AND ABDOMEN WITH CONTRAST TECHNIQUE: Multidetector CT imaging of the chest and abdomen was performed following the standard protocol during bolus administration of intravenous contrast. CONTRAST:  185mL ISOVUE-300 IOPAMIDOL (ISOVUE-300) INJECTION 61% COMPARISON:  Multiple exams, including PET-CT from 08/09/2018 FINDINGS: CT CHEST FINDINGS Cardiovascular: Minimal atherosclerotic calcification of the aortic arch. Density in the left anterior descending coronary artery potentially atherosclerotic calcification or a stent. Mediastinum/Nodes: Small mediastinal and axillary lymph nodes are not pathologically enlarged and are similar to the prior exam. Continued circumferential distal esophageal wall thickening extending into the stomach. Lungs/Pleura: Unremarkable  Musculoskeletal: Mild thoracic spondylosis. CT ABDOMEN FINDINGS Hepatobiliary: The hypodense lesions in the left hepatic lobe are stable and probably cysts. No new or worrisome enhancing hepatic lesion. Gallbladder unremarkable. No biliary dilatation. Pancreas: Unremarkable Spleen: Unremarkable Adrenals/Urinary Tract: Stable appearance of bilateral adrenal adenomas. The kidneys appear unremarkable. Stomach/Bowel: The gastric cardia portion of the gastroesophageal mass is less striking than on the 08/09/2018 exam and less striking than on 08/01/2018. Adjacent gastrohepatic ligament adenopathy is still present with clustered nodes measuring 1.2 cm in short axis, previously the same on 08/01/2018. Vascular/Lymphatic: As noted above there are continued clustered gastrohepatic ligament  lymph nodes. An aortocaval node measures 0.8 cm in short axis on image 76/2, formerly the same, and not formerly hypermetabolic. Other: No supplemental non-categorized findings. Musculoskeletal: Mild right foraminal impingement at L4-5 due to spurring. IMPRESSION: 1. From a morphologic standpoint, the gastric cardia portion of the gastroesophageal mass is less striking than on the prior exam but the distal esophageal wall continues to appear thickened. The clustered gastrohepatic little lymph nodes which were previously mildly abnormally hypermetabolic have not significantly changed in size/appearance. 2. Other imaging findings of potential clinical significance: Aortic Atherosclerosis (ICD10-I70.0). Coronary atherosclerosis. Left hepatic lobe cysts. Bilateral adrenal adenomas. Mild right foraminal impingement at L4-5. Electronically Signed   By: Van Clines M.D.   On: 10/24/2018 12:18   I have independently reviewed the above radiology studies  and reviewed the findings with the patient.    Ct Chest W ContrastCt Abdomen Pelvis W Contrast  Result Date: 08/01/2018 CLINICAL DATA:  Primary adenocarcinoma of the esophagogastric junction. EXAM: CT CHEST, ABDOMEN, AND PELVIS WITH CONTRAST TECHNIQUE: Multidetector CT imaging of the chest, abdomen and pelvis was performed following the standard protocol during bolus administration of intravenous contrast. CONTRAST:  162mL ISOVUE-300 IOPAMIDOL (ISOVUE-300) INJECTION 61% COMPARISON:  No comparison studies available. FINDINGS: CT CHEST FINDINGS Cardiovascular: The heart size is normal. No substantial pericardial effusion. Coronary artery calcification is evident. Atherosclerotic calcification is noted in the wall of the thoracic aorta. Mediastinum/Nodes: No mediastinal lymphadenopathy. There is no hilar lymphadenopathy. Mild wall thickening noted distal esophagus extending into the esophagogastric junction. There is no axillary lymphadenopathy. Lungs/Pleura: The  central tracheobronchial airways are patent. No suspicious pulmonary nodule or mass. No focal airspace consolidation. No pulmonary edema or pleural effusion. Musculoskeletal: No worrisome lytic or sclerotic osseous abnormality. CT ABDOMEN PELVIS FINDINGS Hepatobiliary: 15 mm water density lesion in the medial segment left liver is probably a cyst. 7 mm low-density lesion in the dome of the liver (45/2) is too small to characterize. No overtly suspicious or enhancing liver lesion evident. There is no evidence for gallstones, gallbladder wall thickening, or pericholecystic fluid. No intrahepatic or extrahepatic biliary dilation. Pancreas: No focal mass lesion. No dilatation of the main duct. No intraparenchymal cyst. No peripancreatic edema. Spleen: No splenomegaly. No focal mass lesion. Adrenals/Urinary Tract: Thickening of the adrenal glands evident bilaterally, left greater than right. Dominant nodular component in the left adrenal gland measures 2.8 cm. Relative washout for this dominant nodular component in the left adrenal gland is 56%, most suggestive of adenoma. Kidneys unremarkable. No evidence for hydroureter. The urinary bladder appears normal for the degree of distention. Stomach/Bowel: Wall thickening is noted in the esophagogastric junction/cardia. Stomach otherwise unremarkable. Duodenum is normally positioned as is the ligament of Treitz. No small bowel wall thickening. No small bowel dilatation. The terminal ileum is normal. The appendix is normal. No gross colonic mass. No colonic wall thickening. Vascular/Lymphatic: There is abdominal aortic atherosclerosis  without aneurysm. 2.8 x 1.3 cm node versus nodal conglomeration identified in the gastrohepatic ligament (53/2). Small 7 x 11 mm perigastric node visible on 51/2. No hepato duodenal ligament lymphadenopathy. Small para-aortic retroperitoneal nodes evident without retroperitoneal lymphadenopathy. Reproductive: Prostate gland is enlarged. Other: No  intraperitoneal free fluid. Musculoskeletal: Bilateral groin hernias contain only fat. Tiny sclerotic focus right ischial tuberosity, likely a bone island. No worrisome lytic or sclerotic osseous abnormality. Degenerative changes noted diffusely in the lumbar spine. IMPRESSION: 1. Circumferential wall thickening noted in the distal esophagus extending into the esophagogastric junction and gastric cardia compatible with patient's known neoplasm. 2. 2.8 x 1.3 cm collection of small nodes versus single irregular enlarged lymph node in the gastrohepatic ligament concerning for metastatic disease. There is an adjacent 7 x 11 mm perigastric node, also concerning. 3. 15 mm lesion in the medial segment left liver has imaging features most suggestive of a simple cyst. 7 mm low-density lesion in the dome of the liver, too small to characterize and attention on follow-up recommended. 4. Bilateral nodular thickening of the adrenal glands with imaging features most suggestive of adenomatous enlargement. 5.  Aortic Atherosclerois (ICD10-170.0) Electronically Signed   By: Misty Stanley M.D.   On: 08/01/2018 11:41   Nm Pet Image Initial (pi) Skull Base To Thigh  Result Date: 08/09/2018 CLINICAL DATA:  Initial treatment strategy for esophageal cancer. EXAM: NUCLEAR MEDICINE PET SKULL BASE TO THIGH TECHNIQUE: 8.4 mCi F-18 FDG was injected intravenously. Full-ring PET imaging was performed from the skull base to thigh after the radiotracer. CT data was obtained and used for attenuation correction and anatomic localization. Fasting blood glucose: 103 mg/dl COMPARISON:  CT scan 08/01/2018 FINDINGS: Mediastinal blood pool activity: SUV max 2.6 NECK: No significant abnormal hypermetabolic activity in this region. Incidental CT findings: Complete opacification of the left frontal sinus and multiple left anterior ethmoid air cells, which could reflect chronic or acute sinusitis. Subtotal opacification left maxillary sinus from chronic  sinusitis. Suspected upper left nasal mucosal polyps. Bilateral common carotid artery atherosclerotic calcification. CHEST: The distal esophageal component of the mass spanning the gastroesophageal junction has a maximum SUV of 9.0. An AP window lymph node measuring 0.8 cm in short axis on image 66/4 has a maximum SUV of 1.9. A left axillary node with fatty hilum measuring 1.1 cm in short axis on image 61/4 has a maximum SUV of 1.8. Incidental CT findings: Atherosclerotic calcification of the aortic arch and left anterior descending coronary artery. Borderline cardiomegaly. ABDOMEN/PELVIS: The gastric portion of the gastroesophageal mass primarily involves the gastric cardia and has a maximum SUV of 9.6. Adjacent clustered lymph nodes in the gastrohepatic ligament are observed individually measuring up to 1.0 cm in short axis on image 107/4, and collectively with a maximum SUV of 3.2. The larger of the hypodense liver lesions is in the lateral segment left hepatic lobe and is photopenic, favoring a cyst. The smaller lesions are technically nonspecific due to small size, but demonstrate no appreciable hypermetabolic activity. 1.5 by 2.5 cm right adrenal adenoma. Thickened low-density left adrenal gland compatible with adenoma measuring about 4.4 by 1.9 cm. Neither adrenal gland is overtly hypermetabolic. Incidental CT findings: Aortoiliac atherosclerotic vascular disease. Prostatomegaly. SKELETON: No significant abnormal hypermetabolic activity in this region. Incidental CT findings: none IMPRESSION: 1. Hypermetabolic mass spanning the gastroesophageal junction. The distal esophageal component has a maximum SUV of 9.0 and gastric cardia component has a maximum SUV of 9.6. Clustered gastrohepatic ligament lymph nodes are likely involved, with  a maximum SUV of 3.2 which is mildly above the blood pool level. No compelling findings of hepatic metastatic disease at this time. 2. Other imaging findings of potential  clinical significance: Complete opacification of the left frontal sinus and multiple ethmoid air cells which could be from acute or chronic sinusitis. Subtotal opacification of the left maxillary sinus from chronic sinusitis. Bilateral adrenal adenomas. Aortic Atherosclerosis (ICD10-I70.0). Coronary atherosclerosis. Prostatomegaly. Electronically Signed   By: Van Clines M.D.   On: 08/09/2018 09:08     I have independently reviewed the above radiology studies  and reviewed the findings with the patient.   Recent Lab Findings: Lab Results  Component Value Date   WBC 4.2 09/17/2018   HGB 13.0 09/17/2018   HCT 40.1 09/17/2018   PLT 188 09/17/2018   GLUCOSE 101 (H) 09/17/2018   CHOL 161 05/06/2018   TRIG 134.0 05/06/2018   HDL 54.20 05/06/2018   LDLDIRECT 131.7 12/27/2009   LDLCALC 80 05/06/2018   ALT 16 09/17/2018   AST 16 09/17/2018   NA 140 09/17/2018   K 4.3 09/17/2018   CL 109 09/17/2018   CREATININE 0.79 09/17/2018   BUN 14 09/17/2018   CO2 22 09/17/2018   TSH 1.24 05/06/2018   INR 1.03 07/05/2018   HGBA1C 5.4 05/06/2018   ENDO:07/25/2018 A large, submucosal and ulcerating mass with bleeding and stigmata of recent bleeding was found in the distal esophagus, at the gastroesophageal junction and in the cardia, 38 cm from the incisors. The mass was non-obstructing. Biopsies were taken with a cold forceps for histology. Verification of patient identification for the specimen was done. Estimated blood loss was minimal. Findings: - Patchy mucosal changes characterized by nodularity were found in the distal esophagus. Biopsies were taken with a cold forceps for histology. Verification of patient identification for the specimen was done. Estimated blood loss was minimal. - The exam was otherwise without abnormality. Add'l Images: Upper Gastrointestinal Tract Lower Third  Path: Diagnosis 1. Esophagogastric junction, biopsy, mass - ADENOCARCINOMA, SEE NOTE. 2.  Esophagus, biopsy, distal esophagus nodular - ADENOCARCINOMA, SEE NOTE. 3. Colon, biopsy, cecum and ascending, polyp (2) - TUBULAR ADENOMA(S). - NEGATIVE FOR HIGH GRADE DYSPLASIA OR MALIGNANCY. Diagnosis Note 1. 1, 2. Dr Lyndon Code has reviewed this case and concurs with the above interpretation. Dr [name] was notified on 07/26/2018. (NK:ecj 07/26/2018) Jaquita Folds MD    Assessment / Plan:   #1 advanced stage adenocarcinoma involving the distal third of the esophagus and cardia of the stomach, GE junction, Siewert-Stein Type II  Likely clinicial  stage III the patient has completed a course of preoperative radiation and chemotherapy which she is tolerated well.  CT scan shows evidence of response, no evidence of  metastatic disease.  With the patient's history of smoking, evidence of coronary calcification on CT scan, and vague symptoms of chest discomfort with exertion the patient has been referred for preoperative clearance by cardiology  I discussed with he and his wife in detail proceeding with esophageal resection, possible transhiatal and placement of feeding tube.  We will plan to see him back after his cardiac clearance on May 20, with tentatively proceeding with esophagectomy in early June.       [name] MD      [address] ,[place] [address] Office [phone]   Beeper [phone]  10/24/2018 12:45 PM

## 2018-10-25 ENCOUNTER — Telehealth: Payer: Self-pay | Admitting: Nurse Practitioner

## 2018-10-25 ENCOUNTER — Other Ambulatory Visit: Payer: Self-pay

## 2018-10-25 ENCOUNTER — Inpatient Hospital Stay: Payer: Medicare Other | Attending: Nurse Practitioner | Admitting: Nurse Practitioner

## 2018-10-25 ENCOUNTER — Telehealth: Payer: Self-pay | Admitting: *Deleted

## 2018-10-25 VITALS — BP 132/89 | HR 85 | Temp 98.0°F | Resp 18 | Ht 71.0 in | Wt 159.0 lb

## 2018-10-25 DIAGNOSIS — C155 Malignant neoplasm of lower third of esophagus: Secondary | ICD-10-CM

## 2018-10-25 DIAGNOSIS — D3502 Benign neoplasm of left adrenal gland: Secondary | ICD-10-CM | POA: Insufficient documentation

## 2018-10-25 DIAGNOSIS — Z79899 Other long term (current) drug therapy: Secondary | ICD-10-CM | POA: Diagnosis not present

## 2018-10-25 DIAGNOSIS — K635 Polyp of colon: Secondary | ICD-10-CM | POA: Diagnosis not present

## 2018-10-25 DIAGNOSIS — K7689 Other specified diseases of liver: Secondary | ICD-10-CM | POA: Insufficient documentation

## 2018-10-25 DIAGNOSIS — D3501 Benign neoplasm of right adrenal gland: Secondary | ICD-10-CM

## 2018-10-25 DIAGNOSIS — K30 Functional dyspepsia: Secondary | ICD-10-CM | POA: Insufficient documentation

## 2018-10-25 DIAGNOSIS — D369 Benign neoplasm, unspecified site: Secondary | ICD-10-CM | POA: Insufficient documentation

## 2018-10-25 DIAGNOSIS — K219 Gastro-esophageal reflux disease without esophagitis: Secondary | ICD-10-CM

## 2018-10-25 DIAGNOSIS — I10 Essential (primary) hypertension: Secondary | ICD-10-CM | POA: Insufficient documentation

## 2018-10-25 DIAGNOSIS — I2510 Atherosclerotic heart disease of native coronary artery without angina pectoris: Secondary | ICD-10-CM | POA: Insufficient documentation

## 2018-10-25 DIAGNOSIS — F17210 Nicotine dependence, cigarettes, uncomplicated: Secondary | ICD-10-CM | POA: Insufficient documentation

## 2018-10-25 NOTE — Telephone Encounter (Signed)
Phone visit- May 11,2020-pt only has home phone.  No cell phone. Consent obtained May 8,2020.  I spoke with pt. He reports this visit has been scheduled so he can be evaluated by cardiology prior to upcoming surgery.    Virtual Visit Pre-Appointment Phone Call  "(Name), I am calling you today to discuss your upcoming appointment. We are currently trying to limit exposure to the virus that causes COVID-19 by seeing patients at home rather than in the office."  "What is the BEST phone number to call the day of the visit?" - [phone]   1. "Do you have or have access to (through a family member/friend) a smartphone with video capability that we can use for your visit?" NO a. If yes - list this number in appt notes as "cell" (if different from BEST phone #) and list the appointment type as a VIDEO visit in appointment notes b. If no - list the appointment type as a PHONE visit in appointment notes  2. Confirm consent - "In the setting of the current Covid19 crisis, you are scheduled for a phone visit with your provider on May 11,2020 at 11:30  Just as we do with many in-office visits, in order for you to participate in this visit, we must obtain consent.  If you'd like, I can send this to your mychart (if signed up) or email for you to review.  Otherwise, I can obtain your verbal consent now.  All virtual visits are billed to your insurance company just like a normal visit would be.  By agreeing to a virtual visit, we'd like you to understand that the technology does not allow for your provider to perform an examination, and thus may limit your provider's ability to fully assess your condition. If your provider identifies any concerns that need to be evaluated in person, we will make arrangements to do so.  Finally, though the technology is pretty good, we cannot assure that it will always work on either your or our end, and in the setting of a video visit, we may have to convert it to a  phone-only visit.  In either situation, we cannot ensure that we have a secure connection.  Are you willing to proceed?" STAFF: Did the patient verbally acknowledge consent to telehealth visit? Document YES/NO here: yes  3. Advise patient to be prepared - "Two hours prior to your appointment, go ahead and check your blood pressure, pulse, oxygen saturation, and your weight (if you have the equipment to check those) and write them all down. When your visit starts, your provider will ask you for this information. If you have an Apple Watch or Kardia device, please plan to have heart rate information ready on the day of your appointment. Please have a pen and paper handy nearby the day of the visit as well."  4. Give patient instructions for MyChart download to smartphone OR Doximity/Doxy.me as below if video visit (depending on what platform provider is using)  5. Inform patient they will receive a phone call 15 minutes prior to their appointment time (may be from unknown caller ID) so they should be prepared to answer    TELEPHONE CALL NOTE  [name] [name] has been deemed a candidate for a follow-up tele-health visit to limit community exposure during the Covid-19 pandemic. I spoke with the patient via phone to ensure availability of phone/video source, confirm preferred email & phone number, and discuss instructions and expectations.  I reminded [name] [name] to  be prepared with any vital sign and/or heart rhythm information that could potentially be obtained via home monitoring, at the time of his visit. I reminded [name] [name] to expect a phone call prior to his visit.  Leodis Liverpool, RN 10/25/2018 9:25 AM   INSTRUCTIONS FOR DOWNLOADING THE MYCHART APP TO SMARTPHONE  - The patient must first make sure to have activated MyChart and know their login information - If Apple, go to [employer] and type in MyChart in the search bar and download the app. If Android, ask patient to go to [employer] and type in [place] in the search bar and download the app. The app is free but as with any other app downloads, their phone may require them to verify saved payment information or Apple/Android password.  - The patient will need to then log into the app with their MyChart username and password, and select [facility] as their healthcare provider to link the account. When it is time for your visit, go to the MyChart app, find appointments, and click Begin Video Visit. Be sure to Select Allow for your device to access the Microphone and Camera for your visit. You will then be connected, and your provider will be with you shortly.  **If they have any issues connecting, or need assistance please contact MyChart service desk [phone])**  **If using a computer, in order to ensure the best quality for their visit they will need to use either of the following Internet Browsers: [employer], or Google Chrome**  IF USING DOXIMITY or DOXY.ME - The patient will receive a link just prior to their visit by text.     FULL LENGTH CONSENT FOR TELE-HEALTH VISIT   I hereby voluntarily request, consent and authorize [facility] and its employed or contracted physicians, physician assistants, nurse practitioners or other licensed health care professionals (the Practitioner), to provide me with telemedicine health care services (the "Services") as deemed necessary by the treating Practitioner. I acknowledge and consent to receive the Services by the Practitioner via telemedicine. I understand that the telemedicine visit will involve communicating with the Practitioner through live audiovisual communication technology and the disclosure of certain medical information by electronic transmission. I acknowledge that I have been given the opportunity to request an in-person assessment or other available alternative prior to the telemedicine visit and am voluntarily participating in the telemedicine  visit.  I understand that I have the right to withhold or withdraw my consent to the use of telemedicine in the course of my care at any time, without affecting my right to future care or treatment, and that the Practitioner or I may terminate the telemedicine visit at any time. I understand that I have the right to inspect all information obtained and/or recorded in the course of the telemedicine visit and may receive copies of available information for a reasonable fee.  I understand that some of the potential risks of receiving the Services via telemedicine include:  Marland Kitchen Delay or interruption in medical evaluation due to technological equipment failure or disruption; . Information transmitted may not be sufficient (e.g. poor resolution of images) to allow for appropriate medical decision making by the Practitioner; and/or  . In rare instances, security protocols could fail, causing a breach of personal health information.  Furthermore, I acknowledge that it is my responsibility to provide information about my medical history, conditions and care that is complete and accurate to the best of my ability. I acknowledge that Practitioner's advice,  recommendations, and/or decision may be based on factors not within their control, such as incomplete or inaccurate data provided by me or distortions of diagnostic images or specimens that may result from electronic transmissions. I understand that the practice of medicine is not an exact science and that Practitioner makes no warranties or guarantees regarding treatment outcomes. I acknowledge that I will receive a copy of this consent concurrently upon execution via email to the email address I last provided but may also request a printed copy by calling the office of [facility].    I understand that my insurance will be billed for this visit.   I have read or had this consent read to me. . I understand the contents of this consent, which adequately explains  the benefits and risks of the Services being provided via telemedicine.  . I have been provided ample opportunity to ask questions regarding this consent and the Services and have had my questions answered to my satisfaction. . I give my informed consent for the services to be provided through the use of telemedicine in my medical care  By participating in this telemedicine visit I agree to the above.

## 2018-10-25 NOTE — Progress Notes (Addendum)
[facility] OFFICE PROGRESS NOTE   Diagnosis: Gastroesophageal cancer  INTERVAL HISTORY:   [name] returns as scheduled.  He completed cycle 5 weekly Taxol/carboplatin 09/17/2018.  He completed the course of radiation on 09/27/2018.  He denies nausea/vomiting.  No diarrhea.  No numbness or tingling in the hands or feet.  Fatigue is better.  He denies dysphagia.  Only complaint is indigestion.  He has tried Prilosec, Protonix and Carafate with no improvement.  Objective:  Vital signs in last 24 hours:  Blood pressure 132/89, pulse 85, temperature 98 F (36.7 C), temperature source Oral, resp. rate 18, height 5\' 11"  (1.803 m), weight 159 lb (72.1 kg), SpO2 99 %.    Physical examination not performed due to COVID-19 pandemic.  Lab Results:  Lab Results  Component Value Date   WBC 4.2 09/17/2018   HGB 13.0 09/17/2018   HCT 40.1 09/17/2018   MCV 86.4 09/17/2018   PLT 188 09/17/2018   NEUTROABS 3.1 09/17/2018    Imaging:  Ct Chest W Contrast  Result Date: 10/24/2018 CLINICAL DATA:  Esophageal cancer restaging EXAM: CT CHEST AND ABDOMEN WITH CONTRAST TECHNIQUE: Multidetector CT imaging of the chest and abdomen was performed following the standard protocol during bolus administration of intravenous contrast. CONTRAST:  142mL ISOVUE-300 IOPAMIDOL (ISOVUE-300) INJECTION 61% COMPARISON:  Multiple exams, including PET-CT from 08/09/2018 FINDINGS: CT CHEST FINDINGS Cardiovascular: Minimal atherosclerotic calcification of the aortic arch. Density in the left anterior descending coronary artery potentially atherosclerotic calcification or a stent. Mediastinum/Nodes: Small mediastinal and axillary lymph nodes are not pathologically enlarged and are similar to the prior exam. Continued circumferential distal esophageal wall thickening extending into the stomach. Lungs/Pleura: Unremarkable Musculoskeletal: Mild thoracic spondylosis. CT ABDOMEN FINDINGS Hepatobiliary: The hypodense  lesions in the left hepatic lobe are stable and probably cysts. No new or worrisome enhancing hepatic lesion. Gallbladder unremarkable. No biliary dilatation. Pancreas: Unremarkable Spleen: Unremarkable Adrenals/Urinary Tract: Stable appearance of bilateral adrenal adenomas. The kidneys appear unremarkable. Stomach/Bowel: The gastric cardia portion of the gastroesophageal mass is less striking than on the 08/09/2018 exam and less striking than on 08/01/2018. Adjacent gastrohepatic ligament adenopathy is still present with clustered nodes measuring 1.2 cm in short axis, previously the same on 08/01/2018. Vascular/Lymphatic: As noted above there are continued clustered gastrohepatic ligament lymph nodes. An aortocaval node measures 0.8 cm in short axis on image 76/2, formerly the same, and not formerly hypermetabolic. Other: No supplemental non-categorized findings. Musculoskeletal: Mild right foraminal impingement at L4-5 due to spurring. IMPRESSION: 1. From a morphologic standpoint, the gastric cardia portion of the gastroesophageal mass is less striking than on the prior exam but the distal esophageal wall continues to appear thickened. The clustered gastrohepatic little lymph nodes which were previously mildly abnormally hypermetabolic have not significantly changed in size/appearance. 2. Other imaging findings of potential clinical significance: Aortic Atherosclerosis (ICD10-I70.0). Coronary atherosclerosis. Left hepatic lobe cysts. Bilateral adrenal adenomas. Mild right foraminal impingement at L4-5. Electronically Signed   By: Van Clines M.D.   On: 10/24/2018 12:18   Ct Abdomen W Contrast  Result Date: 10/24/2018 CLINICAL DATA:  Esophageal cancer restaging EXAM: CT CHEST AND ABDOMEN WITH CONTRAST TECHNIQUE: Multidetector CT imaging of the chest and abdomen was performed following the standard protocol during bolus administration of intravenous contrast. CONTRAST:  123mL ISOVUE-300 IOPAMIDOL  (ISOVUE-300) INJECTION 61% COMPARISON:  Multiple exams, including PET-CT from 08/09/2018 FINDINGS: CT CHEST FINDINGS Cardiovascular: Minimal atherosclerotic calcification of the aortic arch. Density in the left anterior descending coronary artery potentially  atherosclerotic calcification or a stent. Mediastinum/Nodes: Small mediastinal and axillary lymph nodes are not pathologically enlarged and are similar to the prior exam. Continued circumferential distal esophageal wall thickening extending into the stomach. Lungs/Pleura: Unremarkable Musculoskeletal: Mild thoracic spondylosis. CT ABDOMEN FINDINGS Hepatobiliary: The hypodense lesions in the left hepatic lobe are stable and probably cysts. No new or worrisome enhancing hepatic lesion. Gallbladder unremarkable. No biliary dilatation. Pancreas: Unremarkable Spleen: Unremarkable Adrenals/Urinary Tract: Stable appearance of bilateral adrenal adenomas. The kidneys appear unremarkable. Stomach/Bowel: The gastric cardia portion of the gastroesophageal mass is less striking than on the 08/09/2018 exam and less striking than on 08/01/2018. Adjacent gastrohepatic ligament adenopathy is still present with clustered nodes measuring 1.2 cm in short axis, previously the same on 08/01/2018. Vascular/Lymphatic: As noted above there are continued clustered gastrohepatic ligament lymph nodes. An aortocaval node measures 0.8 cm in short axis on image 76/2, formerly the same, and not formerly hypermetabolic. Other: No supplemental non-categorized findings. Musculoskeletal: Mild right foraminal impingement at L4-5 due to spurring. IMPRESSION: 1. From a morphologic standpoint, the gastric cardia portion of the gastroesophageal mass is less striking than on the prior exam but the distal esophageal wall continues to appear thickened. The clustered gastrohepatic little lymph nodes which were previously mildly abnormally hypermetabolic have not significantly changed in size/appearance. 2.  Other imaging findings of potential clinical significance: Aortic Atherosclerosis (ICD10-I70.0). Coronary atherosclerosis. Left hepatic lobe cysts. Bilateral adrenal adenomas. Mild right foraminal impingement at L4-5. Electronically Signed   By: Van Clines M.D.   On: 10/24/2018 12:18    Medications: I have reviewed the patient's current medications.  Assessment/Plan: 1. 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 Upper endoscopy 07/25/2018-esophageal mass at 38-41 cm extending to the GE junction and gastric cardia, biopsy confirmed adenocarcinoma ? CTs 08/01/2018-wall thickening at the distal esophagus extending to the GE junction and gastric cardia, gastrohepatic ligament lymphadenopathy, perigastric lymph node, too small to characterize liver lesion, bilateral adrenal adenomas ? PET scan 08/09/2018-hypermetabolic mass spanning the gastroesophageal junction. Clustered gastrohepatic ligament lymph nodes likely involved with maximum SUV 3.2. ? Radiation 08/21/2018-09/27/2018 ? Cycle 1 weekly Taxol/carboplatin 08/21/2018 ? Cycle 2 weekly Taxol/carboplatin 08/27/2018 ? Cycle 3 weekly Taxol/carboplatin 09/03/2018 ? Cycle 4 weekly Taxol/carboplatin 09/10/2018 ? Cycle 5 weekly Taxol/carboplatin 09/17/2018 ? CTs 10/24/2018- gastric cardia portion of the gastroesophageal mass less striking than on the 08/09/2018 exam.  Adjacent gastrohepatic ligament adenopathy is still present with clustered nodes measuring 1.2 cm.  Aortocaval node measures 0.8 cm, formerly the same, not formally hypermetabolic.  2. Iron deficiency anemia secondary to #1,improved 3. Colon polyps, tubular adenomas, identified on colonoscopy 07/25/2018 4. Hypertension 5. Hyperlipidemia 6. Tobacco use   Disposition: [name] appears stable.  He completed the course of radiation and weekly Taxol/carboplatin.  Restaging CTs show some improvement in the gastroesophageal mass, stable adjacent gastrohepatic ligament  adenopathy.  He has seen Dr. Servando Snare regarding surgery.  He has been referred for cardiac clearance. [name] estimates surgery will be in mid June.  We will see him back in mid July.  The "indigestion" is likely related to radiation.  He will try Mylanta, Tums.  This will hopefully improve over the next few weeks.  He will contact the office prior to his next visit with any problems.  Patient seen with Dr. Benay Spice.    Ned Card ANP/GNP-BC   10/25/2018  1:31 PM  This was a shared visit with Ned Card.  [name] has completed neoadjuvant therapy.  The restaging CTs show no  evidence of disease progression.  He is being scheduled for surgery with Dr. Servando Snare.  We will see him after surgery.  [name] Manson, MD

## 2018-10-25 NOTE — Telephone Encounter (Signed)
Scheduled appt per 5/8 los. ° °A calendar will be mailed out. °

## 2018-10-28 ENCOUNTER — Encounter: Payer: Self-pay | Admitting: Cardiovascular Disease

## 2018-10-28 ENCOUNTER — Other Ambulatory Visit: Payer: Self-pay

## 2018-10-28 ENCOUNTER — Telehealth (INDEPENDENT_AMBULATORY_CARE_PROVIDER_SITE_OTHER): Payer: Medicare Other | Admitting: Cardiovascular Disease

## 2018-10-28 VITALS — BP 182/95 | HR 97 | Ht 71.0 in | Wt 152.0 lb

## 2018-10-28 DIAGNOSIS — Z01810 Encounter for preprocedural cardiovascular examination: Secondary | ICD-10-CM | POA: Diagnosis not present

## 2018-10-28 DIAGNOSIS — R0600 Dyspnea, unspecified: Secondary | ICD-10-CM | POA: Diagnosis not present

## 2018-10-28 DIAGNOSIS — I10 Essential (primary) hypertension: Secondary | ICD-10-CM | POA: Diagnosis not present

## 2018-10-28 NOTE — Patient Instructions (Signed)
Medication Instructions:  Your physician recommends that you continue on your current medications as directed. Please refer to the Current Medication list given to you today.  If you need a refill on your cardiac medications before your next appointment, please call your pharmacy.   Lab work: None If you have labs (blood work) drawn today and your tests are completely normal, you will receive your results only by: Marland Kitchen MyChart Message (if you have MyChart) OR . A paper copy in the mail If you have any lab test that is abnormal or we need to change your treatment, we will call you to review the results.  Testing/Procedures: Your physician has requested that you have a lexiscan myoview. For further information please visit [email or web address]. Please follow instruction sheet, as given.   Follow-Up: At [facility], you and your health needs are our priority.  As part of our continuing mission to provide you with exceptional heart care, we have created designated Provider Care Teams.  These Care Teams include your primary Cardiologist (physician) and Advanced Practice Providers (APPs -  Physician Assistants and Nurse Practitioners) who all work together to provide you with the care you need, when you need it. You will need a follow up appointment in 6 months.  Please call our office 2 months in advance to schedule this appointment.  You may see Lauree Chandler, MD or one of the following Advanced Practice Providers on your designated Care Team:   [place], PA-C Melina Copa, PA-C . Ermalinda Barrios, PA-C  Any Other Special Instructions Will Be Listed Below (If Applicable).

## 2018-10-28 NOTE — Progress Notes (Signed)
Virtual Visit via Telephone Note   This visit type was conducted due to national recommendations for restrictions regarding the COVID-19 Pandemic (e.g. social distancing) in an effort to limit this patient's exposure and mitigate transmission in our community.  Due to his co-morbid illnesses, this patient is at least at moderate risk for complications without adequate follow up.  This format is felt to be most appropriate for this patient at this time.  All issues noted in this document were discussed and addressed.  A limited physical exam was performed with this format.  Please refer to the patient's chart for his consent to telehealth for [facility].   Date:  10/28/2018   ID:  [name] [name], DOB 20-Nov-1950, MRN [id number]  Patient Location: Home Provider Location: Office  PCP:  [name] Rail, MD  Cardiologist:  new Electrophysiologist:  None   Evaluation Performed:  New patient visit  Chief Complaint: New patient-Cardiac risk assessment  History of Present Illness:    [name] [name] is a 68 y.o. male with history of gastroesophageal cancer, hyperlipidemia, HTN and tobacco abuse who is being seen today by virtual e-visit as a new patient for cardiac risk assessment prior to planned surgery. He was recently found to have adenocarcinoma of the distal esophagus. This was discovered during workup for anemia. He has been seen by Dr. Servando [name] in the CT surgery office. He has undergone radiation therapy and chemotherapy. He quit smoking 3 months ago. No prior known heart disease. He stopped his Lisinopril/HCTZ 2 months ago due to low BP during chemotherapy. His BP has been elevated over the past month at home.   The patient denies chest pain, palpitations, dizziness, near syncope or syncope. No lower extremity edema. Occasional dyspnea.   He does not have a smart phone. Visit with telephone only.   The patient does not have symptoms concerning for COVID-19 infection (fever, chills,  cough, or new shortness of breath).    Past Medical History:  Diagnosis Date   Adenocarcinoma of gastroesophageal junction ([place]) 07/29/2018   Anemia    Diverticulosis    Hyperlipidemia    Hypertension    Tobacco abuse    Past Surgical History:  Procedure Laterality Date   COLONOSCOPY     WISDOM TOOTH EXTRACTION       Current Meds  Medication Sig   alum & mag hydroxide-simeth (MAALOX/MYLANTA) 200-200-20 MG/5ML suspension Take 15 mLs by mouth every 6 (six) hours as needed for indigestion or heartburn.   lisinopril-hydrochlorothiazide (ZESTORETIC) 20-25 MG tablet Take 1 tablet by mouth daily.     Allergies:   Patient has no known allergies.   Social History   Tobacco Use   Smoking status: Current Every Day Smoker    Packs/day: 0.25    Years: 50.00    Pack years: 12.50    Types: Cigarettes   Smokeless tobacco: Never Used   Tobacco comment: 06/02/14 2-3 pp week  Substance Use Topics   Alcohol use: Yes    Alcohol/week: 2.0 standard drinks    Types: 2 Glasses of wine per week    Comment:  occasionally, not daily   Drug use: No     Family Hx: The patient's family history includes Cancer in his mother and sister; Heart attack in his paternal uncle; Heart attack (age of onset: 78) in his father. There is no history of COPD, Diabetes, Stroke, or Colon cancer.  ROS:   Please see the history of present illness.  All other systems reviewed and are negative.   Prior CV studies:   The following studies were reviewed today: None  Labs/Other Tests and Data Reviewed:    EKG:  January 2020 with sinus, incomplete RBBB, LAFB No EKG today due to virtual visit  Recent Labs: 05/06/2018: TSH 1.24 09/17/2018: ALT 16; BUN 14; Creatinine 0.79; Hemoglobin 13.0; Platelet Count 188; Potassium 4.3; Sodium 140   Recent Lipid Panel Lab Results  Component Value Date/Time   CHOL 161 05/06/2018 01:50 PM   TRIG 134.0 05/06/2018 01:50 PM   HDL 54.20 05/06/2018 01:50 PM    CHOLHDL 3 05/06/2018 01:50 PM   LDLCALC 80 05/06/2018 01:50 PM   LDLDIRECT 131.7 12/27/2009 08:28 AM    Wt Readings from Last 3 Encounters:  10/28/18 152 lb (68.9 kg)  10/25/18 159 lb (72.1 kg)  10/24/18 152 lb (68.9 kg)     Objective:    Vital Signs:  BP (!) 182/95    Pulse 97    Ht 5\' 11"  (1.803 m)    Wt 152 lb (68.9 kg)    BMI 21.20 kg/m    No exam. Phone visit  ASSESSMENT & PLAN:    1. Pre-operative cardiovascular risk assessment/Dyspnea: [name] [name] has no known heart disease. Risk factors for CAD include age, FH of CAD, smoking, HTN and hyperlipidemia. He has no symptoms worrisome for angina or CHF but given his risk factors for CAD, a nuclear stress test will be arranged prior to his major esophageal surgery in June 2020 to exclude ischemia.   2. HTN: BP is elevated today. Will have him restart his Lisinopril/HCTZ. He will follow his BP readings closely at home over the next few weeks.   COVID-19 Education: The signs and symptoms of COVID-19 were discussed with the patient and how to seek care for testing (follow up with PCP or arrange E-visit).  The importance of social distancing was discussed today.  Time:   Today, I have spent 25 minutes with the patient with telehealth technology discussing the above problems. I have also spent 20 minutes reviewing his records.  Total time 45 minutes   Medication Adjustments/Labs and Tests Ordered: Current medicines are reviewed at length with the patient today.  Concerns regarding medicines are outlined above.   Tests Ordered: Orders Placed This Encounter  Procedures   MYOCARDIAL PERFUSION IMAGING    Medication Changes: No orders of the defined types were placed in this encounter.   Disposition:  Follow up in 6 month(s). He will be called with the results of his stress test.   Signed, [name] Chandler, MD  10/28/2018 4:00 PM    [place]

## 2018-11-01 ENCOUNTER — Telehealth (HOSPITAL_COMMUNITY): Payer: Self-pay | Admitting: *Deleted

## 2018-11-01 NOTE — Telephone Encounter (Signed)
Patient given detailed instructions per Myocardial Perfusion Study Information Sheet for the test on 11/04/18 at 10:45. Patient notified to arrive 15 minutes early and that it is imperative to arrive on time for appointment to keep from having the test rescheduled.  If you need to cancel or reschedule your appointment, please call the office within 24 hours of your appointment. . Patient verbalized understanding.[name] [name]

## 2018-11-04 ENCOUNTER — Encounter (INDEPENDENT_AMBULATORY_CARE_PROVIDER_SITE_OTHER): Payer: Self-pay

## 2018-11-04 ENCOUNTER — Other Ambulatory Visit: Payer: Self-pay

## 2018-11-04 ENCOUNTER — Ambulatory Visit (HOSPITAL_COMMUNITY): Payer: Medicare Other | Attending: Cardiovascular Disease

## 2018-11-04 DIAGNOSIS — R0600 Dyspnea, unspecified: Secondary | ICD-10-CM | POA: Insufficient documentation

## 2018-11-04 DIAGNOSIS — Z01810 Encounter for preprocedural cardiovascular examination: Secondary | ICD-10-CM | POA: Insufficient documentation

## 2018-11-04 LAB — MYOCARDIAL PERFUSION IMAGING
LV dias vol: 71 mL (ref 62–150)
LV sys vol: 32 mL
Peak HR: 88 {beats}/min
Rest HR: 68 {beats}/min
SDS: 1
SRS: 0
SSS: 1
TID: 1.08

## 2018-11-04 MED ORDER — TECHNETIUM TC 99M TETROFOSMIN IV KIT
9.7000 | PACK | Freq: Once | INTRAVENOUS | Status: AC | PRN
  Administered 2018-11-04: 9.7 via INTRAVENOUS
  Filled 2018-11-04: qty 10

## 2018-11-04 MED ORDER — REGADENOSON 0.4 MG/5ML IV SOLN
0.4000 mg | Freq: Once | INTRAVENOUS | Status: AC
  Administered 2018-11-04: 0.4 mg via INTRAVENOUS

## 2018-11-04 MED ORDER — TECHNETIUM TC 99M TETROFOSMIN IV KIT
32.3000 | PACK | Freq: Once | INTRAVENOUS | Status: AC | PRN
  Administered 2018-11-04: 32.3 via INTRAVENOUS
  Filled 2018-11-04: qty 33

## 2018-11-06 ENCOUNTER — Encounter: Payer: Medicare Other | Admitting: Cardiothoracic Surgery

## 2018-11-06 ENCOUNTER — Encounter: Payer: Self-pay | Admitting: *Deleted

## 2018-11-06 ENCOUNTER — Other Ambulatory Visit: Payer: Self-pay

## 2018-11-06 ENCOUNTER — Ambulatory Visit: Payer: Medicare Other | Admitting: Cardiothoracic Surgery

## 2018-11-06 ENCOUNTER — Other Ambulatory Visit: Payer: Self-pay | Admitting: *Deleted

## 2018-11-06 ENCOUNTER — Encounter: Payer: Self-pay | Admitting: Cardiothoracic Surgery

## 2018-11-06 VITALS — BP 120/81 | HR 70 | Temp 98.7°F | Resp 16 | Ht 71.0 in | Wt 173.5 lb

## 2018-11-06 DIAGNOSIS — C160 Malignant neoplasm of cardia: Secondary | ICD-10-CM

## 2018-11-06 DIAGNOSIS — C159 Malignant neoplasm of esophagus, unspecified: Secondary | ICD-10-CM

## 2018-11-06 NOTE — Progress Notes (Signed)
[address]       [place] [address]             [phone]                    Jefferey Barbara Gem Medical Record #[id number] Date of Birth: [birth year]  Referring: Ladell Pier, MD Primary Care: Binnie Rail, MD Primary Cardiologist: Lauree Chandler, MD  Chief Complaint:    Chief Complaint  Patient presents with   Follow-up    after cardiac clearance with stress test 11/04/18    History of Present Illness:    [name] [name] 68 y.o. 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  The patient's disease first came to medical attention when he presented to his primary care doctor with new anemia, repeat labs confirmed anemia.  Patient denies any known observed blood in his stool.  He denies any loss of weight, he has noted some very mild discomfort with swallowing, but not to the extent of interfering with his diet   Colonoscopy and upper GI endoscopy were performed.  Upper GI endoscopy showed  A large, submucosal and ulcerating mass with bleeding and stigmata of recent bleeding was found in the distal esophagus, at the gastroesophageal junction and in the cardia, 38 cm from the incisors.  CT and PET scans have been done. 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  He notes early in his career in the 1980s he did do some work at the power plant that could have exposed him to asbestos.  Patient has been a smoker for proximately half a pack a day for 50 years. He denies any previous history of reflux  ? Radiation beginning 08/21/2018 ? Cycle 1 weekly Taxol/carboplatin 08/21/2018 ? Cycle 2 weekly Taxol/carboplatin 08/27/2018 ? Cycle 3 weekly Taxol/carboplatin 09/03/2018 ? Cycle 4 weekly Taxol/carboplatin 09/10/2018  Cycle 5 weekly Taxol/carboplatin 09/17/2018  Patient notes  that his chemotherapy  was completed March 31, last Radiation was April 7.  He has not been smoking for the last 3 months, has been doing reasonably well with his weight maintaining 150 to 154  pounds.  He has had vague epigastric discomfort and question of shortness of breath with increased activity, he notes this is not related to eating.   Since last seen he was seen for cardiology evaluation and a cardiac stress test was performed and he was cleared from a cardiac standpoint for surgery.  Current Activity/ Functional Status:  Patient is independent with mobility/ambulation, transfers, ADL's, IADL's.   Zubrod Score: At the time of surgery this patients most appropriate activity status/level should be described as: [x]     0    Normal activity, no symptoms []     1    Restricted in physical strenuous activity but ambulatory, able to do out light work []     2    Ambulatory and capable of self care, unable to do work activities, up and about               >50 % of waking hours                              []     3  Only limited self care, in bed greater than 50% of waking hours []     4    Completely disabled, no self care, confined to bed or chair []     5    Moribund   Past Medical History:  Diagnosis Date   Adenocarcinoma of gastroesophageal junction ([place]) 07/29/2018   Anemia    Diverticulosis    Hyperlipidemia    Hypertension    Tobacco abuse     Past Surgical History:  Procedure Laterality Date   COLONOSCOPY     WISDOM TOOTH EXTRACTION      Family History  Problem Relation Age of Onset   Cancer Mother        bone cancer   Heart attack Father 45   Cancer Sister        uterine cancer   Heart attack Paternal Uncle        X2, both < 30   COPD Neg Hx    Diabetes Neg Hx    Stroke Neg Hx    Colon cancer Neg Hx    Patient's mother died in her 36s of "bone cancer she had one sister died of cancer of unknown cause his father died at age 77 grandfather died  at age 64 and one brother died at age 17 all of sudden death presumed to be a myocardial infarction  Social History   Tobacco Use  Smoking Status Current Every Day Smoker   Packs/day: 0.25   Years: 50.00   Pack years: 12.50   Types: Cigarettes  Smokeless Tobacco Never Used  Tobacco Comment   06/02/14 2-3 pp week    Social History   Substance and Sexual Activity  Alcohol Use Yes   Alcohol/week: 2.0 standard drinks   Types: 2 Glasses of wine per week   Comment:  occasionally, not daily     No Known Allergies  Current Outpatient Medications  Medication Sig Dispense Refill   alum & mag hydroxide-simeth (MAALOX/MYLANTA) 200-200-20 MG/5ML suspension Take 15 mLs by mouth every 6 (six) hours as needed for indigestion or heartburn.     lisinopril-hydrochlorothiazide (ZESTORETIC) 20-25 MG tablet Take 1 tablet by mouth daily.     No current facility-administered medications for this visit.    Facility-Administered Medications Ordered in Other Visits  Medication Dose Route Frequency Provider Last Rate Last Dose   alum & mag hydroxide-simeth (MAALOX/MYLANTA) 200-200-20 MG/5ML suspension 30 mL  30 mL Oral Once Harle Stanford., PA-C       And   lidocaine (XYLOCAINE) 2 % viscous mouth solution 15 mL  15 mL Oral Once Harle Stanford., PA-C        Pertinent items are noted in HPI.   Review of Systems:     Cardiac Review of Systems: [Y] = yes  or   [ N ] = no   Chest Pain [ N]  Resting SOB [ N] Exertional SOB  [MILD ]  Orthopnea [  ]   Pedal Edema Aqua.Slicker ]    Palpitations Aqua.Slicker  ] Syncope  [ N]   Presyncope Aqua.Slicker  ]   General Review of Systems: [Y] = yes [  ]=no Constitional: recent weight change [none  ];  Wt loss over the last 3 months [   ] anorexia [  ]; fatigue [  ]; nausea [  ]; night sweats [  ]; fever [  ]; or chills [  ];           Eye :  blurred vision [  ]; diplopia [   ]; vision changes [  ];  Amaurosis fugax[  ]; Resp: cough [  ];  wheezing[  ];  hemoptysis[  ]; shortness of  breath[  ]; paroxysmal nocturnal dyspnea[  ]; dyspnea on exertion[  ]; or orthopnea[  ];  GI:  gallstones[  ], vomiting[  ];  dysphagia[  ]; melena[  ];  hematochezia [  ]; heartburn[  ];   Hx of  Colonoscopy[  ]; GU: kidney stones [  ]; hematuria[  ];   dysuria [  ];  nocturia[  ];  history of     obstruction [  ]; urinary frequency [  ]             Skin: rash, swelling[  ];, hair loss[  ];  peripheral edema[  ];  or itching[  ]; Musculosketetal: myalgias[  ];  joint swelling[  ];  joint erythema[  ];  joint pain[  ];  back pain[  ];  Heme/Lymph: bruising[  ];  bleeding[  ];  anemia[  ];  Neuro: TIA[  ];  headaches[  ];  stroke[  ];  vertigo[  ];  seizures[  ];   paresthesias[  ];  difficulty walking[  ];  Psych:depression[  ]; anxiety[  ];  Endocrine: diabetes[  ];  thyroid dysfunction[  ];  Immunizations: Flu up to date Jazmín.Cullens ]; Pneumococcal up to date [Y ];  Other:     PHYSICAL EXAMINATION: BP 120/81 (BP Location: Right Arm, Patient Position: Sitting, Cuff Size: Normal)    Pulse 70    Temp 98.7 F (37.1 C) (Oral)    Resp 16    Ht 5\' 11"  (1.803 m)    Wt 173 lb 8 oz (78.7 kg)    SpO2 98% Comment: ON RA   BMI 24.20 kg/m  General appearance: alert, cooperative and no distress Head: Normocephalic, without obvious abnormality, atraumatic Neck: no adenopathy, no carotid bruit, no JVD, supple, symmetrical, trachea midline and thyroid not enlarged, symmetric, no tenderness/mass/nodules Lymph nodes: Cervical, supraclavicular, and axillary nodes normal. Resp: clear to auscultation bilaterally Back: symmetric, no curvature. ROM normal. No CVA tenderness. Cardio: regular rate and rhythm, S1, S2 normal, no murmur, click, rub or gallop GI: soft, non-tender; bowel sounds normal; no masses,  no organomegaly Extremities: extremities normal, atraumatic, no cyanosis or edema and Homans sign is negative, no sign of DVT Neurologic: Grossly normal  Diagnostic Studies & Laboratory data:     Recent  Radiology Findings:  Ct Chest W Contrast/ Ct Abdomen W Contrast   Result Date: 10/24/2018 CLINICAL DATA:  Esophageal cancer restaging EXAM: CT CHEST AND ABDOMEN WITH CONTRAST TECHNIQUE: Multidetector CT imaging of the chest and abdomen was performed following the standard protocol during bolus administration of intravenous contrast. CONTRAST:  186mL ISOVUE-300 IOPAMIDOL (ISOVUE-300) INJECTION 61% COMPARISON:  Multiple exams, including PET-CT from 08/09/2018 FINDINGS: CT CHEST FINDINGS Cardiovascular: Minimal atherosclerotic calcification of the aortic arch. Density in the left anterior descending coronary artery potentially atherosclerotic calcification or a stent. Mediastinum/Nodes: Small mediastinal and axillary lymph nodes are not pathologically enlarged and are similar to the prior exam. Continued circumferential distal esophageal wall thickening extending into the stomach. Lungs/Pleura: Unremarkable Musculoskeletal: Mild thoracic spondylosis. CT ABDOMEN FINDINGS Hepatobiliary: The hypodense lesions in the left hepatic lobe are stable and probably cysts. No new or worrisome enhancing hepatic lesion. Gallbladder unremarkable. No biliary dilatation. Pancreas: Unremarkable Spleen: Unremarkable Adrenals/Urinary Tract: Stable appearance of bilateral adrenal adenomas. The  kidneys appear unremarkable. Stomach/Bowel: The gastric cardia portion of the gastroesophageal mass is less striking than on the 08/09/2018 exam and less striking than on 08/01/2018. Adjacent gastrohepatic ligament adenopathy is still present with clustered nodes measuring 1.2 cm in short axis, previously the same on 08/01/2018. Vascular/Lymphatic: As noted above there are continued clustered gastrohepatic ligament lymph nodes. An aortocaval node measures 0.8 cm in short axis on image 76/2, formerly the same, and not formerly hypermetabolic. Other: No supplemental non-categorized findings. Musculoskeletal: Mild right foraminal impingement at L4-5  due to spurring. IMPRESSION: 1. From a morphologic standpoint, the gastric cardia portion of the gastroesophageal mass is less striking than on the prior exam but the distal esophageal wall continues to appear thickened. The clustered gastrohepatic little lymph nodes which were previously mildly abnormally hypermetabolic have not significantly changed in size/appearance. 2. Other imaging findings of potential clinical significance: Aortic Atherosclerosis (ICD10-I70.0). Coronary atherosclerosis. Left hepatic lobe cysts. Bilateral adrenal adenomas. Mild right foraminal impingement at L4-5. Electronically Signed   By: Van Clines M.D.   On: 10/24/2018 12:18   I have independently reviewed the above radiology studies  and reviewed the findings with the patient.    Ct Chest W ContrastCt Abdomen Pelvis W Contrast  Result Date: 08/01/2018 CLINICAL DATA:  Primary adenocarcinoma of the esophagogastric junction. EXAM: CT CHEST, ABDOMEN, AND PELVIS WITH CONTRAST TECHNIQUE: Multidetector CT imaging of the chest, abdomen and pelvis was performed following the standard protocol during bolus administration of intravenous contrast. CONTRAST:  192mL ISOVUE-300 IOPAMIDOL (ISOVUE-300) INJECTION 61% COMPARISON:  No comparison studies available. FINDINGS: CT CHEST FINDINGS Cardiovascular: The heart size is normal. No substantial pericardial effusion. Coronary artery calcification is evident. Atherosclerotic calcification is noted in the wall of the thoracic aorta. Mediastinum/Nodes: No mediastinal lymphadenopathy. There is no hilar lymphadenopathy. Mild wall thickening noted distal esophagus extending into the esophagogastric junction. There is no axillary lymphadenopathy. Lungs/Pleura: The central tracheobronchial airways are patent. No suspicious pulmonary nodule or mass. No focal airspace consolidation. No pulmonary edema or pleural effusion. Musculoskeletal: No worrisome lytic or sclerotic osseous abnormality. CT  ABDOMEN PELVIS FINDINGS Hepatobiliary: 15 mm water density lesion in the medial segment left liver is probably a cyst. 7 mm low-density lesion in the dome of the liver (45/2) is too small to characterize. No overtly suspicious or enhancing liver lesion evident. There is no evidence for gallstones, gallbladder wall thickening, or pericholecystic fluid. No intrahepatic or extrahepatic biliary dilation. Pancreas: No focal mass lesion. No dilatation of the main duct. No intraparenchymal cyst. No peripancreatic edema. Spleen: No splenomegaly. No focal mass lesion. Adrenals/Urinary Tract: Thickening of the adrenal glands evident bilaterally, left greater than right. Dominant nodular component in the left adrenal gland measures 2.8 cm. Relative washout for this dominant nodular component in the left adrenal gland is 56%, most suggestive of adenoma. Kidneys unremarkable. No evidence for hydroureter. The urinary bladder appears normal for the degree of distention. Stomach/Bowel: Wall thickening is noted in the esophagogastric junction/cardia. Stomach otherwise unremarkable. Duodenum is normally positioned as is the ligament of Treitz. No small bowel wall thickening. No small bowel dilatation. The terminal ileum is normal. The appendix is normal. No gross colonic mass. No colonic wall thickening. Vascular/Lymphatic: There is abdominal aortic atherosclerosis without aneurysm. 2.8 x 1.3 cm node versus nodal conglomeration identified in the gastrohepatic ligament (53/2). Small 7 x 11 mm perigastric node visible on 51/2. No hepato duodenal ligament lymphadenopathy. Small para-aortic retroperitoneal nodes evident without retroperitoneal lymphadenopathy. Reproductive: Prostate gland is enlarged. Other: No  intraperitoneal free fluid. Musculoskeletal: Bilateral groin hernias contain only fat. Tiny sclerotic focus right ischial tuberosity, likely a bone island. No worrisome lytic or sclerotic osseous abnormality. Degenerative changes  noted diffusely in the lumbar spine. IMPRESSION: 1. Circumferential wall thickening noted in the distal esophagus extending into the esophagogastric junction and gastric cardia compatible with patient's known neoplasm. 2. 2.8 x 1.3 cm collection of small nodes versus single irregular enlarged lymph node in the gastrohepatic ligament concerning for metastatic disease. There is an adjacent 7 x 11 mm perigastric node, also concerning. 3. 15 mm lesion in the medial segment left liver has imaging features most suggestive of a simple cyst. 7 mm low-density lesion in the dome of the liver, too small to characterize and attention on follow-up recommended. 4. Bilateral nodular thickening of the adrenal glands with imaging features most suggestive of adenomatous enlargement. 5.  Aortic Atherosclerois (ICD10-170.0) Electronically Signed   By: Misty Stanley M.D.   On: 08/01/2018 11:41   Nm Pet Image Initial (pi) Skull Base To Thigh  Result Date: 08/09/2018 CLINICAL DATA:  Initial treatment strategy for esophageal cancer. EXAM: NUCLEAR MEDICINE PET SKULL BASE TO THIGH TECHNIQUE: 8.4 mCi F-18 FDG was injected intravenously. Full-ring PET imaging was performed from the skull base to thigh after the radiotracer. CT data was obtained and used for attenuation correction and anatomic localization. Fasting blood glucose: 103 mg/dl COMPARISON:  CT scan 08/01/2018 FINDINGS: Mediastinal blood pool activity: SUV max 2.6 NECK: No significant abnormal hypermetabolic activity in this region. Incidental CT findings: Complete opacification of the left frontal sinus and multiple left anterior ethmoid air cells, which could reflect chronic or acute sinusitis. Subtotal opacification left maxillary sinus from chronic sinusitis. Suspected upper left nasal mucosal polyps. Bilateral common carotid artery atherosclerotic calcification. CHEST: The distal esophageal component of the mass spanning the gastroesophageal junction has a maximum SUV of  9.0. An AP window lymph node measuring 0.8 cm in short axis on image 66/4 has a maximum SUV of 1.9. A left axillary node with fatty hilum measuring 1.1 cm in short axis on image 61/4 has a maximum SUV of 1.8. Incidental CT findings: Atherosclerotic calcification of the aortic arch and left anterior descending coronary artery. Borderline cardiomegaly. ABDOMEN/PELVIS: The gastric portion of the gastroesophageal mass primarily involves the gastric cardia and has a maximum SUV of 9.6. Adjacent clustered lymph nodes in the gastrohepatic ligament are observed individually measuring up to 1.0 cm in short axis on image 107/4, and collectively with a maximum SUV of 3.2. The larger of the hypodense liver lesions is in the lateral segment left hepatic lobe and is photopenic, favoring a cyst. The smaller lesions are technically nonspecific due to small size, but demonstrate no appreciable hypermetabolic activity. 1.5 by 2.5 cm right adrenal adenoma. Thickened low-density left adrenal gland compatible with adenoma measuring about 4.4 by 1.9 cm. Neither adrenal gland is overtly hypermetabolic. Incidental CT findings: Aortoiliac atherosclerotic vascular disease. Prostatomegaly. SKELETON: No significant abnormal hypermetabolic activity in this region. Incidental CT findings: none IMPRESSION: 1. Hypermetabolic mass spanning the gastroesophageal junction. The distal esophageal component has a maximum SUV of 9.0 and gastric cardia component has a maximum SUV of 9.6. Clustered gastrohepatic ligament lymph nodes are likely involved, with a maximum SUV of 3.2 which is mildly above the blood pool level. No compelling findings of hepatic metastatic disease at this time. 2. Other imaging findings of potential clinical significance: Complete opacification of the left frontal sinus and multiple ethmoid air cells which could be  from acute or chronic sinusitis. Subtotal opacification of the left maxillary sinus from chronic sinusitis. Bilateral  adrenal adenomas. Aortic Atherosclerosis (ICD10-I70.0). Coronary atherosclerosis. Prostatomegaly. Electronically Signed   By: Van Clines M.D.   On: 08/09/2018 09:08     I have independently reviewed the above radiology studies  and reviewed the findings with the patient.   Recent Lab Findings: Lab Results  Component Value Date   WBC 4.2 09/17/2018   HGB 13.0 09/17/2018   HCT 40.1 09/17/2018   PLT 188 09/17/2018   GLUCOSE 101 (H) 09/17/2018   CHOL 161 05/06/2018   TRIG 134.0 05/06/2018   HDL 54.20 05/06/2018   LDLDIRECT 131.7 12/27/2009   LDLCALC 80 05/06/2018   ALT 16 09/17/2018   AST 16 09/17/2018   NA 140 09/17/2018   K 4.3 09/17/2018   CL 109 09/17/2018   CREATININE 0.79 09/17/2018   BUN 14 09/17/2018   CO2 22 09/17/2018   TSH 1.24 05/06/2018   INR 1.03 07/05/2018   HGBA1C 5.4 05/06/2018   ENDO:07/25/2018 A large, submucosal and ulcerating mass with bleeding and stigmata of recent bleeding was found in the distal esophagus, at the gastroesophageal junction and in the cardia, 38 cm from the incisors. The mass was non-obstructing. Biopsies were taken with a cold forceps for histology. Verification of patient identification for the specimen was done. Estimated blood loss was minimal. Findings: - Patchy mucosal changes characterized by nodularity were found in the distal esophagus. Biopsies were taken with a cold forceps for histology. Verification of patient identification for the specimen was done. Estimated blood loss was minimal. - The exam was otherwise without abnormality. Add'l Images: Upper Gastrointestinal Tract Lower Third  Path: Diagnosis 1. Esophagogastric junction, biopsy, mass - ADENOCARCINOMA, SEE NOTE. 2. Esophagus, biopsy, distal esophagus nodular - ADENOCARCINOMA, SEE NOTE. 3. Colon, biopsy, cecum and ascending, polyp (2) - TUBULAR ADENOMA(S). - NEGATIVE FOR HIGH GRADE DYSPLASIA OR MALIGNANCY. Diagnosis Note 1. 1, 2. Dr Lyndon Code has reviewed  this case and concurs with the above interpretation. Dr [name] was notified on 07/26/2018. (NK:ecj 07/26/2018) Jaquita Folds MD  Cardiac stress test: Study Highlights     The left ventricular ejection fraction is normal (55-65%).  Nuclear stress EF: 55%.  There was no ST segment deviation noted during stress.  The study is normal.  This is a low risk study.   Normal resting and stress perfusion. No ischemia or infarction EF 55% some thinning of inferior wall thought to be from diaphragm     Assessment / Plan:   #1 advanced stage adenocarcinoma involving the distal third of the esophagus and cardia of the stomach, GE junction, Siewert-Stein Type II  Likely clinicial  stage III the patient has completed a course of preoperative radiation and chemotherapy which she is tolerated well.  CT scan shows evidence of response, no evidence of  metastatic disease.  With the patient's history of smoking, evidence of coronary calcification on CT scan, and vague symptoms of chest discomfort with exertion the patient  Was referred to cardiology ,  preoperative clearance by cardiology done with stress test     I discussed with the patient and his wife in detail the risks options of surgical resection esophageal cancer.  Patient is willing to proceed and we have arranged for surgery on June 8.  He is aware of.BOWELPREP And to stop his lisinopril 36 hours prior to surgery.   I had a detailed discussion with [name] [name] regarding the magnitude of the surgical esophagectomy  procedure as well as the risks, the expected benefits, and alternatives.  I quoted [name] [name] 5% perioperative mortality rate and a complication rate as high as 40%.  We specifically discussed complications, which include, but were not limited to: recurrent nerve injury with possible permanent hoarseness, anastomotic leak, airway and great vessel injury, conduit ischemia, thoracic duct leak, the inability to complete the operation  via a transhiatal approach requiring a right thoracotomy,  bleeding, need for blood transfusion and the potential need for ventilator support.  [name] [name] has had questions answered is well informed and willing to proceed.  [name] MD      [address] [place] [address] Office [phone]   Beeper [phone]  11/06/2018 11:39 AM

## 2018-11-07 NOTE — Progress Notes (Signed)
  Radiation Oncology         [phone]) [phone] ________________________________  Name: [name] [name] MRN: [id number]  Date: 09/27/2018  DOB: 1950-12-11  End of Treatment Note  Diagnosis:   68 y.o. male with Adenocarcinoma of the GE junction    Indication for treatment:  Curative       Radiation treatment dates:   08/21/2018 - 09/27/2018  Site/dose:   The esophagus received 45 Gy in 25 fractions initially. A boost was then given to 5.4 Gy to yield 50.4 Gy to the high dose target, utilizing a sequential boost technique.  Beams/energy:   IMRT / 6X Photon  Narrative: The patient tolerated radiation treatment relatively well.   He did develop esophagitis over his course of treatment for which he was prescribed Carafate. He also noted increased fatigue.  Plan: The patient has completed radiation treatment. We discussed that his esophagitis should improve over the next two to three weeks. He is being set up for a CT scan with follow-up to discuss this with cardiothoracic surgery. The patient will return to radiation oncology clinic for routine followup in one month. I advised them to call or return sooner if they have any questions or concerns related to their recovery or treatment.  ------------------------------------------------  Jodelle [place], MD, PhD  This document serves as a record of services personally performed by Kyung Rudd, MD. It was created on his behalf by Rae Lips, a trained medical scribe. The creation of this record is based on the scribe's personal observations and the provider's statements to them. This document has been checked and approved by the attending provider.

## 2018-11-21 ENCOUNTER — Other Ambulatory Visit (HOSPITAL_COMMUNITY): Payer: Medicare Other

## 2018-11-21 NOTE — Progress Notes (Signed)
[id number] [place], [place] [address] [address] Phone: [phone] Fax: [phone]      Your procedure is scheduled on June 8  Report to [facility] Main Entrance "A" at 0530 A.M., and check in at the Admitting office.  Call this number if you have problems the morning of surgery:  [phone]  Call [phone] if you have any questions prior to your surgery date Monday-Friday 8am-4pm    Remember:  Do not eat or drink after midnight.    There are no medications you need to take the morning of surgery  7 days prior to surgery STOP taking any Aspirin (unless otherwise instructed by your surgeon), Aleve, Naproxen, Ibuprofen, Motrin, Advil, Goody's, BC's, all herbal medications, fish oil, and all vitamins.    The Morning of Surgery  Do not wear jewelry, make-up or nail polish.  Do not wear lotions, powders, or perfumes/colognes, or deodorant  Do not shave 48 hours prior to surgery.  Men may shave face and neck.  Do not bring valuables to the hospital.  [facility] is not responsible for any belongings or valuables.  If you are a smoker, DO NOT Smoke 24 hours prior to surgery IF you wear a CPAP at night please bring your mask, tubing, and machine the morning of surgery   Remember that you must have someone to transport you home after your surgery, and remain with you for 24 hours if you are discharged the same day.   Contacts, glasses, hearing aids, dentures or bridgework may not be worn into surgery.    Leave your suitcase in the car.  After surgery it may be brought to your room.  For patients admitted to the hospital, discharge time will be determined by your treatment team.  Patients discharged the day of surgery will not be allowed to drive home.    Special instructions:   [facility]- Preparing For Surgery  Before surgery, you can play an important role.  Because skin is not sterile, your skin needs to be as free of germs as possible. You can reduce the number of germs on your skin by washing with CHG (chlorahexidine gluconate) Soap before surgery.  CHG is an antiseptic cleaner which kills germs and bonds with the skin to continue killing germs even after washing.    Oral Hygiene is also important to reduce your risk of infection.  Remember - BRUSH YOUR TEETH THE MORNING OF SURGERY WITH YOUR REGULAR TOOTHPASTE  Please do not use if you have an allergy to CHG or antibacterial soaps. If your skin becomes reddened/irritated stop using the CHG.  Do not shave (including legs and underarms) for at least 48 hours prior to first CHG shower. It is OK to shave your face.  Please follow these instructions carefully.   1. Shower the NIGHT BEFORE SURGERY and the MORNING OF SURGERY with CHG Soap.   2. If you chose to wash your hair, wash your hair first as usual with your normal shampoo.  3. After you shampoo, rinse your hair and body thoroughly to remove the shampoo.  4. Use CHG as you would any other liquid soap. You can apply CHG directly to the skin and wash gently with a scrungie or a clean washcloth.   5. Apply the CHG Soap to your body ONLY FROM THE NECK DOWN.  Do not use on open wounds or open sores. Avoid contact  with your eyes, ears, mouth and genitals (private parts). Wash Face and genitals (private parts)  with your normal soap.   6. Wash thoroughly, paying special attention to the area where your surgery will be performed.  7. Thoroughly rinse your body with warm water from the neck down.  8. DO NOT shower/wash with your normal soap after using and rinsing off the CHG Soap.  9. Pat yourself dry with a CLEAN TOWEL.  10. Wear CLEAN PAJAMAS to bed the night before surgery, wear comfortable clothes the morning of surgery  11. Place CLEAN SHEETS on your bed the night of your first shower and DO NOT SLEEP WITH PETS.    Day of Surgery:  Do  not apply any deodorants/lotions.  Please wear clean clothes to the hospital/surgery center.   Remember to brush your teeth WITH YOUR REGULAR TOOTHPASTE.   Please read over the following fact sheets that you were given.

## 2018-11-22 ENCOUNTER — Encounter (HOSPITAL_COMMUNITY): Payer: Self-pay

## 2018-11-22 ENCOUNTER — Encounter (HOSPITAL_COMMUNITY)
Admission: RE | Admit: 2018-11-22 | Discharge: 2018-11-22 | Disposition: A | Discharge status: Home / Self Care | Payer: Medicare Other | Source: Ambulatory Visit | Attending: Cardiothoracic Surgery | Admitting: Cardiothoracic Surgery

## 2018-11-22 ENCOUNTER — Other Ambulatory Visit (HOSPITAL_COMMUNITY)
Admission: RE | Admit: 2018-11-22 | Discharge: 2018-11-22 | Disposition: A | Discharge status: Home / Self Care | Payer: Medicare Other | Source: Ambulatory Visit | Attending: Cardiothoracic Surgery | Admitting: Cardiothoracic Surgery

## 2018-11-22 ENCOUNTER — Ambulatory Visit (HOSPITAL_COMMUNITY)
Admission: RE | Admit: 2018-11-22 | Discharge: 2018-11-22 | Disposition: A | Discharge status: Home / Self Care | Payer: Medicare Other | Source: Ambulatory Visit | Attending: Cardiothoracic Surgery | Admitting: Cardiothoracic Surgery

## 2018-11-22 ENCOUNTER — Other Ambulatory Visit: Payer: Self-pay

## 2018-11-22 ENCOUNTER — Other Ambulatory Visit (HOSPITAL_COMMUNITY): Payer: Medicare Other

## 2018-11-22 DIAGNOSIS — Z01818 Encounter for other preprocedural examination: Secondary | ICD-10-CM | POA: Insufficient documentation

## 2018-11-22 DIAGNOSIS — I10 Essential (primary) hypertension: Secondary | ICD-10-CM | POA: Insufficient documentation

## 2018-11-22 DIAGNOSIS — Z1159 Encounter for screening for other viral diseases: Secondary | ICD-10-CM | POA: Insufficient documentation

## 2018-11-22 DIAGNOSIS — C159 Malignant neoplasm of esophagus, unspecified: Secondary | ICD-10-CM | POA: Insufficient documentation

## 2018-11-22 LAB — SURGICAL PCR SCREEN
MRSA, PCR: NEGATIVE
Staphylococcus aureus: NEGATIVE

## 2018-11-22 LAB — COMPREHENSIVE METABOLIC PANEL
ALT: 14 U/L (ref 0–44)
AST: 18 U/L (ref 15–41)
Albumin: 3.9 g/dL (ref 3.5–5.0)
Alkaline Phosphatase: 75 U/L (ref 38–126)
Anion gap: 14 (ref 5–15)
BUN: 22 mg/dL (ref 8–23)
CO2: 18 mmol/L — ABNORMAL LOW (ref 22–32)
Calcium: 9.6 mg/dL (ref 8.9–10.3)
Chloride: 101 mmol/L (ref 98–111)
Creatinine, Ser: 0.95 mg/dL (ref 0.61–1.24)
GFR calc Af Amer: >60 mL/min (ref >60)
GFR calc non Af Amer: >60 mL/min (ref >60)
Glucose, Bld: 96 mg/dL (ref 70–99)
Potassium: 4.5 mmol/L (ref 3.5–5.1)
Sodium: 133 mmol/L — ABNORMAL LOW (ref 135–145)
Total Bilirubin: 0.5 mg/dL (ref 0.3–1.2)
Total Protein: 6.7 g/dL (ref 6.5–8.1)

## 2018-11-22 LAB — BLOOD GAS, ARTERIAL
Acid-Base Excess: 0.7 mmol/L (ref 0.0–2.0)
Bicarbonate: 24.2 mmol/L (ref 20.0–28.0)
Drawn by: 470591
FIO2: 21
O2 Saturation: 95 %
Patient temperature: 98.6
pCO2 arterial: 34.4 mmHg (ref 32.0–48.0)
pH, Arterial: 7.461 — ABNORMAL HIGH (ref 7.350–7.450)
pO2, Arterial: 72.2 mmHg — ABNORMAL LOW (ref 83.0–108.0)

## 2018-11-22 LAB — CBC
HCT: 41.7 % (ref 39.0–52.0)
Hemoglobin: 14.3 g/dL (ref 13.0–17.0)
MCH: 30.6 pg (ref 26.0–34.0)
MCHC: 34.3 g/dL (ref 30.0–36.0)
MCV: 89.1 fL (ref 80.0–100.0)
Platelets: 349 10*3/uL (ref 150–400)
RBC: 4.68 MIL/uL (ref 4.22–5.81)
RDW: 13.9 % (ref 11.5–15.5)
WBC: 6.8 10*3/uL (ref 4.0–10.5)
nRBC: 0 % (ref 0.0–0.2)

## 2018-11-22 LAB — URINALYSIS, ROUTINE W REFLEX MICROSCOPIC
Bilirubin Urine: NEGATIVE
Glucose, UA: NEGATIVE mg/dL
Hgb urine dipstick: NEGATIVE
Ketones, ur: NEGATIVE mg/dL
Leukocytes,Ua: NEGATIVE
Nitrite: NEGATIVE
Protein, ur: NEGATIVE mg/dL
Specific Gravity, Urine: 1.023 (ref 1.005–1.030)
pH: 5 (ref 5.0–8.0)

## 2018-11-22 LAB — APTT: aPTT: 28 seconds (ref 24–36)

## 2018-11-22 LAB — SARS CORONAVIRUS 2 BY RT PCR (HOSPITAL ORDER, PERFORMED IN ~~LOC~~ HOSPITAL LAB): SARS Coronavirus 2: NEGATIVE

## 2018-11-22 LAB — PROTIME-INR
INR: 1.1 (ref 0.8–1.2)
Prothrombin Time: 13.7 seconds (ref 11.4–15.2)

## 2018-11-22 NOTE — Anesthesia Preprocedure Evaluation (Addendum)
Anesthesia Evaluation  Patient identified by MRN, date of birth, ID band Patient awake    Reviewed: Allergy & Precautions, NPO status , Patient's Chart, lab work & pertinent test results  Airway Mallampati: II  TM Distance: >3 FB Neck ROM: Full    Dental  (+) Teeth Intact, Dental Advisory Given   Pulmonary former smoker,    breath sounds clear to auscultation       Cardiovascular hypertension,  Rhythm:Regular Rate:Normal     Neuro/Psych    GI/Hepatic   Endo/Other    Renal/GU      Musculoskeletal   Abdominal   Peds  Hematology   Anesthesia Other Findings   Reproductive/Obstetrics                            Anesthesia Physical Anesthesia Plan  ASA: III  Anesthesia Plan: General   Post-op Pain Management:    Induction: Intravenous  PONV Risk Score and Plan: Ondansetron and Dexamethasone  Airway Management Planned:   Additional Equipment: Arterial line, CVP and Ultrasound Guidance Line Placement  Intra-op Plan:   Post-operative Plan: Post-operative intubation/ventilation  Informed Consent: I have reviewed the patients History and Physical, chart, labs and discussed the procedure including the risks, benefits and alternatives for the proposed anesthesia with the patient or authorized representative who has indicated his/her understanding and acceptance.     Dental advisory given  Plan Discussed with: Anesthesiologist and CRNA  Anesthesia Plan Comments: (Seen by Dr. Angelena Form for preop cardiovascular risk stratification 10/28/18. Per note "Risk factors for CAD include age, FH of CAD, smoking, HTN and hyperlipidemia. He has no symptoms worrisome for angina or CHF but given his risk factors for CAD, a nuclear stress test will be arranged prior to his major esophageal surgery in June 2020 to exclude ischemia." Nuclear stress 11/04/18 showed: Normal resting and stress perfusion. No  ischemia or infarction EF 55% some thinning of inferior wall thought to be from diaphragm.)      Anesthesia Quick Evaluation

## 2018-11-25 ENCOUNTER — Inpatient Hospital Stay (HOSPITAL_COMMUNITY): Payer: Medicare Other | Admitting: Anesthesiology

## 2018-11-25 ENCOUNTER — Inpatient Hospital Stay (HOSPITAL_COMMUNITY): Payer: Medicare Other

## 2018-11-25 ENCOUNTER — Other Ambulatory Visit: Payer: Self-pay

## 2018-11-25 ENCOUNTER — Encounter (HOSPITAL_COMMUNITY): Payer: Self-pay

## 2018-11-25 ENCOUNTER — Encounter (HOSPITAL_COMMUNITY)
Admission: RE | Disposition: A | Discharge status: Home / Self Care | Payer: Self-pay | Source: Home / Self Care | Attending: Cardiothoracic Surgery

## 2018-11-25 ENCOUNTER — Inpatient Hospital Stay (HOSPITAL_COMMUNITY)
Admission: RE | Admit: 2018-11-25 | Discharge: 2018-12-18 | DRG: 327 | Disposition: A | Discharge status: Home Health | Payer: Medicare Other | Attending: Cardiothoracic Surgery | Admitting: Cardiothoracic Surgery

## 2018-11-25 ENCOUNTER — Inpatient Hospital Stay (HOSPITAL_COMMUNITY): Payer: Medicare Other | Admitting: Physician Assistant

## 2018-11-25 DIAGNOSIS — D62 Acute posthemorrhagic anemia: Secondary | ICD-10-CM | POA: Diagnosis not present

## 2018-11-25 DIAGNOSIS — Z809 Family history of malignant neoplasm, unspecified: Secondary | ICD-10-CM | POA: Diagnosis not present

## 2018-11-25 DIAGNOSIS — I9789 Other postprocedural complications and disorders of the circulatory system, not elsewhere classified: Secondary | ICD-10-CM | POA: Diagnosis not present

## 2018-11-25 DIAGNOSIS — M545 Low back pain: Secondary | ICD-10-CM | POA: Diagnosis present

## 2018-11-25 DIAGNOSIS — R609 Edema, unspecified: Secondary | ICD-10-CM | POA: Diagnosis not present

## 2018-11-25 DIAGNOSIS — I82612 Acute embolism and thrombosis of superficial veins of left upper extremity: Secondary | ICD-10-CM | POA: Diagnosis not present

## 2018-11-25 DIAGNOSIS — R7303 Prediabetes: Secondary | ICD-10-CM | POA: Diagnosis present

## 2018-11-25 DIAGNOSIS — C160 Malignant neoplasm of cardia: Principal | ICD-10-CM | POA: Diagnosis present

## 2018-11-25 DIAGNOSIS — C155 Malignant neoplasm of lower third of esophagus: Secondary | ICD-10-CM

## 2018-11-25 DIAGNOSIS — Z6822 Body mass index (BMI) 22.0-22.9, adult: Secondary | ICD-10-CM | POA: Diagnosis not present

## 2018-11-25 DIAGNOSIS — Z87891 Personal history of nicotine dependence: Secondary | ICD-10-CM | POA: Diagnosis not present

## 2018-11-25 DIAGNOSIS — Z9049 Acquired absence of other specified parts of digestive tract: Secondary | ICD-10-CM

## 2018-11-25 DIAGNOSIS — I10 Essential (primary) hypertension: Secondary | ICD-10-CM | POA: Diagnosis present

## 2018-11-25 DIAGNOSIS — G4700 Insomnia, unspecified: Secondary | ICD-10-CM | POA: Diagnosis present

## 2018-11-25 DIAGNOSIS — E440 Moderate protein-calorie malnutrition: Secondary | ICD-10-CM | POA: Diagnosis present

## 2018-11-25 DIAGNOSIS — Z9889 Other specified postprocedural states: Secondary | ICD-10-CM

## 2018-11-25 DIAGNOSIS — Z9689 Presence of other specified functional implants: Secondary | ICD-10-CM

## 2018-11-25 DIAGNOSIS — R339 Retention of urine, unspecified: Secondary | ICD-10-CM | POA: Diagnosis not present

## 2018-11-25 DIAGNOSIS — E875 Hyperkalemia: Secondary | ICD-10-CM | POA: Diagnosis not present

## 2018-11-25 DIAGNOSIS — E785 Hyperlipidemia, unspecified: Secondary | ICD-10-CM | POA: Diagnosis present

## 2018-11-25 DIAGNOSIS — J939 Pneumothorax, unspecified: Secondary | ICD-10-CM | POA: Diagnosis not present

## 2018-11-25 DIAGNOSIS — Z09 Encounter for follow-up examination after completed treatment for conditions other than malignant neoplasm: Secondary | ICD-10-CM

## 2018-11-25 DIAGNOSIS — Z1159 Encounter for screening for other viral diseases: Secondary | ICD-10-CM

## 2018-11-25 DIAGNOSIS — C159 Malignant neoplasm of esophagus, unspecified: Secondary | ICD-10-CM | POA: Diagnosis present

## 2018-11-25 HISTORY — PX: COMPLETE ESOPHAGECTOMY: SHX5286

## 2018-11-25 HISTORY — PX: PYLOROPLASTY: SHX418

## 2018-11-25 HISTORY — PX: VIDEO BRONCHOSCOPY: SHX5072

## 2018-11-25 HISTORY — PX: JEJUNOSTOMY: SHX313

## 2018-11-25 LAB — CBC
HCT: 32.5 % — ABNORMAL LOW (ref 39.0–52.0)
Hemoglobin: 11.2 g/dL — ABNORMAL LOW (ref 13.0–17.0)
MCH: 30.2 pg (ref 26.0–34.0)
MCHC: 34.5 g/dL (ref 30.0–36.0)
MCV: 87.6 fL (ref 80.0–100.0)
Platelets: 173 10*3/uL (ref 150–400)
RBC: 3.71 MIL/uL — ABNORMAL LOW (ref 4.22–5.81)
RDW: 12.5 % (ref 11.5–15.5)
WBC: 7.9 10*3/uL (ref 4.0–10.5)
nRBC: 0 % (ref 0.0–0.2)

## 2018-11-25 LAB — POCT I-STAT 7, (LYTES, BLD GAS, ICA,H+H)
Acid-base deficit: 3 mmol/L — ABNORMAL HIGH (ref 0.0–2.0)
Acid-base deficit: 2 mmol/L (ref 0.0–2.0)
Acid-base deficit: 1 mmol/L (ref 0.0–2.0)
Acid-base deficit: 2 mmol/L (ref 0.0–2.0)
Acid-base deficit: 2 mmol/L (ref 0.0–2.0)
Acid-base deficit: 2 mmol/L (ref 0.0–2.0)
Bicarbonate: 21.8 mmol/L (ref 20.0–28.0)
Bicarbonate: 23.2 mmol/L (ref 20.0–28.0)
Bicarbonate: 23.8 mmol/L (ref 20.0–28.0)
Bicarbonate: 22.3 mmol/L (ref 20.0–28.0)
Bicarbonate: 22.1 mmol/L (ref 20.0–28.0)
Bicarbonate: 22.6 mmol/L (ref 20.0–28.0)
Calcium, Ion: 1.09 mmol/L — ABNORMAL LOW (ref 1.15–1.40)
Calcium, Ion: 1.18 mmol/L (ref 1.15–1.40)
Calcium, Ion: 1.19 mmol/L (ref 1.15–1.40)
Calcium, Ion: 1.21 mmol/L (ref 1.15–1.40)
Calcium, Ion: 1.24 mmol/L (ref 1.15–1.40)
Calcium, Ion: 1.21 mmol/L (ref 1.15–1.40)
HCT: 28 % — ABNORMAL LOW (ref 39.0–52.0)
HCT: 27 % — ABNORMAL LOW (ref 39.0–52.0)
HCT: 25 % — ABNORMAL LOW (ref 39.0–52.0)
HCT: 31 % — ABNORMAL LOW (ref 39.0–52.0)
HCT: 33 % — ABNORMAL LOW (ref 39.0–52.0)
HCT: 33 % — ABNORMAL LOW (ref 39.0–52.0)
Hemoglobin: 9.5 g/dL — ABNORMAL LOW (ref 13.0–17.0)
Hemoglobin: 9.2 g/dL — ABNORMAL LOW (ref 13.0–17.0)
Hemoglobin: 8.5 g/dL — ABNORMAL LOW (ref 13.0–17.0)
Hemoglobin: 10.5 g/dL — ABNORMAL LOW (ref 13.0–17.0)
Hemoglobin: 11.2 g/dL — ABNORMAL LOW (ref 13.0–17.0)
Hemoglobin: 11.2 g/dL — ABNORMAL LOW (ref 13.0–17.0)
O2 Saturation: 100 %
O2 Saturation: 100 %
O2 Saturation: 100 %
O2 Saturation: 100 %
O2 Saturation: 99 %
O2 Saturation: 94 %
Patient temperature: 34.9
Patient temperature: 35.8
Patient temperature: 35.9
Patient temperature: 97.8
Patient temperature: 97.6
Patient temperature: 97.6
Potassium: 3.9 mmol/L (ref 3.5–5.1)
Potassium: 4.5 mmol/L (ref 3.5–5.1)
Potassium: 4.6 mmol/L (ref 3.5–5.1)
Potassium: 4.9 mmol/L (ref 3.5–5.1)
Potassium: 4.4 mmol/L (ref 3.5–5.1)
Potassium: 4.8 mmol/L (ref 3.5–5.1)
Sodium: 136 mmol/L (ref 135–145)
Sodium: 134 mmol/L — ABNORMAL LOW (ref 135–145)
Sodium: 134 mmol/L — ABNORMAL LOW (ref 135–145)
Sodium: 134 mmol/L — ABNORMAL LOW (ref 135–145)
Sodium: 133 mmol/L — ABNORMAL LOW (ref 135–145)
Sodium: 134 mmol/L — ABNORMAL LOW (ref 135–145)
TCO2: 23 mmol/L (ref 22–32)
TCO2: 24 mmol/L (ref 22–32)
TCO2: 25 mmol/L (ref 22–32)
TCO2: 23 mmol/L (ref 22–32)
TCO2: 23 mmol/L (ref 22–32)
TCO2: 24 mmol/L (ref 22–32)
pCO2 arterial: 34.9 mmHg (ref 32.0–48.0)
pCO2 arterial: 37.9 mmHg (ref 32.0–48.0)
pCO2 arterial: 39.1 mmHg (ref 32.0–48.0)
pCO2 arterial: 36.1 mmHg (ref 32.0–48.0)
pCO2 arterial: 35 mmHg (ref 32.0–48.0)
pCO2 arterial: 34.6 mmHg (ref 32.0–48.0)
pH, Arterial: 7.395 (ref 7.350–7.450)
pH, Arterial: 7.39 (ref 7.350–7.450)
pH, Arterial: 7.388 (ref 7.350–7.450)
pH, Arterial: 7.396 (ref 7.350–7.450)
pH, Arterial: 7.407 (ref 7.350–7.450)
pH, Arterial: 7.421 (ref 7.350–7.450)
pO2, Arterial: 290 mmHg — ABNORMAL HIGH (ref 83.0–108.0)
pO2, Arterial: 197 mmHg — ABNORMAL HIGH (ref 83.0–108.0)
pO2, Arterial: 255 mmHg — ABNORMAL HIGH (ref 83.0–108.0)
pO2, Arterial: 176 mmHg — ABNORMAL HIGH (ref 83.0–108.0)
pO2, Arterial: 139 mmHg — ABNORMAL HIGH (ref 83.0–108.0)
pO2, Arterial: 67 mmHg — ABNORMAL LOW (ref 83.0–108.0)

## 2018-11-25 LAB — BASIC METABOLIC PANEL
Anion gap: 8 (ref 5–15)
BUN: 21 mg/dL (ref 8–23)
CO2: 22 mmol/L (ref 22–32)
Calcium: 8 mg/dL — ABNORMAL LOW (ref 8.9–10.3)
Chloride: 103 mmol/L (ref 98–111)
Creatinine, Ser: 1.1 mg/dL (ref 0.61–1.24)
GFR calc Af Amer: >60 mL/min (ref >60)
GFR calc non Af Amer: >60 mL/min (ref >60)
Glucose, Bld: 157 mg/dL — ABNORMAL HIGH (ref 70–99)
Potassium: 4.8 mmol/L (ref 3.5–5.1)
Sodium: 133 mmol/L — ABNORMAL LOW (ref 135–145)

## 2018-11-25 LAB — PREPARE RBC (CROSSMATCH)

## 2018-11-25 LAB — GLUCOSE, CAPILLARY
Glucose-Capillary: 142 mg/dL — ABNORMAL HIGH (ref 70–99)
Glucose-Capillary: 157 mg/dL — ABNORMAL HIGH (ref 70–99)
Glucose-Capillary: 164 mg/dL — ABNORMAL HIGH (ref 70–99)

## 2018-11-25 SURGERY — BRONCHOSCOPY, VIDEO-ASSISTED
Anesthesia: General | Site: Abdomen

## 2018-11-25 MED ORDER — SODIUM CHLORIDE 0.9 % IV SOLN
INTRAVENOUS | Status: DC | PRN
  Administered 2018-11-25: 20 ug/min via INTRAVENOUS

## 2018-11-25 MED ORDER — ORAL CARE MOUTH RINSE
15.0000 mL | Freq: Two times a day (BID) | OROMUCOSAL | Status: DC
  Administered 2018-11-26 – 2018-12-18 (×39): 15 mL via OROMUCOSAL

## 2018-11-25 MED ORDER — PROPOFOL 10 MG/ML IV BOLUS
INTRAVENOUS | Status: DC | PRN
  Administered 2018-11-25: 30 mg via INTRAVENOUS
  Administered 2018-11-25: 150 mg via INTRAVENOUS
  Administered 2018-11-25: 50 mg via INTRAVENOUS

## 2018-11-25 MED ORDER — SODIUM CHLORIDE 0.9 % IV SOLN
2.0000 g | Freq: Once | INTRAVENOUS | Status: DC
  Filled 2018-11-25: qty 2

## 2018-11-25 MED ORDER — SODIUM CHLORIDE 0.9% FLUSH: 10.0000 mL | INTRAVENOUS | Status: DC | PRN

## 2018-11-25 MED ORDER — PANTOPRAZOLE SODIUM 40 MG IV SOLR
40.0000 mg | Freq: Two times a day (BID) | INTRAVENOUS | Status: DC
  Administered 2018-11-25 – 2018-12-17 (×46): 40 mg via INTRAVENOUS
  Filled 2018-11-25 (×46): qty 40

## 2018-11-25 MED ORDER — CHLORHEXIDINE GLUCONATE CLOTH 2 % EX PADS
6.0000 | MEDICATED_PAD | Freq: Every day | CUTANEOUS | Status: DC
  Administered 2018-11-25 – 2018-11-26 (×2): 6 via TOPICAL

## 2018-11-25 MED ORDER — DEXTROSE 5 % IV SOLN
INTRAVENOUS | Status: DC | PRN
  Administered 2018-11-25 (×4): 2 g via INTRAVENOUS

## 2018-11-25 MED ORDER — DEXMEDETOMIDINE HCL IN NACL 200 MCG/50ML IV SOLN
0.0000 ug/kg/h | INTRAVENOUS | Status: DC
  Administered 2018-11-25: 0.7 ug/kg/h via INTRAVENOUS
  Filled 2018-11-25: qty 50

## 2018-11-25 MED ORDER — DEXTROSE-NACL 5-0.45 % IV SOLN
INTRAVENOUS | Status: DC
  Administered 2018-11-25 – 2018-11-29 (×12): via INTRAVENOUS

## 2018-11-25 MED ORDER — SUCCINYLCHOLINE CHLORIDE 200 MG/10ML IV SOSY
PREFILLED_SYRINGE | INTRAVENOUS | Status: DC | PRN
  Administered 2018-11-25: 120 mg via INTRAVENOUS

## 2018-11-25 MED ORDER — FENTANYL CITRATE (PF) 250 MCG/5ML IJ SOLN
INTRAMUSCULAR | Status: AC
  Filled 2018-11-25: qty 5

## 2018-11-25 MED ORDER — CHLORHEXIDINE GLUCONATE 0.12% ORAL RINSE (MEDLINE KIT)
15.0000 mL | Freq: Two times a day (BID) | OROMUCOSAL | Status: DC
  Administered 2018-11-25: 20:00:00 15 mL via OROMUCOSAL

## 2018-11-25 MED ORDER — SODIUM CHLORIDE 0.9 % IV SOLN
2.0000 g | INTRAVENOUS | Status: AC
  Filled 2018-11-25 (×2): qty 2

## 2018-11-25 MED ORDER — SODIUM CHLORIDE 0.9 % IV SOLN
2.0000 g | Freq: Four times a day (QID) | INTRAVENOUS | Status: AC
  Administered 2018-11-25 – 2018-11-26 (×3): 2 g via INTRAVENOUS
  Filled 2018-11-25 (×3): qty 2

## 2018-11-25 MED ORDER — SODIUM CHLORIDE 0.9 % IV SOLN
2.0000 g | INTRAVENOUS | Status: DC
  Filled 2018-11-25: qty 2

## 2018-11-25 MED ORDER — POTASSIUM CHLORIDE 10 MEQ/50ML IV SOLN
10.0000 meq | INTRAVENOUS | Status: DC | PRN
  Administered 2018-11-28 – 2018-11-30 (×5): 10 meq via INTRAVENOUS
  Filled 2018-11-25 (×5): qty 50

## 2018-11-25 MED ORDER — MORPHINE SULFATE (PF) 2 MG/ML IV SOLN
1.0000 mg | INTRAVENOUS | Status: DC | PRN
  Administered 2018-11-25: 1 mg via INTRAVENOUS
  Administered 2018-11-26: 4 mg via INTRAVENOUS
  Administered 2018-11-26 (×3): 2 mg via INTRAVENOUS
  Administered 2018-11-26: 1 mg via INTRAVENOUS
  Administered 2018-11-26: 4 mg via INTRAVENOUS
  Administered 2018-12-01 – 2018-12-02 (×5): 2 mg via INTRAVENOUS
  Administered 2018-12-03 (×2): 4 mg via INTRAVENOUS
  Administered 2018-12-04 – 2018-12-05 (×2): 2 mg via INTRAVENOUS
  Administered 2018-12-06 – 2018-12-07 (×2): 4 mg via INTRAVENOUS
  Administered 2018-12-07 – 2018-12-12 (×6): 2 mg via INTRAVENOUS
  Administered 2018-12-13 – 2018-12-15 (×3): 4 mg via INTRAVENOUS
  Administered 2018-12-16: 2 mg via INTRAVENOUS
  Administered 2018-12-17 – 2018-12-18 (×2): 4 mg via INTRAVENOUS
  Filled 2018-11-25: qty 2
  Filled 2018-11-25 (×6): qty 1
  Filled 2018-11-25: qty 2
  Filled 2018-11-25 (×4): qty 1
  Filled 2018-11-25 (×3): qty 2
  Filled 2018-11-25 (×2): qty 1
  Filled 2018-11-25: qty 2
  Filled 2018-11-25 (×4): qty 1
  Filled 2018-11-25 (×5): qty 2
  Filled 2018-11-25 (×4): qty 1

## 2018-11-25 MED ORDER — DEXAMETHASONE SODIUM PHOSPHATE 10 MG/ML IJ SOLN
INTRAMUSCULAR | Status: DC | PRN
  Administered 2018-11-25: 10 mg via INTRAVENOUS

## 2018-11-25 MED ORDER — LIDOCAINE 2% (20 MG/ML) 5 ML SYRINGE
INTRAMUSCULAR | Status: DC | PRN
  Administered 2018-11-25: 40 mg via INTRAVENOUS

## 2018-11-25 MED ORDER — INSULIN ASPART 100 UNIT/ML ~~LOC~~ SOLN
0.0000 [IU] | SUBCUTANEOUS | Status: DC
  Administered 2018-11-25: 20:00:00 4 [IU] via SUBCUTANEOUS
  Administered 2018-11-25 – 2018-12-09 (×23): 2 [IU] via SUBCUTANEOUS
  Administered 2018-12-10: 17:00:00 1 [IU] via SUBCUTANEOUS
  Administered 2018-12-10 – 2018-12-16 (×9): 2 [IU] via SUBCUTANEOUS
  Administered 2018-12-17: 17:00:00 4 [IU] via SUBCUTANEOUS
  Administered 2018-12-17 – 2018-12-18 (×2): 2 [IU] via SUBCUTANEOUS

## 2018-11-25 MED ORDER — ROCURONIUM BROMIDE 10 MG/ML (PF) SYRINGE
PREFILLED_SYRINGE | INTRAVENOUS | Status: AC
  Filled 2018-11-25: qty 10

## 2018-11-25 MED ORDER — MIDAZOLAM HCL 2 MG/2ML IJ SOLN
INTRAMUSCULAR | Status: AC
  Filled 2018-11-25: qty 2

## 2018-11-25 MED ORDER — MIDAZOLAM HCL 2 MG/2ML IJ SOLN
INTRAMUSCULAR | Status: AC
  Filled 2018-11-25: qty 4

## 2018-11-25 MED ORDER — SODIUM CHLORIDE 0.9 % IV SOLN: 10.0000 mL/h | Freq: Once | INTRAVENOUS | Status: DC

## 2018-11-25 MED ORDER — ONDANSETRON HCL 4 MG/2ML IJ SOLN
INTRAMUSCULAR | Status: DC | PRN
  Administered 2018-11-25: 4 mg via INTRAVENOUS

## 2018-11-25 MED ORDER — 0.9 % SODIUM CHLORIDE (POUR BTL) OPTIME
TOPICAL | Status: DC | PRN
  Administered 2018-11-25: 2000 mL

## 2018-11-25 MED ORDER — PHENYLEPHRINE 40 MCG/ML (10ML) SYRINGE FOR IV PUSH (FOR BLOOD PRESSURE SUPPORT)
PREFILLED_SYRINGE | INTRAVENOUS | Status: AC
  Filled 2018-11-25: qty 10

## 2018-11-25 MED ORDER — HEMOSTATIC AGENTS (NO CHARGE) OPTIME
TOPICAL | Status: DC | PRN
  Administered 2018-11-25 (×2): 1 via TOPICAL

## 2018-11-25 MED ORDER — DEXMEDETOMIDINE HCL IN NACL 200 MCG/50ML IV SOLN
INTRAVENOUS | Status: AC
  Filled 2018-11-25: qty 50

## 2018-11-25 MED ORDER — ESMOLOL HCL 100 MG/10ML IV SOLN
INTRAVENOUS | Status: AC
  Filled 2018-11-25: qty 20

## 2018-11-25 MED ORDER — SUCCINYLCHOLINE CHLORIDE 200 MG/10ML IV SOSY
PREFILLED_SYRINGE | INTRAVENOUS | Status: AC
  Filled 2018-11-25: qty 10

## 2018-11-25 MED ORDER — ALBUMIN HUMAN 5 % IV SOLN
INTRAVENOUS | Status: DC | PRN
  Administered 2018-11-25 (×6): via INTRAVENOUS

## 2018-11-25 MED ORDER — DEXMEDETOMIDINE HCL IN NACL 200 MCG/50ML IV SOLN
INTRAVENOUS | Status: DC | PRN
  Administered 2018-11-25: .5 ug/kg/h via INTRAVENOUS

## 2018-11-25 MED ORDER — CHLORHEXIDINE GLUCONATE 0.12 % MT SOLN
15.0000 mL | OROMUCOSAL | Status: AC
  Administered 2018-11-25: 15 mL via OROMUCOSAL
  Filled 2018-11-25: qty 15

## 2018-11-25 MED ORDER — ROCURONIUM BROMIDE 10 MG/ML (PF) SYRINGE
PREFILLED_SYRINGE | INTRAVENOUS | Status: DC | PRN
  Administered 2018-11-25: 50 mg via INTRAVENOUS
  Administered 2018-11-25: 20 mg via INTRAVENOUS
  Administered 2018-11-25 (×2): 50 mg via INTRAVENOUS
  Administered 2018-11-25: 20 mg via INTRAVENOUS

## 2018-11-25 MED ORDER — ALBUTEROL SULFATE (2.5 MG/3ML) 0.083% IN NEBU: 2.5000 mg | INHALATION_SOLUTION | Freq: Four times a day (QID) | RESPIRATORY_TRACT | Status: AC | PRN

## 2018-11-25 MED ORDER — FENTANYL CITRATE (PF) 250 MCG/5ML IJ SOLN
INTRAMUSCULAR | Status: DC | PRN
  Administered 2018-11-25: 50 ug via INTRAVENOUS
  Administered 2018-11-25: 100 ug via INTRAVENOUS
  Administered 2018-11-25: 50 ug via INTRAVENOUS
  Administered 2018-11-25 (×2): 100 ug via INTRAVENOUS
  Administered 2018-11-25 (×2): 50 ug via INTRAVENOUS
  Administered 2018-11-25: 100 ug via INTRAVENOUS
  Administered 2018-11-25 (×5): 50 ug via INTRAVENOUS
  Administered 2018-11-25: 100 ug via INTRAVENOUS
  Administered 2018-11-25: 50 ug via INTRAVENOUS

## 2018-11-25 MED ORDER — CHLORHEXIDINE GLUCONATE CLOTH 2 % EX PADS
6.0000 | MEDICATED_PAD | Freq: Every day | CUTANEOUS | Status: DC
  Administered 2018-11-27 – 2018-12-08 (×12): 6 via TOPICAL

## 2018-11-25 MED ORDER — MIDAZOLAM HCL 5 MG/5ML IJ SOLN
INTRAMUSCULAR | Status: DC | PRN
  Administered 2018-11-25 (×3): 2 mg via INTRAVENOUS

## 2018-11-25 MED ORDER — CEFOXITIN SODIUM-DEXTROSE 1-4 GM-%(50ML) IV SOLR (PREMIX)
INTRAVENOUS | Status: AC
  Filled 2018-11-25: qty 50

## 2018-11-25 MED ORDER — PROPOFOL 10 MG/ML IV BOLUS
INTRAVENOUS | Status: AC
  Filled 2018-11-25: qty 20

## 2018-11-25 MED ORDER — ONDANSETRON HCL 4 MG/2ML IJ SOLN: 4.0000 mg | INTRAMUSCULAR | Status: DC | PRN

## 2018-11-25 MED ORDER — ESMOLOL HCL 100 MG/10ML IV SOLN
INTRAVENOUS | Status: DC | PRN
  Administered 2018-11-25 (×2): 30 mg via INTRAVENOUS
  Administered 2018-11-25 (×2): 20 mg via INTRAVENOUS

## 2018-11-25 MED ORDER — ORAL CARE MOUTH RINSE: 15.0000 mL | OROMUCOSAL | Status: DC

## 2018-11-25 MED ORDER — SODIUM CHLORIDE 0.9% FLUSH
10.0000 mL | Freq: Two times a day (BID) | INTRAVENOUS | Status: DC
  Administered 2018-11-25 – 2018-11-29 (×8): 10 mL
  Administered 2018-11-29: 20 mL
  Administered 2018-11-30 – 2018-12-02 (×5): 10 mL
  Administered 2018-12-03: 09:00:00 40 mL
  Administered 2018-12-03 – 2018-12-16 (×24): 10 mL

## 2018-11-25 MED ORDER — DEXAMETHASONE SODIUM PHOSPHATE 10 MG/ML IJ SOLN
INTRAMUSCULAR | Status: AC
  Filled 2018-11-25: qty 1

## 2018-11-25 MED ORDER — ONDANSETRON HCL 4 MG/2ML IJ SOLN
INTRAMUSCULAR | Status: AC
  Filled 2018-11-25: qty 2

## 2018-11-25 MED ORDER — ARTIFICIAL TEARS OPHTHALMIC OINT
TOPICAL_OINTMENT | OPHTHALMIC | Status: DC | PRN
  Administered 2018-11-25: 1 via OPHTHALMIC

## 2018-11-25 MED ORDER — CHLORHEXIDINE GLUCONATE CLOTH 2 % EX PADS
6.0000 | MEDICATED_PAD | Freq: Every day | CUTANEOUS | Status: DC
  Administered 2018-11-28: 6 via TOPICAL

## 2018-11-25 MED ORDER — LIDOCAINE 2% (20 MG/ML) 5 ML SYRINGE
INTRAMUSCULAR | Status: AC
  Filled 2018-11-25: qty 5

## 2018-11-25 MED ORDER — LACTATED RINGERS IV SOLN
INTRAVENOUS | Status: DC | PRN
  Administered 2018-11-25 (×4): via INTRAVENOUS

## 2018-11-25 MED ORDER — LACTATED RINGERS IV SOLN
INTRAVENOUS | Status: DC | PRN
  Administered 2018-11-25 (×2): via INTRAVENOUS

## 2018-11-25 SURGICAL SUPPLY — 128 items
ADAPTER VALVE BIOPSY EBUS (MISCELLANEOUS) IMPLANT
ADPTR VALVE BIOPSY EBUS (MISCELLANEOUS)
BRUSH CYTOL CELLEBRITY 1.5X140 (MISCELLANEOUS) IMPLANT
CANISTER SUCT 3000ML PPV (MISCELLANEOUS) ×4 IMPLANT
CATH FOLEY 2WAY SLVR 18FR 30CC (CATHETERS) ×4 IMPLANT
CATH ROBINSON RED A/P 18FR (CATHETERS) ×4 IMPLANT
CATH THORACIC 28FR (CATHETERS) ×4 IMPLANT
CLIP FOGARTY SPRING 6M (CLIP) ×4 IMPLANT
CLIP VESOCCLUDE LG 6/CT (CLIP) IMPLANT
CLIP VESOCCLUDE MED 24/CT (CLIP) ×4 IMPLANT
CLIP VESOCCLUDE SM WIDE 24/CT (CLIP) ×4 IMPLANT
CONT SPEC 4OZ CLIKSEAL STRL BL (MISCELLANEOUS) ×8 IMPLANT
COVER BACK TABLE 60X90IN (DRAPES) IMPLANT
COVER PROBE W GEL 5X96 (DRAPES) ×8 IMPLANT
COVER WAND RF STERILE (DRAPES) IMPLANT
DERMABOND ADVANCED (GAUZE/BANDAGES/DRESSINGS) ×1
DERMABOND ADVANCED .7 DNX12 (GAUZE/BANDAGES/DRESSINGS) ×3 IMPLANT
DRAIN CHANNEL 28F RND 3/8 FF (WOUND CARE) ×4 IMPLANT
DRAIN PENROSE 1/2X36 STERILE (WOUND CARE) IMPLANT
DRAIN PENROSE 18X1/2 LTX STRL (DRAIN) ×8 IMPLANT
DRAIN SUMP SARATOGA 24F (WOUND CARE) IMPLANT
DRAPE CAMERA VIDEO/LASER (DRAPES) IMPLANT
DRAPE CV SPLIT W-CLR ANES SCRN (DRAPES) ×4 IMPLANT
DRAPE HALF SHEET 40X57 (DRAPES) ×4 IMPLANT
DRAPE INCISE IOBAN 66X45 STRL (DRAPES) ×4 IMPLANT
DRAPE PERI GROIN 82X75IN TIB (DRAPES) ×4 IMPLANT
DRAPE SLUSH/WARMER DISC (DRAPES) ×4 IMPLANT
DRSG AQUACEL AG ADV 3.5X10 (GAUZE/BANDAGES/DRESSINGS) ×4 IMPLANT
DRSG AQUACEL AG ADV 3.5X14 (GAUZE/BANDAGES/DRESSINGS) IMPLANT
ELECT BLADE 4.0 EZ CLEAN MEGAD (MISCELLANEOUS) ×4
ELECT BLADE 6.5 EXT (BLADE) ×4 IMPLANT
ELECT NEEDLE TIP 2.8 STRL (NEEDLE) ×4 IMPLANT
ELECT REM PT RETURN 9FT ADLT (ELECTROSURGICAL) ×8
ELECTRODE BLDE 4.0 EZ CLN MEGD (MISCELLANEOUS) ×3 IMPLANT
ELECTRODE REM PT RTRN 9FT ADLT (ELECTROSURGICAL) ×6 IMPLANT
FORCEPS BIOP RJ4 1.8 (CUTTING FORCEPS) IMPLANT
GAUZE SPONGE 4X4 12PLY STRL (GAUZE/BANDAGES/DRESSINGS) IMPLANT
GAUZE SPONGE 4X4 12PLY STRL LF (GAUZE/BANDAGES/DRESSINGS) ×16 IMPLANT
GLOVE BIO SURGEON STRL SZ 6.5 (GLOVE) ×12 IMPLANT
GOWN STRL REUS W/ TWL LRG LVL3 (GOWN DISPOSABLE) ×12 IMPLANT
GOWN STRL REUS W/TWL LRG LVL3 (GOWN DISPOSABLE) ×4
HEMOSTAT SURGICEL 2X14 (HEMOSTASIS) ×4 IMPLANT
INSERT FOGARTY 61MM (MISCELLANEOUS) IMPLANT
KIT BASIN OR (CUSTOM PROCEDURE TRAY) ×4 IMPLANT
KIT CLEAN ENDO COMPLIANCE (KITS) IMPLANT
KIT SUCTION CATH 14FR (SUCTIONS) ×4 IMPLANT
KIT TURNOVER KIT B (KITS) ×4 IMPLANT
LOOP VESSEL MINI RED (MISCELLANEOUS) ×4 IMPLANT
MARKER SKIN DUAL TIP RULER LAB (MISCELLANEOUS) IMPLANT
NS IRRIG 1000ML POUR BTL (IV SOLUTION) ×8 IMPLANT
OIL SILICONE PENTAX (PARTS (SERVICE/REPAIRS)) IMPLANT
PACK CHEST (CUSTOM PROCEDURE TRAY) ×4 IMPLANT
PAD ARMBOARD 7.5X6 YLW CONV (MISCELLANEOUS) ×8 IMPLANT
PAD SHARPS MAGNETIC DISPOSAL (MISCELLANEOUS) IMPLANT
PENCIL BUTTON HOLSTER BLD 10FT (ELECTRODE) IMPLANT
PLUG CATH AND CAP STER (CATHETERS) ×4 IMPLANT
PROBE NERVBE PRASS .33 (MISCELLANEOUS) ×4 IMPLANT
PROBE PENCIL 8 MHZ STRL DISP (MISCELLANEOUS) ×4 IMPLANT
RELOAD EGIA 45 MED/THCK PURPLE (STAPLE) IMPLANT
RELOAD EGIA TRIS TAN 45 CVD (STAPLE) IMPLANT
RELOAD LINEAR CUT PROX 55 BLUE (ENDOMECHANICALS) ×36 IMPLANT
RELOAD TRI 2.0 30 MED THCK SUL (STAPLE) ×4 IMPLANT
RETAINER VISCERA MED (MISCELLANEOUS) ×4 IMPLANT
RETRACTOR WND ALEXIS 25 LRG (MISCELLANEOUS) ×3 IMPLANT
RTRCTR WOUND ALEXIS 25CM LRG (MISCELLANEOUS) ×4
SEALANT PATCH FIBRIN 2X4IN (MISCELLANEOUS) ×4 IMPLANT
SEALER TISSUE X1 CVD JAW (INSTRUMENTS) ×4 IMPLANT
SHEARS HARMONIC ACE PLUS 36CM (ENDOMECHANICALS) IMPLANT
SHEARS HARMONIC HDI 20CM (ELECTROSURGICAL) ×4 IMPLANT
SPONGE LAP 18X18 RF (DISPOSABLE) ×8 IMPLANT
SPONGE LAP 4X18 RFD (DISPOSABLE) ×8 IMPLANT
STAPLE RELOAD 2.5MM WHITE (STAPLE) ×4 IMPLANT
STAPLER ENDO GIA 12MM SHORT (STAPLE) ×4 IMPLANT
STAPLER PROXIMATE 55 BLUE (STAPLE) ×4 IMPLANT
STAPLER VASCULAR ECHELON 35 (CUTTER) ×4 IMPLANT
STAPLER VISISTAT 35W (STAPLE) IMPLANT
SUCTION POOLE TIP (SUCTIONS) IMPLANT
SUT ETHILON 3 0 FSL (SUTURE) ×16 IMPLANT
SUT PDS AB 1 TP1 96 (SUTURE) ×8 IMPLANT
SUT PDS AB 4-0 SH 27 (SUTURE) IMPLANT
SUT PROLENE 0 CT 1 CR/8 (SUTURE) IMPLANT
SUT PROLENE 2 0 MH 48 (SUTURE) IMPLANT
SUT PROLENE 2 TP 1 (SUTURE) ×8 IMPLANT
SUT PROLENE 3 0 RB 1 (SUTURE) ×4 IMPLANT
SUT PROLENE 3 0 SH1 36 (SUTURE) ×4 IMPLANT
SUT PROLENE 4 0 RB 1 (SUTURE) ×2
SUT PROLENE 4-0 RB1 .5 CRCL 36 (SUTURE) ×6 IMPLANT
SUT SILK  1 MH (SUTURE) ×2
SUT SILK 1 MH (SUTURE) ×6 IMPLANT
SUT SILK 1 TIES 10X30 (SUTURE) ×4 IMPLANT
SUT SILK 2 0 SH CR/8 (SUTURE) ×8 IMPLANT
SUT SILK 2 0SH CR/8 30 (SUTURE) ×4 IMPLANT
SUT SILK 3 0 SH CR/8 (SUTURE) IMPLANT
SUT SILK 3 0SH CR/8 30 (SUTURE) IMPLANT
SUT SILK 4 0 SH CR/8 (SUTURE) ×4 IMPLANT
SUT VIC AB 1 CTX 27 (SUTURE) IMPLANT
SUT VIC AB 2-0 CT1 18 (SUTURE) IMPLANT
SUT VIC AB 2-0 CTX 36 (SUTURE) ×8 IMPLANT
SUT VIC AB 3-0 MH 27 (SUTURE) ×4 IMPLANT
SUT VIC AB 3-0 SH 18 (SUTURE) IMPLANT
SUT VIC AB 3-0 X1 27 (SUTURE) ×12 IMPLANT
SUT VIC AB 4-0 SH 18 (SUTURE) ×12 IMPLANT
SUT VICRYL 2 TP 1 (SUTURE) IMPLANT
SYR 10ML LL (SYRINGE) IMPLANT
SYR 20CC LL (SYRINGE) IMPLANT
SYR 20ML ECCENTRIC (SYRINGE) ×4 IMPLANT
SYR 30ML LL (SYRINGE) ×4 IMPLANT
SYR 30ML SLIP (SYRINGE) IMPLANT
SYR 5ML LUER SLIP (SYRINGE) ×4 IMPLANT
SYR TOOMEY 50ML (SYRINGE) IMPLANT
SYSTEM SAHARA CHEST DRAIN RE-I (WOUND CARE) ×4 IMPLANT
TAPE CLOTH SURG 4X10 WHT LF (GAUZE/BANDAGES/DRESSINGS) ×16 IMPLANT
TAPE UMBILICAL 1/8 X36 TWILL (MISCELLANEOUS) ×4 IMPLANT
TOWEL GREEN STERILE (TOWEL DISPOSABLE) ×4 IMPLANT
TOWEL GREEN STERILE FF (TOWEL DISPOSABLE) ×4 IMPLANT
TRAP SPECIMEN MUCOUS 40CC (MISCELLANEOUS) IMPLANT
TRAY FOLEY MTR SLVR 16FR STAT (SET/KITS/TRAYS/PACK) ×4 IMPLANT
TUBE CONNECTING 12X1/4 (SUCTIONS) IMPLANT
TUBE CONNECTING 20X1/4 (TUBING) IMPLANT
TUBE ENDOTRAC EMG 7X10.2 (MISCELLANEOUS) IMPLANT
TUBE ENDOTRAC EMG 8X11.3 (MISCELLANEOUS) ×4 IMPLANT
TUBE ENDOTRACH  EMG 6MMTUBE EN (MISCELLANEOUS)
TUBE ENDOTRACH EMG 6MMTUBE EN (MISCELLANEOUS) IMPLANT
TUBE J 18FR (TUBING) ×4 IMPLANT
VALVE BIOPSY  SINGLE USE (MISCELLANEOUS)
VALVE BIOPSY SINGLE USE (MISCELLANEOUS) IMPLANT
VALVE SUCTION BRONCHIO DISP (MISCELLANEOUS) IMPLANT
WATER STERILE IRR 1000ML POUR (IV SOLUTION) ×8 IMPLANT

## 2018-11-25 NOTE — H&P (Signed)
[address]       [place] [address]             [phone]                    [name] [name] Medical Record #[id number] Date of Birth: 12/22/1950 Referring: Ladell Pier, MD Primary Care: Binnie Rail, MD Primary Cardiologist: Lauree Chandler, MD  Chief Complaint:    Esophageal cancer   History of Present Illness:    [name] [name] 68 y.o. 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  The patient's disease first came to medical attention when he presented to his primary care doctor with new anemia, repeat labs confirmed anemia.  Patient denies any known observed blood in his stool.  He denies any loss of weight, he has noted some very mild discomfort with swallowing, but not to the extent of interfering with his diet   Colonoscopy and upper GI endoscopy were performed.  Upper GI endoscopy showed  A large, submucosal and ulcerating mass with bleeding and stigmata of recent bleeding was found in the distal esophagus, at the gastroesophageal junction and in the cardia, 38 cm from the incisors.  CT and PET scans have been done. 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  He notes early in his career in the 1980s he did do some work at the power plant that could have exposed him to asbestos.  Patient has been a smoker for proximately half a pack a day for 50 years. He denies any previous history of reflux  ? Radiation beginning 08/21/2018 ? Cycle 1 weekly Taxol/carboplatin 08/21/2018 ? Cycle 2 weekly Taxol/carboplatin 08/27/2018 ? Cycle 3 weekly Taxol/carboplatin 09/03/2018 ? Cycle 4 weekly Taxol/carboplatin 09/10/2018  Cycle 5 weekly Taxol/carboplatin 09/17/2018  Patient notes that his chemotherapy  was completed March 31, last Radiation was April 7.  He  has not been smoking for the last 3 months, has been doing reasonably well with his weight maintaining 150 to 154  pounds.  He has had vague epigastric discomfort and question of shortness of breath with increased activity, he notes this is not related to eating.   Since last seen he was seen for cardiology evaluation and a cardiac stress test was performed and he was cleared from a cardiac standpoint for surgery.  Current Activity/ Functional Status:  Patient is independent with mobility/ambulation, transfers, ADL's, IADL's.   [name] [name]: At the time of surgery this patients most appropriate activity status/level should be described as: [x]     0    Normal activity, no symptoms []     1    Restricted in physical strenuous activity but ambulatory, able to do out light work []     2    Ambulatory and capable of self care, unable to do work activities, up and about               >50 % of waking hours                              []     3    Only limited self care, in bed greater than 50% of waking hours []     4  Completely disabled, no self care, confined to bed or chair []     5    Moribund   Past Medical History:  Diagnosis Date   Adenocarcinoma of gastroesophageal junction ([place]) 07/29/2018   Anemia    Diverticulosis    Hyperlipidemia    Hypertension    Tobacco abuse     Past Surgical History:  Procedure Laterality Date   COLONOSCOPY     WISDOM TOOTH EXTRACTION      Family History  Problem Relation Age of Onset   Cancer Mother        bone cancer   Heart attack Father 23   Cancer Sister        uterine cancer   Heart attack Paternal Uncle        X2, both < 58   COPD Neg Hx    Diabetes Neg Hx    Stroke Neg Hx    Colon cancer Neg Hx    Patient's mother died in her 53s of "bone cancer she had one sister died of cancer of unknown cause his father died at age 48 grandfather died at age 74 and one brother died at age 73 all of sudden death presumed to be a  myocardial infarction  Social History   Tobacco Use  Smoking Status Former Smoker   Packs/day: 0.25   Years: 50.00   Pack years: 12.50   Types: Cigarettes   Last attempt to quit: 06/19/2018   Years since quitting: 0.4  Smokeless Tobacco Never Used  Tobacco Comment   06/02/14 2-3 pp week    Social History   Substance and Sexual Activity  Alcohol Use Yes   Alcohol/week: 2.0 standard drinks   Types: 2 Glasses of wine per week   Comment:  occasionally, not daily     No Known Allergies  Current Facility-Administered Medications  Medication Dose Route Frequency Provider Last Rate Last Dose   cefOXitin (MEFOXIN) 2 g in sodium chloride 0.9 % 100 mL IVPB  2 g Intravenous On Call to OR [name], MD       Facility-Administered Medications Ordered in Other Encounters  Medication Dose Route Frequency Provider Last Rate Last Dose   alum & mag hydroxide-simeth (MAALOX/MYLANTA) 200-200-20 MG/5ML suspension 30 mL  30 mL Oral Once Harle Stanford., PA-C       And   lidocaine (XYLOCAINE) 2 % viscous mouth solution 15 mL  15 mL Oral Once Harle Stanford., PA-C       fentaNYL (SUBLIMAZE) injection   Intravenous Anesthesia Intra-op Renato Shin, CRNA   50 mcg at 11/25/18 1443   lactated ringers infusion    Continuous PRN Renato Shin, CRNA       midazolam (VERSED) 5 MG/5ML injection    Anesthesia Intra-op Renato Shin, CRNA   2 mg at 11/25/18 1540    Pertinent items are noted in HPI.   Review of Systems:     Cardiac Review of Systems: [Y] = yes  or   [ N ] = no   Chest Pain [ N]  Resting SOB [ N] Exertional SOB  [MILD ]  Orthopnea [  ]   Pedal Edema Aqua.Slicker ]    Palpitations Aqua.Slicker  ] Syncope  [ N]   Presyncope Aqua.Slicker  ]   General Review of Systems: [Y] = yes [  ]=no Constitional: recent weight change [none  ];  Wt loss over the last 3 months [   ] anorexia [  ];  fatigue [  ]; nausea [  ]; night sweats [  ]; fever [  ]; or chills [  ];           Eye : blurred vision [  ];  diplopia [   ]; vision changes [  ];  Amaurosis fugax[  ]; Resp: cough [  ];  wheezing[  ];  hemoptysis[  ]; shortness of breath[  ]; paroxysmal nocturnal dyspnea[  ]; dyspnea on exertion[  ]; or orthopnea[  ];  GI:  gallstones[  ], vomiting[  ];  dysphagia[  ]; melena[  ];  hematochezia [  ]; heartburn[  ];   Hx of  Colonoscopy[  ]; GU: kidney stones [  ]; hematuria[  ];   dysuria [  ];  nocturia[  ];  history of     obstruction [  ]; urinary frequency [  ]             Skin: rash, swelling[  ];, hair loss[  ];  peripheral edema[  ];  or itching[  ]; Musculosketetal: myalgias[  ];  joint swelling[  ];  joint erythema[  ];  joint pain[  ];  back pain[  ];  Heme/Lymph: bruising[  ];  bleeding[  ];  anemia[  ];  Neuro: TIA[  ];  headaches[  ];  stroke[  ];  vertigo[  ];  seizures[  ];   paresthesias[  ];  difficulty walking[  ];  Psych:depression[  ]; anxiety[  ];  Endocrine: diabetes[  ];  thyroid dysfunction[  ];  Immunizations: Flu up to date Jazmín.Cullens ]; Pneumococcal up to date [Y ];  Other:     PHYSICAL EXAMINATION: BP 124/76    Pulse 63    Temp 97.9 F (36.6 C) (Oral)    Resp 18    SpO2 96%  General appearance: alert, cooperative and no distress Head: Normocephalic, without obvious abnormality, atraumatic Neck: no adenopathy, no carotid bruit, no JVD, supple, symmetrical, trachea midline and thyroid not enlarged, symmetric, no tenderness/mass/nodules Lymph nodes: Cervical, supraclavicular, and axillary nodes normal. Resp: clear to auscultation bilaterally Back: symmetric, no curvature. ROM normal. No CVA tenderness. Cardio: regular rate and rhythm, S1, S2 normal, no murmur, click, rub or gallop GI: soft, non-tender; bowel sounds normal; no masses,  no organomegaly Extremities: extremities normal, atraumatic, no cyanosis or edema and Homans sign is negative, no sign of DVT Neurologic: Grossly normal  Diagnostic Studies & Laboratory data:     Recent Radiology Findings:  Dg Chest 2  View  Result Date: 11/22/2018 CLINICAL DATA:  Esophageal malignancy.  Preoperative exam. EXAM: CHEST - 2 VIEW COMPARISON:  CT 10/24/2018. FINDINGS: Mediastinum hilar structures normal. Lungs are clear. No pleural effusion or pneumothorax. Heart size normal. Degenerative change thoracic spine several air-filled loops of small bowel noted. Abdominal series can be obtained to further evaluate. IMPRESSION: 1.  No acute cardiopulmonary disease. 2. Several air-filled loops of small bowel are noted. Abdominal series can be obtained to further evaluate. Electronically Signed   By: Hope   On: 11/22/2018 11:44   Ct Chest W Contrast/ Ct Abdomen W Contrast   Result Date: 10/24/2018 CLINICAL DATA:  Esophageal cancer restaging EXAM: CT CHEST AND ABDOMEN WITH CONTRAST TECHNIQUE: Multidetector CT imaging of the chest and abdomen was performed following the standard protocol during bolus administration of intravenous contrast. CONTRAST:  151mL ISOVUE-300 IOPAMIDOL (ISOVUE-300) INJECTION 61% COMPARISON:  Multiple exams, including PET-CT from 08/09/2018 FINDINGS: CT CHEST FINDINGS Cardiovascular:  Minimal atherosclerotic calcification of the aortic arch. Density in the left anterior descending coronary artery potentially atherosclerotic calcification or a stent. Mediastinum/Nodes: Small mediastinal and axillary lymph nodes are not pathologically enlarged and are similar to the prior exam. Continued circumferential distal esophageal wall thickening extending into the stomach. Lungs/Pleura: Unremarkable Musculoskeletal: Mild thoracic spondylosis. CT ABDOMEN FINDINGS Hepatobiliary: The hypodense lesions in the left hepatic lobe are stable and probably cysts. No new or worrisome enhancing hepatic lesion. Gallbladder unremarkable. No biliary dilatation. Pancreas: Unremarkable Spleen: Unremarkable Adrenals/Urinary Tract: Stable appearance of bilateral adrenal adenomas. The kidneys appear unremarkable. Stomach/Bowel: The  gastric cardia portion of the gastroesophageal mass is less striking than on the 08/09/2018 exam and less striking than on 08/01/2018. Adjacent gastrohepatic ligament adenopathy is still present with clustered nodes measuring 1.2 cm in short axis, previously the same on 08/01/2018. Vascular/Lymphatic: As noted above there are continued clustered gastrohepatic ligament lymph nodes. An aortocaval node measures 0.8 cm in short axis on image 76/2, formerly the same, and not formerly hypermetabolic. Other: No supplemental non-categorized findings. Musculoskeletal: Mild right foraminal impingement at L4-5 due to spurring. IMPRESSION: 1. From a morphologic standpoint, the gastric cardia portion of the gastroesophageal mass is less striking than on the prior exam but the distal esophageal wall continues to appear thickened. The clustered gastrohepatic little lymph nodes which were previously mildly abnormally hypermetabolic have not significantly changed in size/appearance. 2. Other imaging findings of potential clinical significance: Aortic Atherosclerosis (ICD10-I70.0). Coronary atherosclerosis. Left hepatic lobe cysts. Bilateral adrenal adenomas. Mild right foraminal impingement at L4-5. Electronically Signed   By: Van Clines M.D.   On: 10/24/2018 12:18   I have independently reviewed the above radiology studies  and reviewed the findings with the patient.    Ct Chest W ContrastCt Abdomen Pelvis W Contrast  Result Date: 08/01/2018 CLINICAL DATA:  Primary adenocarcinoma of the esophagogastric junction. EXAM: CT CHEST, ABDOMEN, AND PELVIS WITH CONTRAST TECHNIQUE: Multidetector CT imaging of the chest, abdomen and pelvis was performed following the standard protocol during bolus administration of intravenous contrast. CONTRAST:  171mL ISOVUE-300 IOPAMIDOL (ISOVUE-300) INJECTION 61% COMPARISON:  No comparison studies available. FINDINGS: CT CHEST FINDINGS Cardiovascular: The heart size is normal. No  substantial pericardial effusion. Coronary artery calcification is evident. Atherosclerotic calcification is noted in the wall of the thoracic aorta. Mediastinum/Nodes: No mediastinal lymphadenopathy. There is no hilar lymphadenopathy. Mild wall thickening noted distal esophagus extending into the esophagogastric junction. There is no axillary lymphadenopathy. Lungs/Pleura: The central tracheobronchial airways are patent. No suspicious pulmonary nodule or mass. No focal airspace consolidation. No pulmonary edema or pleural effusion. Musculoskeletal: No worrisome lytic or sclerotic osseous abnormality. CT ABDOMEN PELVIS FINDINGS Hepatobiliary: 15 mm water density lesion in the medial segment left liver is probably a cyst. 7 mm low-density lesion in the dome of the liver (45/2) is too small to characterize. No overtly suspicious or enhancing liver lesion evident. There is no evidence for gallstones, gallbladder wall thickening, or pericholecystic fluid. No intrahepatic or extrahepatic biliary dilation. Pancreas: No focal mass lesion. No dilatation of the main duct. No intraparenchymal cyst. No peripancreatic edema. Spleen: No splenomegaly. No focal mass lesion. Adrenals/Urinary Tract: Thickening of the adrenal glands evident bilaterally, left greater than right. Dominant nodular component in the left adrenal gland measures 2.8 cm. Relative washout for this dominant nodular component in the left adrenal gland is 56%, most suggestive of adenoma. Kidneys unremarkable. No evidence for hydroureter. The urinary bladder appears normal for the degree of distention. Stomach/Bowel: Wall thickening  is noted in the esophagogastric junction/cardia. Stomach otherwise unremarkable. Duodenum is normally positioned as is the ligament of Treitz. No small bowel wall thickening. No small bowel dilatation. The terminal ileum is normal. The appendix is normal. No gross colonic mass. No colonic wall thickening. Vascular/Lymphatic: There is  abdominal aortic atherosclerosis without aneurysm. 2.8 x 1.3 cm node versus nodal conglomeration identified in the gastrohepatic ligament (53/2). Small 7 x 11 mm perigastric node visible on 51/2. No hepato duodenal ligament lymphadenopathy. Small para-aortic retroperitoneal nodes evident without retroperitoneal lymphadenopathy. Reproductive: Prostate gland is enlarged. Other: No intraperitoneal free fluid. Musculoskeletal: Bilateral groin hernias contain only fat. Tiny sclerotic focus right ischial tuberosity, likely a bone island. No worrisome lytic or sclerotic osseous abnormality. Degenerative changes noted diffusely in the lumbar spine. IMPRESSION: 1. Circumferential wall thickening noted in the distal esophagus extending into the esophagogastric junction and gastric cardia compatible with patient's known neoplasm. 2. 2.8 x 1.3 cm collection of small nodes versus single irregular enlarged lymph node in the gastrohepatic ligament concerning for metastatic disease. There is an adjacent 7 x 11 mm perigastric node, also concerning. 3. 15 mm lesion in the medial segment left liver has imaging features most suggestive of a simple cyst. 7 mm low-density lesion in the dome of the liver, too small to characterize and attention on follow-up recommended. 4. Bilateral nodular thickening of the adrenal glands with imaging features most suggestive of adenomatous enlargement. 5.  Aortic Atherosclerois (ICD10-170.0) Electronically Signed   By: Misty Stanley M.D.   On: 08/01/2018 11:41   Nm Pet Image Initial (pi) Skull Base To Thigh  Result Date: 08/09/2018 CLINICAL DATA:  Initial treatment strategy for esophageal cancer. EXAM: NUCLEAR MEDICINE PET SKULL BASE TO THIGH TECHNIQUE: 8.4 mCi F-18 FDG was injected intravenously. Full-ring PET imaging was performed from the skull base to thigh after the radiotracer. CT data was obtained and used for attenuation correction and anatomic localization. Fasting blood glucose: 103 mg/dl  COMPARISON:  CT scan 08/01/2018 FINDINGS: Mediastinal blood pool activity: SUV max 2.6 NECK: No significant abnormal hypermetabolic activity in this region. Incidental CT findings: Complete opacification of the left frontal sinus and multiple left anterior ethmoid air cells, which could reflect chronic or acute sinusitis. Subtotal opacification left maxillary sinus from chronic sinusitis. Suspected upper left nasal mucosal polyps. Bilateral common carotid artery atherosclerotic calcification. CHEST: The distal esophageal component of the mass spanning the gastroesophageal junction has a maximum SUV of 9.0. An AP window lymph node measuring 0.8 cm in short axis on image 66/4 has a maximum SUV of 1.9. A left axillary node with fatty hilum measuring 1.1 cm in short axis on image 61/4 has a maximum SUV of 1.8. Incidental CT findings: Atherosclerotic calcification of the aortic arch and left anterior descending coronary artery. Borderline cardiomegaly. ABDOMEN/PELVIS: The gastric portion of the gastroesophageal mass primarily involves the gastric cardia and has a maximum SUV of 9.6. Adjacent clustered lymph nodes in the gastrohepatic ligament are observed individually measuring up to 1.0 cm in short axis on image 107/4, and collectively with a maximum SUV of 3.2. The larger of the hypodense liver lesions is in the lateral segment left hepatic lobe and is photopenic, favoring a cyst. The smaller lesions are technically nonspecific due to small size, but demonstrate no appreciable hypermetabolic activity. 1.5 by 2.5 cm right adrenal adenoma. Thickened low-density left adrenal gland compatible with adenoma measuring about 4.4 by 1.9 cm. Neither adrenal gland is overtly hypermetabolic. Incidental CT findings: Aortoiliac atherosclerotic vascular disease.  Prostatomegaly. SKELETON: No significant abnormal hypermetabolic activity in this region. Incidental CT findings: none IMPRESSION: 1. Hypermetabolic mass spanning the  gastroesophageal junction. The distal esophageal component has a maximum SUV of 9.0 and gastric cardia component has a maximum SUV of 9.6. Clustered gastrohepatic ligament lymph nodes are likely involved, with a maximum SUV of 3.2 which is mildly above the blood pool level. No compelling findings of hepatic metastatic disease at this time. 2. Other imaging findings of potential clinical significance: Complete opacification of the left frontal sinus and multiple ethmoid air cells which could be from acute or chronic sinusitis. Subtotal opacification of the left maxillary sinus from chronic sinusitis. Bilateral adrenal adenomas. Aortic Atherosclerosis (ICD10-I70.0). Coronary atherosclerosis. Prostatomegaly. Electronically Signed   By: Van Clines M.D.   On: 08/09/2018 09:08     I have independently reviewed the above radiology studies  and reviewed the findings with the patient.   Recent Lab Findings: Lab Results  Component Value Date   WBC 6.8 11/22/2018   HGB 14.3 11/22/2018   HCT 41.7 11/22/2018   PLT 349 11/22/2018   GLUCOSE 96 11/22/2018   CHOL 161 05/06/2018   TRIG 134.0 05/06/2018   HDL 54.20 05/06/2018   LDLDIRECT 131.7 12/27/2009   LDLCALC 80 05/06/2018   ALT 14 11/22/2018   AST 18 11/22/2018   NA 133 (L) 11/22/2018   K 4.5 11/22/2018   CL 101 11/22/2018   CREATININE 0.95 11/22/2018   BUN 22 11/22/2018   CO2 18 (L) 11/22/2018   TSH 1.24 05/06/2018   INR 1.1 11/22/2018   HGBA1C 5.4 05/06/2018   ENDO:07/25/2018 A large, submucosal and ulcerating mass with bleeding and stigmata of recent bleeding was found in the distal esophagus, at the gastroesophageal junction and in the cardia, 38 cm from the incisors. The mass was non-obstructing. Biopsies were taken with a cold forceps for histology. Verification of patient identification for the specimen was done. Estimated blood loss was minimal. Findings: - Patchy mucosal changes characterized by nodularity were found in the  distal esophagus. Biopsies were taken with a cold forceps for histology. Verification of patient identification for the specimen was done. Estimated blood loss was minimal. - The exam was otherwise without abnormality. Add'l Images: Upper Gastrointestinal Tract Lower Third  Path: Diagnosis 1. Esophagogastric junction, biopsy, mass - ADENOCARCINOMA, SEE NOTE. 2. Esophagus, biopsy, distal esophagus nodular - ADENOCARCINOMA, SEE NOTE. 3. Colon, biopsy, cecum and ascending, polyp (2) - TUBULAR ADENOMA(S). - NEGATIVE FOR HIGH GRADE DYSPLASIA OR MALIGNANCY. Diagnosis Note 1. 1, 2. Dr Lyndon Code has reviewed this case and concurs with the above interpretation. Dr [name] was notified on 07/26/2018. (NK:ecj 07/26/2018) Jaquita Folds MD  Cardiac stress test: Study Highlights     The left ventricular ejection fraction is normal (55-65%).  Nuclear stress EF: 55%.  There was no ST segment deviation noted during stress.  The study is normal.  This is a low risk study.   Normal resting and stress perfusion. No ischemia or infarction EF 55% some thinning of inferior wall thought to be from diaphragm     Assessment / Plan:   #1 advanced stage adenocarcinoma involving the distal third of the esophagus and cardia of the stomach, GE junction, Siewert-Stein Type II  Likely clinicial  stage III the patient has completed a course of preoperative radiation and chemotherapy which she is tolerated well.  CT scan shows evidence of response, no evidence of  metastatic disease.  With the patient's history of smoking, evidence of coronary  calcification on CT scan, and vague symptoms of chest discomfort with exertion the patient  Was referred to cardiology ,  preoperative clearance by cardiology done with stress test     I discussed with the patient and his wife in detail the risks options of surgical resection esophageal cancer.  Patient is willing to proceed and we have arranged for surgery on June 8.  He  is aware of.BOWELPREP And to stop his lisinopril 36 hours prior to surgery.   I had a detailed discussion with [name] [name] regarding the magnitude of the surgical esophagectomy procedure as well as the risks, the expected benefits, and alternatives.  I quoted [name] [name] 5% perioperative mortality rate and a complication rate as high as 40%.  We specifically discussed complications, which include, but were not limited to: recurrent nerve injury with possible permanent hoarseness, anastomotic leak, airway and great vessel injury, conduit ischemia, thoracic duct leak, the inability to complete the operation via a transhiatal approach requiring a right thoracotomy,  bleeding, need for blood transfusion and the potential need for ventilator support.  [name] [name] has had questions answered is well informed and willing to proceed.  [name] MD      [place] [place] [address] Office [phone]   Beeper [phone]  11/25/2018 7:09 AM

## 2018-11-25 NOTE — Anesthesia Postprocedure Evaluation (Signed)
Anesthesia Post Note  Patient: [name] [name]  Procedure(s) Performed: VIDEO BRONCHOSCOPY (N/A ) TRANSHIATAL TOTAL ESOPHAGECTOMY COMPLETE WITH CERVICAL ESOPHAGOGASTROSTOMY (N/A Abdomen) FEEDING JEJUNOSTOMY (N/A ) Pyloroplasty     Patient location during evaluation: SICU Anesthesia Type: General Level of consciousness: sedated and patient remains intubated per anesthesia plan Pain management: pain level controlled Vital Signs Assessment: post-procedure vital signs reviewed and stable Respiratory status: patient remains intubated per anesthesia plan and patient on ventilator - see flowsheet for VS Cardiovascular status: stable Postop Assessment: no apparent nausea or vomiting Anesthetic complications: no    Last Vitals:  Vitals:   11/25/18 1615 11/25/18 1630  BP:    Pulse: (!) 48 (!) 47  Resp: 12 13  Temp:    SpO2: 100% 100%    Last Pain:  Vitals:   11/25/18 0610  TempSrc:   PainSc: 0-No pain                 Keeanna Villafranca COKER

## 2018-11-25 NOTE — Progress Notes (Signed)
Updated pt's wife [name] [name] on pt status. All questions answered. Brought phone into room so pt could talk with wife. Will attempt to set up video call tomorrow.

## 2018-11-25 NOTE — Transfer of Care (Signed)
Immediate Anesthesia Transfer of Care Note  Patient: [name] [name]  Procedure(s) Performed: VIDEO BRONCHOSCOPY (N/A ) TRANSHIATAL TOTAL ESOPHAGECTOMY COMPLETE WITH CERVICAL ESOPHAGOGASTROSTOMY (N/A Abdomen) FEEDING JEJUNOSTOMY (N/A ) Pyloroplasty  Patient Location: SICU  Anesthesia Type:General  Level of Consciousness: sedated and patient cooperative  Airway & Oxygen Therapy: Patient remains intubated per anesthesia plan and Patient placed on Ventilator (see vital sign flow sheet for setting)  Post-op Assessment: Report given to RN and Post -op Vital signs reviewed and stable  Post vital signs: Reviewed and stable  Last Vitals:  Vitals Value Taken Time  BP    Temp    Pulse    Resp    SpO2      Last Pain:  Vitals:   11/25/18 0610  TempSrc:   PainSc: 0-No pain      Patients Stated Pain Goal: 4 (44/96/75 9163)  Complications: No apparent anesthesia complications

## 2018-11-25 NOTE — Brief Op Note (Addendum)
      [address]       [place] [address]             [phone]      11/25/2018  4:16 PM  PATIENT:  [name] [name]  68 y.o. male  PRE-OPERATIVE DIAGNOSIS:  ESOPHAGEAL CANCER  POST-OPERATIVE DIAGNOSIS:  ESOPHAGEAL CANCER  PROCEDURE:  Procedure(s): VIDEO BRONCHOSCOPY (N/A) TRANSHIATAL TOTAL ESOPHAGECTOMY COMPLETE WITH CERVICAL ESOPHAGOGASTROSTOMY (N/A) FEEDING JEJUNOSTOMY (N/A) Pyloroplasty  SURGEON:  Surgeon(s) and Role:    * [name], MD - Primary  PHYSICIAN ASSISTANT:   Nicholes Rough, PA-C Jadene Pierini, PA-C  ANESTHESIA:   general  EBL:  450 mL   BLOOD ADMINISTERED:none  DRAINS: RIGHT AND LEFT STRAIGHT CHEST TUBE   LOCAL MEDICATIONS USED:  NONE  SPECIMEN:  Source of Specimen:  GE junction mass and esophagus   DISPOSITION OF SPECIMEN:  PATHOLOGY  COUNTS:  YES   DICTATION: .Dragon Dictation  PLAN OF CARE: Admit to inpatient   PATIENT DISPOSITION:  ICU - intubated and hemodynamically stable.   Delay start of Pharmacological VTE agent (>24hrs) due to surgical blood loss or risk of bleeding: yes

## 2018-11-25 NOTE — Procedures (Signed)
Extubation Procedure Note  Patient Details:   Name: [name] [name] DOB: 1950-07-13 MRN: [id number]   Airway Documentation:    Vent end date: 11/25/18 Vent end time: 2100   Evaluation  O2 sats: stable throughout Complications: No apparent complications Patient did tolerate procedure well. Bilateral Breath Sounds: Clear, Diminished   Yes  Carson Myrtle 11/25/2018, 9:17 PM

## 2018-11-25 NOTE — Anesthesia Procedure Notes (Signed)
Arterial Line Insertion Start/End6/01/2019 6:56 AM, 11/25/2018 6:56 AM Performed by: CRNA  Patient location: Pre-op. Preanesthetic checklist: patient identified, IV checked, site marked, risks and benefits discussed, surgical consent, monitors and equipment checked, pre-op evaluation, timeout performed and anesthesia consent Lidocaine 1% used for infiltration Left, radial was placed Catheter size: 20 G Hand hygiene performed , maximum sterile barriers used  and Seldinger technique used Allen's test indicative of satisfactory collateral circulation Attempts: 2 Procedure performed without using ultrasound guided technique. Following insertion, dressing applied and Biopatch. Post procedure assessment: normal  Patient tolerated the procedure well with no immediate complications.

## 2018-11-25 NOTE — Anesthesia Procedure Notes (Addendum)
Central Venous Catheter Insertion Performed by: Roberts Gaudy, MD, anesthesiologist Start/End6/01/2019 7:00 AM, 11/25/2018 7:10 AM Patient location: Pre-op. Preanesthetic checklist: patient identified, IV checked, site marked, risks and benefits discussed, surgical consent, monitors and equipment checked, pre-op evaluation, timeout performed and anesthesia consent Position: Trendelenburg Lidocaine 1% used for infiltration and patient sedated Hand hygiene performed , maximum sterile barriers used  and Seldinger technique used Catheter size: 8 Fr Total catheter length 16. Central line was placed.Double lumen Procedure performed using ultrasound guided technique. Ultrasound Notes:anatomy identified, needle tip was noted to be adjacent to the nerve/plexus identified, no ultrasound evidence of intravascular and/or intraneural injection and image(s) printed for medical record Attempts: 1 Following insertion, dressing applied, line sutured and Biopatch. Post procedure assessment: blood return through all ports  Patient tolerated the procedure well with no immediate complications.

## 2018-11-25 NOTE — Anesthesia Procedure Notes (Signed)
Procedure Name: Intubation Date/Time: 11/25/2018 7:41 AM Performed by: Renato Shin, CRNA Pre-anesthesia Checklist: Patient identified, Emergency Drugs available, Suction available and Patient being monitored Patient Re-evaluated:Patient Re-evaluated prior to induction Oxygen Delivery Method: Circle system utilized Preoxygenation: Pre-oxygenation with 100% oxygen Induction Type: IV induction and Rapid sequence Laryngoscope Size: Miller and 3 Grade View: Grade I Tube type: Oral (NIMS) Tube size: 8.0 mm Number of attempts: 1 Airway Equipment and Method: Stylet and Oral airway Placement Confirmation: ETT inserted through vocal cords under direct vision,  positive ETCO2 and breath sounds checked- equal and bilateral Tube secured with: Tape Dental Injury: Teeth and Oropharynx as per pre-operative assessment

## 2018-11-25 NOTE — Progress Notes (Signed)
NIF -20 FVC 1.0L

## 2018-11-25 NOTE — Progress Notes (Signed)
CT surgery p.m. Rounds  Status post transhiatal esophagectomy Patient intubated and sedated on Precedex Chest x-ray postop shows drains in good position Postop hematocrit 31%, blood pressure stable, sinus bradycardia

## 2018-11-26 ENCOUNTER — Encounter (HOSPITAL_COMMUNITY): Payer: Self-pay | Admitting: Cardiothoracic Surgery

## 2018-11-26 ENCOUNTER — Inpatient Hospital Stay (HOSPITAL_COMMUNITY): Payer: Medicare Other

## 2018-11-26 LAB — GLUCOSE, CAPILLARY
Glucose-Capillary: 117 mg/dL — ABNORMAL HIGH (ref 70–99)
Glucose-Capillary: 118 mg/dL — ABNORMAL HIGH (ref 70–99)
Glucose-Capillary: 121 mg/dL — ABNORMAL HIGH (ref 70–99)
Glucose-Capillary: 125 mg/dL — ABNORMAL HIGH (ref 70–99)
Glucose-Capillary: 130 mg/dL — ABNORMAL HIGH (ref 70–99)
Glucose-Capillary: 134 mg/dL — ABNORMAL HIGH (ref 70–99)

## 2018-11-26 LAB — POCT I-STAT 7, (LYTES, BLD GAS, ICA,H+H)
Acid-base deficit: 1 mmol/L (ref 0.0–2.0)
Bicarbonate: 22.7 mmol/L (ref 20.0–28.0)
Calcium, Ion: 1.23 mmol/L (ref 1.15–1.40)
HCT: 34 % — ABNORMAL LOW (ref 39.0–52.0)
Hemoglobin: 11.6 g/dL — ABNORMAL LOW (ref 13.0–17.0)
O2 Saturation: 97 %
Patient temperature: 36.4
Potassium: 4.2 mmol/L (ref 3.5–5.1)
Sodium: 134 mmol/L — ABNORMAL LOW (ref 135–145)
TCO2: 24 mmol/L (ref 22–32)
pCO2 arterial: 33.2 mmHg (ref 32.0–48.0)
pH, Arterial: 7.44 (ref 7.350–7.450)
pO2, Arterial: 83 mmHg (ref 83.0–108.0)

## 2018-11-26 LAB — CBC
HCT: 34.7 % — ABNORMAL LOW (ref 39.0–52.0)
Hemoglobin: 12.1 g/dL — ABNORMAL LOW (ref 13.0–17.0)
MCH: 30.6 pg (ref 26.0–34.0)
MCHC: 34.9 g/dL (ref 30.0–36.0)
MCV: 87.8 fL (ref 80.0–100.0)
Platelets: 177 10*3/uL (ref 150–400)
RBC: 3.95 MIL/uL — ABNORMAL LOW (ref 4.22–5.81)
RDW: 13.1 % (ref 11.5–15.5)
WBC: 9.7 10*3/uL (ref 4.0–10.5)
nRBC: 0 % (ref 0.0–0.2)

## 2018-11-26 LAB — BASIC METABOLIC PANEL
Anion gap: 7 (ref 5–15)
BUN: 15 mg/dL (ref 8–23)
CO2: 24 mmol/L (ref 22–32)
Calcium: 8.4 mg/dL — ABNORMAL LOW (ref 8.9–10.3)
Chloride: 100 mmol/L (ref 98–111)
Creatinine, Ser: 1.35 mg/dL — ABNORMAL HIGH (ref 0.61–1.24)
GFR calc Af Amer: >60 mL/min (ref >60)
GFR calc non Af Amer: 53 mL/min — ABNORMAL LOW (ref >60)
Glucose, Bld: 135 mg/dL — ABNORMAL HIGH (ref 70–99)
Potassium: 4.1 mmol/L (ref 3.5–5.1)
Sodium: 131 mmol/L — ABNORMAL LOW (ref 135–145)

## 2018-11-26 LAB — BPAM RBC
Blood Product Expiration Date: 202006242359
ISSUE DATE / TIME: 202006081438
Unit Type and Rh: 6200

## 2018-11-26 LAB — TYPE AND SCREEN
ABO/RH(D): A POS
Antibody Screen: NEGATIVE
Unit division: 0

## 2018-11-26 MED ORDER — MORPHINE SULFATE 2 MG/ML IV SOLN
INTRAVENOUS | Status: AC
  Administered 2018-11-26: 14:00:00 via INTRAVENOUS
  Administered 2018-11-26: 6 mg via INTRAVENOUS
  Administered 2018-11-26: 3 mg via INTRAVENOUS
  Administered 2018-11-27: 09:00:00 via INTRAVENOUS
  Administered 2018-11-27 (×2): 12 mg via INTRAVENOUS
  Administered 2018-11-27: 4.5 mL via INTRAVENOUS
  Administered 2018-11-27: 7.5 mg via INTRAVENOUS
  Administered 2018-11-27: 6 mg via INTRAVENOUS
  Administered 2018-11-28: 9 mg via INTRAVENOUS
  Administered 2018-11-28: 7.5 mg via INTRAVENOUS
  Administered 2018-11-28: 4.5 mg via INTRAVENOUS
  Administered 2018-11-28: 05:00:00 via INTRAVENOUS
  Administered 2018-11-28: 18 mg via INTRAVENOUS
  Administered 2018-11-28: 10.5 mg via INTRAVENOUS
  Administered 2018-11-28: 7.5 mg via INTRAVENOUS
  Administered 2018-11-29: 1.5 mg via INTRAVENOUS
  Administered 2018-11-29: 7.5 mg via INTRAVENOUS
  Administered 2018-11-29: 7.35 mg via INTRAVENOUS
  Administered 2018-11-29 – 2018-11-30 (×2): 3 mg via INTRAVENOUS
  Administered 2018-11-30: 4.5 mg via INTRAVENOUS
  Filled 2018-11-26 (×6): qty 30

## 2018-11-26 MED ORDER — DIPHENHYDRAMINE HCL 12.5 MG/5ML PO ELIX
12.5000 mg | ORAL_SOLUTION | Freq: Four times a day (QID) | ORAL | Status: DC | PRN
  Filled 2018-11-26: qty 5

## 2018-11-26 MED ORDER — SODIUM CHLORIDE 0.9% FLUSH: 9.0000 mL | INTRAVENOUS | Status: DC | PRN

## 2018-11-26 MED ORDER — ONDANSETRON HCL 4 MG/2ML IJ SOLN: 4.0000 mg | Freq: Four times a day (QID) | INTRAMUSCULAR | Status: DC | PRN

## 2018-11-26 MED ORDER — DIPHENHYDRAMINE HCL 50 MG/ML IJ SOLN
12.5000 mg | Freq: Four times a day (QID) | INTRAMUSCULAR | Status: DC | PRN
  Administered 2018-12-06 – 2018-12-09 (×4): 12.5 mg via INTRAVENOUS
  Filled 2018-11-26 (×5): qty 1

## 2018-11-26 MED ORDER — ENOXAPARIN SODIUM 40 MG/0.4ML ~~LOC~~ SOLN
40.0000 mg | SUBCUTANEOUS | Status: DC
  Administered 2018-11-26 – 2018-12-17 (×21): 40 mg via SUBCUTANEOUS
  Filled 2018-11-26 (×21): qty 0.4

## 2018-11-26 MED ORDER — NALOXONE HCL 0.4 MG/ML IJ SOLN: 0.4000 mg | INTRAMUSCULAR | Status: DC | PRN

## 2018-11-26 NOTE — Progress Notes (Signed)
Nicholes Rough PA paged regarding radiology report about a 10-20% pneumothorax on the left. PA will tell Dr. Servando Snare. RN will continue to monitor.

## 2018-11-26 NOTE — Progress Notes (Addendum)
[address]       [place] [address]             [phone]      1 Day Post-Op Procedure(s) (LRB): VIDEO BRONCHOSCOPY (N/A) TRANSHIATAL TOTAL ESOPHAGECTOMY COMPLETE WITH CERVICAL ESOPHAGOGASTROSTOMY (N/A) FEEDING JEJUNOSTOMY (N/A) Pyloroplasty Subjective:   Objective: Vital signs in last 24 hours: Temp:  [97.5 F (36.4 C)-98.1 F (36.7 C)] 97.5 F (36.4 C) (06/09 0500) Pulse Rate:  [44-98] 84 (06/09 0645) Cardiac Rhythm: Normal sinus rhythm (06/09 0500) Resp:  [10-31] 23 (06/09 0645) BP: (107-145)/(56-91) 117/65 (06/09 0600) SpO2:  [97 %-100 %] 99 % (06/09 0645) Arterial Line BP: (70-173)/(51-101) 80/67 (06/09 0645) FiO2 (%):  [40 %-50 %] 40 % (06/08 2021) Weight:  [73.2 kg] 73.2 kg (06/09 0540)      Intake/Output from previous day: 06/08 0701 - 06/09 0700 In: 7850.5 [I.V.:5945.5; Blood:315; NG/GT:10; IV Piggyback:1550] Out: 1960 [TDVVO:1607; Blood:450; Chest Tube:290] Intake/Output this shift: No intake/output data recorded.  General appearance: alert, cooperative and no distress Heart: regular rate and rhythm, S1, S2 normal, no murmur, click, rub or gallop Lungs: clear to auscultation bilaterally and diminished in the lower lobes Abdomen: diminished bowel sounds Extremities: extremities normal, atraumatic, no cyanosis or edema Wound: clean and dry dressed with sterile dressing  Lab Results: Recent Labs    11/25/18 1559  11/26/18 0511 11/26/18 0609  WBC 7.9  --   --  9.7  HGB 11.2*   < > 11.6* 12.1*  HCT 32.5*   < > 34.0* 34.7*  PLT 173  --   --  177   < > = values in this interval not displayed.   BMET:  Recent Labs    11/25/18 1559  11/26/18 0511 11/26/18 0609  NA 133*   < > 134* 131*  K 4.8   < > 4.2 4.1  CL 103  --   --  100  CO2 22  --   --  24  GLUCOSE 157*  --   --  135*  BUN 21  --   --  15  CREATININE 1.10  --   --  1.35*  CALCIUM 8.0*  --   --  8.4*   < > = values in this interval not displayed.    PT/INR:  No results for input(s): LABPROT, INR in the last 72 hours. ABG    Component Value Date/Time   PHART 7.440 11/26/2018 0511   HCO3 22.7 11/26/2018 0511   TCO2 24 11/26/2018 0511   ACIDBASEDEF 1.0 11/26/2018 0511   O2SAT 97.0 11/26/2018 0511   CBG (last 3)  Recent Labs    11/25/18 1946 11/25/18 2342 11/26/18 0412  GLUCAP 164* 157* 117*    Assessment/Plan: S/P Procedure(s) (LRB): VIDEO BRONCHOSCOPY (N/A) TRANSHIATAL TOTAL ESOPHAGECTOMY COMPLETE WITH CERVICAL ESOPHAGOGASTROSTOMY (N/A) FEEDING JEJUNOSTOMY (N/A) Pyloroplasty  1. CV-NSR in the 80s, BP well controlled. Discontinue arterial line.  2. Pulm-on 4L [place]. Working on Chiropodist but only pulling 250. Continue chest tubes for now. CXR reviewed and appears stable.  3. Renal-creatinine 1.35, . Avoid nephrotoxic agents. Baseline 0.80 4. Endo-blood glucose well controlled.  5. H and H stable  Plan: Ordered a PCA for better pain control. OOB to chair. Remove arterial line. Keep chest tubes. Working on [employer] and incentive spirometer.     LOS: 1 day    [name] [name] 11/26/2018  Patient awake and alert. Voice is good , can say "e" without  difficulty Small apical ptx on the left  Cr 1.35 ( yesterday 1.1) monitor uop Add Lovenox for DVT prophylaxis  Expected Acute  Blood - loss Anemia- continue to monitor   I have seen and examined [name] [name] and agree with the above assessment  and plan.  [name] MD Beeper [phone] Office [phone] 11/26/2018 3:26 PM

## 2018-11-26 NOTE — Op Note (Signed)
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  SURGICAL PROCEDURE:  Video bronchoscopy, transhiatal total esophagectomy with cervical esophagogastrostomy, pyloroplasty, feeding jejunostomy, bilateral placement of chest tubes.  SURGEON:  [name], MD  FIRST ASSISTANT:  Nicholes Rough, PA-C  SECOND ASSISTANT:  Jadene Pierini, PA-C  BRIEF HISTORY:  The patient is a 68 year old male who had anemia and was evaluated and found to have a large distal esophageal ulcer which showed adenocarcinoma.  Further evaluation showed a bulky mass at the GE junction indicating probable stage III  adenocarcinoma of the distal esophagus and GE junction.  The patient underwent a course of radiation and chemotherapy.  Followup CT scan did not show any further distal or distant metastases and some response to therapy.  The patient was referred for  consideration of esophagectomy.  Risks and options were discussed with the patient in detail, and he was agreeable and signed informed consent.  DESCRIPTION OF PROCEDURE:  The patient underwent general endotracheal anesthesia without incident.  A single-lumen NIMS tube was placed.  Appropriate timeout was performed, and through this, we performed a video bronchoscopy to the subsegmental level.   There was no evidence of tracheal involvement of tumor, and the NIMS tube was in good position.  We then turned the neck slightly to the right.  The left neck, chest and abdomen were prepped with Betadine, draped in a sterile manner.  A second timeout  was performed, and we proceeded initially with an upper midline  incision and explored the abdomen.  There was no evidence of serosal spread of tumor.  There was a firm nodular area in the left lobe of the liver, which corresponds to what was thought to  be liver cysts on previous CT scans.  This area was biopsied and was in fact a cyst.  The wall of the cyst was frozen, and there was no evidence of tumor.  We then turned our attention to the GE junction area where there was a bulky mass involving the GE  junction and extending up into the chest.  The esophagus at this point was encircled with a Penrose drain.  We then divided the short gastric vessels, freeing the fundus of the stomach.  The stomach was then elevated, and we entered the lesser sac,  identifying the coronary vein and left gastric artery.  There was a significant amount of fibrotic tissue around the left gastric artery.  With these isolated, a bulldog was placed on the left gastric artery, and with this, there was preservation of  easily palpable pulse in the right gastroepiploic vessel.  We then proceeded with the Harmonic scalpel, opening up the hiatus further and started a transhiatal esophagectomy in the upper portion bluntly.  The lower portion was under direct vision using a  Harmonic scalpel, dividing small bleeders and tissue.  With the esophagus freed as much as possible, we then moved to the left neck where an incision was made along the anterior border of the left sternocleidomastoid muscle through the platysma.  Strap  muscles were divided.  The carotid artery and jugular vein were retracted laterally and dissection carried down to the prevertebral fascia.  Using the NIMS nerve stimulator, we were able to isolate the recurrent  nerve and preserving this without putting  any firm retractors into the neck and circled the cervical esophagus with a Penrose drain.  Then, working from above and below, we completed freeing the esophagus.  The NG tube was retracted into the cervical esophagus.  The  esophagus was divided with a  GIA stapler and the specimen delivered into the abdomen.  Using a 55 linear stapler, a gastric tube was created, taking significant   margin of the stomach, the gastric tube was created.  The specimen was submitted to pathology, oriented as to the lesser  curve.  The staple line was then oversewn with a running 4-0 Prolene.  While waiting for pathology results, we then proceeded with kocherizing the duodenum and performed a pyloroplasty with near closure of the pyloroplasty with interrupted 4-0 Vicryl  sutures and a second layer of 3-0 silk sutures.  We then had an extensive discussion with the pathologist as a total of 13 areas along the staple line were sampled and 3 were positively identified along the lesser curve and what would have been the  midportion of the gastric tube.  We then used the GIA stapler and stapled along the midportion of the gastric tube to obtain a new margin.  With the original specimen, the stomach was opened in the operating room, and it appeared there was no gross tumor  along the suture line or staple line.  The stapled margin was resewn with a 4-0 Prolene suture.  At this point, we did not feel we could take additional portions of the stomach without compromising the gastric tube.  The specimen was then placed in a  telescope bag, and using guidance from a Foley catheter attached to the bag, the specimen in the gastric tube was delivered through the cervical incision.  Previously, the site to open the stomach had been marked, taking care not to twist it as it was  delivered to the neck.  The cervical esophagus was then tacked to the fundus of the stomach with four 3-0 silk sutures.  The cervical esophagus staple line was opened, and an opening in the fundus of the stomach was created using a 30 mm Endo-GIA stapler  with 1 limb in the cervical esophagus and 1 in the fundus.  The posterior anastomotic staple line was created before stapling of the  mucosa.  Mucosal apposition was obtained with interrupted Vicryl sutures.  The NG tube was passed back across the  anastomosis into the upper abdomen.  The anterior suture line was then closed with interrupted 4-0 Vicryl sutures and interrupted 3-0 silk sutures, and the anastomosis was then positioned in the posterior neck.  The esophageal hiatus was then tacked to  the stomach to prevent herniation into the chest.  The diaphragms were bulging slightly, so we placed bilateral chest tubes with minimal return of any fluid.  We then identified the ligament of Treitz, and on the antimesenteric border of the jejunum  placed a pursestring silk suture.  Through this, a jejunostomy feeding tube was placed antimesenteric margin of  the bowel and brought out through a separate site on the left abdominal wall and secured in place.  The bowel was tacked to the abdominal wall to prevent  torsion.  With the sponge and needle count reported as correct, the abdomen was closed with #1 PDS suture, running 2-0 Vicryl, and 3-0 subcuticular stitch.  The neck incision was closed with a Penrose drain in the vicinity of the anastomosis and brought  out through a separate site in the left lateral neck using interrupted 3-0 Vicryl sutures.  The deep layers in the neck were reapproximated.  Running 3-0 Vicryl subcuticular stitch was used to close the skin edges.  Dry dressings were applied.  Sponge  and needle count was reported as correct at the completion of the procedure.  Estimated blood loss was between 350 and 400 mL.  The patient was given 1 unit of packed cells as his starting hematocrit was 27.  He tolerated the procedure without obvious  complication and was transferred intubated to the surgical intensive care unit for further postoperative care.  LN/NUANCE  D:11/26/2018 T:11/26/2018 JOB:[id number]

## 2018-11-26 NOTE — Progress Notes (Signed)
Patient ID: [name] [name], male   DOB: 1950-09-21, 68 y.o.   MRN: [id number] [facility] Evening Rounds  Hemodynamically stable in sinus rhythm. sats 95% on 2L [place]  Urine output good.  Chest tube output low.  No problems tonight

## 2018-11-27 ENCOUNTER — Inpatient Hospital Stay (HOSPITAL_COMMUNITY): Payer: Medicare Other

## 2018-11-27 DIAGNOSIS — E440 Moderate protein-calorie malnutrition: Secondary | ICD-10-CM

## 2018-11-27 LAB — BASIC METABOLIC PANEL
Anion gap: 6 (ref 5–15)
BUN: 9 mg/dL (ref 8–23)
CO2: 25 mmol/L (ref 22–32)
Calcium: 8.3 mg/dL — ABNORMAL LOW (ref 8.9–10.3)
Chloride: 101 mmol/L (ref 98–111)
Creatinine, Ser: 0.72 mg/dL (ref 0.61–1.24)
GFR calc Af Amer: >60 mL/min (ref >60)
GFR calc non Af Amer: >60 mL/min (ref >60)
Glucose, Bld: 140 mg/dL — ABNORMAL HIGH (ref 70–99)
Potassium: 4.1 mmol/L (ref 3.5–5.1)
Sodium: 132 mmol/L — ABNORMAL LOW (ref 135–145)

## 2018-11-27 LAB — GLUCOSE, CAPILLARY
Glucose-Capillary: 105 mg/dL — ABNORMAL HIGH (ref 70–99)
Glucose-Capillary: 105 mg/dL — ABNORMAL HIGH (ref 70–99)
Glucose-Capillary: 117 mg/dL — ABNORMAL HIGH (ref 70–99)
Glucose-Capillary: 124 mg/dL — ABNORMAL HIGH (ref 70–99)
Glucose-Capillary: 132 mg/dL — ABNORMAL HIGH (ref 70–99)
Glucose-Capillary: 94 mg/dL (ref 70–99)

## 2018-11-27 LAB — CBC
HCT: 33.1 % — ABNORMAL LOW (ref 39.0–52.0)
Hemoglobin: 11.2 g/dL — ABNORMAL LOW (ref 13.0–17.0)
MCH: 30.9 pg (ref 26.0–34.0)
MCHC: 33.8 g/dL (ref 30.0–36.0)
MCV: 91.2 fL (ref 80.0–100.0)
Platelets: 176 10*3/uL (ref 150–400)
RBC: 3.63 MIL/uL — ABNORMAL LOW (ref 4.22–5.81)
RDW: 13.6 % (ref 11.5–15.5)
WBC: 12.3 10*3/uL — ABNORMAL HIGH (ref 4.0–10.5)
nRBC: 0 % (ref 0.0–0.2)

## 2018-11-27 LAB — MAGNESIUM
Magnesium: 2 mg/dL (ref 1.7–2.4)
Magnesium: 2.1 mg/dL (ref 1.7–2.4)

## 2018-11-27 LAB — PHOSPHORUS
Phosphorus: 1.9 mg/dL — ABNORMAL LOW (ref 2.5–4.6)
Phosphorus: 1.9 mg/dL — ABNORMAL LOW (ref 2.5–4.6)

## 2018-11-27 MED ORDER — OSMOLITE 1.5 CAL PO LIQD
1000.0000 mL | ORAL | Status: DC
  Administered 2018-11-27: 1000 mL
  Filled 2018-11-27: qty 1000

## 2018-11-27 MED ORDER — PRO-STAT SUGAR FREE PO LIQD
30.0000 mL | Freq: Two times a day (BID) | ORAL | Status: DC
  Administered 2018-11-27 – 2018-12-18 (×43): 30 mL
  Filled 2018-11-27 (×43): qty 30

## 2018-11-27 NOTE — Progress Notes (Signed)
Initial Nutrition Assessment  DOCUMENTATION CODES:   Non-severe (moderate) malnutrition in context of chronic illness  INTERVENTION:   Tube feeding:  -Osmolite 1.5 @ 20 ml/hr via J-tube -Increase per surgery to goal rate of 60 ml/hr (1440 ml) -30 ml Prostat BID  At goal rate TF provides: 2360 kcals, 120 grams protein, 1097 ml free water. Meets 100% of needs.   NUTRITION DIAGNOSIS:   Moderate Malnutrition related to chronic illness, cancer and cancer related treatments as evidenced by mild muscle depletion, energy intake < 75% for > or equal to 1 month   GOAL:   Patient will meet greater than or equal to 90% of their needs  MONITOR:   Diet advancement, Skin, TF tolerance, Weight trends, Labs, I & O's  REASON FOR ASSESSMENT:   Consult Assessment of nutrition requirement/status, Enteral/tube feeding initiation and management  ASSESSMENT:   Patient with PMH significant for diverticulitis, HLD, HTN, and adenocarcinoma of GE junction (s/p chemotherapy/radiation). Presents this admission for surgical resection of esophageal cancer.    6/8- total esophagectomy with cervical esophagogastrostomy, J-tube placement   Spoke with pt at bedside. Reports his appetite has been off/on since the beginning of the year due to pain associated with eating. States during this time he would force himself to eat 2-3 meals daily that consisted of eggs, toast, ice cream, and certain meats. He could not tolerated hard to digest foods like fresh fruits and vegetables. Over the last couple months, water became hard to swallow. Discussed plan regarding tube feeding. RD to start trickles and increase per surgery.   Pt endorses a UBW of 175 lb and an unintentional wt loss of 25 lb in the last 6 months. Records indicate pt weighed 80.3 kg in Jan and 73.7 kg this admission (8.2% wt loss in 6 months, insignificant for time frame). Pt endorses that his clothes have fit loosely over the last couple of months.    I/O: +7,411 ml since admit UOP: 950 ml x 24 hrs NGT: 150 ml x 24 hrs  Chest tube: 140 ml x 24 hrs   Drips: D5 in 1/2NS @ 75 ml/hr Medications: SS novolog Labs: Na 132 (L) CBG [phone]  NUTRITION - FOCUSED PHYSICAL EXAM:    Most Recent Value  Orbital Region  No depletion  Upper Arm Region  Mild depletion  Thoracic and Lumbar Region  Unable to assess  Buccal Region  No depletion  Temple Region  Mild depletion  Clavicle Bone Region  Mild depletion  Clavicle and Acromion Bone Region  Mild depletion  Scapular Bone Region  Unable to assess  Dorsal Hand  No depletion  Patellar Region  Mild depletion  Anterior Thigh Region  Mild depletion  Posterior Calf Region  Mild depletion  Edema (RD Assessment)  None  Hair  Reviewed  Eyes  Reviewed  Mouth  Reviewed  Skin  Reviewed  Nails  Reviewed     Diet Order:   Diet Order            Diet NPO time specified  Diet effective now              EDUCATION NEEDS:   Education needs have been addressed  Skin:  Skin Assessment: Skin Integrity Issues: Skin Integrity Issues:: Incisions Incisions: left neck, L/R chest, abdomen   Last BM:  PTA  Height:   Ht Readings from Last 1 Encounters:  11/25/18 5\' 11"  (1.803 m)    Weight:   Wt Readings from Last 1 Encounters:  11/27/18 73.7 kg    Ideal Body Weight:  78.2 kg  BMI:  Body mass index is 22.66 kg/m.  Estimated Nutritional Needs:   Kcal:  2200-2400 kcal  Protein:  110-130 grams  Fluid:  >/= 2L/day   Mariana Single RD, LDN Clinical Nutrition Pager # - [phone]

## 2018-11-27 NOTE — Progress Notes (Signed)
      [address]       [place] [address]             [phone]      POD # 2 esophagectomy  BP 137/81   Pulse (!) 108   Temp 99.5 F (37.5 C) (Oral)   Resp (!) 28   Ht 5\' 11"  (1.803 m)   Wt 73.7 kg   SpO2 100%   BMI 22.66 kg/m   Intake/Output Summary (Last 24 hours) at 11/27/2018 1813 Last data filed at 11/27/2018 1800 Gross per 24 hour  Intake 2377.85 ml  Output 625 ml  Net 1752.85 ml   CBG well controlled  Continue current care  Airlie Blumenberg C. Roxan Hockey, MD [facility] [phone]

## 2018-11-27 NOTE — Progress Notes (Addendum)
TCTS DAILY ICU PROGRESS NOTE                   [address]            [place] [address]          [phone]   2 Days Post-Op Procedure(s) (LRB): VIDEO BRONCHOSCOPY (N/A) TRANSHIATAL TOTAL ESOPHAGECTOMY COMPLETE WITH CERVICAL ESOPHAGOGASTROSTOMY (N/A) FEEDING JEJUNOSTOMY (N/A) Pyloroplasty  Total Length of Stay:  LOS: 2 days   Subjective: Feels okay this morning. Having some pain near his abdominal incision.   Objective: Vital signs in last 24 hours: Temp:  [97.4 F (36.3 C)-98.8 F (37.1 C)] 98.8 F (37.1 C) (06/10 0700) Pulse Rate:  [56-97] 77 (06/10 0800) Cardiac Rhythm: Normal sinus rhythm (06/10 0800) Resp:  [11-36] 18 (06/10 0800) BP: (106-145)/(61-93) 117/71 (06/10 0800) SpO2:  [89 %-100 %] 95 % (06/10 0800) Arterial Line BP: (93-105)/(57-83) 105/83 (06/09 1000) Weight:  [73.7 kg] 73.7 kg (06/10 0600)  Filed Weights   11/26/18 0540 11/27/18 0600  Weight: 73.2 kg 73.7 kg    Weight change: 0.5 kg   Hemodynamic parameters for last 24 hours:    Intake/Output from previous day: 06/09 0701 - 06/10 0700 In: 2678.3 [I.V.:2418.3; IV Piggyback:200] Out: 1240 [Urine:950; Emesis/NG output:150; Chest Tube:140]  Intake/Output this shift: Total I/O In: 87.4 [I.V.:87.4] Out: 90 [Urine:40; Chest Tube:50]  Current Meds: Scheduled Meds: . Chlorhexidine Gluconate Cloth  6 each Topical Daily  . Chlorhexidine Gluconate Cloth  6 each Topical Q0600  . Chlorhexidine Gluconate Cloth  6 each Topical Daily  . enoxaparin (LOVENOX) injection  40 mg Subcutaneous Q24H  . insulin aspart  0-24 Units Subcutaneous Q4H  . mouth rinse  15 mL Mouth Rinse BID  . morphine   Intravenous Q4H  . pantoprazole (PROTONIX) IV  40 mg Intravenous Q12H  . sodium chloride flush  10-40 mL Intracatheter Q12H   Continuous Infusions: . dexmedetomidine (PRECEDEX) IV infusion Stopped (11/25/18 1939)  . dextrose 5 % and 0.45% NaCl 100 mL/hr at 11/27/18 0800  . potassium chloride      PRN Meds:.albuterol, diphenhydrAMINE **OR** diphenhydrAMINE, morphine injection, naloxone **AND** sodium chloride flush, ondansetron (ZOFRAN) IV, ondansetron (ZOFRAN) IV, potassium chloride, sodium chloride flush  General appearance: alert, cooperative and no distress Heart: regular rate and rhythm, S1, S2 normal, no murmur, click, rub or gallop Lungs: clear to auscultation bilaterally Abdomen: soft, non-tender; bowel sounds normal; no masses,  no organomegaly Extremities: extremities normal, atraumatic, no cyanosis or edema Wound: clean and dry dressed with a sterile dressing  Lab Results: CBC: Recent Labs    11/26/18 0609 11/27/18 0416  WBC 9.7 12.3*  HGB 12.1* 11.2*  HCT 34.7* 33.1*  PLT 177 176   BMET:  Recent Labs    11/26/18 0609 11/27/18 0416  NA 131* 132*  K 4.1 4.1  CL 100 101  CO2 24 25  GLUCOSE 135* 140*  BUN 15 9  CREATININE 1.35* 0.72  CALCIUM 8.4* 8.3*    CMET: Lab Results  Component Value Date   WBC 12.3 (H) 11/27/2018   HGB 11.2 (L) 11/27/2018   HCT 33.1 (L) 11/27/2018   PLT 176 11/27/2018   GLUCOSE 140 (H) 11/27/2018   CHOL 161 05/06/2018   TRIG 134.0 05/06/2018   HDL 54.20 05/06/2018   LDLDIRECT 131.7 12/27/2009   LDLCALC 80 05/06/2018   ALT 14 11/22/2018   AST 18 11/22/2018   NA 132 (L) 11/27/2018   K 4.1 11/27/2018  CL 101 11/27/2018   CREATININE 0.72 11/27/2018   BUN 9 11/27/2018   CO2 25 11/27/2018   TSH 1.24 05/06/2018   PSA 1.49 05/06/2018   INR 1.1 11/22/2018   HGBA1C 5.4 05/06/2018   MICROALBUR 1.1 02/25/2013      PT/INR: No results for input(s): LABPROT, INR in the last 72 hours. Radiology: No results found.   Assessment/Plan: S/P Procedure(s) (LRB): VIDEO BRONCHOSCOPY (N/A) TRANSHIATAL TOTAL ESOPHAGECTOMY COMPLETE WITH CERVICAL ESOPHAGOGASTROSTOMY (N/A) FEEDING JEJUNOSTOMY (N/A) Pyloroplasty  1. CV-NSR in the 80s, BP well controlled. Discontinue arterial line.  2. Pulm-on 2L [place]. Working on Chiropodist  but only pulling 750. CXR this morning showed Left basilar changes as well as left-sided pneumothorax.Tubes and lines as described. Keep chest tubes for now 3. Renal-creatinine 0.72, now back to baseline 4. Endo-blood glucose well controlled.  5. H and H stable 6. Lovenox for DVT prophylaxis   Plan: Continue to work on Chiropodist. Continue to wean oxygen as tolerated.    [name] [name] 11/27/2018 8:08 AM   Up to chair this am On pca pump Will start trickle tube feeding  D/c right chest tube  Cr at baseline I have seen and examined [name] [name] and agree with the above assessment  and plan.  [name] MD Beeper [phone] Office [phone] 11/27/2018 8:42 AM

## 2018-11-28 ENCOUNTER — Inpatient Hospital Stay (HOSPITAL_COMMUNITY): Payer: Medicare Other

## 2018-11-28 LAB — GLUCOSE, CAPILLARY
Glucose-Capillary: 103 mg/dL — ABNORMAL HIGH (ref 70–99)
Glucose-Capillary: 116 mg/dL — ABNORMAL HIGH (ref 70–99)
Glucose-Capillary: 130 mg/dL — ABNORMAL HIGH (ref 70–99)
Glucose-Capillary: 95 mg/dL (ref 70–99)
Glucose-Capillary: 97 mg/dL (ref 70–99)
Glucose-Capillary: 98 mg/dL (ref 70–99)

## 2018-11-28 LAB — BASIC METABOLIC PANEL
Anion gap: 7 (ref 5–15)
BUN: 11 mg/dL (ref 8–23)
CO2: 24 mmol/L (ref 22–32)
Calcium: 8.3 mg/dL — ABNORMAL LOW (ref 8.9–10.3)
Chloride: 101 mmol/L (ref 98–111)
Creatinine, Ser: 0.56 mg/dL — ABNORMAL LOW (ref 0.61–1.24)
GFR calc Af Amer: >60 mL/min (ref >60)
GFR calc non Af Amer: >60 mL/min (ref >60)
Glucose, Bld: 126 mg/dL — ABNORMAL HIGH (ref 70–99)
Potassium: 3.7 mmol/L (ref 3.5–5.1)
Sodium: 132 mmol/L — ABNORMAL LOW (ref 135–145)

## 2018-11-28 LAB — CBC
HCT: 33.2 % — ABNORMAL LOW (ref 39.0–52.0)
Hemoglobin: 11.1 g/dL — ABNORMAL LOW (ref 13.0–17.0)
MCH: 30.6 pg (ref 26.0–34.0)
MCHC: 33.4 g/dL (ref 30.0–36.0)
MCV: 91.5 fL (ref 80.0–100.0)
Platelets: 190 10*3/uL (ref 150–400)
RBC: 3.63 MIL/uL — ABNORMAL LOW (ref 4.22–5.81)
RDW: 13.3 % (ref 11.5–15.5)
WBC: 10.4 10*3/uL (ref 4.0–10.5)
nRBC: 0 % (ref 0.0–0.2)

## 2018-11-28 LAB — PHOSPHORUS
Phosphorus: 2.1 mg/dL — ABNORMAL LOW (ref 2.5–4.6)
Phosphorus: 2.3 mg/dL — ABNORMAL LOW (ref 2.5–4.6)

## 2018-11-28 LAB — MAGNESIUM
Magnesium: 2.2 mg/dL (ref 1.7–2.4)
Magnesium: 2 mg/dL (ref 1.7–2.4)

## 2018-11-28 MED ORDER — OSMOLITE 1.5 CAL PO LIQD
1000.0000 mL | ORAL | Status: DC
  Administered 2018-11-28: 1000 mL
  Filled 2018-11-28: qty 1000

## 2018-11-28 NOTE — Progress Notes (Signed)
Patient ID: [name] [name], male   DOB: [birth year], 68 y.o.   MRN: [id number] TCTS DAILY ICU PROGRESS NOTE                   [address]            [place] [address]          [phone]   3 Days Post-Op Procedure(s) (LRB): VIDEO BRONCHOSCOPY (N/A) TRANSHIATAL TOTAL ESOPHAGECTOMY COMPLETE WITH CERVICAL ESOPHAGOGASTROSTOMY (N/A) FEEDING JEJUNOSTOMY (N/A) Pyloroplasty  Total Length of Stay:  LOS: 3 days   Subjective: Unable to void this am   Objective: Vital signs in last 24 hours: Temp:  [98.4 F (36.9 C)-100.5 F (38.1 C)] 98.4 F (36.9 C) (06/11 0822) Pulse Rate:  [45-108] 88 (06/11 0700) Cardiac Rhythm: Normal sinus rhythm (06/11 0700) Resp:  [10-28] 15 (06/11 0700) BP: (100-145)/(62-81) 105/68 (06/11 0700) SpO2:  [52 %-100 %] 99 % (06/11 0700)  Filed Weights   11/26/18 0540 11/27/18 0600  Weight: 73.2 kg 73.7 kg    Weight change:    Hemodynamic parameters for last 24 hours:    Intake/Output from previous day: 06/10 0701 - 06/11 0700 In: 2169.9 [I.V.:1835.3; NG/GT:334.7] Out: 1085 [Urine:555; Emesis/NG output:120; Chest Tube:410]  Intake/Output this shift: No intake/output data recorded.  Current Meds: Scheduled Meds: . Chlorhexidine Gluconate Cloth  6 each Topical Daily  . enoxaparin (LOVENOX) injection  40 mg Subcutaneous Q24H  . feeding supplement (PRO-STAT SUGAR FREE 64)  30 mL Per Tube BID  . insulin aspart  0-24 Units Subcutaneous Q4H  . mouth rinse  15 mL Mouth Rinse BID  . morphine   Intravenous Q4H  . pantoprazole (PROTONIX) IV  40 mg Intravenous Q12H  . sodium chloride flush  10-40 mL Intracatheter Q12H   Continuous Infusions: . dexmedetomidine (PRECEDEX) IV infusion Stopped (11/25/18 1939)  . dextrose 5 % and 0.45% NaCl 75 mL/hr at 11/28/18 0357  . feeding supplement (OSMOLITE 1.5 CAL) 1,000 mL (11/27/18 1316)  . potassium chloride     PRN Meds:.diphenhydrAMINE **OR** diphenhydrAMINE, morphine injection, naloxone  **AND** sodium chloride flush, ondansetron (ZOFRAN) IV, ondansetron (ZOFRAN) IV, potassium chloride, sodium chloride flush  General appearance: alert, cooperative and no distress Neurologic: intact Heart: regular rate and rhythm, S1, S2 normal, no murmur, click, rub or gallop Lungs: diminished breath sounds bibasilar Abdomen: soft, non-tender; bowel sounds normal; no masses,  no organomegaly Extremities: extremities normal, atraumatic, no cyanosis or edema and Homans sign is negative, no sign of DVT Wound: intact no neck drainage   Lab Results: CBC: Recent Labs    11/27/18 0416 11/28/18 0449  WBC 12.3* 10.4  HGB 11.2* 11.1*  HCT 33.1* 33.2*  PLT 176 190   BMET:  Recent Labs    11/27/18 0416 11/28/18 0449  NA 132* 132*  K 4.1 3.7  CL 101 101  CO2 25 24  GLUCOSE 140* 126*  BUN 9 11  CREATININE 0.72 0.56*  CALCIUM 8.3* 8.3*    CMET: Lab Results  Component Value Date   WBC 10.4 11/28/2018   HGB 11.1 (L) 11/28/2018   HCT 33.2 (L) 11/28/2018   PLT 190 11/28/2018   GLUCOSE 126 (H) 11/28/2018   CHOL 161 05/06/2018   TRIG 134.0 05/06/2018   HDL 54.20 05/06/2018   LDLDIRECT 131.7 12/27/2009   LDLCALC 80 05/06/2018   ALT 14 11/22/2018   AST 18 11/22/2018   NA 132 (L) 11/28/2018   K 3.7 11/28/2018   CL 101  11/28/2018   CREATININE 0.56 (L) 11/28/2018   BUN 11 11/28/2018   CO2 24 11/28/2018   TSH 1.24 05/06/2018   PSA 1.49 05/06/2018   INR 1.1 11/22/2018   HGBA1C 5.4 05/06/2018   MICROALBUR 1.1 02/25/2013      PT/INR: No results for input(s): LABPROT, INR in the last 72 hours. Radiology: No results found.   Assessment/Plan: S/P Procedure(s) (LRB): VIDEO BRONCHOSCOPY (N/A) TRANSHIATAL TOTAL ESOPHAGECTOMY COMPLETE WITH CERVICAL ESOPHAGOGASTROSTOMY (N/A) FEEDING JEJUNOSTOMY (N/A) Pyloroplasty Mobilize Developed urinary retention , io cath this am 10-15 % right PTX after chest tube out yesterday - monitor  Leave chest tube in place now  Increasing tube  feeding    [name] [name] 11/28/2018 8:24 AM

## 2018-11-28 NOTE — Progress Notes (Signed)
Pt accidentally pulled out NG tube. This RN notified Dr. Servando Snare who told me to complete an incident report. Incident report was completed as instructed. This RN also made charge nurse and director aware of the incident and the completion of the safety zone. Will continue to monitor.

## 2018-11-28 NOTE — Progress Notes (Signed)
CT surgery p.m. Rounds  Patient feels well without nausea, burping, abdominal discomfort Sinus rhythm Tolerating tube feeds Minimal chest tube drainage In and out Foley catheter for bladder residual

## 2018-11-29 ENCOUNTER — Inpatient Hospital Stay (HOSPITAL_COMMUNITY): Payer: Medicare Other

## 2018-11-29 LAB — BASIC METABOLIC PANEL
Anion gap: 7 (ref 5–15)
BUN: 13 mg/dL (ref 8–23)
CO2: 25 mmol/L (ref 22–32)
Calcium: 8.2 mg/dL — ABNORMAL LOW (ref 8.9–10.3)
Chloride: 102 mmol/L (ref 98–111)
Creatinine, Ser: 0.62 mg/dL (ref 0.61–1.24)
GFR calc Af Amer: >60 mL/min (ref >60)
GFR calc non Af Amer: >60 mL/min (ref >60)
Glucose, Bld: 126 mg/dL — ABNORMAL HIGH (ref 70–99)
Potassium: 3.9 mmol/L (ref 3.5–5.1)
Sodium: 134 mmol/L — ABNORMAL LOW (ref 135–145)

## 2018-11-29 LAB — GLUCOSE, CAPILLARY
Glucose-Capillary: 111 mg/dL — ABNORMAL HIGH (ref 70–99)
Glucose-Capillary: 128 mg/dL — ABNORMAL HIGH (ref 70–99)
Glucose-Capillary: 128 mg/dL — ABNORMAL HIGH (ref 70–99)
Glucose-Capillary: 84 mg/dL (ref 70–99)
Glucose-Capillary: 86 mg/dL (ref 70–99)
Glucose-Capillary: 99 mg/dL (ref 70–99)

## 2018-11-29 LAB — CBC
HCT: 30.2 % — ABNORMAL LOW (ref 39.0–52.0)
Hemoglobin: 10 g/dL — ABNORMAL LOW (ref 13.0–17.0)
MCH: 30.4 pg (ref 26.0–34.0)
MCHC: 33.1 g/dL (ref 30.0–36.0)
MCV: 91.8 fL (ref 80.0–100.0)
Platelets: 194 10*3/uL (ref 150–400)
RBC: 3.29 MIL/uL — ABNORMAL LOW (ref 4.22–5.81)
RDW: 13.3 % (ref 11.5–15.5)
WBC: 9.8 10*3/uL (ref 4.0–10.5)
nRBC: 0 % (ref 0.0–0.2)

## 2018-11-29 MED ORDER — DEXTROSE-NACL 5-0.45 % IV SOLN
INTRAVENOUS | Status: DC
  Administered 2018-11-29 – 2018-12-02 (×2): via INTRAVENOUS

## 2018-11-29 MED ORDER — OXYCODONE HCL 5 MG/5ML PO SOLN
5.0000 mg | ORAL | Status: DC | PRN
  Administered 2018-11-30 – 2018-12-18 (×45): 5 mg
  Filled 2018-11-29 (×45): qty 5

## 2018-11-29 MED ORDER — BETHANECHOL 1 MG/ML PEDIATRIC ORAL SUSPENSION
10.0000 mg | Freq: Four times a day (QID) | ORAL | Status: DC
  Administered 2018-11-29 – 2018-12-03 (×18): 10 mg via ORAL
  Filled 2018-11-29 (×22): qty 10

## 2018-11-29 MED ORDER — OSMOLITE 1.5 CAL PO LIQD
1000.0000 mL | ORAL | Status: DC
  Administered 2018-11-29: 1000 mL
  Filled 2018-11-29 (×3): qty 1000

## 2018-11-29 MED ORDER — ACETAMINOPHEN 160 MG/5ML PO SOLN
325.0000 mg | ORAL | Status: DC | PRN
  Administered 2018-12-03 – 2018-12-18 (×4): 325 mg via ORAL
  Filled 2018-11-29 (×4): qty 20.3

## 2018-11-29 NOTE — Progress Notes (Signed)
Pt still unable to void, I&O cath performed per order. 375 cc  Clear orange/amber urine obtained. Pt tolerated NAD.

## 2018-11-29 NOTE — Progress Notes (Signed)
Patient ID: [name] [name], male   DOB: 1951-04-07, 68 y.o.   MRN: [id number] EVENING ROUNDS NOTE :     [address]       [place] [address]             [phone]                 4 Days Post-Op Procedure(s) (LRB): VIDEO BRONCHOSCOPY (N/A) TRANSHIATAL TOTAL ESOPHAGECTOMY COMPLETE WITH CERVICAL ESOPHAGOGASTROSTOMY (N/A) FEEDING JEJUNOSTOMY (N/A) Pyloroplasty  Total Length of Stay:  LOS: 4 days  BP 110/65   Pulse 63   Temp 98.2 F (36.8 C) (Oral)   Resp 16   Ht 5\' 11"  (1.803 m)   Wt 73.7 kg   SpO2 91%   BMI 22.66 kg/m   .Intake/Output      06/11 0701 - 06/12 0700 06/12 0701 - 06/13 0700   I.V. (mL/kg) 1257.9 (17.1) 238.6 (3.2)   Other 170    NG/GT 692 105.8   IV Piggyback 112.3 0   Total Intake(mL/kg) 2232.3 (30.3) 344.4 (4.7)   Urine (mL/kg/hr) 725 (0.4)    Emesis/NG output 150    Drains  0   Chest Tube 25 0   Total Output 900 0   Net +1332.3 +344.4          . dexmedetomidine (PRECEDEX) IV infusion Stopped (11/25/18 1939)  . dextrose 5 % and 0.45% NaCl    . feeding supplement (OSMOLITE 1.5 CAL) 1,000 mL (11/29/18 1200)  . potassium chloride 10 mEq (11/28/18 1002)     Lab Results  Component Value Date   WBC 9.8 11/29/2018   HGB 10.0 (L) 11/29/2018   HCT 30.2 (L) 11/29/2018   PLT 194 11/29/2018   GLUCOSE 126 (H) 11/29/2018   CHOL 161 05/06/2018   TRIG 134.0 05/06/2018   HDL 54.20 05/06/2018   LDLDIRECT 131.7 12/27/2009   LDLCALC 80 05/06/2018   ALT 14 11/22/2018   AST 18 11/22/2018   NA 134 (L) 11/29/2018   K 3.9 11/29/2018   CL 102 11/29/2018   CREATININE 0.62 11/29/2018   BUN 13 11/29/2018   CO2 25 11/29/2018   TSH 1.24 05/06/2018   PSA 1.49 05/06/2018   INR 1.1 11/22/2018   HGBA1C 5.4 05/06/2018   MICROALBUR 1.1 02/25/2013   No drainage from neck incision Will try to void again, maybe need foley replaced , started on urecholine    [place] [phone] Office [phone] 11/29/2018 4:22 PM

## 2018-11-29 NOTE — Progress Notes (Addendum)
TCTS DAILY ICU PROGRESS NOTE                   [address]            [place] [address]          [phone]   4 Days Post-Op Procedure(s) (LRB): VIDEO BRONCHOSCOPY (N/A) TRANSHIATAL TOTAL ESOPHAGECTOMY COMPLETE WITH CERVICAL ESOPHAGOGASTROSTOMY (N/A) FEEDING JEJUNOSTOMY (N/A) Pyloroplasty  Total Length of Stay:  LOS: 4 days   Subjective: Feels okay this morning. He is a little discouraged regarding his urinary retention.  Objective: Vital signs in last 24 hours: Temp:  [98.2 F (36.8 C)-100 F (37.8 C)] 98.6 F (37 C) (06/12 0750) Pulse Rate:  [59-101] 61 (06/12 0600) Cardiac Rhythm: Normal sinus rhythm (06/12 0020) Resp:  [11-26] 16 (06/12 0600) BP: (104-150)/(59-90) 118/70 (06/12 0600) SpO2:  [88 %-99 %] 98 % (06/12 0600)  Filed Weights   11/26/18 0540 11/27/18 0600  Weight: 73.2 kg 73.7 kg    Weight change:    Hemodynamic parameters for last 24 hours:    Intake/Output from previous day: 06/11 0701 - 06/12 0700 In: 2232.3 [I.V.:1257.9; NG/GT:692; IV Piggyback:112.3] Out: 900 [Urine:725; Emesis/NG output:150; Chest Tube:25]  Intake/Output this shift: No intake/output data recorded.  Current Meds: Scheduled Meds:  Chlorhexidine Gluconate Cloth  6 each Topical Daily   enoxaparin (LOVENOX) injection  40 mg Subcutaneous Q24H   feeding supplement (PRO-STAT SUGAR FREE 64)  30 mL Per Tube BID   insulin aspart  0-24 Units Subcutaneous Q4H   mouth rinse  15 mL Mouth Rinse BID   morphine   Intravenous Q4H   pantoprazole (PROTONIX) IV  40 mg Intravenous Q12H   sodium chloride flush  10-40 mL Intracatheter Q12H   Continuous Infusions:  dexmedetomidine (PRECEDEX) IV infusion Stopped (11/25/18 1939)   dextrose 5 % and 0.45% NaCl 40 mL/hr at 11/29/18 0824   feeding supplement (OSMOLITE 1.5 CAL) 40 mL/hr at 11/29/18 0825   potassium chloride 10 mEq (11/28/18 1002)   PRN Meds:.diphenhydrAMINE **OR** diphenhydrAMINE, morphine injection,  naloxone **AND** sodium chloride flush, ondansetron (ZOFRAN) IV, ondansetron (ZOFRAN) IV, potassium chloride, sodium chloride flush  General appearance: alert, cooperative and no distress Heart: regular rate and rhythm, S1, S2 normal, no murmur, click, rub or gallop Lungs: clear to auscultation bilaterally Abdomen: soft, non-tender; bowel sounds normal; no masses,  no organomegaly Extremities: extremities normal, atraumatic, no cyanosis or edema Wound: clean and dry neck and abdominal incision.   Lab Results: CBC: Recent Labs    11/28/18 0449 11/29/18 0451  WBC 10.4 9.8  HGB 11.1* 10.0*  HCT 33.2* 30.2*  PLT 190 194   BMET:  Recent Labs    11/28/18 0449 11/29/18 0451  NA 132* 134*  K 3.7 3.9  CL 101 102  CO2 24 25  GLUCOSE 126* 126*  BUN 11 13  CREATININE 0.56* 0.62  CALCIUM 8.3* 8.2*    CMET: Lab Results  Component Value Date   WBC 9.8 11/29/2018   HGB 10.0 (L) 11/29/2018   HCT 30.2 (L) 11/29/2018   PLT 194 11/29/2018   GLUCOSE 126 (H) 11/29/2018   CHOL 161 05/06/2018   TRIG 134.0 05/06/2018   HDL 54.20 05/06/2018   LDLDIRECT 131.7 12/27/2009   LDLCALC 80 05/06/2018   ALT 14 11/22/2018   AST 18 11/22/2018   NA 134 (L) 11/29/2018   K 3.9 11/29/2018   CL 102 11/29/2018   CREATININE 0.62 11/29/2018   BUN 13 11/29/2018  CO2 25 11/29/2018   TSH 1.24 05/06/2018   PSA 1.49 05/06/2018   INR 1.1 11/22/2018   HGBA1C 5.4 05/06/2018   MICROALBUR 1.1 02/25/2013      PT/INR: No results for input(s): LABPROT, INR in the last 72 hours. Radiology: Dg Chest Port 1 View  Result Date: 11/29/2018 CLINICAL DATA:  Recent cardiac surgery EXAM: PORTABLE CHEST 1 VIEW COMPARISON:  11/28/2018 FINDINGS: Cardiac shadow is enlarged but stable. Right jugular central line is seen. A stable right-sided pneumothorax is noted. Bibasilar atelectatic changes and effusions are seen. Small left pneumothorax is noted. It is also stable in appearance with chest tube in place. IMPRESSION:  Bilateral pleural effusions and pneumothoraces relatively stable from the prior study. Electronically Signed   By: Inez Catalina M.D.   On: 11/29/2018 07:45     Assessment/Plan: S/P Procedure(s) (LRB): VIDEO BRONCHOSCOPY (N/A) TRANSHIATAL TOTAL ESOPHAGECTOMY COMPLETE WITH CERVICAL ESOPHAGOGASTROSTOMY (N/A) FEEDING JEJUNOSTOMY (N/A) Pyloroplasty  1. CV-NSR in the 70s, BP well controlled.  2. Pulm-Tolerating room air with good oxygen saturation. Working on Chiropodist with significant improvement. Bilateral pleural effusions and pneumothoraces relatively stable from the prior study.May be able to get left chest tube out since it seems to have pulled back and is now just barely in the chest wall. 3. Renal-creatinine 0.62, now back to baseline 4. Urinary retention. In and out cath yesterday.  4. Endo-blood glucose well controlled.  5. H and H stable 6. Lovenox for DVT prophylaxis   Plan: Increase TF to rate of 40. Place urinary catheter back in for now. Spoke to pharmacy and there does not seem to be any IV equivalent for Flomax at this time available. Continue ambulation and incentive spirometer use. Incisions healing well.     [name] [name] 11/29/2018 8:32 AM  Main complaint this am , has been unable to void but feels like he can Wbc normal No drainage from left neck drain Cr stable  Will try urecholine suspension, for urinary retention Ng pulled out yesterday, xray today does not show gastric dilation I have seen and examined [name] [name] and agree with the above assessment  and plan.  [name] MD Beeper [phone] Office [phone] 11/29/2018 11:30 AM

## 2018-11-29 NOTE — Progress Notes (Signed)
16Fr Foley placed with Yasmin Alejandro,RN and Winfred Burn with sterile protocol.  Foley huddle done prior to placement with Lorrin Goodell.  Dr. Servando Snare previously made aware and agreeable to foley placement.  Pt. tolerated well.

## 2018-11-30 ENCOUNTER — Inpatient Hospital Stay (HOSPITAL_COMMUNITY): Payer: Medicare Other

## 2018-11-30 LAB — CBC
HCT: 29.5 % — ABNORMAL LOW (ref 39.0–52.0)
Hemoglobin: 9.7 g/dL — ABNORMAL LOW (ref 13.0–17.0)
MCH: 30.4 pg (ref 26.0–34.0)
MCHC: 32.9 g/dL (ref 30.0–36.0)
MCV: 92.5 fL (ref 80.0–100.0)
Platelets: 202 10*3/uL (ref 150–400)
RBC: 3.19 MIL/uL — ABNORMAL LOW (ref 4.22–5.81)
RDW: 13.2 % (ref 11.5–15.5)
WBC: 10.4 10*3/uL (ref 4.0–10.5)
nRBC: 0 % (ref 0.0–0.2)

## 2018-11-30 LAB — BASIC METABOLIC PANEL
Anion gap: 6 (ref 5–15)
BUN: 13 mg/dL (ref 8–23)
CO2: 24 mmol/L (ref 22–32)
Calcium: 7.8 mg/dL — ABNORMAL LOW (ref 8.9–10.3)
Chloride: 103 mmol/L (ref 98–111)
Creatinine, Ser: 0.68 mg/dL (ref 0.61–1.24)
GFR calc Af Amer: >60 mL/min (ref >60)
GFR calc non Af Amer: >60 mL/min (ref >60)
Glucose, Bld: 132 mg/dL — ABNORMAL HIGH (ref 70–99)
Potassium: 3.5 mmol/L (ref 3.5–5.1)
Sodium: 133 mmol/L — ABNORMAL LOW (ref 135–145)

## 2018-11-30 LAB — GLUCOSE, CAPILLARY
Glucose-Capillary: 103 mg/dL — ABNORMAL HIGH (ref 70–99)
Glucose-Capillary: 109 mg/dL — ABNORMAL HIGH (ref 70–99)
Glucose-Capillary: 111 mg/dL — ABNORMAL HIGH (ref 70–99)
Glucose-Capillary: 132 mg/dL — ABNORMAL HIGH (ref 70–99)
Glucose-Capillary: 95 mg/dL (ref 70–99)
Glucose-Capillary: 97 mg/dL (ref 70–99)

## 2018-11-30 MED ORDER — OSMOLITE 1.5 CAL PO LIQD
1000.0000 mL | ORAL | Status: DC
  Administered 2018-11-30 – 2018-12-01 (×3): 1000 mL
  Filled 2018-11-30 (×5): qty 1000

## 2018-11-30 NOTE — Progress Notes (Signed)
EVENING ROUNDS NOTE :     [address]       [place] [address]             [phone]                 5 Days Post-Op Procedure(s) (LRB): VIDEO BRONCHOSCOPY (N/A) TRANSHIATAL TOTAL ESOPHAGECTOMY COMPLETE WITH CERVICAL ESOPHAGOGASTROSTOMY (N/A) FEEDING JEJUNOSTOMY (N/A) Pyloroplasty  Total Length of Stay:  LOS: 5 days  BP 125/70 (BP Location: Right Arm)   Pulse 79   Temp 98.6 F (37 C) (Oral)   Resp (!) 21   Ht 5\' 11"  (1.803 m)   Wt 75.7 kg   SpO2 96%   BMI 23.28 kg/m   .Intake/Output      06/12 0701 - 06/13 0700 06/13 0701 - 06/14 0700   I.V. (mL/kg) 672.7 (8.9) 179.5 (2.4)   Other  240   NG/GT 505.8 490   IV Piggyback 6 21.1   Total Intake(mL/kg) 1184.5 (15.6) 930.6 (12.3)   Urine (mL/kg/hr) 850 (0.5)    Emesis/NG output     Drains 0    Chest Tube 0    Total Output 850    Net +334.5 +930.6          . dexmedetomidine (PRECEDEX) IV infusion Stopped (11/25/18 1939)  . dextrose 5 % and 0.45% NaCl 10 mL/hr at 11/30/18 1700  . feeding supplement (OSMOLITE 1.5 CAL) 1,000 mL (11/30/18 1054)  . potassium chloride 5 mL/hr at 11/30/18 1100     Lab Results  Component Value Date   WBC 10.4 11/30/2018   HGB 9.7 (L) 11/30/2018   HCT 29.5 (L) 11/30/2018   PLT 202 11/30/2018   GLUCOSE 132 (H) 11/30/2018   CHOL 161 05/06/2018   TRIG 134.0 05/06/2018   HDL 54.20 05/06/2018   LDLDIRECT 131.7 12/27/2009   LDLCALC 80 05/06/2018   ALT 14 11/22/2018   AST 18 11/22/2018   NA 133 (L) 11/30/2018   K 3.5 11/30/2018   CL 103 11/30/2018   CREATININE 0.68 11/30/2018   BUN 13 11/30/2018   CO2 24 11/30/2018   TSH 1.24 05/06/2018   PSA 1.49 05/06/2018   INR 1.1 11/22/2018   HGBA1C 5.4 05/06/2018   MICROALBUR 1.1 02/25/2013    Stable day Ambulating  Well   [name] MD  Beeper [phone] Office [phone] 11/30/2018 6:35 PM

## 2018-11-30 NOTE — Progress Notes (Signed)
Patient ID: [name] [name], male   DOB: [birth year], 68 y.o.   MRN: [id number] TCTS DAILY ICU PROGRESS NOTE                   [address]            [place] [address]          [phone]   5 Days Post-Op Procedure(s) (LRB): VIDEO BRONCHOSCOPY (N/A) TRANSHIATAL TOTAL ESOPHAGECTOMY COMPLETE WITH CERVICAL ESOPHAGOGASTROSTOMY (N/A) FEEDING JEJUNOSTOMY (N/A) Pyloroplasty  Total Length of Stay:  LOS: 5 days   Subjective: Patient awake alert neurologically intact, noted increasing abdominal cramping and gas.  Improved after walking.  No bowel movement yet remains n.p.o. no drainage from the neck [place] Foley is back in because of urinary retention   objective: Vital signs in last 24 hours: Temp:  [98.2 F (36.8 C)-99.3 F (37.4 C)] 98.4 F (36.9 C) (06/13 0734) Pulse Rate:  [61-99] 83 (06/13 0500) Cardiac Rhythm: Normal sinus rhythm (06/13 0500) Resp:  [16-21] 21 (06/12 1600) BP: (110-126)/(65-78) 125/75 (06/13 0500) SpO2:  [91 %-98 %] 97 % (06/13 0500) Weight:  [75.7 kg] 75.7 kg (06/13 0500)  Filed Weights   11/26/18 0540 11/27/18 0600 11/30/18 0500  Weight: 73.2 kg 73.7 kg 75.7 kg    Weight change:    Hemodynamic parameters for last 24 hours:    Intake/Output from previous day: 06/12 0701 - 06/13 0700 In: 1102 [I.V.:630.2; NG/GT:465.8; IV Piggyback:6] Out: 850 [Urine:850]  Intake/Output this shift: No intake/output data recorded.  Current Meds: Scheduled Meds: . bethanechol  10 mg Oral QID  . Chlorhexidine Gluconate Cloth  6 each Topical Daily  . enoxaparin (LOVENOX) injection  40 mg Subcutaneous Q24H  . feeding supplement (PRO-STAT SUGAR FREE 64)  30 mL Per Tube BID  . insulin aspart  0-24 Units Subcutaneous Q4H  . mouth rinse  15 mL Mouth Rinse BID  . morphine   Intravenous Q4H  . pantoprazole (PROTONIX) IV  40 mg Intravenous Q12H  . sodium chloride flush  10-40 mL Intracatheter Q12H   Continuous Infusions: . dexmedetomidine  (PRECEDEX) IV infusion Stopped (11/25/18 1939)  . dextrose 5 % and 0.45% NaCl 40 mL/hr at 11/30/18 0547  . feeding supplement (OSMOLITE 1.5 CAL) 1,000 mL (11/29/18 1200)  . potassium chloride 10 mEq (11/30/18 0547)   PRN Meds:.oxyCODONE **AND** acetaminophen, diphenhydrAMINE **OR** diphenhydrAMINE, morphine injection, naloxone **AND** sodium chloride flush, ondansetron (ZOFRAN) IV, ondansetron (ZOFRAN) IV, potassium chloride, sodium chloride flush  General appearance: alert and cooperative Neurologic: intact Heart: regular rate and rhythm, S1, S2 normal, no murmur, click, rub or gallop Lungs: clear to auscultation bilaterally Abdomen: soft, non-tender; bowel sounds normal; no masses,  no organomegaly Extremities: extremities normal, atraumatic, no cyanosis or edema and Homans sign is negative, no sign of DVT Wound: Wounds are healing well no drainage or evidence of infection  Lab Results: CBC: Recent Labs    11/29/18 0451 11/30/18 0152  WBC 9.8 10.4  HGB 10.0* 9.7*  HCT 30.2* 29.5*  PLT 194 202   BMET:  Recent Labs    11/29/18 0451 11/30/18 0152  NA 134* 133*  K 3.9 3.5  CL 102 103  CO2 25 24  GLUCOSE 126* 132*  BUN 13 13  CREATININE 0.62 0.68  CALCIUM 8.2* 7.8*    CMET: Lab Results  Component Value Date   WBC 10.4 11/30/2018   HGB 9.7 (L) 11/30/2018   HCT 29.5 (L) 11/30/2018   PLT 202  11/30/2018   GLUCOSE 132 (H) 11/30/2018   CHOL 161 05/06/2018   TRIG 134.0 05/06/2018   HDL 54.20 05/06/2018   LDLDIRECT 131.7 12/27/2009   LDLCALC 80 05/06/2018   ALT 14 11/22/2018   AST 18 11/22/2018   NA 133 (L) 11/30/2018   K 3.5 11/30/2018   CL 103 11/30/2018   CREATININE 0.68 11/30/2018   BUN 13 11/30/2018   CO2 24 11/30/2018   TSH 1.24 05/06/2018   PSA 1.49 05/06/2018   INR 1.1 11/22/2018   HGBA1C 5.4 05/06/2018   MICROALBUR 1.1 02/25/2013      PT/INR: No results for input(s): LABPROT, INR in the last 72 hours. Radiology: No results found.    Assessment/Plan: S/P Procedure(s) (LRB): VIDEO BRONCHOSCOPY (N/A) TRANSHIATAL TOTAL ESOPHAGECTOMY COMPLETE WITH CERVICAL ESOPHAGOGASTROSTOMY (N/A) FEEDING JEJUNOSTOMY (N/A) Pyloroplasty Mobilize Convert to liquid pain medicine via J-tube and DC PCA pump Leave Foley in today, started on Urecholine and will try to remove tomorrow Plan Gastrografin swallow on Monday    [name] 11/30/2018 7:46 AM

## 2018-12-01 LAB — CBC
HCT: 30.9 % — ABNORMAL LOW (ref 39.0–52.0)
Hemoglobin: 10.1 g/dL — ABNORMAL LOW (ref 13.0–17.0)
MCH: 30.1 pg (ref 26.0–34.0)
MCHC: 32.7 g/dL (ref 30.0–36.0)
MCV: 92.2 fL (ref 80.0–100.0)
Platelets: 231 10*3/uL (ref 150–400)
RBC: 3.35 MIL/uL — ABNORMAL LOW (ref 4.22–5.81)
RDW: 13.2 % (ref 11.5–15.5)
WBC: 9.9 10*3/uL (ref 4.0–10.5)
nRBC: 0 % (ref 0.0–0.2)

## 2018-12-01 LAB — BASIC METABOLIC PANEL
Anion gap: 8 (ref 5–15)
BUN: 15 mg/dL (ref 8–23)
CO2: 24 mmol/L (ref 22–32)
Calcium: 8.1 mg/dL — ABNORMAL LOW (ref 8.9–10.3)
Chloride: 103 mmol/L (ref 98–111)
Creatinine, Ser: 0.55 mg/dL — ABNORMAL LOW (ref 0.61–1.24)
GFR calc Af Amer: >60 mL/min (ref >60)
GFR calc non Af Amer: >60 mL/min (ref >60)
Glucose, Bld: 121 mg/dL — ABNORMAL HIGH (ref 70–99)
Potassium: 3.8 mmol/L (ref 3.5–5.1)
Sodium: 135 mmol/L (ref 135–145)

## 2018-12-01 LAB — GLUCOSE, CAPILLARY
Glucose-Capillary: 88 mg/dL (ref 70–99)
Glucose-Capillary: 130 mg/dL — ABNORMAL HIGH (ref 70–99)
Glucose-Capillary: 72 mg/dL (ref 70–99)
Glucose-Capillary: 106 mg/dL — ABNORMAL HIGH (ref 70–99)
Glucose-Capillary: 130 mg/dL — ABNORMAL HIGH (ref 70–99)

## 2018-12-01 NOTE — Progress Notes (Signed)
Spoke to RN about central line d/c order. RN to follow-up.

## 2018-12-01 NOTE — Progress Notes (Signed)
Patient ID: Ferlando Lia, male   DOB: [birth year], 68 y.o.   MRN: [id number] EVENING ROUNDS NOTE :     [address]       [place] [address]             [phone]                 6 Days Post-Op Procedure(s) (LRB): VIDEO BRONCHOSCOPY (N/A) TRANSHIATAL TOTAL ESOPHAGECTOMY COMPLETE WITH CERVICAL ESOPHAGOGASTROSTOMY (N/A) FEEDING JEJUNOSTOMY (N/A) Pyloroplasty  Total Length of Stay:  LOS: 6 days  BP 125/71   Pulse 86   Temp 99.7 F (37.6 C) (Oral)   Resp (!) 21   Ht 5\' 11"  (1.803 m)   Wt 76.4 kg   SpO2 96%   BMI 23.49 kg/m   .Intake/Output      06/13 0701 - 06/14 0700 06/14 0701 - 06/15 0700   P.O. 0    I.V. (mL/kg) 296 (3.9)    Other 340    NG/GT 490    IV Piggyback 32.7    Total Intake(mL/kg) 1158.6 (15.2)    Urine (mL/kg/hr) 795 (0.4)    Drains     Chest Tube     Total Output 795    Net +363.6           . dexmedetomidine (PRECEDEX) IV infusion Stopped (11/25/18 1939)  . dextrose 5 % and 0.45% NaCl 10 mL/hr at 12/01/18 0700  . feeding supplement (OSMOLITE 1.5 CAL) 1,000 mL (12/01/18 1205)  . potassium chloride Stopped (11/30/18 1319)     Lab Results  Component Value Date   WBC 9.9 12/01/2018   HGB 10.1 (L) 12/01/2018   HCT 30.9 (L) 12/01/2018   PLT 231 12/01/2018   GLUCOSE 121 (H) 12/01/2018   CHOL 161 05/06/2018   TRIG 134.0 05/06/2018   HDL 54.20 05/06/2018   LDLDIRECT 131.7 12/27/2009   LDLCALC 80 05/06/2018   ALT 14 11/22/2018   AST 18 11/22/2018   NA 135 12/01/2018   K 3.8 12/01/2018   CL 103 12/01/2018   CREATININE 0.55 (L) 12/01/2018   BUN 15 12/01/2018   CO2 24 12/01/2018   TSH 1.24 05/06/2018   PSA 1.49 05/06/2018   INR 1.1 11/22/2018   HGBA1C 5.4 05/06/2018   MICROALBUR 1.1 02/25/2013   Stable for swallow in am   [name] MD  Beeper [phone] Office [phone] 12/01/2018 6:30 PM

## 2018-12-01 NOTE — Progress Notes (Signed)
Patient ID: [name] [name], male   DOB: 1950-10-19, 68 y.o.   MRN: [id number] TCTS DAILY ICU PROGRESS NOTE                   [address]            [place] [address]          [phone]   6 Days Post-Op Procedure(s) (LRB): VIDEO BRONCHOSCOPY (N/A) TRANSHIATAL TOTAL ESOPHAGECTOMY COMPLETE WITH CERVICAL ESOPHAGOGASTROSTOMY (N/A) FEEDING JEJUNOSTOMY (N/A) Pyloroplasty  Total Length of Stay:  LOS: 6 days   Subjective: Up to chair this a.m., ambulating well   Objective: Vital signs in last 24 hours: Temp:  [98.6 F (37 C)-99.7 F (37.6 C)] 99.4 F (37.4 C) (06/14 0747) Pulse Rate:  [64-87] 65 (06/14 0600) Cardiac Rhythm: Normal sinus rhythm (06/14 0400) Resp:  [20-21] 21 (06/13 1314) BP: (113-130)/(63-83) 113/69 (06/14 0600) SpO2:  [94 %-99 %] 96 % (06/14 0600) Weight:  [76.4 kg] 76.4 kg (06/14 0500)  Filed Weights   11/27/18 0600 11/30/18 0500 12/01/18 0500  Weight: 73.7 kg 75.7 kg 76.4 kg    Weight change: 0.7 kg   Hemodynamic parameters for last 24 hours:    Intake/Output from previous day: 06/13 0701 - 06/14 0700 In: 1158.6 [I.V.:296; NG/GT:490; IV Piggyback:32.7] Out: 795 [Urine:795]  Intake/Output this shift: No intake/output data recorded.  Current Meds: Scheduled Meds: . bethanechol  10 mg Oral QID  . Chlorhexidine Gluconate Cloth  6 each Topical Daily  . enoxaparin (LOVENOX) injection  40 mg Subcutaneous Q24H  . feeding supplement (PRO-STAT SUGAR FREE 64)  30 mL Per Tube BID  . insulin aspart  0-24 Units Subcutaneous Q4H  . mouth rinse  15 mL Mouth Rinse BID  . pantoprazole (PROTONIX) IV  40 mg Intravenous Q12H  . sodium chloride flush  10-40 mL Intracatheter Q12H   Continuous Infusions: . dexmedetomidine (PRECEDEX) IV infusion Stopped (11/25/18 1939)  . dextrose 5 % and 0.45% NaCl 10 mL/hr at 12/01/18 0700  . feeding supplement (OSMOLITE 1.5 CAL) 1,000 mL (11/30/18 1830)  . potassium chloride Stopped (11/30/18 1319)    PRN Meds:.oxyCODONE **AND** acetaminophen, diphenhydrAMINE **OR** diphenhydrAMINE, morphine injection, naloxone **AND** sodium chloride flush, ondansetron (ZOFRAN) IV, ondansetron (ZOFRAN) IV, potassium chloride, sodium chloride flush  General appearance: alert, cooperative and no distress Neurologic: intact Heart: regular rate and rhythm, S1, S2 normal, no murmur, click, rub or gallop Lungs: diminished breath sounds bibasilar Abdomen: soft, non-tender; bowel sounds normal; no masses,  no organomegaly Extremities: extremities normal, atraumatic, no cyanosis or edema and Homans sign is negative, no sign of DVT Wound: Incisions healing well minimal drainage from left neck  Lab Results: CBC: Recent Labs    11/30/18 0152 12/01/18 0420  WBC 10.4 9.9  HGB 9.7* 10.1*  HCT 29.5* 30.9*  PLT 202 231   BMET:  Recent Labs    11/30/18 0152 12/01/18 0420  NA 133* 135  K 3.5 3.8  CL 103 103  CO2 24 24  GLUCOSE 132* 121*  BUN 13 15  CREATININE 0.68 0.55*  CALCIUM 7.8* 8.1*    CMET: Lab Results  Component Value Date   WBC 9.9 12/01/2018   HGB 10.1 (L) 12/01/2018   HCT 30.9 (L) 12/01/2018   PLT 231 12/01/2018   GLUCOSE 121 (H) 12/01/2018   CHOL 161 05/06/2018   TRIG 134.0 05/06/2018   HDL 54.20 05/06/2018   LDLDIRECT 131.7 12/27/2009   LDLCALC 80 05/06/2018   ALT 14  11/22/2018   AST 18 11/22/2018   NA 135 12/01/2018   K 3.8 12/01/2018   CL 103 12/01/2018   CREATININE 0.55 (L) 12/01/2018   BUN 15 12/01/2018   CO2 24 12/01/2018   TSH 1.24 05/06/2018   PSA 1.49 05/06/2018   INR 1.1 11/22/2018   HGBA1C 5.4 05/06/2018   MICROALBUR 1.1 02/25/2013      PT/INR: No results for input(s): LABPROT, INR in the last 72 hours. Radiology: No results found.   Assessment/Plan: S/P Procedure(s) (LRB): VIDEO BRONCHOSCOPY (N/A) TRANSHIATAL TOTAL ESOPHAGECTOMY COMPLETE WITH CERVICAL ESOPHAGOGASTROSTOMY (N/A) FEEDING JEJUNOSTOMY (N/A) Pyloroplasty Mobilize DC central line Attempt  at removal of Foley now on Urecholine Gastrografin esophageal swallow in a.m. Follow-up chest x-ray in a.m.   [name] [name] 12/01/2018 9:04 AM

## 2018-12-02 ENCOUNTER — Encounter (HOSPITAL_COMMUNITY): Payer: Self-pay | Admitting: Registered Nurse

## 2018-12-02 ENCOUNTER — Inpatient Hospital Stay (HOSPITAL_COMMUNITY): Payer: Medicare Other

## 2018-12-02 ENCOUNTER — Encounter (HOSPITAL_COMMUNITY)
Admission: RE | Disposition: A | Discharge status: Home / Self Care | Payer: Self-pay | Source: Home / Self Care | Attending: Cardiothoracic Surgery

## 2018-12-02 ENCOUNTER — Inpatient Hospital Stay (HOSPITAL_COMMUNITY): Payer: Medicare Other | Admitting: Critical Care Medicine

## 2018-12-02 DIAGNOSIS — I9789 Other postprocedural complications and disorders of the circulatory system, not elsewhere classified: Secondary | ICD-10-CM

## 2018-12-02 HISTORY — PX: PARASTERNAL EXPLORATION: SHX6388

## 2018-12-02 LAB — CBC
HCT: 33.5 % — ABNORMAL LOW (ref 39.0–52.0)
Hemoglobin: 11 g/dL — ABNORMAL LOW (ref 13.0–17.0)
MCH: 30.6 pg (ref 26.0–34.0)
MCHC: 32.8 g/dL (ref 30.0–36.0)
MCV: 93.1 fL (ref 80.0–100.0)
Platelets: 259 10*3/uL (ref 150–400)
RBC: 3.6 MIL/uL — ABNORMAL LOW (ref 4.22–5.81)
RDW: 13.2 % (ref 11.5–15.5)
WBC: 12.2 10*3/uL — ABNORMAL HIGH (ref 4.0–10.5)
nRBC: 0 % (ref 0.0–0.2)

## 2018-12-02 LAB — BASIC METABOLIC PANEL
Anion gap: 9 (ref 5–15)
BUN: 16 mg/dL (ref 8–23)
CO2: 23 mmol/L (ref 22–32)
Calcium: 8.3 mg/dL — ABNORMAL LOW (ref 8.9–10.3)
Chloride: 104 mmol/L (ref 98–111)
Creatinine, Ser: 0.76 mg/dL (ref 0.61–1.24)
GFR calc Af Amer: >60 mL/min (ref >60)
GFR calc non Af Amer: >60 mL/min (ref >60)
Glucose, Bld: 100 mg/dL — ABNORMAL HIGH (ref 70–99)
Potassium: 5 mmol/L (ref 3.5–5.1)
Sodium: 136 mmol/L (ref 135–145)

## 2018-12-02 LAB — GLUCOSE, CAPILLARY
Glucose-Capillary: 100 mg/dL — ABNORMAL HIGH (ref 70–99)
Glucose-Capillary: 120 mg/dL — ABNORMAL HIGH (ref 70–99)
Glucose-Capillary: 77 mg/dL (ref 70–99)
Glucose-Capillary: 79 mg/dL (ref 70–99)
Glucose-Capillary: 84 mg/dL (ref 70–99)
Glucose-Capillary: 89 mg/dL (ref 70–99)
Glucose-Capillary: 92 mg/dL (ref 70–99)

## 2018-12-02 SURGERY — EXPLORATION, PARASTERNAL REGION
Anesthesia: General | Site: Neck | Laterality: Left

## 2018-12-02 MED ORDER — PROPOFOL 1000 MG/100ML IV EMUL
INTRAVENOUS | Status: AC
  Filled 2018-12-02: qty 200

## 2018-12-02 MED ORDER — HYDROMORPHONE HCL 1 MG/ML IJ SOLN: 0.2500 mg | INTRAMUSCULAR | Status: DC | PRN

## 2018-12-02 MED ORDER — LIDOCAINE HCL (PF) 1 % IJ SOLN
INTRAMUSCULAR | Status: AC
  Filled 2018-12-02: qty 30

## 2018-12-02 MED ORDER — MIDAZOLAM HCL 2 MG/2ML IJ SOLN
INTRAMUSCULAR | Status: AC
  Filled 2018-12-02: qty 2

## 2018-12-02 MED ORDER — PROMETHAZINE HCL 25 MG/ML IJ SOLN: 6.2500 mg | INTRAMUSCULAR | Status: DC | PRN

## 2018-12-02 MED ORDER — LACTATED RINGERS IV SOLN
INTRAVENOUS | Status: DC | PRN
  Administered 2018-12-02: 14:00:00 via INTRAVENOUS

## 2018-12-02 MED ORDER — PROPOFOL 10 MG/ML IV BOLUS
INTRAVENOUS | Status: DC | PRN
  Administered 2018-12-02: 30 mg via INTRAVENOUS

## 2018-12-02 MED ORDER — LIDOCAINE HCL 1 % IJ SOLN
INTRAMUSCULAR | Status: DC | PRN
  Administered 2018-12-02: 30 mL

## 2018-12-02 MED ORDER — SODIUM CHLORIDE 0.9 % IV SOLN
INTRAVENOUS | Status: DC | PRN
  Administered 2018-12-02: 15 ug/min via INTRAVENOUS

## 2018-12-02 MED ORDER — OXYCODONE HCL 5 MG PO TABS: 5.0000 mg | ORAL_TABLET | Freq: Once | ORAL | Status: DC | PRN

## 2018-12-02 MED ORDER — DEXTROSE 5 % IV SOLN
1.0000 g | Freq: Four times a day (QID) | INTRAVENOUS | Status: AC
  Administered 2018-12-02 – 2018-12-03 (×5): 1 g via INTRAVENOUS
  Filled 2018-12-02 (×5): qty 1

## 2018-12-02 MED ORDER — PROPOFOL 500 MG/50ML IV EMUL
INTRAVENOUS | Status: DC | PRN
  Administered 2018-12-02: 60 ug/kg/min via INTRAVENOUS

## 2018-12-02 MED ORDER — FENTANYL CITRATE (PF) 250 MCG/5ML IJ SOLN
INTRAMUSCULAR | Status: AC
  Filled 2018-12-02: qty 5

## 2018-12-02 MED ORDER — 0.9 % SODIUM CHLORIDE (POUR BTL) OPTIME
TOPICAL | Status: DC | PRN
  Administered 2018-12-02: 2000 mL

## 2018-12-02 MED ORDER — PROPOFOL 10 MG/ML IV BOLUS
INTRAVENOUS | Status: AC
  Filled 2018-12-02: qty 20

## 2018-12-02 MED ORDER — IOHEXOL 300 MG/ML  SOLN
150.0000 mL | Freq: Once | INTRAMUSCULAR | Status: AC | PRN
  Administered 2018-12-02: 100 mL via ORAL

## 2018-12-02 MED ORDER — FENTANYL CITRATE (PF) 250 MCG/5ML IJ SOLN
INTRAMUSCULAR | Status: DC | PRN
  Administered 2018-12-02: 50 ug via INTRAVENOUS
  Administered 2018-12-02 (×2): 25 ug via INTRAVENOUS

## 2018-12-02 MED ORDER — ONDANSETRON HCL 4 MG/2ML IJ SOLN
INTRAMUSCULAR | Status: DC | PRN
  Administered 2018-12-02: 4 mg via INTRAVENOUS

## 2018-12-02 MED ORDER — HEMOSTATIC AGENTS (NO CHARGE) OPTIME
TOPICAL | Status: DC | PRN
  Administered 2018-12-02: 1 via TOPICAL

## 2018-12-02 MED ORDER — OXYCODONE HCL 5 MG/5ML PO SOLN: 5.0000 mg | Freq: Once | ORAL | Status: DC | PRN

## 2018-12-02 MED ORDER — SODIUM CHLORIDE 0.9 % IV SOLN
2.0000 g | INTRAVENOUS | Status: AC
  Administered 2018-12-02: 2 g via INTRAVENOUS
  Filled 2018-12-02: qty 2

## 2018-12-02 SURGICAL SUPPLY — 41 items
BLADE SURG 10 STRL SS (BLADE) ×2 IMPLANT
BNDG CONFORM 2 STRL LF (GAUZE/BANDAGES/DRESSINGS) ×2 IMPLANT
CANISTER SUCT 3000ML PPV (MISCELLANEOUS) ×2 IMPLANT
CATH THORACIC 28FR (CATHETERS) IMPLANT
CLIP VESOCCLUDE MED 6/CT (CLIP) ×2 IMPLANT
CONT SPEC 4OZ CLIKSEAL STRL BL (MISCELLANEOUS) ×4 IMPLANT
COVER SURGICAL LIGHT HANDLE (MISCELLANEOUS) ×4 IMPLANT
COVER WAND RF STERILE (DRAPES) ×2 IMPLANT
DRAPE LAPAROTOMY T 102X78X121 (DRAPES) ×2 IMPLANT
DRSG AQUACEL AG ADV 3.5X14 (GAUZE/BANDAGES/DRESSINGS) ×2 IMPLANT
ELECT CAUTERY BLADE 6.4 (BLADE) ×2 IMPLANT
ELECT REM PT RETURN 9FT ADLT (ELECTROSURGICAL) ×2
ELECTRODE REM PT RTRN 9FT ADLT (ELECTROSURGICAL) ×1 IMPLANT
GAUZE 4X4 16PLY RFD (DISPOSABLE) ×2 IMPLANT
GAUZE SPONGE 4X4 12PLY STRL (GAUZE/BANDAGES/DRESSINGS) ×2 IMPLANT
GLOVE BIO SURGEON STRL SZ 6.5 (GLOVE) ×4 IMPLANT
GOWN STRL REUS W/ TWL LRG LVL3 (GOWN DISPOSABLE) ×1 IMPLANT
GOWN STRL REUS W/TWL LRG LVL3 (GOWN DISPOSABLE) ×1
HEMOSTAT SURGICEL 2X14 (HEMOSTASIS) IMPLANT
KIT BASIN OR (CUSTOM PROCEDURE TRAY) ×2 IMPLANT
KIT TURNOVER KIT B (KITS) ×2 IMPLANT
NS IRRIG 1000ML POUR BTL (IV SOLUTION) ×2 IMPLANT
PACK SURGICAL SETUP 50X90 (CUSTOM PROCEDURE TRAY) ×2 IMPLANT
PAD ARMBOARD 7.5X6 YLW CONV (MISCELLANEOUS) ×4 IMPLANT
PENCIL BUTTON HOLSTER BLD 10FT (ELECTRODE) ×2 IMPLANT
SPONGE INTESTINAL PEANUT (DISPOSABLE) IMPLANT
STAPLER VISISTAT 35W (STAPLE) IMPLANT
SUT SILK 2 0 TIES 10X30 (SUTURE) IMPLANT
SUT VIC AB 2-0 CT1 27 (SUTURE) ×1
SUT VIC AB 2-0 CT1 TAPERPNT 27 (SUTURE) ×1 IMPLANT
SUT VIC AB 3-0 SH 27 (SUTURE) ×1
SUT VIC AB 3-0 SH 27XBRD (SUTURE) ×1 IMPLANT
SUT VIC AB 3-0 SH 8-18 (SUTURE) ×2 IMPLANT
SUT VIC AB 3-0 X1 27 (SUTURE) ×2 IMPLANT
SYR 10ML LL (SYRINGE) ×2 IMPLANT
SYSTEM SAHARA CHEST DRAIN RE-I (WOUND CARE) IMPLANT
TAPE CLOTH SURG 4X10 WHT LF (GAUZE/BANDAGES/DRESSINGS) ×2 IMPLANT
TOWEL GREEN STERILE (TOWEL DISPOSABLE) ×2 IMPLANT
TOWEL GREEN STERILE FF (TOWEL DISPOSABLE) ×2 IMPLANT
TUBE CONNECTING 12X1/4 (SUCTIONS) ×2 IMPLANT
WATER STERILE IRR 1000ML POUR (IV SOLUTION) ×2 IMPLANT

## 2018-12-02 NOTE — Progress Notes (Signed)
Patient transferred to Short Stay to prep for procedure.

## 2018-12-02 NOTE — Progress Notes (Signed)
Patient transferred to CT

## 2018-12-02 NOTE — Transfer of Care (Signed)
Immediate Anesthesia Transfer of Care Note  Patient: [name] [name]  Procedure(s) Performed: LEFT NECK EXPLORATION (Left Neck)  Patient Location: PACU  Anesthesia Type:MAC  Level of Consciousness: awake, alert  and oriented  Airway & Oxygen Therapy: Patient Spontanous Breathing and Patient connected to face mask oxygen  Post-op Assessment: Report given to RN and Post -op Vital signs reviewed and stable  Post vital signs: Reviewed and stable  Last Vitals:  Vitals Value Taken Time  BP 119/69 12/02/18 1521  Temp    Pulse 73 12/02/18 1521  Resp 25 12/02/18 1521  SpO2 98 % 12/02/18 1521  Vitals shown include unvalidated device data.  Last Pain:  Vitals:   12/02/18 1200  TempSrc:   PainSc: Asleep      Patients Stated Pain Goal: 0 (09/40/76 8088)  Complications: No apparent anesthesia complications

## 2018-12-02 NOTE — Op Note (Signed)
NAME: SIDI, DZIKOWSKI MEDICAL RECORD AU:[id number] ACCOUNT [id number] DATE OF BIRTH:[birth year] FACILITY: [facility] LOCATION: [facility] PHYSICIAN:Jolea Dolle Maryruth Bun, MD  OPERATIVE REPORT  DATE OF PROCEDURE:  12/02/2018  PREOPERATIVE DIAGNOSIS:  Esophageal leak status post transhiatal total esophagectomy with cervical esophagogastrostomy.  POSTOPERATIVE DIAGNOSIS:  Esophageal leak status post transhiatal total esophagectomy with cervical esophagogastrostomy.  SURGICAL PROCEDURE:  Exploration of left neck and drainage.  SURGEON:  [name], MD  BRIEF HISTORY:  The patient is a 68 year old male who underwent transhiatal total esophagectomy for extensive adenocarcinoma of the esophagus.  The patient had a cervical esophagogastrostomy done.  Postoperatively, he has been doing well and without  signs of infection or drainage from his neck incision 1 week postop.  Today, he had a Gastrografin swallow that suggested an area of anastomotic leak with hangup of contrast, but quickly after his swallow, a limited soft tissue CT of the neck was done  which was highly suggestive of an anastomotic leak.  For this reason, the patient was continued to be kept n.p.o. and was taken to the operating room for exploration of the left neck and opening of the incision.  Even at this time, no drainage from the  cervical drain was noted.  DESCRIPTION OF PROCEDURE:  With appropriate timeout performed, we then proceeded with MAC anesthesia.  The patient's left neck was prepped with Betadine, draped in a sterile manner.  With the patient comfortable, approximately 10 mL of 1% lidocaine was  infiltrated in the neck incision and into the deeper muscles.  The previous incision was reopened and with slight retraction of the sternocleidomastoid muscle, the Penrose drain was located and dissection was carried down into the posterior cervical  area.  There was an area of drainage in this area noted and a small cavity that  was opened.  This provided good drainage.  The Penrose drain was removed.  The wound was then packed with loose saline soaked, Kerlix gauze.  Dry dressings were applied.    The patient tolerated the procedure without complication.    Sponge and needle count was reported as correct with the exception of the purposely left packing in the wound.    Blood loss was minimal.    The patient was returned to the recovery room in good condition.  AN/NUANCE  D:12/02/2018 T:12/02/2018 JOB:[id number]

## 2018-12-02 NOTE — Progress Notes (Signed)
Patient ID: [name] [name], male   DOB: July 26, 1950, 68 y.o.   MRN: [id number]       [address]       [place] [address]             [phone]     Dg Chest 2 View  Result Date: 12/02/2018 CLINICAL DATA:  POST OP TRANSHIATAL TOTAL ESOPHAGECTOMY EXAM: CHEST - 2 VIEW COMPARISON:  Chest x-rays dated 11/30/2018, 11/29/2018 and 11/28/2018. FINDINGS: Heart size and mediastinal contours are stable. Improved aeration at the lung bases bilaterally, compatible with resolving atelectasis and/or pleural effusions. Stable RIGHT apical pneumothorax, small to moderate in size. Stable small LEFT apical pneumothorax. IMPRESSION: 1. Stable bilateral pneumothoraces, small to to moderate in size on the RIGHT and small in size on the LEFT. 2. Improved aeration at the lung bases bilaterally, compatible with resolving atelectasis and/or resolving pleural effusions. Electronically Signed   By: Franki Cabot M.D.   On: 12/02/2018 09:51   Ct Soft Tissue Neck Wo Contrast  Result Date: 12/02/2018 CLINICAL DATA:  Status post esophagectomy with esophagogastroscopy EXAM: CT NECK WITHOUT CONTRAST TECHNIQUE: Multidetector CT imaging of the neck was performed following the standard protocol without intravenous contrast. COMPARISON:  08/09/2018 FINDINGS: Pharynx and larynx: Normal. No mass or swelling. Salivary glands: No inflammation, mass, or stone. Thyroid: Normal. Lymph nodes: None enlarged or abnormal density. Vascular: Calcified atherosclerosis noted within the left common carotid. Limited intracranial: Negative. Visualized orbits: Negative. Mastoids and visualized paranasal sinuses: Mastoid air cells are clear. There is chronic mucoperiosteal thickening involving the left maxillary sinus with partial opacification of the left anterior ethmoid air cells. Skeleton: No acute or aggressive process. Upper chest: Postoperative changes from esophagectomy and esophagogastrostomy. There is a surgical drainage  catheter identified within the soft tissues of the left side of neck. Along the left lateral and left posterior aspect of the anastomosis there is an apparent defect in the wall of the esophagogastrostomy, image 48/7 and image 100/8. This communicates with an extraluminal collection of gas and contrast material which measures 3.7 by 2.4 by 1.3 cm. The percutaneous drainage catheter directly communicates with this collection of gas. Arising from this extraluminal collection of gas is a small focus of gas posterior to the trachea, image 43/7 and image 53/6. This measures approximately 1.6 x 1.5 by 0.5 cm. Separately, there is a focus of gas within the soft tissues deep to the left sternocleidomastoid muscle at the level where the percutaneous drainage catheter enters the neck. Other: Bilateral hydro pneumothoraces are noted. IMPRESSION: 1. A defect within the left lateral and posterior wall of the esophagus gastrostomy is noted at the level of the anastomosis. There is an associated extraluminal collection of gas and a small volume of enteric contrast material which communicates with the left-sided percutaneous drainage catheter. A small focus of soft tissue gas communicates with this collection and extends posterior to the trachea. 2. Bilateral hydro pneumothoraces. 3. Chronic left maxillary sinus inflammation. Electronically Signed   By: Kerby Moors M.D.   On: 12/02/2018 12:16   Dg Esophagus W Single Cm (sol Or Thin Ba)  Result Date: 12/02/2018 CLINICAL DATA:  Status post esophagectomy with cervical esophagogastrostomy EXAM: ESOPHOGRAM/BARIUM SWALLOW TECHNIQUE: Single contrast examination was performed using thin barium or water soluble. FLUOROSCOPY TIME:  Fluoroscopy Time:  3 minutes 12 seconds Radiation Exposure Index (if provided by the fluoroscopic device): 147.5 mGy Number of Acquired Spot Images: 0 COMPARISON:  None. FINDINGS: 100 cc Omnipaque 300 were  ingested by the patient. Postoperative changes from  esophagectomy with esophagogastrostomy identified. The intrathoracic stomach appears patent with contrast traveling below the level of the diaphragm into the proximal small bowel. At the level of the proximal anastomosis there is a focal area of persistent and progressive contrast accumulation measuring approximately 2.6 cm in length with a diameter of 0.8 cm. This is worrisome for extraluminal accumulation of water-soluble contrast material secondary to leak at the level of the proximal anastomosis. IMPRESSION: 1. Persistent and progressive accumulation of water-soluble contrast material is identified at thoracic inlet near the level of the anastomosis. Finding is worrisome for leak at/or around the anastomosis. Recommend further evaluation with CT of the chest to identify location of contrast collection. Electronically Signed   By: Kerby Moors M.D.   On: 12/02/2018 10:57    CT and swallow reviewed and discussed with the patient and radiology. Appears as anastomotic leak. Will take to OR with opening and exploration of the left neck incision and drainage . Dx risks and options of procedure discussed with patient and with his wife the on phone.  [name] MD      [address] [place] [address] Office [phone]   [place]

## 2018-12-02 NOTE — Progress Notes (Signed)
T[facility] BRIEF SICU PROGRESS NOTE  Day of Surgery  S/P Procedure(s) (LRB): LEFT NECK EXPLORATION (Left)   Stable after return from OR NSR w/ stable BP Breathing comfortably on room air  Plan: Continue current plan  Rexene Alberts, MD 12/02/2018 6:10 PM

## 2018-12-02 NOTE — Anesthesia Preprocedure Evaluation (Signed)
Anesthesia Evaluation  Patient identified by MRN, date of birth, ID band Patient awake    Reviewed: Allergy & Precautions, NPO status , Patient's Chart, lab work & pertinent test results  Airway Mallampati: II  TM Distance: >3 FB Neck ROM: Full    Dental  (+) Teeth Intact, Dental Advisory Given   Pulmonary former smoker,    breath sounds clear to auscultation       Cardiovascular hypertension,  Rhythm:Regular Rate:Normal     Neuro/Psych    GI/Hepatic   Endo/Other    Renal/GU      Musculoskeletal   Abdominal   Peds  Hematology   Anesthesia Other Findings   Reproductive/Obstetrics                             Anesthesia Physical  Anesthesia Plan  ASA: III and emergent  Anesthesia Plan: General   Post-op Pain Management:    Induction: Intravenous, Rapid sequence and Cricoid pressure planned  PONV Risk Score and Plan: 2 and Ondansetron, Midazolam and Treatment may vary due to age or medical condition  Airway Management Planned: Oral ETT  Additional Equipment:   Intra-op Plan:   Post-operative Plan: Post-operative intubation/ventilation  Informed Consent: I have reviewed the patients History and Physical, chart, labs and discussed the procedure including the risks, benefits and alternatives for the proposed anesthesia with the patient or authorized representative who has indicated his/her understanding and acceptance.     Dental advisory given  Plan Discussed with: Anesthesiologist and CRNA  Anesthesia Plan Comments: (Seen by Dr. Angelena Form for preop cardiovascular risk stratification 10/28/18. Per note "Risk factors for CAD include age, FH of CAD, smoking, HTN and hyperlipidemia. He has no symptoms worrisome for angina or CHF but given his risk factors for CAD, a nuclear stress test will be arranged prior to his major esophageal surgery in June 2020 to exclude ischemia." Nuclear  stress 11/04/18 showed: Normal resting and stress perfusion. No ischemia or infarction EF 55% some thinning of inferior wall thought to be from diaphragm.)        Anesthesia Quick Evaluation

## 2018-12-02 NOTE — Progress Notes (Signed)
Patient transferred to Xray

## 2018-12-02 NOTE — Anesthesia Procedure Notes (Signed)
Procedure Name: MAC Date/Time: 12/02/2018 2:45 PM Performed by: Jearld Pies, CRNA Pre-anesthesia Checklist: Patient being monitored, Suction available, Emergency Drugs available and Patient identified Patient Re-evaluated:Patient Re-evaluated prior to induction Oxygen Delivery Method: Simple face mask Preoxygenation: Pre-oxygenation with 100% oxygen

## 2018-12-02 NOTE — Brief Op Note (Signed)
12/02/2018  3:16 PM  PATIENT:  [name] [name]  68 y.o. male  PRE-OPERATIVE DIAGNOSIS:  ESOPHAGEAL anatomatic leak   POST-OPERATIVE DIAGNOSIS:  Same   PROCEDURE:  Procedure(s): LEFT NECK EXPLORATION (Left) and drainage   SURGEON:  Surgeon(s) and Role:    * [name], MD - Primary    ANESTHESIA:   MAC  EBL:  10 mL   BLOOD ADMINISTERED:none  DRAINS: none   LOCAL MEDICATIONS USED:  LIDOCAINE  and Amount: 10 ml  SPECIMEN:  No Specimen  DISPOSITION OF SPECIMEN:  none  COUNTS:  YES   DICTATION:  Dictation   PLAN OF CARE: inpatient   PATIENT DISPOSITION:  inpatient   Delay start of Pharmacological VTE agent (>24hrs) due to surgical blood loss or risk of bleeding: yes

## 2018-12-02 NOTE — Progress Notes (Signed)
Patient ID: [name] [name], male   DOB: 06-23-1950, 68 y.o.   MRN: [id number] T[facility] DAILY ICU PROGRESS NOTE                   [address]            [place] [address]          [phone]   7 Days Post-Op Procedure(s) (LRB): VIDEO BRONCHOSCOPY (N/A) TRANSHIATAL TOTAL ESOPHAGECTOMY COMPLETE WITH CERVICAL ESOPHAGOGASTROSTOMY (N/A) FEEDING JEJUNOSTOMY (N/A) Pyloroplasty  Total Length of Stay:  LOS: 7 days   Subjective: Alert and ambulating well, for esophageal swallow today   Objective: Vital signs in last 24 hours: Temp:  [98.1 F (36.7 C)-99.7 F (37.6 C)] 98.1 F (36.7 C) (06/15 0700) Pulse Rate:  [60-103] 72 (06/15 0600) Cardiac Rhythm: Normal sinus rhythm (06/15 0400) BP: (104-138)/(60-87) 132/71 (06/15 0600) SpO2:  [94 %-100 %] 96 % (06/15 0600) Weight:  [76.7 kg] 76.7 kg (06/15 0500)  Filed Weights   11/30/18 0500 12/01/18 0500 12/02/18 0500  Weight: 75.7 kg 76.4 kg 76.7 kg    Weight change: 0.3 kg   Hemodynamic parameters for last 24 hours:    Intake/Output from previous day: 06/14 0701 - 06/15 0700 In: 197.8 [I.V.:97.8] Out: -   Intake/Output this shift: No intake/output data recorded.  Current Meds: Scheduled Meds: . bethanechol  10 mg Oral QID  . Chlorhexidine Gluconate Cloth  6 each Topical Daily  . enoxaparin (LOVENOX) injection  40 mg Subcutaneous Q24H  . feeding supplement (PRO-STAT SUGAR FREE 64)  30 mL Per Tube BID  . insulin aspart  0-24 Units Subcutaneous Q4H  . mouth rinse  15 mL Mouth Rinse BID  . pantoprazole (PROTONIX) IV  40 mg Intravenous Q12H  . sodium chloride flush  10-40 mL Intracatheter Q12H   Continuous Infusions: . dexmedetomidine (PRECEDEX) IV infusion Stopped (11/25/18 1939)  . dextrose 5 % and 0.45% NaCl Stopped (12/01/18 1051)  . feeding supplement (OSMOLITE 1.5 CAL) 1,000 mL (12/01/18 1205)  . potassium chloride Stopped (11/30/18 1319)   PRN Meds:.oxyCODONE **AND** acetaminophen, diphenhydrAMINE  **OR** diphenhydrAMINE, morphine injection, naloxone **AND** sodium chloride flush, ondansetron (ZOFRAN) IV, ondansetron (ZOFRAN) IV, potassium chloride, sodium chloride flush  General appearance: alert, cooperative and no distress Neurologic: intact Heart: regular rate and rhythm, S1, S2 normal, no murmur, click, rub or gallop Lungs: clear to auscultation bilaterally Abdomen: soft, non-tender; bowel sounds normal; no masses,  no organomegaly Extremities: extremities normal, atraumatic, no cyanosis or edema Wound: wounds intact, left neck drain- intact no drainage   Lab Results: CBC: Recent Labs    12/01/18 0420 12/02/18 0316  WBC 9.9 12.2*  HGB 10.1* 11.0*  HCT 30.9* 33.5*  PLT 231 259   BMET:  Recent Labs    12/01/18 0420 12/02/18 0316  NA 135 136  K 3.8 5.0  CL 103 104  CO2 24 23  GLUCOSE 121* 100*  BUN 15 16  CREATININE 0.55* 0.76  CALCIUM 8.1* 8.3*    CMET: Lab Results  Component Value Date   WBC 12.2 (H) 12/02/2018   HGB 11.0 (L) 12/02/2018   HCT 33.5 (L) 12/02/2018   PLT 259 12/02/2018   GLUCOSE 100 (H) 12/02/2018   CHOL 161 05/06/2018   TRIG 134.0 05/06/2018   HDL 54.20 05/06/2018   LDLDIRECT 131.7 12/27/2009   LDLCALC 80 05/06/2018   ALT 14 11/22/2018   AST 18 11/22/2018   NA 136 12/02/2018   K 5.0 12/02/2018  CL 104 12/02/2018   CREATININE 0.76 12/02/2018   BUN 16 12/02/2018   CO2 23 12/02/2018   TSH 1.24 05/06/2018   PSA 1.49 05/06/2018   INR 1.1 11/22/2018   HGBA1C 5.4 05/06/2018   MICROALBUR 1.1 02/25/2013      PT/INR: No results for input(s): LABPROT, INR in the last 72 hours. Radiology: No results found.   Assessment/Plan: S/P Procedure(s) (LRB): VIDEO BRONCHOSCOPY (N/A) TRANSHIATAL TOTAL ESOPHAGECTOMY COMPLETE WITH CERVICAL ESOPHAGOGASTROSTOMY (N/A) FEEDING JEJUNOSTOMY (N/A) Pyloroplasty Urinary retention resolved, now voiding, foley out on urecholine    Waiting for swallow study  today  To step down when bed available    Cancer Staging Adenocarcinoma of gastroesophageal junction [facility]) Staging form: Esophagus - Adenocarcinoma, AJCC 8th Edition - Pathologic stage from 11/28/2018: Stage IVA (ypT4a, pN2, cM0, G2) - Signed by [name], MD on 12/01/2018   [name] 12/02/2018 7:56 AM

## 2018-12-03 ENCOUNTER — Encounter (HOSPITAL_COMMUNITY): Payer: Self-pay | Admitting: Cardiothoracic Surgery

## 2018-12-03 LAB — GLUCOSE, CAPILLARY
Glucose-Capillary: 106 mg/dL — ABNORMAL HIGH (ref 70–99)
Glucose-Capillary: 120 mg/dL — ABNORMAL HIGH (ref 70–99)
Glucose-Capillary: 110 mg/dL — ABNORMAL HIGH (ref 70–99)
Glucose-Capillary: 96 mg/dL (ref 70–99)
Glucose-Capillary: 92 mg/dL (ref 70–99)
Glucose-Capillary: 104 mg/dL — ABNORMAL HIGH (ref 70–99)

## 2018-12-03 MED ORDER — OSMOLITE 1.5 CAL PO LIQD
1000.0000 mL | ORAL | Status: DC
  Administered 2018-12-03 – 2018-12-16 (×15): 1000 mL
  Filled 2018-12-03 (×27): qty 1000

## 2018-12-03 MED ORDER — DIPHENHYDRAMINE HCL 25 MG PO CAPS
25.0000 mg | ORAL_CAPSULE | Freq: Every day | ORAL | Status: DC
  Administered 2018-12-03 – 2018-12-05 (×3): 25 mg
  Filled 2018-12-03 (×4): qty 1

## 2018-12-03 NOTE — Progress Notes (Addendum)
T[facility] DAILY ICU PROGRESS NOTE                   [address]            [place] [address]          [phone]   1 Day Post-Op Procedure(s) (LRB): LEFT NECK EXPLORATION (Left)  Total Length of Stay:  LOS: 8 days   Subjective: Walked 10 laps around the unit last night. Sometimes he feels restless and walking helps.   Objective: Vital signs in last 24 hours: Temp:  [98.4 F (36.9 C)-99.5 F (37.5 C)] 98.9 F (37.2 C) (06/16 0300) Pulse Rate:  [69-93] 69 (06/16 0700) Cardiac Rhythm: Normal sinus rhythm (06/16 0400) Resp:  [21-22] 22 (06/15 1535) BP: (113-137)/(59-108) 113/66 (06/16 0700) SpO2:  [92 %-100 %] 97 % (06/16 0700) Weight:  [76.6 kg] 76.6 kg (06/16 0400)  Filed Weights   12/01/18 0500 12/02/18 0500 12/03/18 0400  Weight: 76.4 kg 76.7 kg 76.6 kg    Weight change: -0.1 kg      Intake/Output from previous day: 06/15 0701 - 06/16 0700 In: 1855.2 [I.V.:445.2; NG/GT:900; IV Piggyback:150.1] Out: 10 [Blood:10]  Intake/Output this shift: No intake/output data recorded.  Current Meds: Scheduled Meds:  bethanechol  10 mg Oral QID   Chlorhexidine Gluconate Cloth  6 each Topical Daily   diphenhydrAMINE  25 mg Per Tube QHS   enoxaparin (LOVENOX) injection  40 mg Subcutaneous Q24H   feeding supplement (PRO-STAT SUGAR FREE 64)  30 mL Per Tube BID   insulin aspart  0-24 Units Subcutaneous Q4H   mouth rinse  15 mL Mouth Rinse BID   pantoprazole (PROTONIX) IV  40 mg Intravenous Q12H   sodium chloride flush  10-40 mL Intracatheter Q12H   Continuous Infusions:  cefOXitin (MEFOXIN) 1 GM IVPB Stopped (12/03/18 0533)   dextrose 5 % and 0.45% NaCl 10 mL/hr at 12/03/18 0700   feeding supplement (OSMOLITE 1.5 CAL) 50 mL/hr at 12/03/18 0600   potassium chloride Stopped (11/30/18 1319)   PRN Meds:.oxyCODONE **AND** acetaminophen, diphenhydrAMINE **OR** diphenhydrAMINE, morphine injection, naloxone **AND** sodium chloride flush, ondansetron  (ZOFRAN) IV, ondansetron (ZOFRAN) IV, potassium chloride, sodium chloride flush  General appearance: alert, cooperative and no distress Heart: regular rate and rhythm, S1, S2 normal, no murmur, click, rub or gallop Lungs: clear to auscultation bilaterally Abdomen: soft, non-tender; bowel sounds normal; no masses,  no organomegaly Extremities: 1-2+ pitting pedal edema Wound: open neck incision which is losely packed and covered with a dry 4 x 4 dressing.   Lab Results: CBC: Recent Labs    12/01/18 0420 12/02/18 0316  WBC 9.9 12.2*  HGB 10.1* 11.0*  HCT 30.9* 33.5*  PLT 231 259   BMET:  Recent Labs    12/01/18 0420 12/02/18 0316  NA 135 136  K 3.8 5.0  CL 103 104  CO2 24 23  GLUCOSE 121* 100*  BUN 15 16  CREATININE 0.55* 0.76  CALCIUM 8.1* 8.3*    CMET: Lab Results  Component Value Date   WBC 12.2 (H) 12/02/2018   HGB 11.0 (L) 12/02/2018   HCT 33.5 (L) 12/02/2018   PLT 259 12/02/2018   GLUCOSE 100 (H) 12/02/2018   CHOL 161 05/06/2018   TRIG 134.0 05/06/2018   HDL 54.20 05/06/2018   LDLDIRECT 131.7 12/27/2009   LDLCALC 80 05/06/2018   ALT 14 11/22/2018   AST 18 11/22/2018   NA 136 12/02/2018   K 5.0 12/02/2018  CL 104 12/02/2018   CREATININE 0.76 12/02/2018   BUN 16 12/02/2018   CO2 23 12/02/2018   TSH 1.24 05/06/2018   PSA 1.49 05/06/2018   INR 1.1 11/22/2018   HGBA1C 5.4 05/06/2018   MICROALBUR 1.1 02/25/2013      PT/INR: No results for input(s): LABPROT, INR in the last 72 hours. Radiology: Dg Chest 2 View  Result Date: 12/02/2018 CLINICAL DATA:  POST OP TRANSHIATAL TOTAL ESOPHAGECTOMY EXAM: CHEST - 2 VIEW COMPARISON:  Chest x-rays dated 11/30/2018, 11/29/2018 and 11/28/2018. FINDINGS: Heart size and mediastinal contours are stable. Improved aeration at the lung bases bilaterally, compatible with resolving atelectasis and/or pleural effusions. Stable RIGHT apical pneumothorax, small to moderate in size. Stable small LEFT apical pneumothorax.  IMPRESSION: 1. Stable bilateral pneumothoraces, small to to moderate in size on the RIGHT and small in size on the LEFT. 2. Improved aeration at the lung bases bilaterally, compatible with resolving atelectasis and/or resolving pleural effusions. Electronically Signed   By: Franki Cabot M.D.   On: 12/02/2018 09:51   Ct Soft Tissue Neck Wo Contrast  Result Date: 12/02/2018 CLINICAL DATA:  Status post esophagectomy with esophagogastroscopy EXAM: CT NECK WITHOUT CONTRAST TECHNIQUE: Multidetector CT imaging of the neck was performed following the standard protocol without intravenous contrast. COMPARISON:  08/09/2018 FINDINGS: Pharynx and larynx: Normal. No mass or swelling. Salivary glands: No inflammation, mass, or stone. Thyroid: Normal. Lymph nodes: None enlarged or abnormal density. Vascular: Calcified atherosclerosis noted within the left common carotid. Limited intracranial: Negative. Visualized orbits: Negative. Mastoids and visualized paranasal sinuses: Mastoid air cells are clear. There is chronic mucoperiosteal thickening involving the left maxillary sinus with partial opacification of the left anterior ethmoid air cells. Skeleton: No acute or aggressive process. Upper chest: Postoperative changes from esophagectomy and esophagogastrostomy. There is a surgical drainage catheter identified within the soft tissues of the left side of neck. Along the left lateral and left posterior aspect of the anastomosis there is an apparent defect in the wall of the esophagogastrostomy, image 48/7 and image 100/8. This communicates with an extraluminal collection of gas and contrast material which measures 3.7 by 2.4 by 1.3 cm. The percutaneous drainage catheter directly communicates with this collection of gas. Arising from this extraluminal collection of gas is a small focus of gas posterior to the trachea, image 43/7 and image 53/6. This measures approximately 1.6 x 1.5 by 0.5 cm. Separately, there is a focus of gas  within the soft tissues deep to the left sternocleidomastoid muscle at the level where the percutaneous drainage catheter enters the neck. Other: Bilateral hydro pneumothoraces are noted. IMPRESSION: 1. A defect within the left lateral and posterior wall of the esophagus gastrostomy is noted at the level of the anastomosis. There is an associated extraluminal collection of gas and a small volume of enteric contrast material which communicates with the left-sided percutaneous drainage catheter. A small focus of soft tissue gas communicates with this collection and extends posterior to the trachea. 2. Bilateral hydro pneumothoraces. 3. Chronic left maxillary sinus inflammation. Electronically Signed   By: Kerby Moors M.D.   On: 12/02/2018 12:16   Dg Esophagus W Single Cm (sol Or Thin Ba)  Result Date: 12/02/2018 CLINICAL DATA:  Status post esophagectomy with cervical esophagogastrostomy EXAM: ESOPHOGRAM/BARIUM SWALLOW TECHNIQUE: Single contrast examination was performed using thin barium or water soluble. FLUOROSCOPY TIME:  Fluoroscopy Time:  3 minutes 12 seconds Radiation Exposure Index (if provided by the fluoroscopic device): 147.5 mGy Number of Acquired Spot Images:  0 COMPARISON:  None. FINDINGS: 100 cc Omnipaque 300 were ingested by the patient. Postoperative changes from esophagectomy with esophagogastrostomy identified. The intrathoracic stomach appears patent with contrast traveling below the level of the diaphragm into the proximal small bowel. At the level of the proximal anastomosis there is a focal area of persistent and progressive contrast accumulation measuring approximately 2.6 cm in length with a diameter of 0.8 cm. This is worrisome for extraluminal accumulation of water-soluble contrast material secondary to leak at the level of the proximal anastomosis. IMPRESSION: 1. Persistent and progressive accumulation of water-soluble contrast material is identified at thoracic inlet near the level of  the anastomosis. Finding is worrisome for leak at/or around the anastomosis. Recommend further evaluation with CT of the chest to identify location of contrast collection. Electronically Signed   By: Kerby Moors M.D.   On: 12/02/2018 10:57     Assessment/Plan: S/P Procedure(s) (LRB): LEFT NECK EXPLORATION (Left)  1. CV-NSR in the 70s, BP well controlled.  2. Pulm-tolerating room air with good oxygen saturation. Continue to use incentive spirometer.  3. Renal-creatinine 0.76, Hyperkalemia-holding potassium supplementation 4. H and H-11.0/33.5, expected acute blood loss anemia 5. Endo-blood gluocose is well controlled 6. GI-continue tube feedings. Only liquid through the j-tube. Failed swallow study and back to the OR yesterday for an anastomosis leak.  7. Continue Mefoxin for 5 dosess  8. Lovenox for DVT prophylaxis  Plan: Continue daily dressing changes. Continue to encourage ambulation. Continue to use incentive spirometer.    [name] 12/03/2018 7:32 AM I have seen and examined [name] [name] and agree with the above assessment  and plan.  [name] MD Beeper [phone] Office [phone] 12/03/2018 5:18 PM

## 2018-12-03 NOTE — Anesthesia Postprocedure Evaluation (Signed)
Anesthesia Post Note  Patient: [name] [name]  Procedure(s) Performed: LEFT NECK EXPLORATION (Left Neck)     Patient location during evaluation: PACU Anesthesia Type: MAC Level of consciousness: awake and alert Pain management: pain level controlled Vital Signs Assessment: post-procedure vital signs reviewed and stable Respiratory status: spontaneous breathing, nonlabored ventilation and respiratory function stable Cardiovascular status: stable and blood pressure returned to baseline Postop Assessment: no apparent nausea or vomiting Anesthetic complications: no    Last Vitals:  Vitals:   12/03/18 0600 12/03/18 0700  BP: (!) 113/59   Pulse: 72 69  Resp:    Temp:    SpO2: 97% 97%    Last Pain:  Vitals:   12/03/18 0635  TempSrc:   PainSc: 0-No pain   Pain Goal: Patients Stated Pain Goal: 0 (12/03/18 0500)                 Lynda Rainwater

## 2018-12-03 NOTE — Progress Notes (Signed)
Patient ID: [name] [name], male   DOB: Apr 07, 1951, 68 y.o.   MRN: [id number] [facility] DAILY ICU PROGRESS NOTE                   [address]            [place] [address]          [phone]   1 Day Post-Op Procedure(s) (LRB): LEFT NECK EXPLORATION (Left)  Total Length of Stay:  LOS: 8 days   Subjective: Patient awake alert neurologically intact up in chair  Objective: Vital signs in last 24 hours: Temp:  [98.4 F (36.9 C)-99.5 F (37.5 C)] 98.7 F (37.1 C) (06/16 0728) Pulse Rate:  [69-93] 69 (06/16 0700) Cardiac Rhythm: Normal sinus rhythm (06/16 0400) Resp:  [21-22] 22 (06/15 1535) BP: (113-137)/(59-108) 113/66 (06/16 0700) SpO2:  [92 %-100 %] 97 % (06/16 0700) Weight:  [76.6 kg] 76.6 kg (06/16 0400)  Filed Weights   12/01/18 0500 12/02/18 0500 12/03/18 0400  Weight: 76.4 kg 76.7 kg 76.6 kg    Weight change: -0.1 kg   Hemodynamic parameters for last 24 hours:    Intake/Output from previous day: 06/15 0701 - 06/16 0700 In: 1855.2 [I.V.:445.2; NG/GT:900; IV Piggyback:150.1] Out: 10 [Blood:10]  Intake/Output this shift: No intake/output data recorded.  Current Meds: Scheduled Meds:  bethanechol  10 mg Oral QID   Chlorhexidine Gluconate Cloth  6 each Topical Daily   diphenhydrAMINE  25 mg Per Tube QHS   enoxaparin (LOVENOX) injection  40 mg Subcutaneous Q24H   feeding supplement (PRO-STAT SUGAR FREE 64)  30 mL Per Tube BID   insulin aspart  0-24 Units Subcutaneous Q4H   mouth rinse  15 mL Mouth Rinse BID   pantoprazole (PROTONIX) IV  40 mg Intravenous Q12H   sodium chloride flush  10-40 mL Intracatheter Q12H   Continuous Infusions:  cefOXitin (MEFOXIN) 1 GM IVPB Stopped (12/03/18 0533)   dextrose 5 % and 0.45% NaCl 10 mL/hr at 12/03/18 0700   feeding supplement (OSMOLITE 1.5 CAL) 50 mL/hr at 12/03/18 0600   potassium chloride Stopped (11/30/18 1319)   PRN Meds:.oxyCODONE **AND** acetaminophen, diphenhydrAMINE **OR**  diphenhydrAMINE, morphine injection, naloxone **AND** sodium chloride flush, ondansetron (ZOFRAN) IV, ondansetron (ZOFRAN) IV, potassium chloride, sodium chloride flush  General appearance: alert, cooperative and no distress Neurologic: intact Heart: regular rate and rhythm, S1, S2 normal, no murmur, click, rub or gallop Lungs: clear to auscultation bilaterally Abdomen: soft, non-tender; bowel sounds normal; no masses,  no organomegaly Extremities: extremities normal, atraumatic, no cyanosis or edema and Homans sign is negative, no sign of DVT Wound: Packing left neck is intact with some serous drainage overlying dressing change we will repacked later today  Lab Results: CBC: Recent Labs    12/01/18 0420 12/02/18 0316  WBC 9.9 12.2*  HGB 10.1* 11.0*  HCT 30.9* 33.5*  PLT 231 259   BMET:  Recent Labs    12/01/18 0420 12/02/18 0316  NA 135 136  K 3.8 5.0  CL 103 104  CO2 24 23  GLUCOSE 121* 100*  BUN 15 16  CREATININE 0.55* 0.76  CALCIUM 8.1* 8.3*    CMET: Lab Results  Component Value Date   WBC 12.2 (H) 12/02/2018   HGB 11.0 (L) 12/02/2018   HCT 33.5 (L) 12/02/2018   PLT 259 12/02/2018   GLUCOSE 100 (H) 12/02/2018   CHOL 161 05/06/2018   TRIG 134.0 05/06/2018   HDL 54.20 05/06/2018   LDLDIRECT 131.7  12/27/2009   LDLCALC 80 05/06/2018   ALT 14 11/22/2018   AST 18 11/22/2018   NA 136 12/02/2018   K 5.0 12/02/2018   CL 104 12/02/2018   CREATININE 0.76 12/02/2018   BUN 16 12/02/2018   CO2 23 12/02/2018   TSH 1.24 05/06/2018   PSA 1.49 05/06/2018   INR 1.1 11/22/2018   HGBA1C 5.4 05/06/2018   MICROALBUR 1.1 02/25/2013      PT/INR: No results for input(s): LABPROT, INR in the last 72 hours. Radiology: Dg Chest 2 View  Result Date: 12/02/2018 CLINICAL DATA:  POST OP TRANSHIATAL TOTAL ESOPHAGECTOMY EXAM: CHEST - 2 VIEW COMPARISON:  Chest x-rays dated 11/30/2018, 11/29/2018 and 11/28/2018. FINDINGS: Heart size and mediastinal contours are stable. Improved  aeration at the lung bases bilaterally, compatible with resolving atelectasis and/or pleural effusions. Stable RIGHT apical pneumothorax, small to moderate in size. Stable small LEFT apical pneumothorax. IMPRESSION: 1. Stable bilateral pneumothoraces, small to to moderate in size on the RIGHT and small in size on the LEFT. 2. Improved aeration at the lung bases bilaterally, compatible with resolving atelectasis and/or resolving pleural effusions. Electronically Signed   By: Franki Cabot M.D.   On: 12/02/2018 09:51   Ct Soft Tissue Neck Wo Contrast  Result Date: 12/02/2018 CLINICAL DATA:  Status post esophagectomy with esophagogastroscopy EXAM: CT NECK WITHOUT CONTRAST TECHNIQUE: Multidetector CT imaging of the neck was performed following the standard protocol without intravenous contrast. COMPARISON:  08/09/2018 FINDINGS: Pharynx and larynx: Normal. No mass or swelling. Salivary glands: No inflammation, mass, or stone. Thyroid: Normal. Lymph nodes: None enlarged or abnormal density. Vascular: Calcified atherosclerosis noted within the left common carotid. Limited intracranial: Negative. Visualized orbits: Negative. Mastoids and visualized paranasal sinuses: Mastoid air cells are clear. There is chronic mucoperiosteal thickening involving the left maxillary sinus with partial opacification of the left anterior ethmoid air cells. Skeleton: No acute or aggressive process. Upper chest: Postoperative changes from esophagectomy and esophagogastrostomy. There is a surgical drainage catheter identified within the soft tissues of the left side of neck. Along the left lateral and left posterior aspect of the anastomosis there is an apparent defect in the wall of the esophagogastrostomy, image 48/7 and image 100/8. This communicates with an extraluminal collection of gas and contrast material which measures 3.7 by 2.4 by 1.3 cm. The percutaneous drainage catheter directly communicates with this collection of gas. Arising  from this extraluminal collection of gas is a small focus of gas posterior to the trachea, image 43/7 and image 53/6. This measures approximately 1.6 x 1.5 by 0.5 cm. Separately, there is a focus of gas within the soft tissues deep to the left sternocleidomastoid muscle at the level where the percutaneous drainage catheter enters the neck. Other: Bilateral hydro pneumothoraces are noted. IMPRESSION: 1. A defect within the left lateral and posterior wall of the esophagus gastrostomy is noted at the level of the anastomosis. There is an associated extraluminal collection of gas and a small volume of enteric contrast material which communicates with the left-sided percutaneous drainage catheter. A small focus of soft tissue gas communicates with this collection and extends posterior to the trachea. 2. Bilateral hydro pneumothoraces. 3. Chronic left maxillary sinus inflammation. Electronically Signed   By: Kerby Moors M.D.   On: 12/02/2018 12:16   Dg Esophagus W Single Cm (sol Or Thin Ba)  Result Date: 12/02/2018 CLINICAL DATA:  Status post esophagectomy with cervical esophagogastrostomy EXAM: ESOPHOGRAM/BARIUM SWALLOW TECHNIQUE: Single contrast examination was performed using thin barium or  water soluble. FLUOROSCOPY TIME:  Fluoroscopy Time:  3 minutes 12 seconds Radiation Exposure Index (if provided by the fluoroscopic device): 147.5 mGy Number of Acquired Spot Images: 0 COMPARISON:  None. FINDINGS: 100 cc Omnipaque 300 were ingested by the patient. Postoperative changes from esophagectomy with esophagogastrostomy identified. The intrathoracic stomach appears patent with contrast traveling below the level of the diaphragm into the proximal small bowel. At the level of the proximal anastomosis there is a focal area of persistent and progressive contrast accumulation measuring approximately 2.6 cm in length with a diameter of 0.8 cm. This is worrisome for extraluminal accumulation of water-soluble contrast  material secondary to leak at the level of the proximal anastomosis. IMPRESSION: 1. Persistent and progressive accumulation of water-soluble contrast material is identified at thoracic inlet near the level of the anastomosis. Finding is worrisome for leak at/or around the anastomosis. Recommend further evaluation with CT of the chest to identify location of contrast collection. Electronically Signed   By: Kerby Moors M.D.   On: 12/02/2018 10:57     Assessment/Plan: 8 days status post transhiatal total esophagectomy 1 day status post left neck exploration Plan repacking left neck incision at bedside later today   [name] 12/03/2018 7:40 AM

## 2018-12-03 NOTE — Plan of Care (Signed)
  Problem: Coping: Goal: Level of anxiety will decrease Outcome: Adequate for Discharge   Problem: Safety: Goal: Ability to remain free from injury will improve Outcome: Adequate for Discharge   Problem: Skin Integrity: Goal: Risk for impaired skin integrity will decrease Outcome: Adequate for Discharge   Problem: Bowel/Gastric: Goal: Gastrointestinal status for postoperative course will improve Outcome: Adequate for Discharge   Problem: Skin Integrity: Goal: Demonstration of wound healing without infection will improve Outcome: Adequate for Discharge

## 2018-12-03 NOTE — Progress Notes (Signed)
Nutrition Follow-up  DOCUMENTATION CODES:   Non-severe (moderate) malnutrition in context of chronic illness  INTERVENTION:   Increase tube feeding to goal rate:  -Osmolite 1.5 @ 60 ml/hr via J-tube (1440 ml) -30 ml Prostat BID  TF provides: 2360 kcals, 120 grams protein, 1097 ml free water. Meets 100% of needs.   NUTRITION DIAGNOSIS:   Moderate Malnutrition related to chronic illness, cancer and cancer related treatments as evidenced by mild muscle depletion, energy intake < 75% for > or equal to 1 month  Ongoing  GOAL:   Patient will meet greater than or equal to 90% of their needs  Met via TF  MONITOR:   Diet advancement, Skin, TF tolerance, Weight trends, Labs, I & O's  REASON FOR ASSESSMENT:   Consult Assessment of nutrition requirement/status, Enteral/tube feeding initiation and management  ASSESSMENT:   Patient with PMH significant for diverticulitis, HLD, HTN, and adenocarcinoma of GE junction (s/p chemotherapy/radiation). Presents this admission for surgical resection of esophageal cancer.   6/8- total esophagectomy with cervical esophagogastrostomy, J-tube placement  6/15- exploration of left neck and drainage   Pt discussed during ICU rounds and with RN.   Underwent swallow study yesterday which revealed anastomotic leak - taken back to OR. Tolerating Osmolite @ 50 ml/hr with 30 ml Prostat BID this am. Plan to increase to goal today. Swallowing function to be reassess. Will provide supplementation once able.   Admission weight: 73.2 kg Current weight 76.6 kg  I/O: +12,737 ml since admit UOP: 6 occurrences x 24 hrs  Drips: D5 in 1/2NS @ 10 ml/hr Medications: SS novolog Labs: CBG 72-130  Diet Order:   Diet Order    None      EDUCATION NEEDS:   Education needs have been addressed  Skin:  Skin Assessment: Skin Integrity Issues: Skin Integrity Issues:: Incisions Incisions: left neck, L/R chest, abdomen   Last BM:  6/16  Height:   Ht  Readings from Last 1 Encounters:  11/25/18 '5\' 11"'  (1.803 m)    Weight:   Wt Readings from Last 1 Encounters:  12/03/18 76.6 kg    Ideal Body Weight:  78.2 kg  BMI:  Body mass index is 23.55 kg/m.  Estimated Nutritional Needs:   Kcal:  2200-2400 kcal  Protein:  110-130 grams  Fluid:  >/= 2L/day    Mariana Single RD, LDN Clinical Nutrition Pager # - [phone]

## 2018-12-04 ENCOUNTER — Inpatient Hospital Stay (HOSPITAL_COMMUNITY): Payer: Medicare Other

## 2018-12-04 LAB — CBC
HCT: 33.1 % — ABNORMAL LOW (ref 39.0–52.0)
Hemoglobin: 10.7 g/dL — ABNORMAL LOW (ref 13.0–17.0)
MCH: 30.3 pg (ref 26.0–34.0)
MCHC: 32.3 g/dL (ref 30.0–36.0)
MCV: 93.8 fL (ref 80.0–100.0)
Platelets: 393 10*3/uL (ref 150–400)
RBC: 3.53 MIL/uL — ABNORMAL LOW (ref 4.22–5.81)
RDW: 13.2 % (ref 11.5–15.5)
WBC: 9.7 10*3/uL (ref 4.0–10.5)
nRBC: 0 % (ref 0.0–0.2)

## 2018-12-04 LAB — BASIC METABOLIC PANEL
Anion gap: 8 (ref 5–15)
BUN: 15 mg/dL (ref 8–23)
CO2: 24 mmol/L (ref 22–32)
Calcium: 8 mg/dL — ABNORMAL LOW (ref 8.9–10.3)
Chloride: 105 mmol/L (ref 98–111)
Creatinine, Ser: 0.63 mg/dL (ref 0.61–1.24)
GFR calc Af Amer: >60 mL/min (ref >60)
GFR calc non Af Amer: >60 mL/min (ref >60)
Glucose, Bld: 114 mg/dL — ABNORMAL HIGH (ref 70–99)
Potassium: 4 mmol/L (ref 3.5–5.1)
Sodium: 137 mmol/L (ref 135–145)

## 2018-12-04 LAB — GLUCOSE, CAPILLARY
Glucose-Capillary: 124 mg/dL — ABNORMAL HIGH (ref 70–99)
Glucose-Capillary: 98 mg/dL (ref 70–99)
Glucose-Capillary: 72 mg/dL (ref 70–99)
Glucose-Capillary: 116 mg/dL — ABNORMAL HIGH (ref 70–99)

## 2018-12-04 MED ORDER — BETHANECHOL CHLORIDE 10 MG PO TABS
10.0000 mg | ORAL_TABLET | Freq: Four times a day (QID) | ORAL | Status: DC
  Administered 2018-12-04 – 2018-12-05 (×8): 10 mg via ORAL
  Filled 2018-12-04 (×11): qty 1

## 2018-12-04 NOTE — Progress Notes (Addendum)
TCTS DAILY ICU PROGRESS NOTE                   [address]            [place] [address]          [phone]   2 Days Post-Op Procedure(s) (LRB): LEFT NECK EXPLORATION (Left)  Total Length of Stay:  LOS: 9 days   Subjective: Feels okay this morning. Walked a total of 80 laps yesterday. The walking helps keep him calm.  Objective: Vital signs in last 24 hours: Temp:  [98.5 F (36.9 C)-98.9 F (37.2 C)] 98.7 F (37.1 C) (06/17 0400) Pulse Rate:  [60-80] 65 (06/17 0600) Cardiac Rhythm: Normal sinus rhythm (06/17 0430) BP: (109-139)/(54-75) 110/61 (06/17 0600) SpO2:  [93 %-99 %] 95 % (06/17 0600) Weight:  [77 kg] 77 kg (06/17 0500)  Filed Weights   12/02/18 0500 12/03/18 0400 12/04/18 0500  Weight: 76.7 kg 76.6 kg 77 kg    Weight change: 0.4 kg      Intake/Output from previous day: 06/16 0701 - 06/17 0700 In: 1435.8 [I.V.:119.3; NG/GT:1216.3; IV Piggyback:100.2] Out: -   Intake/Output this shift: No intake/output data recorded.  Current Meds: Scheduled Meds: . bethanechol  10 mg Oral QID  . Chlorhexidine Gluconate Cloth  6 each Topical Daily  . diphenhydrAMINE  25 mg Per Tube QHS  . enoxaparin (LOVENOX) injection  40 mg Subcutaneous Q24H  . feeding supplement (PRO-STAT SUGAR FREE 64)  30 mL Per Tube BID  . insulin aspart  0-24 Units Subcutaneous Q4H  . mouth rinse  15 mL Mouth Rinse BID  . pantoprazole (PROTONIX) IV  40 mg Intravenous Q12H  . sodium chloride flush  10-40 mL Intracatheter Q12H   Continuous Infusions: . dextrose 5 % and 0.45% NaCl 10 mL/hr at 12/04/18 0500  . feeding supplement (OSMOLITE 1.5 CAL) 1,000 mL (12/03/18 1832)  . potassium chloride Stopped (11/30/18 1319)   PRN Meds:.oxyCODONE **AND** acetaminophen, diphenhydrAMINE **OR** diphenhydrAMINE, morphine injection, naloxone **AND** sodium chloride flush, ondansetron (ZOFRAN) IV, ondansetron (ZOFRAN) IV, potassium chloride, sodium chloride flush  General appearance:  alert, cooperative and no distress Heart: regular rate and rhythm, S1, S2 normal, no murmur, click, rub or gallop Lungs: clear to auscultation bilaterally Abdomen: soft, non-tender; bowel sounds normal; no masses,  no organomegaly Extremities: extremities normal, atraumatic, no cyanosis or edema Wound: neck incisions re-packed and dressed this morning  Lab Results: CBC: Recent Labs    12/02/18 0316  WBC 12.2*  HGB 11.0*  HCT 33.5*  PLT 259   BMET:  Recent Labs    12/02/18 0316 12/04/18 0418  NA 136 137  K 5.0 4.0  CL 104 105  CO2 23 24  GLUCOSE 100* 114*  BUN 16 15  CREATININE 0.76 0.63  CALCIUM 8.3* 8.0*    CMET: Lab Results  Component Value Date   WBC 12.2 (H) 12/02/2018   HGB 11.0 (L) 12/02/2018   HCT 33.5 (L) 12/02/2018   PLT 259 12/02/2018   GLUCOSE 114 (H) 12/04/2018   CHOL 161 05/06/2018   TRIG 134.0 05/06/2018   HDL 54.20 05/06/2018   LDLDIRECT 131.7 12/27/2009   LDLCALC 80 05/06/2018   ALT 14 11/22/2018   AST 18 11/22/2018   NA 137 12/04/2018   K 4.0 12/04/2018   CL 105 12/04/2018   CREATININE 0.63 12/04/2018   BUN 15 12/04/2018   CO2 24 12/04/2018   TSH 1.24 05/06/2018   PSA 1.49 05/06/2018  INR 1.1 11/22/2018   HGBA1C 5.4 05/06/2018   MICROALBUR 1.1 02/25/2013      PT/INR: No results for input(s): LABPROT, INR in the last 72 hours. Radiology: No results found.   Assessment/Plan: S/P Procedure(s) (LRB): LEFT NECK EXPLORATION (Left)  1. CV-NSR in the 70s-80s, BP well controlled.  2. Pulm-tolerating room air with good oxygen saturation. Continue to use incentive spirometer. CXR stable. Still appears to have bilateral pneumothoraces.  3. Renal-creatinine 0.63, Potassium better this morning 4. H and H-11.0/33.5, expected acute blood loss anemia 5. Endo-blood gluocose is well controlled 6. GI-continue tube feedings, nutrition followings. Only liquid through the j-tube. Failed swallow study and back to the OR on 6/15 for an anastomosis  leak and neck exploration. 7. Continue Mefoxin for 3 more doses 8. Lovenox for DVT prophylaxis  Plan: Continue to use incentive spirometer. Continue daily dressing changes. Oncology visited yesterday and spoke with the patient about outpatient follow-up.       [name] 12/04/2018 7:30 AM   Dressing /packing changed at bedside today , wound contracting I have seen and examined [name] [name] and agree with the above assessment  and plan.  [name] MD Beeper [phone] Office [phone] 12/04/2018 2:08 PM

## 2018-12-04 NOTE — Progress Notes (Signed)
      [address]       [place] [address]             [phone]      Asleep currently BP 101/65   Pulse 67   Temp 99.6 F (37.6 C) Comment: Let RN know about temp  Resp (!) 22   Ht 5\' 11"  (1.803 m)   Wt 77 kg   SpO2 97%   BMI 23.68 kg/m   No new issues today  Remo Lipps C. Roxan Hockey, MD [facility] [phone]

## 2018-12-05 ENCOUNTER — Other Ambulatory Visit: Payer: Self-pay | Admitting: Oncology

## 2018-12-05 LAB — GLUCOSE, CAPILLARY
Glucose-Capillary: 100 mg/dL — ABNORMAL HIGH (ref 70–99)
Glucose-Capillary: 101 mg/dL — ABNORMAL HIGH (ref 70–99)
Glucose-Capillary: 110 mg/dL — ABNORMAL HIGH (ref 70–99)
Glucose-Capillary: 110 mg/dL — ABNORMAL HIGH (ref 70–99)
Glucose-Capillary: 118 mg/dL — ABNORMAL HIGH (ref 70–99)
Glucose-Capillary: 139 mg/dL — ABNORMAL HIGH (ref 70–99)
Glucose-Capillary: 91 mg/dL (ref 70–99)
Glucose-Capillary: 94 mg/dL (ref 70–99)

## 2018-12-05 NOTE — Discharge Summary (Signed)
Physician Discharge Summary        [address]       [place] [address]             [phone]      Patient ID: [name] [name] MRN: [id number] DOB/AGE: February 22, 1951 68 y.o.  Admit date: 11/25/2018 Discharge date: 12/18/2018  Admission Diagnoses: Patient Active Problem List   Diagnosis Date Noted   Malnutrition of moderate degree 11/27/2018   Adenocarcinoma of gastroesophageal junction ([facility]) 07/29/2018   Anemia 05/08/2018   History of colonic polyps 06/02/2014   Pre-diabetes 02/24/2013   Hyperlipidemia 08/05/2009   Tobacco abuse 05/11/2009   BPH without obstruction/lower urinary tract symptoms 05/11/2009   Essential hypertension 04/08/2007    Discharge Diagnoses:  Active Problems:   Malnutrition of moderate degree   Discharged Condition: good  HPI:   [name] [name] 68 y.o. 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  The patient's disease first came to medical attention when he presented to his primary care doctor with new anemia, repeat labs confirmed anemia.  Patient denies any known observed blood in his stool.  He denies any loss of weight, he has noted some very mild discomfort with swallowing, but not to the extent of interfering with his diet   Colonoscopy and upper GI endoscopy were performed.  Upper GI endoscopy showed  A large, submucosal and ulcerating mass with bleeding and stigmata of recent bleeding was found in the distal esophagus, at the gastroesophageal junction and in the cardia, 38 cm from the incisors.  CT and PET scans have been done. 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  He notes early in his career in the 1980s he did do some work at the power plant that could have exposed him to asbestos.  Patient has been a smoker for  proximately half a pack a day for 50 years. He denies any previous history of reflux  ? Radiation beginning 08/21/2018 ? Cycle 1 weekly Taxol/carboplatin 08/21/2018 ? Cycle 2 weekly Taxol/carboplatin 08/27/2018 ? Cycle 3 weekly Taxol/carboplatin 09/03/2018 ? Cycle 4 weekly Taxol/carboplatin 09/10/2018             Cycle 5 weekly Taxol/carboplatin 09/17/2018  Patient notes that his chemotherapy  was completed March 31, last Radiation was April 7.  He has not been smoking for the last 3 months, has been doing reasonably well with his weight maintaining 150 to 154  pounds.  He has had vague epigastric discomfort and question of shortness of breath with increased activity, he notes this is not related to eating.   Since last seen he was seen for cardiology evaluation and a cardiac stress test was performed and he was cleared from a cardiac standpoint for surgery.  Hospital Course:   On 11/25/2018 [name] [name] underwent a transhiatal total esophagectomy with cervical esophagogastrostomy, pyloroplasty, feeding jejunostomy, and bilateral placement of chest tubes. The patient did well POD 1. We added a PCA for pain control. He continued to work on his incentive spirometer. His baseline creatinine was 0.80, but was slightly elevated. We avoided nephrotoxic agents. We removed the arterial line and decided to keep the chest tubes in place due to a small apical pneumothorax. POD 2 we discontinued his right chest tube. We started to trickle the tube feedings. His creatinine was back to  baseline. We started lovenox for DVT prophylaxis. He was weaned to 2L [place] oxygen support. There was a left pneumothorax on CXR therefore, we left the left chest tube in place. POD 3, we removed his foley catheter and he was unable to void. We continued to in-and-out cath the patient in hopes that urinary function would return. We added urecholine suspension for urinary retention which was successful after a few doses. We discontinued his  PCA pump and continued j-tube liquid pain medication. POD 6 we discontinued his central line and remove his foley catheter. Gastrografin swallow study performed POD 7. A CT of the neck was performed which showed an anastomotic leak in the neck. He was taken to the OR on 6/15 for a left neck incisions and drainage. POD 1 after his neck exploration he was up and walking. The incision was left open and we continued to pack the neck with wet-to-dry dressing. He was started on 5 doses of Mefoxin. Nutrition was consulted to optimize tube feedings. POD 2 we continued daily dressing changes. Oncology visited 6/16 and spoke to the patient about follow-up outpatient. POD 3 he continued to progress. He was stable to transfer to the stepdown unit for continued care. On 2C we continued with daily dressing changes. He continued to ambulate by himself multiple times a day. Insomnia continued to be an issue but we were limited to only a few medication to try that could be given orally. His swallow was performed on 6/29 showed a small leak. The left lower arm IV infiltrate resolved edema after elevation and heat therapy. He is medically ready for discharge with tube feedings at home. Home health was arranged for j-tube dressing changes and flushing. He will also need daily left neck incision changes performed. The incisions should be packed with sterile 2 inch wide gauze soaked in sterile saline and covered with dry 4 x 4 gauze dressing and tape. Once home health was obtained, the patient was stable for discharge home.    Consults: oncology  Significant Diagnostic Studies:    CLINICAL DATA:  Status post esophagectomy with cervical esophagogastrostomy  EXAM: ESOPHOGRAM/BARIUM SWALLOW  TECHNIQUE: Single contrast examination was performed using thin barium or water soluble.  FLUOROSCOPY TIME:  Fluoroscopy Time:  3 minutes 12 seconds  Radiation Exposure Index (if provided by the fluoroscopic device): 147.5  mGy  Number of Acquired Spot Images: 0  COMPARISON:  None.  FINDINGS: 100 cc Omnipaque 300 were ingested by the patient. Postoperative changes from esophagectomy with esophagogastrostomy identified. The intrathoracic stomach appears patent with contrast traveling below the level of the diaphragm into the proximal small bowel. At the level of the proximal anastomosis there is a focal area of persistent and progressive contrast accumulation measuring approximately 2.6 cm in length with a diameter of 0.8 cm. This is worrisome for extraluminal accumulation of water-soluble contrast material secondary to leak at the level of the proximal anastomosis.  IMPRESSION: 1. Persistent and progressive accumulation of water-soluble contrast material is identified at thoracic inlet near the level of the anastomosis. Finding is worrisome for leak at/or around the anastomosis. Recommend further evaluation with CT of the chest to identify location of contrast collection.   Electronically Signed   By: Kerby Moors M.D.   On: 12/02/2018 10:57   Treatments:   NAME: CORBIN, FALCK MEDICAL RECORD [id number] ACCOUNT [id number] DATE OF BIRTH:June 02, 1951 FACILITY: [facility] LOCATION: [facility] PHYSICIAN:EDWARD Maryruth Bun, MD  OPERATIVE REPORT  DATE OF PROCEDURE:  11/25/2018  PREOPERATIVE DIAGNOSIS:  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  SURGICAL PROCEDURE:  Video bronchoscopy, transhiatal total esophagectomy with cervical esophagogastrostomy, pyloroplasty, feeding jejunostomy, bilateral placement of chest tubes.  SURGEON:  [name], MD  FIRST ASSISTANT:  Nicholes Rough, PA-C  SECOND ASSISTANT:  Jadene Pierini, PA-C  BRIEF HISTORY:  The patient is a 68 year old male who had anemia and was evaluated and found to have a large distal esophageal  ulcer which showed adenocarcinoma.  Further evaluation showed a bulky mass at the GE junction indicating probable stage III  adenocarcinoma of the distal esophagus and GE junction.  The patient underwent a course of radiation and chemotherapy.  Followup CT scan did not show any further distal or distant metastases and some response to therapy.  The patient was referred for  consideration of esophagectomy.  Risks and options were discussed with the patient in detail, and he was agreeable and signed informed consent.   NAME: CORNELIOUS, BARTOLUCCI MEDICAL RECORD [id number] ACCOUNT [id number] DATE OF BIRTH:21-Feb-1951 FACILITY: [facility] LOCATION: [facility] PHYSICIAN:EDWARD Maryruth Bun, MD  OPERATIVE REPORT  DATE OF PROCEDURE:  12/02/2018  PREOPERATIVE DIAGNOSIS:  Esophageal leak status post transhiatal total esophagectomy with cervical esophagogastrostomy.  POSTOPERATIVE DIAGNOSIS:  Esophageal leak status post transhiatal total esophagectomy with cervical esophagogastrostomy.  SURGICAL PROCEDURE:  Exploration of left neck and drainage.  SURGEON:  [name], MD  BRIEF HISTORY:  The patient is a 68 year old male who underwent transhiatal total esophagectomy for extensive adenocarcinoma of the esophagus.  The patient had a cervical esophagogastrostomy done.  Postoperatively, he has been doing well and without  signs of infection or drainage from his neck incision 1 week postop.  Today, he had a Gastrografin swallow that suggested an area of anastomotic leak with hangup of contrast, but quickly after his swallow, a limited soft tissue CT of the neck was done  which was highly suggestive of an anastomotic leak.  For this reason, the patient was continued to be kept n.p.o. and was taken to the operating room for exploration of the left neck and opening of the incision.  Even at this time, no drainage from the  cervical drain was noted.    Discharge Exam: Blood pressure 124/66, pulse  60, temperature 98.1 F (36.7 C), temperature source Oral, resp. rate (!) 23, height 5\' 11"  (1.803 m), weight 74.4 kg, SpO2 99 %.   General appearance: alert, cooperative and no distress Heart: regular rate and rhythm, S1, S2 normal, no murmur, click, rub or gallop Lungs: clear to auscultation bilaterally Abdomen: soft, non-tender; bowel sounds normal; no masses,  no organomegaly Extremities: extremities normal, atraumatic, no cyanosis or edema Wound: clean and dry neck incision today. Dressed wet-to-dry  Disposition: Discharge disposition: 01-Home or Self Care        Allergies as of 12/18/2018   No Known Allergies     Medication List    STOP taking these medications   lisinopril-hydrochlorothiazide 20-25 MG tablet Commonly known as: ZESTORETIC     TAKE these medications   acetaminophen 160 MG/5ML solution Commonly known as: TYLENOL Take 10.2 mLs (325 mg total) by mouth every 4 (four) hours as needed for moderate pain.   feeding supplement (OSMOLITE 1.5 CAL) Liqd Place 1,000 mLs into feeding tube continuous for 21 days. Notes to patient: 12/18/18   feeding supplement (PRO-STAT SUGAR FREE 64) Liqd Place 30 mLs into feeding tube 2 (two) times daily. Notes to patient: 12/18/18 Evening  mupirocin cream 2 % Commonly known as: BACTROBAN Apply topically daily. Please apply to skin break down near J-tube Notes to patient: 12/19/18   oxyCODONE 5 MG/5ML solution Commonly known as: ROXICODONE Place 5 mLs (5 mg total) into feeding tube every 6 (six) hours as needed for up to 20 days for severe pain.            Durable Medical Equipment  (From admission, onward)         Start     Ordered   12/17/18 1102  For home use only DME Tube feeding  Once    Comments: Will need supplies for 24 hour tube feedings at home. J-tube to be flushed with sterile water 2x daily and dressing change daily.   12/17/18 1102         Follow-up Information    Ladell Pier, MD. Call in 1  day(s).   Specialty: Oncology Why: Your routine follow-up appointment is on 12/30/2018 at 10:00am. Please bring your medication list.  Contact information: [place] [address] [phone]        Binnie Rail, MD. Call in 1 day(s).   Specialty: Internal Medicine Contact information: [place] [address] [phone]        Burnell Blanks, MD .   Specialty: Cardiology Contact information: [place] [address] [place] [address] [phone]        [facility] Follow up.   Specialty: Home Health Services Why: Registered Nurse Contact information: [place] [address] [phone]        [facility] Follow up.   Why: Tube Feeds  You can reach [employer] at [phone]       [facility] Follow up.   Specialty: Cardiothoracic Surgery Why: Please come to the office on Thursday at 1:00pm for a dressing change Contact information: [place], [place] [phone]       [name], MD Follow up.   Specialty: Cardiothoracic Surgery Why: Your routine follow-up is on 12/31/2018 at 3:30pm. Please arrive at 3pm for a chest xray located at [facility] which is on the first floor of our building Contact information: [address] [place] [address] [phone]           Signed: [name] 12/18/2018, 10:58 AM

## 2018-12-05 NOTE — Progress Notes (Signed)
Patient ID: [name] [name], male   DOB: 06-10-1951, 68 y.o.   MRN: [id number] TCTS DAILY ICU PROGRESS NOTE                   [address]            [name] [address]          [phone]   3 Days Post-Op Procedure(s) (LRB): LEFT NECK EXPLORATION (Left) 10 days postop transhiatal total esophagectomy  Total Length of Stay:  LOS: 10 days   Subjective: Patient feels well, has been ambulating multiple times without difficulty  Objective: Vital signs in last 24 hours: Temp:  [98.4 F (36.9 C)-99.6 F (37.6 C)] 98.5 F (36.9 C) (06/18 0755) Pulse Rate:  [60-78] 71 (06/18 0700) Cardiac Rhythm: Normal sinus rhythm (06/18 0430) BP: (101-127)/(56-71) 109/66 (06/18 0700) SpO2:  [94 %-99 %] 97 % (06/18 0700) Weight:  [77.1 kg] 77.1 kg (06/18 0500)  Filed Weights   12/03/18 0400 12/04/18 0500 12/05/18 0500  Weight: 76.6 kg 77 kg 77.1 kg    Weight change: 0.1 kg   Hemodynamic parameters for last 24 hours:    Intake/Output from previous day: 06/17 0701 - 06/18 0700 In: 987.2 [I.V.:27.2; NG/GT:960] Out: -   Intake/Output this shift: No intake/output data recorded.  Current Meds: Scheduled Meds: . bethanechol  10 mg Oral QID  . Chlorhexidine Gluconate Cloth  6 each Topical Daily  . diphenhydrAMINE  25 mg Per Tube QHS  . enoxaparin (LOVENOX) injection  40 mg Subcutaneous Q24H  . feeding supplement (PRO-STAT SUGAR FREE 64)  30 mL Per Tube BID  . insulin aspart  0-24 Units Subcutaneous Q4H  . mouth rinse  15 mL Mouth Rinse BID  . pantoprazole (PROTONIX) IV  40 mg Intravenous Q12H  . sodium chloride flush  10-40 mL Intracatheter Q12H   Continuous Infusions: . dextrose 5 % and 0.45% NaCl Stopped (12/04/18 0743)  . feeding supplement (OSMOLITE 1.5 CAL) 1,000 mL (12/03/18 1832)  . potassium chloride Stopped (11/30/18 1319)   PRN Meds:.oxyCODONE **AND** acetaminophen, diphenhydrAMINE **OR** diphenhydrAMINE, morphine injection, naloxone **AND** sodium  chloride flush, ondansetron (ZOFRAN) IV, ondansetron (ZOFRAN) IV, potassium chloride, sodium chloride flush  General appearance: alert and cooperative Neurologic: intact Heart: regular rate and rhythm, S1, S2 normal, no murmur, click, rub or gallop Lungs: clear to auscultation bilaterally Abdomen: soft, non-tender; bowel sounds normal; no masses,  no organomegaly Extremities: extremities normal, atraumatic, no cyanosis or edema and Homans sign is negative, no sign of DVT Neck wound dressing changed at bedside - granulating , decreased drainage    Lab Results: CBC: Recent Labs    12/04/18 1240  WBC 9.7  HGB 10.7*  HCT 33.1*  PLT 393   BMET:  Recent Labs    12/04/18 0418  NA 137  K 4.0  CL 105  CO2 24  GLUCOSE 114*  BUN 15  CREATININE 0.63  CALCIUM 8.0*    CMET: Lab Results  Component Value Date   WBC 9.7 12/04/2018   HGB 10.7 (L) 12/04/2018   HCT 33.1 (L) 12/04/2018   PLT 393 12/04/2018   GLUCOSE 114 (H) 12/04/2018   CHOL 161 05/06/2018   TRIG 134.0 05/06/2018   HDL 54.20 05/06/2018   LDLDIRECT 131.7 12/27/2009   LDLCALC 80 05/06/2018   ALT 14 11/22/2018   AST 18 11/22/2018   NA 137 12/04/2018   K 4.0 12/04/2018   CL 105 12/04/2018   CREATININE 0.63 12/04/2018  BUN 15 12/04/2018   CO2 24 12/04/2018   TSH 1.24 05/06/2018   PSA 1.49 05/06/2018   INR 1.1 11/22/2018   HGBA1C 5.4 05/06/2018   MICROALBUR 1.1 02/25/2013      PT/INR: No results for input(s): LABPROT, INR in the last 72 hours. Radiology: No results found.   Assessment/Plan: S/P Procedure(s) (LRB): LEFT NECK EXPLORATION (Left) 10 days status post transhiatal throat total esophagectomy Continue with daily wet-to-dry dressing changes left neck To step down  [name] 12/05/2018 8:09 AM

## 2018-12-05 NOTE — Discharge Instructions (Signed)
Esophageal Cancer  Esophageal cancer is an abnormal growth of cancerous (malignant) cells in the part of the body that moves food from the mouth to the stomach (esophagus). What are the causes? The exact cause of esophageal cancer is not known. What increases the risk? Risk factors for esophageal cancer include:  Being older than 70 years of age.  Being male.  Using any tobacco products, including cigarettes, chewing tobacco, and e-cigarettes.  Excessive alcohol use.  Eating a diet that is low in fruits and vegetables.  Being overweight or obese.  Having a damaged esophagus due to exposure to poisons (toxins).  Having a history of other types of cancer.  Having conditions that cause damage or irritation to the esophagus. These conditions include: ? Acid reflux. ? Barrett's esophagus. ? Achalasia. ? Tylosis. ? Plummer-Vinson syndrome. ? HPV (human papillomavirus). What are the signs or symptoms? Symptoms may include:  Trouble swallowing.  Chest or back pain.  Weight loss without trying (unintentional weight loss).  Fatigue.  Hoarse voice.  Coughing. This may include coughing up blood.  Vomiting. This may include vomiting up blood.  Hiccups.  Stools (feces) that look black or tarry due to bleeding into the esophagus.  Bone pain. How is this diagnosed? This condition may be diagnosed based on:  Your symptoms and medical history.  A physical exam of the throat. A tube with a light and camera on the end of it (endoscope, bronchoscope, or laryngoscope) may be used to examine your throat and esophagus and to check if the cancer has spread to other areas, such as the lungs. Tissue samples may be removed (biopsy) and examined for cancer cells.  A procedure in which you swallow a solution (barium) and then X-rays are done to evaluate the esophagus (barium swallow). The barium shows up well on X-rays, making it easier for your health care provider to see possible  problems.  Imaging tests, such as: ? X-rays. ? CT scan. ? MRI. ? PET scan. Your cancer will be assessed (staged) to determine how severe it is and how much it has spread. How is this treated? Treatment for esophageal cancer depends on the type and stage of the cancer. Treatment may include one or more of the following:  Surgery to remove small tumors within the esophagus.  Surgery to remove part of the esophagus (esophagectomy).  Surgery to remove part of the esophagus and the upper portion of the stomach (esophagogastrectomy).  Medicines that kill cancer cells (chemotherapy).  High-energy rays that kill cancer cells (radiation therapy).  Targeted therapy. This targets specific parts of cancer cells and the area around them to block the growth and spread of the cancer. Targeted therapy can help limit damage to healthy cells.  Medicines that help your body's disease-fighting system (immune system) fight the cancer cells (immunotherapy).  A procedure to remove part of the inner lining of the esophagus (endoscopic mucosal resection).  A procedure in which you are given IV medicine and then an endoscope is used to shine a special type of light onto the esophagus (photodynamic therapy). The light changes the medicine into a chemical that destroys cancer cells.  A procedure in which a small balloon is inflated in the esophagus (radiofrequency ablation). The balloon uses an electric current to heat and destroy the cells in the lining of the esophagus. Follow these instructions at home: Lifestyle   Do not use any products that contain nicotine or tobacco, such as cigarettes and e-cigarettes. If you need help quitting,  ask your health care provider.  Do not drink alcohol. General instructions  Try to eat regular, healthy meals. Some of your treatments might affect your appetite and your ability to swallow. If you are having problems eating or if you do not have an appetite, meet with a  dietitian.  Take over-the-counter and prescription medicines only as told by your health care provider.  Consider joining a support group for people who have been diagnosed with esophageal cancer.  Work with your cancer care team to manage any side effects of treatment.  Keep all follow-up visits as told by your health care provider. This is important. Where to find more information  American Cancer Society: www.cancer.[place]): www.cancer.gov Contact a health care provider if:  You have more problems swallowing or eating.  You have new fatigue or weakness.  You continue to lose weight unintentionally.  You have a fever. Get help right away if:  You have pain that suddenly gets worse.  You have trouble breathing.  You vomit blood or black material that looks like coffee grounds.  You have black stools.  You faint. Summary  Esophageal cancer is an abnormal growth of cancerous (malignant) cells in the part of the body that moves food from the mouth to the stomach (esophagus).  During diagnosis, a tube with a light and camera on the end of it (endoscope, bronchoscope, or laryngoscope) may be used to examine your throat and esophagus.  Do not use any products that contain nicotine or tobacco, such as cigarettes and e-cigarettes. If you need help quitting, ask your health care provider.  Work with your cancer care team to manage any side effects of treatment. This information is not intended to replace advice given to you by your health care provider. Make sure you discuss any questions you have with your health care provider. Document Released: 05/18/2008 Document Revised: 12/06/2017 Document Reviewed: 03/07/2017 Elsevier Interactive Patient Education  2019 [place]     Jejunostomy Tube Home Guide, Adult A gastrostomy tube, or J-tube, is a tube that is inserted through the abdomen into the stomach. The tube is used to give feedings and  medicines when a person is unable to eat and drink enough on his or her own. How to care for a J-tube Supplies needed  Saline solution or clean, warm water and soap.  Cotton swab or gauze.  Precut gauze bandage (dressing) and tape, if needed. Instructions 1. Wash your hands with soap and water. 2. If there is a dressing between the person's skin and the tube, remove it. 3. Check the area where the tube enters the skin. Check for problems such as: ? Redness. ? Swelling. ? Pus-like drainage. ? Extra skin growth. 4. Moisten the cotton swab with the saline solution or soap and water mixture. Gently clean around the insertion site. Remove any drainage or crusting. ? When the G-tube is first put in, a normal saline solution or water can be used to clean the skin. ? Mild soap and warm water can be used when the skin around the G-tube site has healed. 5. If there should be a dressing between the person's skin and the tube, apply it at this time. How to flush a J-tube Flush the J-tube regularly to keep it from clogging. Flush it before and after feedings and as often as told by the health care provider. Supplies needed  Purified or sterile water, warmed. If the person has a weak disease-fighting (immune) system,  or if he or she has difficulty fighting off infections (is immunocompromised), use only sterile water. ? If you are unsure about the amount of chemical contaminants in purified or drinking water, use sterile water. ? To purify drinking water by boiling:  Boil water for at least 1 minute. Keep lid over water while it boils. Allow water to cool to room temperature before using.  60cc j-tube syringe. Instructions 1. Wash your hands with soap and water. 2. Draw up 30 mL of warm water in a syringe. 3. Connect the syringe to the tube. 4. Slowly and gently push the water into the tube. J-tube problems and solutions  If the tube comes out: ? Cover the opening with a clean dressing and  tape. ? Call a health care provider right away. ? A health care provider will need to put the tube back in within 4 hours.  If there is skin or scar tissue growing where the tube enters the skin: ? Keep the area clean and dry. ? Secure the tube with tape so that the tube does not move around too much. ? Call a health care provider.  If the tube gets clogged: ? Slowly push warm water into the tube with a large syringe. ? Do not force the fluid into the tube or push an object into the tube. ? If you are not able to unclog the tube, call a health care provider right away. Follow these instructions at home: Feedings  Give feedings at room temperature.  Cover and place unused feedings in the refrigerator.  If feedings are continuous: ? Do not put more than 4 hours worth of feedings in the feeding bag. ? Stop the feedings when you need to give medicine or flush the tube. Be sure to restart the feedings. ? Make sure the person's head is above his or her stomach (upright position). This will prevent choking and discomfort.  Replace feeding bags and syringes as told by the health care provider.  Make sure the person is in the right position during and after feedings: ? During feedings, the person's position should be in the upright position. ? After a noncontinuous feeding (bolus feeding), have the person stay in the upright position for 1 hour. General instructions  Only use syringes made for J-tubes.  Do not pull or put tension on the tube.  Clamp the tube before removing the cap or disconnecting a syringe.  Measure the length of the J-tube every day from the insertion site to the end of the tube.  If the person's J-tube has a balloon, check the fluid in the balloon every week. The amount of fluid that should be in the balloon can be found in the manufacturers specifications.  Make sure the person takes care of his or her oral health, such as by brushing his or her  teeth.  Remove excess air from the J-tube as told by the person's health care provider. This is called "venting."  Keep the area where the tube enters the skin clean and dry.  Do not push feedings, medicines, or flushes rapidly. Contact a health care provider if:  The person with the tube has any of these problems: ? Constipation. ? Fever.  There is a large amount of fluid or mucus-like liquid leaking from the tube.  Skin or scar tissue appears to be growing where the tube enters the skin.  The length of tube from the insertion site to the J-tube gets longer. Get help right away  if:  The person with the tube has any of these problems: ? Severe abdominal pain. ? Severe tenderness. ? Severe bloating. ? Nausea. ? Vomiting. ? Trouble breathing. ? Shortness of breath.  Any of these problems happen in the area where the tube enters the skin: ? Redness, irritation, swelling, or soreness. ? Pus-like discharge. ? A bad smell.  The tube is clogged and cannot be flushed.  The tube comes out. Summary A gastrostomy tube, or J-tube, is a tube that is inserted through the abdomen into the stomach. The tube is used to give feedings and medicines when a person is unable to eat and drink enough on his or her own.  Check and clean the insertion site daily as told by the person's health care provider.  Flush the J-tube regularly to keep it from clogging. Flush it before and after feedings and as often as told by the person's health care provider.  Keep the area where the tube enters the skin clean and dry. This information is not intended to replace advice given to you by your health care provider. Make sure you discuss any questions you have with your health care provider. Document Released: 08/14/2001 Document Revised: 12/19/2016 Document Reviewed: 07/31/2016 Elsevier Interactive Patient Education  2019 [place]  Dumping syndrome is a combination of  symptoms that occur when food passes through the stomach too quickly. This is most often caused by weight loss (bariatric) or stomach (gastric) surgery. There are two types of dumping syndrome, and some people have both types:  Early dumping syndrome causes symptoms that occur soon after eating (10-30 minutes).  Late dumping syndrome causes symptoms that occur a few hours after eating (2-3 hours). When you eat and drink, your stomach needs time to digest everything. With dumping syndrome, undigested food and stomach fluids move too quickly into the small intestine, causing symptoms of early dumping syndrome. In other cases, large amounts of sugar move into the small intestine, which causes the pancreas to release too much of the hormone insulin. As a result, your blood sugar can get too low (hypoglycemia), and you may get symptoms of late dumping syndrome. What are the causes?  Early dumping syndrome is caused by large amounts of undigested food and liquids passing into the upper part of your small intestine.  Late dumping syndrome is caused by large amounts of sugar from your diet moving into your small intestine. What increases the risk? Certain types of stomach surgery can lead to dumping syndrome. You may be at risk for dumping syndrome if you have had:  Surgery for ulcers or stomach cancer.  Weight loss surgery (bariatric surgery).  Surgery to treat severe heartburn (gastroesophageal reflux). What are the signs or symptoms? Early dumping syndrome Symptoms of early dumping syndrome may include:  Nausea.  Fullness or bloating.  Cramps.  Vomiting.  Feeling flushed.  Sweating.  Feeling dizzy.  A fast or irregular heartbeat (palpitations).  Diarrhea.  Headache. Late dumping syndrome Symptoms of late dumping syndrome may include:  Dizziness.  Weakness.  Sweating.  Palpitations.  Hunger.  Confusion. How is this diagnosed? This condition may be diagnosed based  on:  Your symptoms and medical history.  A physical exam.  Tests, such as: ? A modified glucose tolerance test. This blood test measures insulin secretion after you have a certain type of sugary (glucose) drink. ? Gastric emptying test. This test involves eating a bland meal that includes a material that shows up  easily on an X-ray. After eating the meal, you have X-rays to find out how long it takes the stomach to empty. ? Upper endoscopy. In this exam, a health care provider puts a flexible telescope (endoscope) through your mouth and throat to check the inside of your stomach. How is this treated? For most people, this condition can be managed with diet changes. You may need to change your diet, nutrition, and eating habits. Working with a dietitian can be helpful. Other treatments may include:  Medicine that slows down stomach emptying and reduces insulin secretion. This medicine may be given by injection.  Medicine that slows down the digestion of sugar. This medicine is usually used to treat type 2 diabetes. Sometimes it can also relieve symptoms of late dumping syndrome.  Surgery. For severe cases of dumping syndrome, reconstructive stomach surgery may be needed if other treatments are not working. Follow these instructions at home: Eating strategies  Avoid drinking liquids for 30 minutes before meals.  Avoid drinking liquids for at least 30 minutes after meals.  Lie down for 15-30 minutes after meals. This will help to prevent light-headedness and will slow down the emptying of your stomach.  Eat smaller portions of food. Cut food into small pieces and chew thoroughly before you swallow.  Work with a Microbiologist to change your diet and maintain good nutrition. What to eat and drink   Try to make sure that at least half of your daily servings of grains come from whole grains. These foods are high in fiber, which helps to slow down digestion.  Eat more complex carbohydrates,  such as whole grains, vegetables, and beans.  Avoid eating simple carbohydrates and foods that have added sugar, such as fruit juices, white bread, pastries, and sweets. Foods that are high in sugar are especially likely to bring about symptoms.  With each meal and snack, eat one food that is high in protein.  Avoid dairy products if they aggravate your symptoms. Try lactose-free dairy products instead.  Avoid drinking alcohol.  Take a fiber supplement if recommended by your health care provider.  Increase the thickness of foods by adding plant-based thickening agents, such as pectin.  Avoid eating very hot or very cold foods. They may aggravate your symptoms. General instructions  Take over-the-counter and prescription medicines only as told by your health care provider.  Keep all follow-up visits as told by your health care provider. This is important. Contact a health care provider if you:  Notice that your symptoms change or get worse.  Find that diet changes do not help your symptoms.  Have trouble maintaining a healthy weight. Get help right away if you:  Become dehydrated.  Have severe dizziness.  Notice that your heartbeat becomes very irregular. Summary  Dumping syndrome is a combination of symptoms that occur when food passes through the stomach too quickly.  Symptoms may include nausea, vomiting, diarrhea, dizziness, weakness, or hunger.  This condition is often treated with dietary changes and medicines.  Work with a Microbiologist to change your diet and maintain good nutrition. This information is not intended to replace advice given to you by your health care provider. Make sure you discuss any questions you have with your health care provider. Document Released: 04/28/2004 Document Revised: 04/10/2017 Document Reviewed: 04/10/2017 Elsevier Interactive Patient Education  2019 [employer].

## 2018-12-06 LAB — GLUCOSE, CAPILLARY
Glucose-Capillary: 125 mg/dL — ABNORMAL HIGH (ref 70–99)
Glucose-Capillary: 98 mg/dL (ref 70–99)
Glucose-Capillary: 126 mg/dL — ABNORMAL HIGH (ref 70–99)
Glucose-Capillary: 96 mg/dL (ref 70–99)
Glucose-Capillary: 104 mg/dL — ABNORMAL HIGH (ref 70–99)

## 2018-12-06 LAB — BASIC METABOLIC PANEL
Anion gap: 6 (ref 5–15)
BUN: 17 mg/dL (ref 8–23)
CO2: 25 mmol/L (ref 22–32)
Calcium: 8.1 mg/dL — ABNORMAL LOW (ref 8.9–10.3)
Chloride: 105 mmol/L (ref 98–111)
Creatinine, Ser: 0.7 mg/dL (ref 0.61–1.24)
GFR calc Af Amer: >60 mL/min (ref >60)
GFR calc non Af Amer: >60 mL/min (ref >60)
Glucose, Bld: 117 mg/dL — ABNORMAL HIGH (ref 70–99)
Potassium: 4.2 mmol/L (ref 3.5–5.1)
Sodium: 136 mmol/L (ref 135–145)

## 2018-12-06 LAB — CBC
HCT: 29.5 % — ABNORMAL LOW (ref 39.0–52.0)
Hemoglobin: 9.7 g/dL — ABNORMAL LOW (ref 13.0–17.0)
MCH: 30.3 pg (ref 26.0–34.0)
MCHC: 32.9 g/dL (ref 30.0–36.0)
MCV: 92.2 fL (ref 80.0–100.0)
Platelets: 432 10*3/uL — ABNORMAL HIGH (ref 150–400)
RBC: 3.2 MIL/uL — ABNORMAL LOW (ref 4.22–5.81)
RDW: 13 % (ref 11.5–15.5)
WBC: 7.7 10*3/uL (ref 4.0–10.5)
nRBC: 0 % (ref 0.0–0.2)

## 2018-12-06 MED ORDER — BETHANECHOL 1 MG/ML PEDIATRIC ORAL SUSPENSION
10.0000 mg | Freq: Four times a day (QID) | ORAL | Status: DC
  Administered 2018-12-06 – 2018-12-11 (×23): 10 mg via ORAL
  Filled 2018-12-06 (×26): qty 10

## 2018-12-06 NOTE — Progress Notes (Signed)
Left arm swollen, warm to touch + pulse.as endorsed swelling  comes from  Iv site infiltration. Denied any pain. Arm elevated with pillow, ice pack applied, swelling subsided. Continue to monitor.

## 2018-12-06 NOTE — Care Management Important Message (Signed)
Important Message  Patient Details  Name: [name] [name] MRN: [id number] Date of Birth: Apr 04, 1951   Medicare Important Message Given:  Yes     Memory Argue 12/06/2018, 4:12 PM

## 2018-12-06 NOTE — Progress Notes (Addendum)
      [address]       [place] [address]             [phone]      4 Days Post-Op Procedure(s) (LRB): LEFT NECK EXPLORATION (Left)  12 days s/p transhiatal esophagectomy   Subjective: Feels okay this morning. No issues overnight.   Objective: Vital signs in last 24 hours: Temp:  [98.4 F (36.9 C)-99.3 F (37.4 C)] 99 F (37.2 C) (06/19 0340) Pulse Rate:  [68-79] 79 (06/19 0340) Cardiac Rhythm: Normal sinus rhythm (06/19 0749) Resp:  [23-26] 26 (06/19 0340) BP: (95-125)/(59-73) 121/66 (06/19 0340) SpO2:  [94 %-100 %] 94 % (06/19 0340) Weight:  [76.2 kg] 76.2 kg (06/19 0206)     Intake/Output from previous day: 06/18 0701 - 06/19 0700 In: 1450 [I.V.:10; NG/GT:1260] Out: 0  Intake/Output this shift: No intake/output data recorded.  General appearance: alert, cooperative and no distress Heart: regular rate and rhythm, S1, S2 normal, no murmur, click, rub or gallop Lungs: clear to auscultation bilaterally Abdomen: soft, non-tender; bowel sounds normal; no masses,  no organomegaly Extremities: 1-2+ pitting pedal edema Wound: neck packed with wet-to-dry dressing. Abdominal incision c/d/i  Lab Results: Recent Labs    12/04/18 1240 12/06/18 0235  WBC 9.7 7.7  HGB 10.7* 9.7*  HCT 33.1* 29.5*  PLT 393 432*   BMET:  Recent Labs    12/04/18 0418 12/06/18 0235  NA 137 136  K 4.0 4.2  CL 105 105  CO2 24 25  GLUCOSE 114* 117*  BUN 15 17  CREATININE 0.63 0.70  CALCIUM 8.0* 8.1*    PT/INR: No results for input(s): LABPROT, INR in the last 72 hours. ABG    Component Value Date/Time   PHART 7.440 11/26/2018 0511   HCO3 22.7 11/26/2018 0511   TCO2 24 11/26/2018 0511   ACIDBASEDEF 1.0 11/26/2018 0511   O2SAT 97.0 11/26/2018 0511   CBG (last 3)  Recent Labs    12/05/18 1948 12/05/18 2353 12/06/18 0338  GLUCAP 110* 94 125*    Assessment/Plan: S/P Procedure(s) (LRB): LEFT NECK EXPLORATION (Left)  1. CV-NSR in the 70s-80s, BP  well controlled.  2. Pulm-tolerating room air with good oxygen saturation. Continue to use incentive spirometer. CXR stable. Still appears to have bilateral pneumothoraces.  3. Renal-creatinine 0.70, Potassium better this morning 4. H and H-9.7/29.5, expected acute blood loss anemia 5. Endo-blood gluocose is well controlled 6. GI-continue tube feedings now at goal, nutrition following. Only liquid through the j-tube. Failed swallow study and back to the OR on 6/15 for an anastomosis leak and neck exploration. Continue wet-to-dry dressings 7. Lovenox for DVT prophylaxis 8 Urecholine liquid through the tube for urinary retention.  Plan: small sips of water today per Dr. Servando Snare. Daily neck dressing changes. Continue to ambulate around the unit. Plan for swallow possibly next week.   LOS: 11 days    [name] [name] 12/06/2018  Dressing change left neck - improving  I have seen and examined [name] and agree with the above assessment  and plan.  [name] MD [facility] [phone] Office [phone] 12/06/2018 3:49 PM

## 2018-12-07 LAB — GLUCOSE, CAPILLARY
Glucose-Capillary: 110 mg/dL — ABNORMAL HIGH (ref 70–99)
Glucose-Capillary: 113 mg/dL — ABNORMAL HIGH (ref 70–99)
Glucose-Capillary: 115 mg/dL — ABNORMAL HIGH (ref 70–99)
Glucose-Capillary: 115 mg/dL — ABNORMAL HIGH (ref 70–99)
Glucose-Capillary: 118 mg/dL — ABNORMAL HIGH (ref 70–99)
Glucose-Capillary: 88 mg/dL (ref 70–99)

## 2018-12-07 NOTE — Plan of Care (Signed)
  Problem: Education: Goal: Knowledge of General Education information will improve Description: Including pain rating scale, medication(s)/side effects and non-pharmacologic comfort measures Outcome: Progressing   Problem: Health Behavior/Discharge Planning: Goal: Ability to manage health-related needs will improve Outcome: Progressing   Problem: Clinical Measurements: Goal: Ability to maintain clinical measurements within normal limits will improve Outcome: Progressing Goal: Will remain free from infection Outcome: Progressing Goal: Diagnostic test results will improve Outcome: Progressing Goal: Respiratory complications will improve Outcome: Progressing Goal: Cardiovascular complication will be avoided Outcome: Progressing   Problem: Nutrition: Goal: Adequate nutrition will be maintained Outcome: Progressing   Problem: Coping: Goal: Level of anxiety will decrease Outcome: Progressing   Problem: Pain Managment: Goal: General experience of comfort will improve Outcome: Progressing   Problem: Safety: Goal: Ability to remain free from injury will improve Outcome: Progressing   Problem: Skin Integrity: Goal: Risk for impaired skin integrity will decrease Outcome: Progressing   Problem: Education: Goal: Knowledge of the prescribed therapeutic regimen will improve Outcome: Progressing   Problem: Bowel/Gastric: Goal: Gastrointestinal status for postoperative course will improve Outcome: Progressing   Problem: Nutritional: Goal: Ability to achieve adequate nutritional intake will improve Outcome: Progressing   Problem: Clinical Measurements: Goal: Postoperative complications will be avoided or minimized Outcome: Progressing   Problem: Respiratory: Goal: Ability to maintain a clear airway will improve Outcome: Progressing   Problem: Skin Integrity: Goal: Demonstration of wound healing without infection will improve Outcome: Progressing

## 2018-12-07 NOTE — Progress Notes (Addendum)
      [address]       [place] [address]             [phone]      5 Days Post-Op Procedure(s) (LRB): LEFT NECK EXPLORATION (Left) Subjective: Feels pretty well, legs feeling a bit better with TED hose- less pain with ambulation  Objective: Vital signs in last 24 hours: Temp:  [97.6 F (36.4 C)-98.6 F (37 C)] 98.4 F (36.9 C) (06/20 1047) Pulse Rate:  [72-80] 75 (06/20 0400) Cardiac Rhythm: Normal sinus rhythm;Bundle branch block (06/20 0700) Resp:  [17-21] 21 (06/20 0400) BP: (107-140)/(60-86) 110/60 (06/20 1047) SpO2:  [96 %-99 %] 98 % (06/20 0819) Weight:  [77.3 kg] 77.3 kg (06/20 0500)  Hemodynamic parameters for last 24 hours:    Intake/Output from previous day: 06/19 0701 - 06/20 0700 In: 1480 [NG/GT:1260] Out: -  Intake/Output this shift: No intake/output data recorded.  General appearance: alert, cooperative and no distress Heart: regular rate and rhythm Lungs: dim right><left base Abdomen: benign Extremities: + BLE edema Wound: incis on abd healing well, I will changeneck dressing this am   Lab Results: Recent Labs    12/04/18 1240 12/06/18 0235  WBC 9.7 7.7  HGB 10.7* 9.7*  HCT 33.1* 29.5*  PLT 393 432*   BMET:  Recent Labs    12/06/18 0235  NA 136  K 4.2  CL 105  CO2 25  GLUCOSE 117*  BUN 17  CREATININE 0.70  CALCIUM 8.1*    PT/INR: No results for input(s): LABPROT, INR in the last 72 hours. ABG    Component Value Date/Time   PHART 7.440 11/26/2018 0511   HCO3 22.7 11/26/2018 0511   TCO2 24 11/26/2018 0511   ACIDBASEDEF 1.0 11/26/2018 0511   O2SAT 97.0 11/26/2018 0511   CBG (last 3)  Recent Labs    12/07/18 0017 12/07/18 0456 12/07/18 0821  GLUCAP 88 115* 115*    Meds Scheduled Meds: . bethanechol  10 mg Oral QID  . Chlorhexidine Gluconate Cloth  6 each Topical Daily  . diphenhydrAMINE  25 mg Per Tube QHS  . enoxaparin (LOVENOX) injection  40 mg Subcutaneous Q24H  . feeding supplement  (PRO-STAT SUGAR FREE 64)  30 mL Per Tube BID  . insulin aspart  0-24 Units Subcutaneous Q4H  . mouth rinse  15 mL Mouth Rinse BID  . pantoprazole (PROTONIX) IV  40 mg Intravenous Q12H  . sodium chloride flush  10-40 mL Intracatheter Q12H   Continuous Infusions: . dextrose 5 % and 0.45% NaCl Stopped (12/04/18 0743)  . feeding supplement (OSMOLITE 1.5 CAL) 1,000 mL (12/06/18 2335)  . potassium chloride Stopped (11/30/18 1319)   PRN Meds:.oxyCODONE **AND** acetaminophen, diphenhydrAMINE **OR** [DISCONTINUED] diphenhydrAMINE, morphine injection, ondansetron (ZOFRAN) IV, potassium chloride, sodium chloride flush  Xrays No results found.  Assessment/Plan: S/P Procedure(s) (LRB): LEFT NECK EXPLORATION (Left)  1 hemodyns stable in sinus rhythm, mildly htn at times 2 sats good on RA 3 BS well controlled 4 no new labs 5 urinating well 6 tolerating TF's(J-tube) 7 lovenox for DVT prophylaxis   Addendum, dressing changed, mod purulent drainage, excellent granulation tissue about 90%   LOS: 12 days    John Giovanni [facility] 12/07/2018 Pager [phone]  I have seen and examined the patient and agree with the assessment and plan as outlined.  Rexene Alberts, MD 12/07/2018 12:29 PM

## 2018-12-08 LAB — GLUCOSE, CAPILLARY
Glucose-Capillary: 129 mg/dL — ABNORMAL HIGH (ref 70–99)
Glucose-Capillary: 90 mg/dL (ref 70–99)
Glucose-Capillary: 124 mg/dL — ABNORMAL HIGH (ref 70–99)
Glucose-Capillary: 97 mg/dL (ref 70–99)
Glucose-Capillary: 85 mg/dL (ref 70–99)
Glucose-Capillary: 91 mg/dL (ref 70–99)

## 2018-12-08 NOTE — Progress Notes (Addendum)
      [address]       [name] [address]             [phone]      6 Days Post-Op Procedure(s) (LRB): LEFT NECK EXPLORATION (Left) Subjective: Feels fine, conts to ambulate well with less swelling in LE's, neck without significant pain  Objective: Vital signs in last 24 hours: Temp:  [97.6 F (36.4 C)-98.5 F (36.9 C)] 98.5 F (36.9 C) (06/21 0714) Pulse Rate:  [63-85] 70 (06/21 0300) Cardiac Rhythm: Sinus bradycardia;Bundle branch block (06/21 0700) Resp:  [17-29] 26 (06/21 0300) BP: (110-126)/(60-76) 117/62 (06/21 0714) SpO2:  [93 %-99 %] 93 % (06/21 0714)  Hemodynamic parameters for last 24 hours:    Intake/Output from previous day: 06/20 0701 - 06/21 0700 In: 1730 [NG/GT:1560] Out: -  Intake/Output this shift: No intake/output data recorded.  General appearance: alert, cooperative and no distress Heart: regular rate and rhythm Lungs: clear to auscultation bilaterally Abdomen: benign Extremities: improved edema Wound: neck dressing with purulent drainage , conts to have improving granulation tissue  Lab Results: Recent Labs    12/06/18 0235  WBC 7.7  HGB 9.7*  HCT 29.5*  PLT 432*   BMET:  Recent Labs    12/06/18 0235  NA 136  K 4.2  CL 105  CO2 25  GLUCOSE 117*  BUN 17  CREATININE 0.70  CALCIUM 8.1*    PT/INR: No results for input(s): LABPROT, INR in the last 72 hours. ABG    Component Value Date/Time   PHART 7.440 11/26/2018 0511   HCO3 22.7 11/26/2018 0511   TCO2 24 11/26/2018 0511   ACIDBASEDEF 1.0 11/26/2018 0511   O2SAT 97.0 11/26/2018 0511   CBG (last 3)  Recent Labs    12/08/18 0103 12/08/18 0419 12/08/18 0751  GLUCAP 129* 90 124*    Meds Scheduled Meds: . bethanechol  10 mg Oral QID  . Chlorhexidine Gluconate Cloth  6 each Topical Daily  . diphenhydrAMINE  25 mg Per Tube QHS  . enoxaparin (LOVENOX) injection  40 mg Subcutaneous Q24H  . feeding supplement (PRO-STAT SUGAR FREE 64)  30 mL Per Tube  BID  . insulin aspart  0-24 Units Subcutaneous Q4H  . mouth rinse  15 mL Mouth Rinse BID  . pantoprazole (PROTONIX) IV  40 mg Intravenous Q12H  . sodium chloride flush  10-40 mL Intracatheter Q12H   Continuous Infusions: . dextrose 5 % and 0.45% NaCl Stopped (12/04/18 0743)  . feeding supplement (OSMOLITE 1.5 CAL) 1,000 mL (12/08/18 1024)  . potassium chloride Stopped (11/30/18 1319)   PRN Meds:.oxyCODONE **AND** acetaminophen, diphenhydrAMINE **OR** [DISCONTINUED] diphenhydrAMINE, morphine injection, ondansetron (ZOFRAN) IV, potassium chloride, sodium chloride flush  Xrays No results found.  Assessment/Plan: S/P Procedure(s) (LRB): LEFT NECK EXPLORATION (Left)  1 very stable , hemodyn stable in sinus rhythm/sinus brady 2 cont daily neck wound dressing changes- dressing changed this am by me, conts to improve 3 sats good on RA 4 BS controlled ok, cont TF's sips of H2O 5 lovenox for DVT prophylaxis 5 cont routine pulm toilet ambulation which he does very well with  LOS: 13 days    John Giovanni [facility] 12/08/2018  Pager [phone]  I have seen and examined the patient and agree with the assessment and plan as outlined.  Rexene Alberts, MD 12/08/2018 11:09 AM

## 2018-12-08 NOTE — Plan of Care (Signed)
  Problem: Education: Goal: Knowledge of General Education information will improve Description: Including pain rating scale, medication(s)/side effects and non-pharmacologic comfort measures Outcome: Progressing   Problem: Health Behavior/Discharge Planning: Goal: Ability to manage health-related needs will improve Outcome: Progressing   Problem: Clinical Measurements: Goal: Ability to maintain clinical measurements within normal limits will improve Outcome: Progressing Goal: Will remain free from infection Outcome: Progressing Goal: Diagnostic test results will improve Outcome: Progressing Goal: Respiratory complications will improve Outcome: Progressing Goal: Cardiovascular complication will be avoided Outcome: Progressing   Problem: Nutrition: Goal: Adequate nutrition will be maintained Outcome: Progressing   Problem: Coping: Goal: Level of anxiety will decrease Outcome: Progressing   Problem: Pain Managment: Goal: General experience of comfort will improve Outcome: Progressing   Problem: Safety: Goal: Ability to remain free from injury will improve Outcome: Progressing   Problem: Skin Integrity: Goal: Risk for impaired skin integrity will decrease Outcome: Progressing   Problem: Education: Goal: Knowledge of the prescribed therapeutic regimen will improve Outcome: Progressing   Problem: Bowel/Gastric: Goal: Gastrointestinal status for postoperative course will improve Outcome: Progressing   Problem: Nutritional: Goal: Ability to achieve adequate nutritional intake will improve Outcome: Progressing   Problem: Clinical Measurements: Goal: Postoperative complications will be avoided or minimized Outcome: Progressing   Problem: Respiratory: Goal: Ability to maintain a clear airway will improve Outcome: Progressing   Problem: Skin Integrity: Goal: Demonstration of wound healing without infection will improve Outcome: Progressing

## 2018-12-09 LAB — GLUCOSE, CAPILLARY
Glucose-Capillary: 110 mg/dL — ABNORMAL HIGH (ref 70–99)
Glucose-Capillary: 124 mg/dL — ABNORMAL HIGH (ref 70–99)
Glucose-Capillary: 145 mg/dL — ABNORMAL HIGH (ref 70–99)
Glucose-Capillary: 93 mg/dL (ref 70–99)
Glucose-Capillary: 95 mg/dL (ref 70–99)
Glucose-Capillary: 99 mg/dL (ref 70–99)

## 2018-12-09 NOTE — Care Management Important Message (Signed)
Important Message  Patient Details  Name: [name] [name] MRN: [id number] Date of Birth: 1951-06-10   Medicare Important Message Given:  Yes     Memory Argue 12/09/2018, 3:12 PM

## 2018-12-09 NOTE — Progress Notes (Signed)
Nutrition Follow-up  DOCUMENTATION CODES:   Non-severe (moderate) malnutrition in context of chronic illness  INTERVENTION:   Continue tube feeding via J-tube: - Osmolite 1.5 @ 60 ml/hr (1440 ml/day) - Pro-stat 30 ml BID  Tube feeding regimen provides 2360 kcal, 120 grams of protein, and 1097 ml of H2O (100% of needs).  - RD will monitor results of swallow study tentatively later this week and supplement as appropriate  NUTRITION DIAGNOSIS:   Moderate Malnutrition related to chronic illness, cancer and cancer related treatments as evidenced by mild muscle depletion, energy intake < 75% for > or equal to 1 month.  Ongoing, being addressed via TF  GOAL:   Patient will meet greater than or equal to 90% of their needs  Met via TF  MONITOR:   Diet advancement, Skin, TF tolerance, Weight trends, Labs, I & O's  REASON FOR ASSESSMENT:   Consult Assessment of nutrition requirement/status, Enteral/tube feeding initiation and management  ASSESSMENT:   Patient with PMH significant for diverticulitis, HLD, HTN, and adenocarcinoma of GE junction (s/p chemotherapy/radiation). Presents this admission for surgical resection of esophageal cancer.   6/8 - s/p total esophagectomy with cervical esophagogastrostomy, J-tube placement  6/15 - exploration of left neck and drainage   Per TCTS note, no plan for swallow yet but possibly later this week.  Overall, weight up 6 lbs since admission. Reviewed RN edema assessment. Pt with moderate pitting edema to LUE and BLE.  Current TF: Osmolite 1.5 @ 60 ml/hr, Pro-stat 30 ml BID  Medications reviewed and include: SSI, Protonix  Labs reviewed. CBG's: 85-145 x 24 hours  Diet Order:   Diet Order    None      EDUCATION NEEDS:   Education needs have been addressed  Skin:  Skin Assessment: Skin Integrity Issues: Incisions: left neck, L/R chest, abdomen   Last BM:  12/07/18  Height:   Ht Readings from Last 1 Encounters:  11/25/18  _0  (1.803 m)    Weight:   Wt Readings from Last 1 Encounters:  12/09/18 75.8 kg    Ideal Body Weight:  78.2 kg  BMI:  Body mass index is 23.31 kg/m.  Estimated Nutritional Needs:   Kcal:  2200-2400 kcal  Protein:  110-130 grams  Fluid:  >/= 2L/day     Gaynell Face, MS, RD, LDN Inpatient Clinical Dietitian Pager: [phone] Weekend/After Hours: [phone]

## 2018-12-09 NOTE — Progress Notes (Addendum)
      [address]       [place] [address]             [phone]      7 Days Post-Op Procedure(s) (LRB): LEFT NECK EXPLORATION (Left) Subjective: Feels okay this morning. Still having issues sleeping at night.   Objective: Vital signs in last 24 hours: Temp:  [98.1 F (36.7 C)-98.6 F (37 C)] 98.1 F (36.7 C) (06/22 0735) Pulse Rate:  [70-74] 74 (06/21 1937) Cardiac Rhythm: Normal sinus rhythm (06/21 2000) Resp:  [17-37] 37 (06/22 0735) BP: (100-119)/(62-83) 100/83 (06/22 0735) SpO2:  [97 %-100 %] 100 % (06/22 0403) Weight:  [75.8 kg] 75.8 kg (06/22 0403)     Intake/Output from previous day: 06/21 0701 - 06/22 0700 In: 1440 [NG/GT:1440] Out: -  Intake/Output this shift: No intake/output data recorded.  General appearance: alert, cooperative and no distress Heart: regular rate and rhythm, S1, S2 normal, no murmur, click, rub or gallop Lungs: clear to auscultation bilaterally Abdomen: soft, non-tender; bowel sounds normal; no masses,  no organomegaly Extremities: 1+ pitting edema in lower extremity with ted hose in place Wound: abdominal wound well healed. Left neck incision with wet-to-dry dressing  Lab Results: No results for input(s): WBC, HGB, HCT, PLT in the last 72 hours. BMET: No results for input(s): NA, K, CL, CO2, GLUCOSE, BUN, CREATININE, CALCIUM in the last 72 hours.  PT/INR: No results for input(s): LABPROT, INR in the last 72 hours. ABG    Component Value Date/Time   PHART 7.440 11/26/2018 0511   HCO3 22.7 11/26/2018 0511   TCO2 24 11/26/2018 0511   ACIDBASEDEF 1.0 11/26/2018 0511   O2SAT 97.0 11/26/2018 0511   CBG (last 3)  Recent Labs    12/09/18 0001 12/09/18 0358 12/09/18 0515  GLUCAP 110* 145* 99    Assessment/Plan: S/P Procedure(s) (LRB): LEFT NECK EXPLORATION (Left)  1. Hemodynamically stable and in NSR. BP has been well controlled.  2. He has been tolerating room air with no issues. He has been ambulating  around the unit with little assistance and has had no shortness of breath.  3. Renal-no recent labs- kidney function has been stable 4. H and H 9.7/29.5-no recent labs 5. Blood glucose levels have been well controlled 6. Left neck incision-pack with wet-to-dry dressing daily. No swallow planned yet-possibly for this week. Is tolerating small sips.  Plan: Feels okay this morning. Making good progress. Continues to use incentive spirometer. Continue to encouraged ambulation. Encouraged to dose with pain medicine before bed to help with sleep and pain control.    LOS: 14 days    [name] 12/09/2018  Dressing left neck changed, continues to improve Patient taking p.o. clear sips without difficulty, will advance as tolerated with clear liquids. Follow-up swallow later this week I have seen and examined [name] and agree with the above assessment  and plan.  [name] MD Beeper [phone] Office [phone] 12/09/2018 2:20 PM

## 2018-12-10 LAB — GLUCOSE, CAPILLARY
Glucose-Capillary: 112 mg/dL — ABNORMAL HIGH (ref 70–99)
Glucose-Capillary: 112 mg/dL — ABNORMAL HIGH (ref 70–99)
Glucose-Capillary: 119 mg/dL — ABNORMAL HIGH (ref 70–99)
Glucose-Capillary: 127 mg/dL — ABNORMAL HIGH (ref 70–99)
Glucose-Capillary: 128 mg/dL — ABNORMAL HIGH (ref 70–99)
Glucose-Capillary: 91 mg/dL (ref 70–99)

## 2018-12-10 NOTE — Progress Notes (Addendum)
      [address]       ,[place] [address]             [phone]      8 Days Post-Op Procedure(s) (LRB): LEFT NECK EXPLORATION (Left)  15 post op-   Transhiatal esophageal resection    Subjective:  No new complaints.  He states he had some coffee this morning which he could feel leaking out when he was drinking.  + ambulation without difficulty.  Objective: Vital signs in last 24 hours: Temp:  [97.9 F (36.6 C)-98.8 F (37.1 C)] 97.9 F (36.6 C) (06/23 0725) Pulse Rate:  [71] 71 (06/23 0402) Cardiac Rhythm: Normal sinus rhythm (06/22 2100) Resp:  [19-26] 23 (06/23 0725) BP: (109-116)/(61-65) 114/65 (06/23 0725) Weight:  [75.9 kg] 75.9 kg (06/23 0402)  Intake/Output from previous day: 06/22 0701 - 06/23 0700 In: 1540 [NG/GT:1440] Out: -   General appearance: alert, cooperative and no distress Heart: regular rate and rhythm Lungs: clear to auscultation bilaterally Abdomen: soft, non-tender; bowel sounds normal; no masses,  no organomegaly Extremities: extremities normal, atraumatic, no cyanosis or edema Wound: abdominal incision well healed, neck wound open with mild drainage with oral intake  Lab Results: No results for input(s): WBC, HGB, HCT, PLT in the last 72 hours. BMET: No results for input(s): NA, K, CL, CO2, GLUCOSE, BUN, CREATININE, CALCIUM in the last 72 hours.  PT/INR: No results for input(s): LABPROT, INR in the last 72 hours. ABG    Component Value Date/Time   PHART 7.440 11/26/2018 0511   HCO3 22.7 11/26/2018 0511   TCO2 24 11/26/2018 0511   ACIDBASEDEF 1.0 11/26/2018 0511   O2SAT 97.0 11/26/2018 0511   CBG (last 3)  Recent Labs    12/09/18 2112 12/10/18 0027 12/10/18 0400  GLUCAP 93 112* 128*    Assessment/Plan: S/P Procedure(s) (LRB): LEFT NECK EXPLORATION (Left)  1. CV- hemodynamically stable 2. Pulm- no acute issues, continue IS 3. Left Neck Wound- open, packing changed today at 830....drainage with oral intake  of coffee this morning, continue small sips of liquids, for swallow study later this week 4. Dispo- patient stable, continue dressing changes as needed for saturations, for swallow study later this week   LOS: 15 days   Stable today  I have seen and examined [name] [name] and agree with the above assessment  and plan.  [name] MD Beeper [phone] Office [phone] 12/10/2018 3:26 PM   [name] [name] 12/10/2018

## 2018-12-10 NOTE — Plan of Care (Signed)
  Problem: Education: Goal: Knowledge of General Education information will improve Description: Including pain rating scale, medication(s)/side effects and non-pharmacologic comfort measures Outcome: Progressing   Problem: Health Behavior/Discharge Planning: Goal: Ability to manage health-related needs will improve Outcome: Progressing   Problem: Clinical Measurements: Goal: Ability to maintain clinical measurements within normal limits will improve Outcome: Progressing Goal: Will remain free from infection Outcome: Progressing Goal: Diagnostic test results will improve Outcome: Progressing Goal: Respiratory complications will improve Outcome: Progressing Goal: Cardiovascular complication will be avoided Outcome: Progressing   Problem: Nutrition: Goal: Adequate nutrition will be maintained Outcome: Progressing   Problem: Coping: Goal: Level of anxiety will decrease Outcome: Progressing   Problem: Pain Managment: Goal: General experience of comfort will improve Outcome: Progressing   Problem: Safety: Goal: Ability to remain free from injury will improve Outcome: Progressing   Problem: Skin Integrity: Goal: Risk for impaired skin integrity will decrease Outcome: Progressing   Problem: Education: Goal: Knowledge of the prescribed therapeutic regimen will improve Outcome: Progressing   Problem: Bowel/Gastric: Goal: Gastrointestinal status for postoperative course will improve Outcome: Progressing   Problem: Nutritional: Goal: Ability to achieve adequate nutritional intake will improve Outcome: Progressing   Problem: Clinical Measurements: Goal: Postoperative complications will be avoided or minimized Outcome: Progressing   Problem: Respiratory: Goal: Ability to maintain a clear airway will improve Outcome: Progressing   Problem: Skin Integrity: Goal: Demonstration of wound healing without infection will improve Outcome: Progressing

## 2018-12-11 ENCOUNTER — Inpatient Hospital Stay (HOSPITAL_COMMUNITY): Payer: Medicare Other

## 2018-12-11 DIAGNOSIS — R609 Edema, unspecified: Secondary | ICD-10-CM

## 2018-12-11 LAB — GLUCOSE, CAPILLARY
Glucose-Capillary: 106 mg/dL — ABNORMAL HIGH (ref 70–99)
Glucose-Capillary: 106 mg/dL — ABNORMAL HIGH (ref 70–99)
Glucose-Capillary: 119 mg/dL — ABNORMAL HIGH (ref 70–99)
Glucose-Capillary: 121 mg/dL — ABNORMAL HIGH (ref 70–99)
Glucose-Capillary: 125 mg/dL — ABNORMAL HIGH (ref 70–99)
Glucose-Capillary: 139 mg/dL — ABNORMAL HIGH (ref 70–99)

## 2018-12-11 MED ORDER — DIPHENHYDRAMINE HCL 12.5 MG/5ML PO ELIX
50.0000 mg | ORAL_SOLUTION | Freq: Every evening | ORAL | Status: DC | PRN
  Administered 2018-12-11 – 2018-12-12 (×2): 50 mg
  Filled 2018-12-11 (×3): qty 20

## 2018-12-11 MED ORDER — ZOLPIDEM TARTRATE 5 MG PO TABS: 5.0000 mg | ORAL_TABLET | Freq: Every evening | ORAL | Status: DC | PRN

## 2018-12-11 NOTE — Progress Notes (Signed)
Left upper extremity venous duplex completed Preliminary results in Chart review CV Proc. Jadene Pierini PA-C paged no answer. Report given to the R.N. [place] 12/11/2018, 12:10 PM

## 2018-12-11 NOTE — Progress Notes (Addendum)
      [address]       [place] [address]             [phone]      Patient was moved to the bed. Premedicated with 2mg  Morphine IV  Left neck incision dressing changed this afternoon. Old 2 inch gauze packing removed and fresh 2 inch sterile gauze soaked with sterile saline packed with sterile gloves. There was gurgling noted from the anastomosis site. Covered the incision with sterile 4 x 4 gauze and three pieces of tape. There was some drainage present on the previous dry 4 x 4 gauze.   Patient tolerated well. No complications.    Nicholes Rough, PA-C I have seen and examined [name] and agree with the above assessment  and plan.  [name] MD Beeper [phone] Office [phone] 12/11/2018 3:20 PM

## 2018-12-11 NOTE — Progress Notes (Addendum)
      [address]       [place] [address]             [phone]      9 Days Post-Op Procedure(s) (LRB): LEFT NECK EXPLORATION (Left) Subjective: C/o not sleeping- benedryl not helping Left arm swelling- non painful  Objective: Vital signs in last 24 hours: Temp:  [98 F (36.7 C)-98.7 F (37.1 C)] 98.2 F (36.8 C) (06/24 0356) Pulse Rate:  [68] 68 (06/23 1953) Cardiac Rhythm: Normal sinus rhythm (06/23 1920) Resp:  [19-28] 19 (06/24 0356) BP: (112-122)/(51-69) 115/69 (06/24 0356) SpO2:  [98 %] 98 % (06/23 1953) Weight:  [74.9 kg] 74.9 kg (06/24 0331)  Hemodynamic parameters for last 24 hours:    Intake/Output from previous day: 06/23 0701 - 06/24 0700 In: 1560 [NG/GT:1320] Out: -  Intake/Output this shift: No intake/output data recorded.  General appearance: alert, cooperative and no distress Heart: regular rate and rhythm Lungs: dim in bases Abdomen: benign Extremities: + LE edema, improving, left arm swelling Wound: incis stable- mod drainage from neck incision, fluid and purulence  Lab Results: No results for input(s): WBC, HGB, HCT, PLT in the last 72 hours. BMET: No results for input(s): NA, K, CL, CO2, GLUCOSE, BUN, CREATININE, CALCIUM in the last 72 hours.  PT/INR: No results for input(s): LABPROT, INR in the last 72 hours. ABG    Component Value Date/Time   PHART 7.440 11/26/2018 0511   HCO3 22.7 11/26/2018 0511   TCO2 24 11/26/2018 0511   ACIDBASEDEF 1.0 11/26/2018 0511   O2SAT 97.0 11/26/2018 0511   CBG (last 3)  Recent Labs    12/10/18 1951 12/10/18 2352 12/11/18 0355  GLUCAP 112* 119* 119*    Meds Scheduled Meds: . bethanechol  10 mg Oral QID  . diphenhydrAMINE  25 mg Per Tube QHS  . enoxaparin (LOVENOX) injection  40 mg Subcutaneous Q24H  . feeding supplement (PRO-STAT SUGAR FREE 64)  30 mL Per Tube BID  . insulin aspart  0-24 Units Subcutaneous Q4H  . mouth rinse  15 mL Mouth Rinse BID  . pantoprazole  (PROTONIX) IV  40 mg Intravenous Q12H  . sodium chloride flush  10-40 mL Intracatheter Q12H   Continuous Infusions: . dextrose 5 % and 0.45% NaCl Stopped (12/04/18 0743)  . feeding supplement (OSMOLITE 1.5 CAL) 1,000 mL (12/10/18 1742)  . potassium chloride Stopped (11/30/18 1319)   PRN Meds:.oxyCODONE **AND** acetaminophen, diphenhydrAMINE **OR** [DISCONTINUED] diphenhydrAMINE, morphine injection, ondansetron (ZOFRAN) IV, potassium chloride, sodium chloride flush  Xrays No results found.  Assessment/Plan: S/P Procedure(s) (LRB): LEFT NECK EXPLORATION (Left)  1 hemodyn stable in sinus rhythm 2 sats good on RA 3 arm swelling(L), will check venous duplex 4 will try ambien for sleep 5 still needs dressing change- later today 6 sips of liquids being tolerated but concern for leak continues- swallow study soon  LOS: 16 days    John Giovanni PA-C 12/11/2018 Pager [phone]  I have seen and examined [name] and agree with the above assessment  and plan.  [name] MD Beeper [phone] Office [phone] 12/11/2018 3:20 PM

## 2018-12-11 NOTE — Plan of Care (Signed)
  Problem: Education: Goal: Knowledge of General Education information will improve Description: Including pain rating scale, medication(s)/side effects and non-pharmacologic comfort measures Outcome: Progressing   Problem: Health Behavior/Discharge Planning: Goal: Ability to manage health-related needs will improve Outcome: Progressing   Problem: Clinical Measurements: Goal: Ability to maintain clinical measurements within normal limits will improve Outcome: Progressing Goal: Will remain free from infection Outcome: Progressing Goal: Diagnostic test results will improve Outcome: Progressing Goal: Respiratory complications will improve Outcome: Progressing Goal: Cardiovascular complication will be avoided Outcome: Progressing   Problem: Nutrition: Goal: Adequate nutrition will be maintained Outcome: Progressing   Problem: Coping: Goal: Level of anxiety will decrease Outcome: Progressing   Problem: Pain Managment: Goal: General experience of comfort will improve Outcome: Progressing   Problem: Safety: Goal: Ability to remain free from injury will improve Outcome: Progressing   Problem: Skin Integrity: Goal: Risk for impaired skin integrity will decrease Outcome: Progressing   Problem: Education: Goal: Knowledge of the prescribed therapeutic regimen will improve Outcome: Progressing   Problem: Bowel/Gastric: Goal: Gastrointestinal status for postoperative course will improve Outcome: Progressing   Problem: Nutritional: Goal: Ability to achieve adequate nutritional intake will improve Outcome: Progressing   Problem: Clinical Measurements: Goal: Postoperative complications will be avoided or minimized Outcome: Progressing   Problem: Respiratory: Goal: Ability to maintain a clear airway will improve Outcome: Progressing   Problem: Skin Integrity: Goal: Demonstration of wound healing without infection will improve Outcome: Progressing

## 2018-12-12 LAB — GLUCOSE, CAPILLARY
Glucose-Capillary: 102 mg/dL — ABNORMAL HIGH (ref 70–99)
Glucose-Capillary: 104 mg/dL — ABNORMAL HIGH (ref 70–99)
Glucose-Capillary: 105 mg/dL — ABNORMAL HIGH (ref 70–99)
Glucose-Capillary: 114 mg/dL — ABNORMAL HIGH (ref 70–99)
Glucose-Capillary: 96 mg/dL (ref 70–99)
Glucose-Capillary: 98 mg/dL (ref 70–99)

## 2018-12-12 MED ORDER — BETHANECHOL CHLORIDE 10 MG PO TABS
10.0000 mg | ORAL_TABLET | Freq: Four times a day (QID) | ORAL | Status: DC
  Administered 2018-12-12 – 2018-12-18 (×22): 10 mg via ORAL
  Filled 2018-12-12 (×29): qty 1

## 2018-12-12 NOTE — Progress Notes (Addendum)
      [address]       [name]             [phone]      10 Days Post-Op Procedure(s) (LRB): LEFT NECK EXPLORATION (Left) Subjective: Feels okay this morning. Misses his wife.   Objective: Vital signs in last 24 hours: Temp:  [97.7 F (36.5 C)-99.4 F (37.4 C)] 98.1 F (36.7 C) (06/25 0718) Pulse Rate:  [60-69] 69 (06/25 0311) Cardiac Rhythm: Normal sinus rhythm (06/25 0400) Resp:  [14-19] 19 (06/25 0311) BP: (108-128)/(57-81) 122/81 (06/25 0311) SpO2:  [98 %-99 %] 99 % (06/25 0311) Weight:  [74.9 kg] 74.9 kg (06/25 0311)    Intake/Output from previous day: 06/24 0701 - 06/25 0700 In: 1228 [P.O.:118; NG/GT:990] Out: -  Intake/Output this shift: No intake/output data recorded.  General appearance: alert, cooperative and no distress Heart: regular rate and rhythm, S1, S2 normal, no murmur, click, rub or gallop Lungs: clear to auscultation bilaterally Abdomen: soft, non-tender; bowel sounds normal; no masses,  no organomegaly Extremities: 1+ non pitting pedal edema, TED hose in place Wound: neck wound packed and dressed. Abdominal wound c.d.i  Lab Results: No results for input(s): WBC, HGB, HCT, PLT in the last 72 hours. BMET: No results for input(s): NA, K, CL, CO2, GLUCOSE, BUN, CREATININE, CALCIUM in the last 72 hours.  PT/INR: No results for input(s): LABPROT, INR in the last 72 hours. ABG    Component Value Date/Time   PHART 7.440 11/26/2018 0511   HCO3 22.7 11/26/2018 0511   TCO2 24 11/26/2018 0511   ACIDBASEDEF 1.0 11/26/2018 0511   O2SAT 97.0 11/26/2018 0511   CBG (last 3)  Recent Labs    12/11/18 2348 12/12/18 0312 12/12/18 0754  GLUCAP 106* 98 102*    Assessment/Plan: S/P Procedure(s) (LRB): LEFT NECK EXPLORATION (Left)  1. CV- hemodynamically stable and in NSR 2. Pulm- continue IS. No issues. On room air with good oxygen saturation. 3. Left neck wound-open and packing changed this morning. Dr. Servando Snare was  present for dressing change. He is cleared for small sips of clear liquid. Patient instructed on swallowing slowly and deliberately holding pressure over his incision.  4. Low back pain- Kpad helping and not needing as much pain medication 5. Left lower arm IV infiltrate. US showed a superficial left cephalic vein thrombosis. Keep arm elevated and keep warm compresses in place. Swelling improved this morning.   Plan: Continue daily dressing changes. Continue ambulating in the halls and using incentive spirometer.    LOS: 17 days    [name] [name] 12/12/2018  Wound contacting, no leakage from neck incision now I have seen and examined [name] [name] and agree with the above assessment  and plan.  [name] Isaac MD Beeper [phone] Office [phone] 12/12/2018 5:50 PM

## 2018-12-13 LAB — GLUCOSE, CAPILLARY
Glucose-Capillary: 104 mg/dL — ABNORMAL HIGH (ref 70–99)
Glucose-Capillary: 95 mg/dL (ref 70–99)
Glucose-Capillary: 87 mg/dL (ref 70–99)
Glucose-Capillary: 87 mg/dL (ref 70–99)
Glucose-Capillary: 86 mg/dL (ref 70–99)
Glucose-Capillary: 96 mg/dL (ref 70–99)

## 2018-12-13 MED ORDER — BOOST / RESOURCE BREEZE PO LIQD CUSTOM
1.0000 | ORAL | Status: DC
  Administered 2018-12-13 – 2018-12-17 (×5): 1 via ORAL

## 2018-12-13 MED ORDER — MUPIROCIN CALCIUM 2 % EX CREA
TOPICAL_CREAM | Freq: Every day | CUTANEOUS | Status: DC
  Administered 2018-12-13 – 2018-12-18 (×6): via TOPICAL
  Filled 2018-12-13: qty 15

## 2018-12-13 NOTE — Progress Notes (Signed)
Nutrition Follow-up  DOCUMENTATION CODES:   Non-severe (moderate) malnutrition in context of chronic illness  INTERVENTION:   Continue tube feeding via J-tube: - Osmolite 1.5 @ 60 ml/hr (1440 ml/day) - Pro-stat 30 ml BID  Tube feeding regimen provides 2360 kcal, 120 grams of protein, and 1097 ml of H2O (100% of needs).  - Boost Breeze po once daily, each supplement provides 250 kcal and 9 grams of protein  - RD will monitor results of swallow study and adjust oral nutriiton supplement and TF regimen as appropriate  NUTRITION DIAGNOSIS:   Moderate Malnutrition related to chronic illness, cancer and cancer related treatments as evidenced by mild muscle depletion, energy intake < 75% for > or equal to 1 month.  Ongoing, being addressed via TF  GOAL:   Patient will meet greater than or equal to 90% of their needs  Met via TF  MONITOR:   Diet advancement, Skin, TF tolerance, Weight trends, Labs, I & O's  REASON FOR ASSESSMENT:   Consult Assessment of nutrition requirement/status, Enteral/tube feeding initiation and management  ASSESSMENT:   Patient with PMH significant for diverticulitis, HLD, HTN, and adenocarcinoma of GE junction (s/p chemotherapy/radiation). Presents this admission for surgical resection of esophageal cancer.   6/8 - s/p total esophagectomy with cervical esophagogastrostomy, J-tube placement  6/15 - exploration of left neck and drainage  Pt has been cleared for small sips of clear liquids. Per RN note this AM, J-tube site noted with moderate purulent drainage.  Weight stable since admit, overall up 2 lbs. Reviewed RN edema assessment. Pt with moderate pitting edema to BLE.  Unable to reach pt via phone call to room.  RD will order Boost Breeze po once daily so that pt can sip on the supplement throughout the day.  Current TF: Osmolite 1.5 @ 60 ml/hr, Pro-stat 30 ml BID  Meal Completion: 70% of clear liquid dinner meal on 6/15  Medications  reviewed and include: SSI, Protonix  Labs reviewed. CBG's: 95-114 x 24 hours  Diet Order:   Diet Order            Diet clear liquid Room service appropriate? Yes; Fluid consistency: Thin  Diet effective now              EDUCATION NEEDS:   Education needs have been addressed  Skin:  Skin Assessment: Skin Integrity Issues: Incisions: left neck, L/R chest, abdomen   Last BM:  12/12/18  Height:   Ht Readings from Last 1 Encounters:  11/25/18 '5\' 11"'  (1.803 m)    Weight:   Wt Readings from Last 1 Encounters:  12/13/18 74.1 kg    Ideal Body Weight:  78.2 kg  BMI:  Body mass index is 22.78 kg/m.  Estimated Nutritional Needs:   Kcal:  2200-2400 kcal  Protein:  110-130 grams  Fluid:  >/= 2L/day     Gaynell Face, MS, RD, LDN Inpatient Clinical Dietitian Pager: [phone] Weekend/After Hours: [phone]

## 2018-12-13 NOTE — Progress Notes (Addendum)
      [address]       [place] [address]             [phone]      11 Days Post-Op Procedure(s) (LRB): LEFT NECK EXPLORATION (Left) Subjective: No issues other than lack of sleep.   Objective: Vital signs in last 24 hours: Temp:  [97.8 F (36.6 C)-98.9 F (37.2 C)] 97.8 F (36.6 C) (06/26 0344) Pulse Rate:  [59-60] 59 (06/26 0344) Cardiac Rhythm: Normal sinus rhythm (06/26 0736) Resp:  [14-19] 19 (06/26 0344) BP: (111-120)/(61-71) 120/68 (06/26 0344) SpO2:  [98 %-100 %] 100 % (06/26 0736) Weight:  [74.1 kg] 74.1 kg (06/26 0322)     Intake/Output from previous day: 06/25 0701 - 06/26 0700 In: 1370 [P.O.:200; NG/GT:900] Out: -  Intake/Output this shift: No intake/output data recorded.  General appearance: alert, cooperative and no distress Heart: regular rate and rhythm, S1, S2 normal, no murmur, click, rub or gallop Lungs: clear to auscultation bilaterally Abdomen: soft, non-tender; bowel sounds normal; no masses,  no organomegaly Extremities: extremities normal, atraumatic, no cyanosis or edema Wound: neck wound dressed with sterile 2 inch gauze. Healing well.   Lab Results: No results for input(s): WBC, HGB, HCT, PLT in the last 72 hours. BMET: No results for input(s): NA, K, CL, CO2, GLUCOSE, BUN, CREATININE, CALCIUM in the last 72 hours.  PT/INR: No results for input(s): LABPROT, INR in the last 72 hours. ABG    Component Value Date/Time   PHART 7.440 11/26/2018 0511   HCO3 22.7 11/26/2018 0511   TCO2 24 11/26/2018 0511   ACIDBASEDEF 1.0 11/26/2018 0511   O2SAT 97.0 11/26/2018 0511   CBG (last 3)  Recent Labs    12/12/18 2016 12/12/18 2341 12/13/18 0325  GLUCAP 105* 96 104*    Assessment/Plan: S/P Procedure(s) (LRB): LEFT NECK EXPLORATION (Left)  1. CV- hemodynamically stable and in NSR 2. Pulm- continue IS. No issues. On room air with good oxygen saturation. 3. Left neck wound-open and packing changed this morning.  4.  Low back pain- Kpad helping and not needing as much pain medication 5. Left lower arm IV infiltrate. US showed a superficial left cephalic vein thrombosis. Keep arm elevated and keep warm compresses in place. Swelling improved this morning.   Plan: Packing and dressing changed this morning with sterile gloves. Patient tolerated well and wound appears to be healing well. Still not sleeping at night.    LOS: 18 days    [name] 12/13/2018  Neck wound closing  Will plan repeat swallow Monday and try to get home soon with tube feeding  I have seen and examined [name] and agree with the above assessment  and plan.  [name] MD Beeper [phone] Office [phone] 12/13/2018 11:40 AM

## 2018-12-13 NOTE — Progress Notes (Signed)
J- tube site noted with purulent discharged in mod amount.  surrounding site is reddened. Cleansed with saline. PA made aware with order.  Dressing changed, continue to monitor.

## 2018-12-13 NOTE — Care Management Important Message (Signed)
Important Message  Patient Details  Name: [name] [name] MRN: [id number] Date of Birth: 04-Apr-1951   Medicare Important Message Given:  Yes     Memory Argue 12/13/2018, 3:20 PM

## 2018-12-14 LAB — GLUCOSE, CAPILLARY
Glucose-Capillary: 100 mg/dL — ABNORMAL HIGH (ref 70–99)
Glucose-Capillary: 101 mg/dL — ABNORMAL HIGH (ref 70–99)
Glucose-Capillary: 104 mg/dL — ABNORMAL HIGH (ref 70–99)
Glucose-Capillary: 127 mg/dL — ABNORMAL HIGH (ref 70–99)
Glucose-Capillary: 146 mg/dL — ABNORMAL HIGH (ref 70–99)

## 2018-12-14 NOTE — Progress Notes (Addendum)
12 Days Post-Op Procedure(s) (LRB): LEFT NECK EXPLORATION (Left) Subjective: Still having trouble with insomnia but otherwise says he feels well and had no new concerns.   Objective: Vital signs in last 24 hours: Temp:  [97.7 F (36.5 C)-98.4 F (36.9 C)] 97.8 F (36.6 C) (06/27 1050) Pulse Rate:  [60-70] 60 (06/26 2313) Cardiac Rhythm: Normal sinus rhythm (06/27 0800) Resp:  [19-23] 23 (06/27 1050) BP: (113-130)/(62-81) 120/71 (06/27 1050) SpO2:  [95 %-100 %] 98 % (06/27 0400) Weight:  [72.8 kg] 72.8 kg (06/27 0400)     Intake/Output from previous day: 06/26 0701 - 06/27 0700 In: 9179 [P.O.:300; NG/GT:1200] Out: -  Intake/Output this shift: Total I/O In: 620 [P.O.:240; Other:80; NG/GT:300] Out: -   General appearance: alert, cooperative and no distress Heart: regular rate and rhythm Lungs: Breath sounds clear Abdomen: soft, non-tender, TF infusing at 58ml/hr through J-tube exiting LUQ. Extremities: no edema Wound: neck wound dressing removed. Site is clean and has early granulation on all surfaces. No exudate. Site re-dressed with saline-moistened 2" gauze.   Lab Results: No results for input(s): WBC, HGB, HCT, PLT in the last 72 hours. BMET: No results for input(s): NA, K, CL, CO2, GLUCOSE, BUN, CREATININE, CALCIUM in the last 72 hours.  PT/INR: No results for input(s): LABPROT, INR in the last 72 hours. ABG    Component Value Date/Time   PHART 7.440 11/26/2018 0511   HCO3 22.7 11/26/2018 0511   TCO2 24 11/26/2018 0511   ACIDBASEDEF 1.0 11/26/2018 0511   O2SAT 97.0 11/26/2018 0511   CBG (last 3)  Recent Labs    12/13/18 2320 12/14/18 0424 12/14/18 1130  GLUCAP 96 127* 100*    Assessment/Plan: S/P Procedure(s) (LRB): LEFT NECK EXPLORATION (Left)  POD19 Transhiatal total esophagectomy with cervical esophagogastrostomy, pyloroplasty, feeding jejunostomy for adenocarcinoma of distal esophagus with subsequent left neck exploration for anastomotic leak on  6/15.   Wound is healing with daily dressing changes.Continue nutritional support. Plan to re-image the esophogeal anastomosis on Monday.   -Pre-diabetes-Glucose control adequate with SSI.  HTN- controlled.  -Continue Lovenox DVT PPX.   LOS: 19 days    [name] [name]. PA-C [phone] 12/14/2018  Doing well with nutrition, continue current care patient examined and medical record reviewed,agree with above note. Tharon Aquas Trigt III 12/14/2018

## 2018-12-15 LAB — GLUCOSE, CAPILLARY
Glucose-Capillary: 100 mg/dL — ABNORMAL HIGH (ref 70–99)
Glucose-Capillary: 109 mg/dL — ABNORMAL HIGH (ref 70–99)
Glucose-Capillary: 111 mg/dL — ABNORMAL HIGH (ref 70–99)
Glucose-Capillary: 121 mg/dL — ABNORMAL HIGH (ref 70–99)
Glucose-Capillary: 135 mg/dL — ABNORMAL HIGH (ref 70–99)
Glucose-Capillary: 98 mg/dL (ref 70–99)

## 2018-12-15 NOTE — Progress Notes (Addendum)
13 Days Post-Op Procedure(s) (LRB): LEFT NECK EXPLORATION (Left) Subjective: Awake and alert, Says he is comfortable and has no new problems.   Objective: Vital signs in last 24 hours: Temp:  [97.8 F (36.6 C)-98.9 F (37.2 C)] 98.5 F (36.9 C) (06/28 0826) Pulse Rate:  [57-81] 81 (06/28 0400) Cardiac Rhythm: Normal sinus rhythm (06/28 0702) Resp:  [21-25] 23 (06/28 0826) BP: (111-127)/(59-78) 111/61 (06/28 0826) SpO2:  [97 %-100 %] 100 % (06/28 0400) Weight:  [73.8 kg] 73.8 kg (06/28 0400)     Intake/Output from previous day: 06/27 0701 - 06/28 0700 In: 2100 [P.O.:480; NG/GT:1440] Out: -  Intake/Output this shift: No intake/output data recorded.  General appearance:alert, cooperative and no distress Heart:regular rate and rhythm Lungs:breath sounds clear Abdomen:soft, non-tender, TF infusing at 40ml/hr through J-tube exiting LUQ. Extremities:no edema Wound:neck wound dressing removed. Site is clean and has granulation on all surfaces. No exudate. Site re-dressed with saline-moistened 2" gauze.   Lab Results: No results for input(s): WBC, HGB, HCT, PLT in the last 72 hours. BMET: No results for input(s): NA, K, CL, CO2, GLUCOSE, BUN, CREATININE, CALCIUM in the last 72 hours.  PT/INR: No results for input(s): LABPROT, INR in the last 72 hours. ABG    Component Value Date/Time   PHART 7.440 11/26/2018 0511   HCO3 22.7 11/26/2018 0511   TCO2 24 11/26/2018 0511   ACIDBASEDEF 1.0 11/26/2018 0511   O2SAT 97.0 11/26/2018 0511   CBG (last 3)  Recent Labs    12/14/18 2357 12/15/18 0404 12/15/18 0827  GLUCAP 104* 111* 135*    Assessment/Plan: S/P Procedure(s) (LRB): LEFT NECK EXPLORATION (Left)  POD20 Transhiatal total esophagectomy with cervical esophagogastrostomy, pyloroplasty, feeding jejunostomy for adenocarcinoma of distal esophagus with subsequent left neck exploration for anastomotic leak on 6/15.   Open wound left neck measures about 2cm x 3cm x 2  1/2cm deep and is healing with daily dressing changes.Continue nutritional support. Plan to re-image the esophogeal anastomosis tomorrow.    -Pre-diabetes-Glucose control adequate with SSI.  HTN- controlled.  -Continue Lovenox DVT PPX.   LOS: 20 days    Antony Odea, PA-C [phone] 12/15/2018   Patient in good spirits enjoying full liquid diet Drainage from neck incision is decreasing patient examined and medical record reviewed,agree with above note. Tharon Aquas Trigt III 12/15/2018

## 2018-12-15 NOTE — Plan of Care (Signed)
  Problem: Education: Goal: Knowledge of General Education information will improve Description: Including pain rating scale, medication(s)/side effects and non-pharmacologic comfort measures Outcome: Progressing   Problem: Health Behavior/Discharge Planning: Goal: Ability to manage health-related needs will improve Outcome: Progressing   Problem: Clinical Measurements: Goal: Ability to maintain clinical measurements within normal limits will improve Outcome: Progressing Goal: Will remain free from infection Outcome: Progressing Goal: Diagnostic test results will improve Outcome: Progressing Goal: Respiratory complications will improve Outcome: Progressing Goal: Cardiovascular complication will be avoided Outcome: Progressing   Problem: Nutrition: Goal: Adequate nutrition will be maintained Outcome: Progressing   Problem: Coping: Goal: Level of anxiety will decrease Outcome: Progressing   Problem: Pain Managment: Goal: General experience of comfort will improve Outcome: Progressing   Problem: Safety: Goal: Ability to remain free from injury will improve Outcome: Progressing   Problem: Skin Integrity: Goal: Risk for impaired skin integrity will decrease Outcome: Progressing   Problem: Education: Goal: Knowledge of the prescribed therapeutic regimen will improve Outcome: Progressing   Problem: Bowel/Gastric: Goal: Gastrointestinal status for postoperative course will improve Outcome: Progressing   Problem: Nutritional: Goal: Ability to achieve adequate nutritional intake will improve Outcome: Progressing   Problem: Clinical Measurements: Goal: Postoperative complications will be avoided or minimized Outcome: Progressing   Problem: Respiratory: Goal: Ability to maintain a clear airway will improve Outcome: Progressing   Problem: Skin Integrity: Goal: Demonstration of wound healing without infection will improve Outcome: Progressing

## 2018-12-16 ENCOUNTER — Inpatient Hospital Stay (HOSPITAL_COMMUNITY): Payer: Medicare Other

## 2018-12-16 LAB — CBC
HCT: 31 % — ABNORMAL LOW (ref 39.0–52.0)
Hemoglobin: 10.1 g/dL — ABNORMAL LOW (ref 13.0–17.0)
MCH: 29.8 pg (ref 26.0–34.0)
MCHC: 32.6 g/dL (ref 30.0–36.0)
MCV: 91.4 fL (ref 80.0–100.0)
Platelets: 468 10*3/uL — ABNORMAL HIGH (ref 150–400)
RBC: 3.39 MIL/uL — ABNORMAL LOW (ref 4.22–5.81)
RDW: 13.2 % (ref 11.5–15.5)
WBC: 6.5 10*3/uL (ref 4.0–10.5)
nRBC: 0 % (ref 0.0–0.2)

## 2018-12-16 LAB — GLUCOSE, CAPILLARY
Glucose-Capillary: 129 mg/dL — ABNORMAL HIGH (ref 70–99)
Glucose-Capillary: 79 mg/dL (ref 70–99)
Glucose-Capillary: 107 mg/dL — ABNORMAL HIGH (ref 70–99)
Glucose-Capillary: 86 mg/dL (ref 70–99)
Glucose-Capillary: 95 mg/dL (ref 70–99)

## 2018-12-16 LAB — BASIC METABOLIC PANEL
Anion gap: 8 (ref 5–15)
BUN: 17 mg/dL (ref 8–23)
CO2: 26 mmol/L (ref 22–32)
Calcium: 8.9 mg/dL (ref 8.9–10.3)
Chloride: 103 mmol/L (ref 98–111)
Creatinine, Ser: 0.69 mg/dL (ref 0.61–1.24)
GFR calc Af Amer: >60 mL/min (ref >60)
GFR calc non Af Amer: >60 mL/min (ref >60)
Glucose, Bld: 124 mg/dL — ABNORMAL HIGH (ref 70–99)
Potassium: 4.2 mmol/L (ref 3.5–5.1)
Sodium: 137 mmol/L (ref 135–145)

## 2018-12-16 MED ORDER — IOHEXOL 300 MG/ML  SOLN
75.0000 mL | Freq: Once | INTRAMUSCULAR | Status: AC | PRN
  Administered 2018-12-16: 09:00:00 75 mL via ORAL

## 2018-12-16 NOTE — Progress Notes (Addendum)
      [address]       [place] [address]             [phone]      14 Days Post-Op Procedure(s) (LRB): LEFT NECK EXPLORATION (Left) Subjective: Still not sleeping well and cannot wait to get home.   Objective: Vital signs in last 24 hours: Temp:  [97.5 F (36.4 C)-98.2 F (36.8 C)] 97.5 F (36.4 C) (06/29 0350) Pulse Rate:  [58-64] 58 (06/29 0350) Cardiac Rhythm: Normal sinus rhythm (06/28 2000) Resp:  [16-32] 16 (06/29 0350) BP: (107-132)/(57-64) 107/61 (06/29 0350) SpO2:  [97 %-100 %] 100 % (06/29 0350) Weight:  [74.7 kg] 74.7 kg (06/29 0350)     Intake/Output from previous day: 06/28 0701 - 06/29 0700 In: 4431 [P.O.:220; I.V.:10; NG/GT:1200] Out: -  Intake/Output this shift: No intake/output data recorded.  General appearance: alert, cooperative and no distress Extremities: extremities normal, atraumatic, no cyanosis or edema Wound: neck incision is healing well. Packed wet-to-dry this morning.   Lab Results: Recent Labs    12/16/18 0654  WBC 6.5  HGB 10.1*  HCT 31.0*  PLT 468*   BMET:  Recent Labs    12/16/18 0654  NA 137  K 4.2  CL 103  CO2 26  GLUCOSE 124*  BUN 17  CREATININE 0.69  CALCIUM 8.9    PT/INR: No results for input(s): LABPROT, INR in the last 72 hours. ABG    Component Value Date/Time   PHART 7.440 11/26/2018 0511   HCO3 22.7 11/26/2018 0511   TCO2 24 11/26/2018 0511   ACIDBASEDEF 1.0 11/26/2018 0511   O2SAT 97.0 11/26/2018 0511   CBG (last 3)  Recent Labs    12/15/18 2345 12/16/18 0353 12/16/18 0800  GLUCAP 100* 129* 79    Assessment/Plan: S/P Procedure(s) (LRB): LEFT NECK EXPLORATION (Left)  POD 20 Transhiatal total esophagectomy with cervical esophagogastrostomy, pyloroplasty, feeding jejunostomy for adenocarcinoma of distal esophagus with subsequent left neck exploration for anastomotic leak on 6/15.   Repeat swallow on 6/29 showed a small continued leak. Report still pending. Continue  tube feedings. Nutrition following.   Daily neck wound dressing changes wet-to-dry. Performed this morning.   Pre-diabetes- continue insulin coverage with SSI  Left lower arm IV infiltrate-resolved edema after elevation and heat therapy   Plan: Will discuss possible discharge this week with Dr. Servando Snare. Back on tube feedings. Incision is healing well. [place] nursing orders placed last week and case management assisting.    LOS: 21 days    [name] [name] 12/16/2018  Swallow reviewed, much improved , still small controlled leak Plan d/c home with home nurse dressing changes and tube feeding   I have seen and examined [name] [name] and agree with the above assessment  and plan.  [name] Isaac MD Beeper [phone] Office [phone] 12/16/2018 5:28 PM

## 2018-12-17 LAB — GLUCOSE, CAPILLARY
Glucose-Capillary: 105 mg/dL — ABNORMAL HIGH (ref 70–99)
Glucose-Capillary: 109 mg/dL — ABNORMAL HIGH (ref 70–99)
Glucose-Capillary: 110 mg/dL — ABNORMAL HIGH (ref 70–99)
Glucose-Capillary: 124 mg/dL — ABNORMAL HIGH (ref 70–99)
Glucose-Capillary: 161 mg/dL — ABNORMAL HIGH (ref 70–99)
Glucose-Capillary: 99 mg/dL (ref 70–99)

## 2018-12-17 NOTE — Progress Notes (Signed)
Nutrition Follow-up  DOCUMENTATION CODES:   Non-severe (moderate) malnutrition in context of chronic illness  INTERVENTION:   Continue tube feeding via J-tube: - Osmolite 1.5 @ 60 ml/hr (1440 ml/day) - Pro-stat 30 ml BID  Tube feeding regimen provides2360kcal, 120grams of protein, and 1056m of H2O (100% of needs).   If nocturnal tube feeding is desired, recommend: - Osmolite 1.5 @ 100 ml/hr via J-tube to run over 12 hours from 2000 to 0800 (total of 1200 ml daily) - Pro-stat 30 ml TID  Nocturnal tube feeding regimen would provide 2100 kcal, 120 grams of protein, and 914 ml of H2O (95% of kcal needs, 100% of protein needs)  - Continue Boost Breeze po once daily, each supplement provides 250 kcal and 9 grams of protein  NUTRITION DIAGNOSIS:   Moderate Malnutrition related to chronic illness, cancer and cancer related treatments as evidenced by mild muscle depletion, energy intake < 75% for > or equal to 1 month.  Ongoing, being addressed via TF and oral nutrition supplements  GOAL:   Patient will meet greater than or equal to 90% of their needs  Met via TF  MONITOR:   Diet advancement, Skin, TF tolerance, Weight trends, Labs, I & O's  REASON FOR ASSESSMENT:   Consult Assessment of nutrition requirement/status, Enteral/tube feeding initiation and management  ASSESSMENT:   Patient with PMH significant for diverticulitis, HLD, HTN, and adenocarcinoma of GE junction (s/p chemotherapy/radiation). Presents this admission for surgical resection of esophageal cancer.   6/8 -s/ptotal esophagectomy with cervical esophagogastrostomy, J-tube placement 6/15 - exploration of left neck and drainage  Diet advanced to full liquids on 6/26.  Spoke with pt at bedside. Pt in good spirits, looking forward to discharge. Pt states he is having no issues with his TF. Pt reports he is still experiencing a small leak from his surgical incision but that he puts pressure on it and  tilts head forward when eating/drinking to help with this. Pt states he is tolerating a full liquid diet well. Noted ~75% complete full liquid meal tray at bedside. Pt reports his main issue is feeling full easily.  Pt reports he likes the [employer] Pt would like to continue with Boost Breeze rather than switching to Ensure Enlive as he does not like very sweet things.  Weight stable.  Noted plan for d/c tomorrow.  Current TF: Osmolite 1.5 @ 60 ml/hr, Pro-stat 30 ml BID  Meal Completion: 25-100% x last 8 meals (full liquid diet)  Medications reviewed and include: Boost Breeze daily, SSI, Protonix  Labs reviewed. CBG's: 95-124 x 24 hours  Diet Order:   Diet Order            Diet full liquid Room service appropriate? Yes; Fluid consistency: Thin  Diet effective now              EDUCATION NEEDS:   Education needs have been addressed  Skin:  Skin Assessment:  Skin Integrity Issues: Incisions: left neck, L/R chest, abdomen   Last BM:  12/16/18  Height:   Ht Readings from Last 1 Encounters:  11/25/18 _0  (1.803 m)    Weight:   Wt Readings from Last 1 Encounters:  12/17/18 73.8 kg    Ideal Body Weight:  78.2 kg  BMI:  Body mass index is 22.69 kg/m.  Estimated Nutritional Needs:   Kcal:  2200-2400 kcal  Protein:  110-130 grams  Fluid:  >/= 2L/day     KGaynell Face MS, RD, LDN Inpatient Clinical  Dietitian Pager: [phone] Weekend/After Hours: [phone]

## 2018-12-17 NOTE — Progress Notes (Signed)
PA made aware to order  DME needed for tomorrow's discharge.

## 2018-12-17 NOTE — TOC Transition Note (Signed)
Transition of Care [facility]) - CM/SW Discharge Note   Patient Details  Name: [name] [name] MRN: [id number] Date of Birth: 1951/01/05  [facility] CM/SW Contact:  Maryclare Labrador, RN Phone Number: 12/17/2018, 11:10 AM   Clinical Narrative:   Pt will discharge home in care of wife.  Pt has PCP and denied barriers with paying for medications as prescribed.  Pt will discharge home on tube feeds and will need dressing changes at discharge.  Attending group aware that [facility] will not provide more than 2 visits a week - CVTS to arrange for pt to come to office on weekdays that are not filled with [facility] visit, wife to provide dressing changes on weekend (wife will come in prior to discharge and receive teaching from bedside nurse on dressing changes and tube feed protocol.)  CM verbally gave pt medicare.gov list - pt chose [facility] for [facility] and [facility] for tube feeds and equipment - both agencies accepted pt.  Alvis Lemmings RN will also provide teaching on care needed in the home during [facility] visits.   Alvis Lemmings will begin [facility] RN services on 7/1 - attending group aware    Final next level of care: [place]     Patient Goals and CMS Choice Patient states their goals for this hospitalization and ongoing recovery are:: Pt states his goal is to be able to eat normal and not need tube feeds CMS Medicare.gov Compare Post Acute Care list provided to:: Patient Choice offered to / list presented to : Patient  Discharge Placement                       Discharge Plan and Services     Post Acute Care Choice: Durable Medical Equipment, Home Health          DME Arranged: Tube feeding, Tube feeding pump DME Agency: [facility] Date DME Agency Contacted: 12/17/18 Time DME Agency Contacted: 1109 Representative spoke with at DME Agency: [place]: RN [place] Agency: [place] Date [place]: 12/16/18 Time [place]: 78 Representative spoke with at  [place]: [place]) Interventions     Readmission Risk Interventions No flowsheet data found.

## 2018-12-17 NOTE — Progress Notes (Signed)
Instructed pt to let wife to come first thing  In the  morning  for dressing change and tube feeding teaching.

## 2018-12-17 NOTE — Progress Notes (Signed)
      [address]       [place] [address]             [phone]       Spoke with both nursing and case management about a discharge plan. Home health is able to come out to the patient's house tomorrow for teaching, dressing change, and to help hook up tube feedings. We will see the patient in the office on Thursday for a neck wound dressing change and then home health nursing will be back Friday. Plan discussed with nursing and case management.   Plan to wait until tomorrow for discharge so the patient can obtain the appropriate supplies to flush the j-tube and the proper tubing for the tube feeds.    Nicholes Rough, PA-C

## 2018-12-17 NOTE — Progress Notes (Addendum)
      [address]       [place] [address]             [phone]      15 Days Post-Op Procedure(s) (LRB): LEFT NECK EXPLORATION (Left) Subjective: Feels excited about going home soon  Objective: Vital signs in last 24 hours: Temp:  [97.8 F (36.6 C)-98.9 F (37.2 C)] 98.9 F (37.2 C) (06/30 0802) Pulse Rate:  [65-99] 65 (06/30 0802) Cardiac Rhythm: Normal sinus rhythm (06/30 0740) Resp:  [13-26] 13 (06/30 0326) BP: (101-117)/(51-66) 117/64 (06/30 0802) SpO2:  [92 %-99 %] 99 % (06/30 0802) Weight:  [73.8 kg] 73.8 kg (06/30 0545)     Intake/Output from previous day: 06/29 0701 - 06/30 0700 In: 1480 [P.O.:450; I.V.:20; NG/GT:660] Out: -  Intake/Output this shift: Total I/O In: 60 [NG/GT:60] Out: -   General appearance: alert, cooperative and no distress Heart: regular rate and rhythm, S1, S2 normal, no murmur, click, rub or gallop Lungs: clear to auscultation bilaterally Abdomen: soft, non-tender; bowel sounds normal; no masses,  no organomegaly Extremities: 1+ lower leg edema. Ted hose in place Wound: neck incision is clean and dressed wet-to-dry  Lab Results: Recent Labs    12/16/18 0654  WBC 6.5  HGB 10.1*  HCT 31.0*  PLT 468*   BMET:  Recent Labs    12/16/18 0654  NA 137  K 4.2  CL 103  CO2 26  GLUCOSE 124*  BUN 17  CREATININE 0.69  CALCIUM 8.9    PT/INR: No results for input(s): LABPROT, INR in the last 72 hours. ABG    Component Value Date/Time   PHART 7.440 11/26/2018 0511   HCO3 22.7 11/26/2018 0511   TCO2 24 11/26/2018 0511   ACIDBASEDEF 1.0 11/26/2018 0511   O2SAT 97.0 11/26/2018 0511   CBG (last 3)  Recent Labs    12/17/18 0008 12/17/18 0325 12/17/18 0805  GLUCAP 124* 110* 105*    Assessment/Plan: S/P Procedure(s) (LRB): LEFT NECK EXPLORATION (Left)  POD 21 Transhiatal total esophagectomy with cervical esophagogastrostomy, pyloroplasty, feeding jejunostomyfor adenocarcinoma of distal esophagus with  subsequent left neck exploration for anastomotic leak on 6/15.   Repeat swallow on 6/29 showed a small continued leak. The left eccentric proximal anastomotic leak is still present. This was initially less striking than on the prior exam, but became more prominent when challenged by increase thoracic pressure such as spontaneous cough. Continue tube feedings. Nutrition following.   Daily neck wound dressing changes wet-to-dry. Performed this morning.   Pre-diabetes- continue insulin coverage with SSI  Left lower arm IV infiltrate-resolved edema after elevation and heat therapy   Plan: Discharge later today or tomorrow morning. Spoke with case management who is arranging home health needs.      LOS: 22 days    [name] 12/17/2018

## 2018-12-18 ENCOUNTER — Other Ambulatory Visit: Payer: Self-pay

## 2018-12-18 ENCOUNTER — Telehealth: Payer: Self-pay | Admitting: Internal Medicine

## 2018-12-18 LAB — GLUCOSE, CAPILLARY
Glucose-Capillary: 106 mg/dL — ABNORMAL HIGH (ref 70–99)
Glucose-Capillary: 131 mg/dL — ABNORMAL HIGH (ref 70–99)
Glucose-Capillary: 90 mg/dL (ref 70–99)
Glucose-Capillary: 91 mg/dL (ref 70–99)

## 2018-12-18 MED ORDER — PRO-STAT SUGAR FREE PO LIQD: 30.0000 mL | Freq: Two times a day (BID) | ORAL | 0 refills | Status: DC

## 2018-12-18 MED ORDER — ACETAMINOPHEN 160 MG/5ML PO SOLN: 325.0000 mg | ORAL | 0 refills | Status: DC | PRN

## 2018-12-18 MED ORDER — BETHANECHOL CHLORIDE 10 MG PO TABS: 10.0000 mg | ORAL_TABLET | Freq: Four times a day (QID) | ORAL | 1 refills | Status: DC

## 2018-12-18 MED ORDER — OXYCODONE HCL 5 MG/5ML PO SOLN: 5.0000 mg | Freq: Four times a day (QID) | ORAL | 0 refills | Status: AC | PRN

## 2018-12-18 MED ORDER — OSMOLITE 1.5 CAL PO LIQD: 1000.0000 mL | ORAL | 0 refills | Status: AC

## 2018-12-18 MED ORDER — MUPIROCIN CALCIUM 2 % EX CREA: TOPICAL_CREAM | Freq: Every day | CUTANEOUS | 0 refills | Status: DC

## 2018-12-18 NOTE — Telephone Encounter (Signed)
[place] [phone] need verbal orders to have a nurse go out to the patient for a wound on his neck and a tube feeding in his stomach. 2w2, 1w2

## 2018-12-18 NOTE — Plan of Care (Signed)
  Problem: Education: Goal: Knowledge of General Education information will improve Description: Including pain rating scale, medication(s)/side effects and non-pharmacologic comfort measures Outcome: Progressing   Problem: Health Behavior/Discharge Planning: Goal: Ability to manage health-related needs will improve Outcome: Progressing   Problem: Nutrition: Goal: Adequate nutrition will be maintained Outcome: Progressing   Problem: Pain Managment: Goal: General experience of comfort will improve Outcome: Progressing   Problem: Nutritional: Goal: Ability to achieve adequate nutritional intake will improve Outcome: Progressing   Problem: Clinical Measurements: Goal: Postoperative complications will be avoided or minimized Outcome: Progressing   Problem: Clinical Measurements: Goal: Ability to maintain clinical measurements within normal limits will improve Outcome: Adequate for Discharge Goal: Will remain free from infection Outcome: Adequate for Discharge Goal: Diagnostic test results will improve Outcome: Adequate for Discharge Goal: Respiratory complications will improve Outcome: Adequate for Discharge Goal: Cardiovascular complication will be avoided Outcome: Adequate for Discharge   Problem: Coping: Goal: Level of anxiety will decrease Outcome: Adequate for Discharge   Problem: Safety: Goal: Ability to remain free from injury will improve Outcome: Adequate for Discharge   Problem: Skin Integrity: Goal: Risk for impaired skin integrity will decrease Outcome: Adequate for Discharge   Problem: Education: Goal: Knowledge of the prescribed therapeutic regimen will improve Outcome: Adequate for Discharge   Problem: Bowel/Gastric: Goal: Gastrointestinal status for postoperative course will improve Outcome: Adequate for Discharge   Problem: Respiratory: Goal: Ability to maintain a clear airway will improve Outcome: Adequate for Discharge   Problem: Skin  Integrity: Goal: Demonstration of wound healing without infection will improve Outcome: Adequate for Discharge

## 2018-12-18 NOTE — Progress Notes (Addendum)
      [address]       [place] [address]             [phone]      16 Days Post-Op Procedure(s) (LRB): LEFT NECK EXPLORATION (Left) Subjective: No issues overnight. Excited about going home today  Objective: Vital signs in last 24 hours: Temp:  [97.7 F (36.5 C)-98.3 F (36.8 C)] 98.1 F (36.7 C) (07/01 0717) Pulse Rate:  [60-65] 60 (06/30 1900) Cardiac Rhythm: Normal sinus rhythm (07/01 0450) Resp:  [20-23] 23 (07/01 0314) BP: (97-128)/(54-69) 124/66 (07/01 0717) SpO2:  [97 %-99 %] 99 % (06/30 1900) Weight:  [74.4 kg] 74.4 kg (07/01 0314)     Intake/Output from previous day: 06/30 0701 - 07/01 0700 In: 1467 [P.O.:687; NG/GT:660] Out: -  Intake/Output this shift: No intake/output data recorded.  General appearance: alert, cooperative and no distress Heart: regular rate and rhythm, S1, S2 normal, no murmur, click, rub or gallop Lungs: clear to auscultation bilaterally Abdomen: soft, non-tender; bowel sounds normal; no masses,  no organomegaly Extremities: extremities normal, atraumatic, no cyanosis or edema Wound: clean and dry neck incision today. Dressed wet-to-dry  Lab Results: Recent Labs    12/16/18 0654  WBC 6.5  HGB 10.1*  HCT 31.0*  PLT 468*   BMET:  Recent Labs    12/16/18 0654  NA 137  K 4.2  CL 103  CO2 26  GLUCOSE 124*  BUN 17  CREATININE 0.69  CALCIUM 8.9    PT/INR: No results for input(s): LABPROT, INR in the last 72 hours. ABG    Component Value Date/Time   PHART 7.440 11/26/2018 0511   HCO3 22.7 11/26/2018 0511   TCO2 24 11/26/2018 0511   ACIDBASEDEF 1.0 11/26/2018 0511   O2SAT 97.0 11/26/2018 0511   CBG (last 3)  Recent Labs    12/18/18 0031 12/18/18 0435 12/18/18 0744  GLUCAP 106* 131* 91    Assessment/Plan: S/P Procedure(s) (LRB): LEFT NECK EXPLORATION (Left)  POD 22Transhiatal total esophagectomy with cervical esophagogastrostomy, pyloroplasty, feeding jejunostomyfor adenocarcinoma of  distal esophagus with subsequent left neck exploration for anastomotic leak on 6/15.   Repeat swallow on 6/29 showed a small continued leak. The left eccentric proximal anastomotic leak is still present. This was initially less striking than on the prior exam, but became more prominent when challenged by increase thoracic pressure such as spontaneous cough. Continue tube feedings. Nutrition following.   Daily neck wound dressing changes wet-to-dry. Performed this morning.   Left lower arm IV infiltrate-resolved edema after elevation and heat therapy  Plan: discharge today. Will speak with home health nursing   LOS: 23 days    [name] 12/18/2018  Multiple calls to home nursing to confirm home arrangements Wife has come in for review of home care Plan d/c today I have seen and examined [name] and agree with the above assessment  and plan.  [name] MD Beeper [phone] Office [phone] 12/18/2018 9:21 AM

## 2018-12-18 NOTE — TOC Transition Note (Addendum)
Transition of Care [facility]) - CM/SW Discharge Note   Patient Details  Name: Timarion Agcaoili MRN: [id number] Date of Birth: 1951/03/16  [facility] CM/SW Contact:  Maryclare Labrador, RN Phone Number: 12/18/2018, 11:57 AM   Clinical Narrative:   Pt to discharge home today.  [employer] informed CM that equipment will arrive in the home between 1-5pm [facility] informed of delivery window and will send [facility] out once pt is discharged - [facility] nurse has already been in contact with pts wife  CM also communicated delivery time with bedside nurse so pt will not be discharge home until equipment has been delivered for continuous tube feeds - wife to contact nurse once equipment is delivered.     Final next level of care: [place]     Patient Goals and CMS Choice Patient states their goals for this hospitalization and ongoing recovery are:: Pt states his goal is to be able to eat normal and not need tube feeds CMS Medicare.gov Compare Post Acute Care list provided to:: Patient Choice offered to / list presented to : Patient  Discharge Placement                       Discharge Plan and Services     Post Acute Care Choice: Durable Medical Equipment, Home Health          DME Arranged: Tube feeding, Tube feeding pump DME Agency: [employer] Date DME Agency Contacted: 12/17/18 Time DME Agency Contacted: 1109 Representative spoke with at DME Agency: [place]: RN [place] Agency: [place] Date [place]: 12/16/18 Time [place]: 45 Representative spoke with at [place]: [place] ([place]) Interventions     Readmission Risk Interventions No flowsheet data found.

## 2018-12-18 NOTE — Progress Notes (Signed)
Removed PIV access and pt & wife received discharge instructions. Pt's wife learned how to change dressing neck & abd J-tube area. She understood it well and demonstrated it. Dr. Servando Snare is okay to send patient without tube feeding to home and awaiting for getting the equipment for tube feeding. He can have regular liquid as he drinks in the hospital. Wife got the phone call from home health, delivery between 1-5 pm at home. PA Johann Capers also made aware about it. Patient left to home. HS [employer]

## 2018-12-19 ENCOUNTER — Ambulatory Visit (INDEPENDENT_AMBULATORY_CARE_PROVIDER_SITE_OTHER): Payer: Self-pay | Admitting: Physician Assistant

## 2018-12-19 ENCOUNTER — Telehealth: Payer: Self-pay | Admitting: *Deleted

## 2018-12-19 ENCOUNTER — Encounter: Payer: Self-pay | Admitting: Physician Assistant

## 2018-12-19 VITALS — BP 133/76 | HR 69 | Temp 97.8°F | Resp 20 | Ht 71.0 in | Wt 161.0 lb

## 2018-12-19 DIAGNOSIS — C160 Malignant neoplasm of cardia: Secondary | ICD-10-CM

## 2018-12-19 NOTE — Patient Instructions (Signed)
Continue dressing changes.  ?

## 2018-12-19 NOTE — Telephone Encounter (Signed)
Pt was on TCM report tried calling pt to make hosp f/u appt but there was no answer LMOM RTC.Marland KitchenJohny Chess

## 2018-12-19 NOTE — Progress Notes (Signed)
[address]       [place] [address]             [phone]      Dearl Rudden Holcomb is a 68 y.o. male patient s/p esophagectomy by Dr. Servando Snare on 11/25/2018. He had some complications with an anastomosis leak and was taken back to the OR for a neck exploration on 6/15. Repeat swallow study on 6/29 showed a continued leak but improved from the previous swallow study. It was recommended that he continued to change his left neck incision daily with wet-to-dry dressing and to continued continuous tube feedings. He is taking some liquids and pureed food PO without issue.  Home health nursing and home tube feeding services were set up upon discharge. The patient returns today for a left neck incision dressing change. [place] nursing is able to come out to the house 3 days a week and until his wife feels comfortable changing the dressing on her own, we are providing the dressing changes at the office on the in between days.     1. Adenocarcinoma of gastroesophageal junction (HCC)    Past Medical History:  Diagnosis Date  . Adenocarcinoma of gastroesophageal junction ([place]) 07/29/2018  . Anemia   . Diverticulosis   . Hyperlipidemia   . Hypertension   . Tobacco abuse    Past Surgical History Pertinent Negatives:  Procedure Date Noted  . NO PAST SURGERIES 07/15/2014   Scheduled Meds: Current Outpatient Medications on File Prior to Visit  Medication Sig Dispense Refill  . acetaminophen (TYLENOL) 160 MG/5ML solution Take 10.2 mLs (325 mg total) by mouth every 4 (four) hours as needed for moderate pain. 120 mL 0  . Amino Acids-Protein Hydrolys (FEEDING SUPPLEMENT, PRO-STAT SUGAR FREE 64,) LIQD Place 30 mLs into feeding tube 2 (two) times daily. 887 mL 0  . mupirocin cream (BACTROBAN) 2 % Apply topically daily. Please apply to skin break down near J-tube 15 g 0  . Nutritional Supplements (FEEDING SUPPLEMENT, OSMOLITE 1.5 CAL,) LIQD Place 1,000 mLs into feeding tube continuous  for 21 days. 1000 mL 0  . oxyCODONE (ROXICODONE) 5 MG/5ML solution Place 5 mLs (5 mg total) into feeding tube every 6 (six) hours as needed for up to 20 days for severe pain. 400 mL 0   Current Facility-Administered Medications on File Prior to Visit  Medication Dose Route Frequency Provider Last Rate Last Dose  . alum & mag hydroxide-simeth (MAALOX/MYLANTA) 200-200-20 MG/5ML suspension 30 mL  30 mL Oral Once Harle Stanford., PA-C       And  . lidocaine (XYLOCAINE) 2 % viscous mouth solution 15 mL  15 mL Oral Once Harle Stanford., PA-C        No Known Allergies Active Problems:   *left neck incision, J-tube with TF  Height 5\' 11"  (1.803 m).  Subjective  Here for a dressing change of the neck and J-tube site.    Objective  Left neck incision is healing well requiring less and less packing.  Tissue is well granulated J-tube site is c/d/i. Showed wife how to use peroxide around the site to keep it clean 2 chest tube sutures removed.     Assessment & Plan   The patient plans to have [facility] nursing change both dressings tomorrow and the wife feels good about changing the dressing over the weekend. They will give the office a call tomorrow to see if we need to make an appointment for Monday  to change the dressing again. This will depend on the home health nursing schedule. The wound looked good today and was packed with 1 inch gauze and covered with a 4 x 4 gauze and limited tape to promote some air flow. The dressing change procedure was explained to the wife and she now understands how to change the dressing. The J-tube site will also need daily dressing changes. This was reviewed with the patient and wife. All questions were answered. Dr. Servando Snare also spoke with the patient and his wife. He has a follow-up appointment already scheduled with Dr. Servando Snare on 7/14.    No medication changes. They were unable to fill the prostat sugar free liquid that was ordered but may be able to get it today  from the pharmacy.    [name] 12/19/2018

## 2018-12-19 NOTE — Telephone Encounter (Signed)
Gave ok for orders per Dr. Burns.  

## 2018-12-19 NOTE — Telephone Encounter (Signed)
Patient's wife is returning a call to schedule a follow up appt.  She stated that the patient will call back to schedule the appt.

## 2018-12-19 NOTE — Telephone Encounter (Signed)
ok 

## 2018-12-20 ENCOUNTER — Other Ambulatory Visit: Payer: Self-pay

## 2018-12-20 ENCOUNTER — Encounter (HOSPITAL_COMMUNITY): Payer: Self-pay | Admitting: Emergency Medicine

## 2018-12-20 ENCOUNTER — Emergency Department (HOSPITAL_COMMUNITY)
Admission: EM | Admit: 2018-12-20 | Discharge: 2018-12-20 | Disposition: A | Discharge status: Home / Self Care | Payer: Medicare Other | Attending: Emergency Medicine | Admitting: Emergency Medicine

## 2018-12-20 ENCOUNTER — Emergency Department (HOSPITAL_COMMUNITY): Payer: Medicare Other

## 2018-12-20 DIAGNOSIS — Z87891 Personal history of nicotine dependence: Secondary | ICD-10-CM | POA: Diagnosis not present

## 2018-12-20 DIAGNOSIS — K9423 Gastrostomy malfunction: Secondary | ICD-10-CM

## 2018-12-20 DIAGNOSIS — I10 Essential (primary) hypertension: Secondary | ICD-10-CM | POA: Diagnosis not present

## 2018-12-20 DIAGNOSIS — K9429 Other complications of gastrostomy: Secondary | ICD-10-CM | POA: Insufficient documentation

## 2018-12-20 DIAGNOSIS — Z79899 Other long term (current) drug therapy: Secondary | ICD-10-CM | POA: Insufficient documentation

## 2018-12-20 DIAGNOSIS — T85598A Other mechanical complication of other gastrointestinal prosthetic devices, implants and grafts, initial encounter: Secondary | ICD-10-CM

## 2018-12-20 HISTORY — PX: IR REPLC DUODEN/JEJUNO TUBE PERCUT W/FLUORO: IMG2334

## 2018-12-20 MED ORDER — LIDOCAINE VISCOUS HCL 2 % MT SOLN
OROMUCOSAL | Status: AC
  Filled 2018-12-20: qty 15

## 2018-12-20 MED ORDER — IOHEXOL 300 MG/ML  SOLN
50.0000 mL | Freq: Once | INTRAMUSCULAR | Status: AC | PRN
  Administered 2018-12-20: 15 mL via INTRAVENOUS

## 2018-12-20 MED ORDER — LIDOCAINE VISCOUS HCL 2 % MT SOLN
OROMUCOSAL | Status: AC | PRN
  Administered 2018-12-20: 5 mL via OROMUCOSAL

## 2018-12-20 NOTE — ED Provider Notes (Signed)
[facility] EMERGENCY DEPARTMENT Provider Note   CSN: [id number] Arrival date & time: 12/20/18  0720     History   Chief Complaint Chief Complaint  Patient presents with  . GI Problem    HPI [name] [name] is a 68 y.o. male.     68 year old male presents with feeding tube dislodged since this morning.  Had it placed several days ago and this morning it spontaneously dislodged.  Review the old chart shows that this is a jejunostomy tube.  He did not attempt any treatment prior to arrival.  He has no other complaints at this time.     Past Medical History:  Diagnosis Date  . Adenocarcinoma of gastroesophageal junction ([place]) 07/29/2018  . Anemia   . Diverticulosis   . Hyperlipidemia   . Hypertension   . Tobacco abuse     Patient Active Problem List   Diagnosis Date Noted  . Malnutrition of moderate degree 11/27/2018  . Adenocarcinoma of gastroesophageal junction ([place]) 07/29/2018  . Anemia 05/08/2018  . History of colonic polyps 06/02/2014  . Pre-diabetes 02/24/2013  . Hyperlipidemia 08/05/2009  . Tobacco abuse 05/11/2009  . BPH without obstruction/lower urinary tract symptoms 05/11/2009  . Essential hypertension 04/08/2007    Past Surgical History:  Procedure Laterality Date  . COLONOSCOPY    . COMPLETE ESOPHAGECTOMY N/A 11/25/2018   Procedure: TRANSHIATAL TOTAL ESOPHAGECTOMY COMPLETE WITH CERVICAL ESOPHAGOGASTROSTOMY;  Surgeon: [name], MD;  Location: [facility] OR;  Service: Thoracic;  Laterality: N/A;  . JEJUNOSTOMY N/A 11/25/2018   Procedure: FEEDING JEJUNOSTOMY;  Surgeon: [name], MD;  Location: [facility];  Service: Thoracic;  Laterality: N/A;  . PARASTERNAL EXPLORATION Left 12/02/2018   Procedure: LEFT NECK EXPLORATION;  Surgeon: [name], MD;  Location: [facility];  Service: Thoracic;  Laterality: Left;  . PYLOROPLASTY  11/25/2018   Procedure: Pyloroplasty;  Surgeon: [name], MD;  Location: [facility] OR;  Service:  Thoracic;;  . VIDEO BRONCHOSCOPY N/A 11/25/2018   Procedure: VIDEO BRONCHOSCOPY;  Surgeon: [name], MD;  Location: [facility] OR;  Service: Thoracic;  Laterality: N/A;  . WISDOM TOOTH EXTRACTION          Home Medications    Prior to Admission medications   Medication Sig Start Date End Date Taking? Authorizing Provider  acetaminophen (TYLENOL) 160 MG/5ML solution Take 10.2 mLs (325 mg total) by mouth every 4 (four) hours as needed for moderate pain. 12/18/18   [name], PA-C  Amino Acids-Protein Hydrolys (FEEDING SUPPLEMENT, PRO-STAT SUGAR FREE 64,) LIQD Place 30 mLs into feeding tube 2 (two) times daily. 12/18/18   [name], PA-C  mupirocin cream (BACTROBAN) 2 % Apply topically daily. Please apply to skin break down near J-tube 12/18/18   [name], PA-C  Nutritional Supplements (FEEDING SUPPLEMENT, OSMOLITE 1.5 CAL,) LIQD Place 1,000 mLs into feeding tube continuous for 21 days. 12/18/18 01/08/19  [name], PA-C  oxyCODONE (ROXICODONE) 5 MG/5ML solution Place 5 mLs (5 mg total) into feeding tube every 6 (six) hours as needed for up to 20 days for severe pain. 12/18/18 01/07/19  [name], PA-C    Family History Family History  Problem Relation Age of Onset  . Cancer Mother        bone cancer  . Heart attack Father 75  . Cancer Sister        uterine cancer  . Heart attack Paternal Uncle        X2,  both < 55  . COPD Neg Hx   . Diabetes Neg Hx   . Stroke Neg Hx   . Colon cancer Neg Hx     Social History Social History   Tobacco Use  . Smoking status: Former Smoker    Packs/day: 0.25    Years: 50.00    Pack years: 12.50    Types: Cigarettes    Quit date: 06/19/2018    Years since quitting: 0.5  . Smokeless tobacco: Never Used  . Tobacco comment: 06/02/14 2-3 pp week  Substance Use Topics  . Alcohol use: Yes    Alcohol/week: 2.0 standard drinks    Types: 2 Glasses of wine per week    Comment:  occasionally, not daily  . Drug use: No     Allergies    Patient has no known allergies.   Review of Systems Review of Systems  All other systems reviewed and are negative.    Physical Exam Updated Vital Signs BP 128/72   Pulse 63   Temp 98.2 F (36.8 C) (Oral)   Resp 17   SpO2 97%   Physical Exam Vitals signs and nursing note reviewed.  Constitutional:      General: He is not in acute distress.    Appearance: Normal appearance. He is well-developed. He is not toxic-appearing.  HENT:     Head: Normocephalic and atraumatic.  Eyes:     General: Lids are normal.     Conjunctiva/sclera: Conjunctivae normal.     Pupils: Pupils are equal, round, and reactive to light.  Neck:     Musculoskeletal: Normal range of motion and neck supple.     Thyroid: No thyroid mass.     Trachea: No tracheal deviation.  Cardiovascular:     Rate and Rhythm: Normal rate and regular rhythm.     Heart sounds: Normal heart sounds. No murmur. No gallop.   Pulmonary:     Effort: Pulmonary effort is normal. No respiratory distress.     Breath sounds: Normal breath sounds. No stridor. No decreased breath sounds, wheezing, rhonchi or rales.  Abdominal:     General: Bowel sounds are normal. There is no distension.     Palpations: Abdomen is soft.     Tenderness: There is no abdominal tenderness. There is no rebound.    Musculoskeletal: Normal range of motion.        General: No tenderness.  Skin:    General: Skin is warm and dry.     Findings: No abrasion or rash.  Neurological:     Mental Status: He is alert and oriented to person, place, and time.     GCS: GCS eye subscore is 4. GCS verbal subscore is 5. GCS motor subscore is 6.     Cranial Nerves: No cranial nerve deficit.     Sensory: No sensory deficit.  Psychiatric:        Speech: Speech normal.        Behavior: Behavior normal.      ED Treatments / Results  Labs (all labs ordered are listed, but only abnormal results are displayed) Labs Reviewed - No data to display  EKG None   Radiology No results found.  Procedures Procedures (including critical care time)  Medications Ordered in ED Medications - No data to display   Initial Impression / Assessment and Plan / ED Course  I have reviewed the triage vital signs and the nursing notes.  Pertinent labs & imaging results that were available  during my care of the patient were reviewed by me and considered in my medical decision making (see chart for details).        Attempted to replace J-tube unsuccessfully.  J-tube replaced by IR and patient stable for discharge  Final Clinical Impressions(s) / ED Diagnoses   Final diagnoses:  Feeding tube dysfunction    ED Discharge Orders    None       Lacretia Leigh, MD 12/20/18 1250

## 2018-12-20 NOTE — Discharge Instructions (Addendum)
Followup with your doctor as needed

## 2018-12-20 NOTE — ED Notes (Signed)
Pt wife Adele [phone]

## 2018-12-20 NOTE — ED Notes (Signed)
Patient transported to IR 

## 2018-12-20 NOTE — ED Notes (Signed)
RN spoke with IR PA and she is aware- report given. She has no ETA at this time. Pt updated.

## 2018-12-20 NOTE — ED Notes (Signed)
ED Provider at bedside. 

## 2018-12-20 NOTE — ED Triage Notes (Addendum)
Pt has esophageal cancer and feeding tube placed by Dr. Roxy Horseman. Got up and tube came out. He has nothing at home to use to hold site open. Has large wound in neck that is being packed daily. Balloon was deflated in bag brought by patient. RN checked and balloon did fill and hold 50ml.

## 2018-12-23 ENCOUNTER — Other Ambulatory Visit: Payer: Self-pay

## 2018-12-23 ENCOUNTER — Encounter (HOSPITAL_COMMUNITY): Payer: Self-pay

## 2018-12-23 ENCOUNTER — Ambulatory Visit: Payer: Medicare Other

## 2018-12-24 ENCOUNTER — Encounter: Payer: Self-pay | Admitting: Physician Assistant

## 2018-12-24 ENCOUNTER — Ambulatory Visit (INDEPENDENT_AMBULATORY_CARE_PROVIDER_SITE_OTHER): Payer: Self-pay | Admitting: Physician Assistant

## 2018-12-24 VITALS — BP 134/79 | HR 72 | Temp 97.7°F | Resp 16 | Ht 71.0 in | Wt 167.0 lb

## 2018-12-24 DIAGNOSIS — Z4801 Encounter for change or removal of surgical wound dressing: Secondary | ICD-10-CM

## 2018-12-24 DIAGNOSIS — C160 Malignant neoplasm of cardia: Secondary | ICD-10-CM

## 2018-12-24 NOTE — Progress Notes (Signed)
[address]       [place] [address]             [phone]      [name] [name] is a 68 y.o. male patient s/p esophagectomy by Dr. Servando Snare on 11/25/2018. He had some complications with an anastomosis leak and was taken back to the OR for a neck exploration on 6/15. Repeat swallow study on 6/29 showed a continued leak but improved from the previous swallow study. It was recommended that he continued to change his left neck incision daily with wet-to-dry dressing and to continued continuous tube feedings. He is taking some liquids and pureed food PO without issue.  Home health nursing and home tube feeding services were set up upon discharge. The patient returns today for a left neck incision dressing change. [facility] nursing is able to come out to the house 3 days a week and until his wife feels comfortable changing the dressing on her own, we are providing the dressing changes at the office on the in between days.    1. Adenocarcinoma of gastroesophageal junction (HCC)    Past Medical History:  Diagnosis Date  . Adenocarcinoma of gastroesophageal junction ([place]) 07/29/2018  . Anemia   . Diverticulosis   . Hyperlipidemia   . Hypertension   . Tobacco abuse    Past Surgical History Pertinent Negatives:  Procedure Date Noted  . NO PAST SURGERIES 07/15/2014   Scheduled Meds:  Current Outpatient Medications on File Prior to Visit  Medication Sig Dispense Refill  . acetaminophen (TYLENOL) 160 MG/5ML solution Take 10.2 mLs (325 mg total) by mouth every 4 (four) hours as needed for moderate pain. 120 mL 0  . Amino Acids-Protein Hydrolys (FEEDING SUPPLEMENT, PRO-STAT SUGAR FREE 64,) LIQD Place 30 mLs into feeding tube 2 (two) times daily. 887 mL 0  . mupirocin cream (BACTROBAN) 2 % Apply topically daily. Please apply to skin break down near J-tube 15 g 0  . Nutritional Supplements (FEEDING SUPPLEMENT, OSMOLITE 1.5 CAL,) LIQD Place 1,000 mLs into feeding tube continuous  for 21 days. 1000 mL 0  . oxyCODONE (ROXICODONE) 5 MG/5ML solution Place 5 mLs (5 mg total) into feeding tube every 6 (six) hours as needed for up to 20 days for severe pain. 400 mL 0   Current Facility-Administered Medications on File Prior to Visit  Medication Dose Route Frequency Provider Last Rate Last Dose  . alum & mag hydroxide-simeth (MAALOX/MYLANTA) 200-200-20 MG/5ML suspension 30 mL  30 mL Oral Once Harle Stanford., PA-C       And  . lidocaine (XYLOCAINE) 2 % viscous mouth solution 15 mL  15 mL Oral Once Harle Stanford., PA-C        Height 5\' 11"  (1.803 m).  Subjective  He presents today for his neck dressing change. We also changed his new J-tube site. His J-tube pulled out Friday and he had to get another one placed by IR. He has been having some discomfort at the site and it does appear a little red at the insertion site.   Objective   Cor: RRR Pulm: CTA bilaterally Abd: J-tube in place. Peroxide used to wash around the site and dressed with fresh gauze. Wound: Neck wound is healing well. About 1 cm deep and 1 cm wide at this point. I was able to get about 1.5 inches of 1 inch packing into the incision. It was covered with sterile 4 x 4 gauze.  Ext:  No edema.    Assessment & Plan   Dressing was changed today using sterile gloves. Neck wound measuring 1 x 1 cm. Healing nicely. There was some blood on the q-tip when I measured. Mr. Kervin has been tolerating liquids and soft foods, therefore he was asked to advance to small bites of solid food. I still do not think he is at the point where we can just do nighttime tube feeds. Next week we can better assess how long he will need to continue tube feedings based on how advancing his diet goes this week. He does get some indigestion after eating however, he usually lays down right after a meal. He is asked to continue to sit-up for at least an hour after each meal before laying down. He is trying to prevent his legs from swelling. He  does not have any lower extremity edema today. The J-tube site is slightly red around the insertion site but it was washed with peroxide and dressed today with sterile gauze and tape. Nursing has been coming to the house to change both dressings and assist with tube feedings. His wife has agreed to change the dressing tomorrow for him and he will come back to the office on Friday for an incision check and dressing change. I think next week we may bring him in once but, after that he should be to the point where he can come in less frequently as his wife becomes more comfortable with the dressing changes and his progress.    Next appointment is Friday 7/10 at 1:00pm.  Nicholes Rough, PA-C    [name] 12/24/2018

## 2018-12-26 ENCOUNTER — Ambulatory Visit: Payer: Medicare Other | Admitting: Physician Assistant

## 2018-12-26 ENCOUNTER — Other Ambulatory Visit: Payer: Self-pay | Admitting: Cardiothoracic Surgery

## 2018-12-26 ENCOUNTER — Other Ambulatory Visit: Payer: Self-pay

## 2018-12-26 DIAGNOSIS — R7303 Prediabetes: Secondary | ICD-10-CM

## 2018-12-26 DIAGNOSIS — D630 Anemia in neoplastic disease: Secondary | ICD-10-CM | POA: Diagnosis not present

## 2018-12-26 DIAGNOSIS — Z9181 History of falling: Secondary | ICD-10-CM

## 2018-12-26 DIAGNOSIS — I6523 Occlusion and stenosis of bilateral carotid arteries: Secondary | ICD-10-CM

## 2018-12-26 DIAGNOSIS — Z87891 Personal history of nicotine dependence: Secondary | ICD-10-CM

## 2018-12-26 DIAGNOSIS — I959 Hypotension, unspecified: Secondary | ICD-10-CM

## 2018-12-26 DIAGNOSIS — Z9049 Acquired absence of other specified parts of digestive tract: Secondary | ICD-10-CM

## 2018-12-26 DIAGNOSIS — C160 Malignant neoplasm of cardia: Secondary | ICD-10-CM

## 2018-12-26 DIAGNOSIS — Z86010 Personal history of colonic polyps: Secondary | ICD-10-CM

## 2018-12-26 DIAGNOSIS — E785 Hyperlipidemia, unspecified: Secondary | ICD-10-CM

## 2018-12-26 DIAGNOSIS — I10 Essential (primary) hypertension: Secondary | ICD-10-CM

## 2018-12-26 DIAGNOSIS — Z434 Encounter for attention to other artificial openings of digestive tract: Secondary | ICD-10-CM

## 2018-12-26 DIAGNOSIS — K5790 Diverticulosis of intestine, part unspecified, without perforation or abscess without bleeding: Secondary | ICD-10-CM

## 2018-12-26 DIAGNOSIS — I714 Abdominal aortic aneurysm, without rupture: Secondary | ICD-10-CM

## 2018-12-26 DIAGNOSIS — E440 Moderate protein-calorie malnutrition: Secondary | ICD-10-CM

## 2018-12-26 DIAGNOSIS — I700 Atherosclerosis of aorta: Secondary | ICD-10-CM

## 2018-12-26 DIAGNOSIS — K4090 Unilateral inguinal hernia, without obstruction or gangrene, not specified as recurrent: Secondary | ICD-10-CM

## 2018-12-26 DIAGNOSIS — K835 Biliary cyst: Secondary | ICD-10-CM

## 2018-12-26 DIAGNOSIS — Z483 Aftercare following surgery for neoplasm: Secondary | ICD-10-CM | POA: Diagnosis not present

## 2018-12-26 DIAGNOSIS — N400 Enlarged prostate without lower urinary tract symptoms: Secondary | ICD-10-CM

## 2018-12-26 DIAGNOSIS — I2510 Atherosclerotic heart disease of native coronary artery without angina pectoris: Secondary | ICD-10-CM

## 2018-12-26 DIAGNOSIS — Z79891 Long term (current) use of opiate analgesic: Secondary | ICD-10-CM

## 2018-12-26 DIAGNOSIS — Z86018 Personal history of other benign neoplasm: Secondary | ICD-10-CM

## 2018-12-26 DIAGNOSIS — M47815 Spondylosis without myelopathy or radiculopathy, thoracolumbar region: Secondary | ICD-10-CM

## 2018-12-27 ENCOUNTER — Ambulatory Visit (INDEPENDENT_AMBULATORY_CARE_PROVIDER_SITE_OTHER): Payer: Self-pay | Admitting: Physician Assistant

## 2018-12-27 VITALS — BP 129/80 | HR 73 | Temp 97.7°F | Resp 20 | Wt 155.0 lb

## 2018-12-27 DIAGNOSIS — C160 Malignant neoplasm of cardia: Secondary | ICD-10-CM

## 2018-12-27 DIAGNOSIS — Z4801 Encounter for change or removal of surgical wound dressing: Secondary | ICD-10-CM

## 2018-12-27 NOTE — Progress Notes (Signed)
[address]       [place] [address]             [phone]        [name] [name] is a 68 y.o. male patient s/p esophagectomy by Dr. Servando Snare on 11/25/2018. He had some complications with an anastomosis leak and was taken back to the OR for a neck exploration on 6/15. Repeat swallow study on 6/29 showed a continued leak but improved from the previous swallow study. It was recommended that he continued to change his left neck incision daily with wet-to-dry dressing and to continued continuous tube feedings. He is taking some liquids and pureed food PO without issue.  Home health nursing and home tube feeding services were set up upon discharge.   The patient returns today for a left neck incision dressing change. [name] [name] nursing is able to come out to the house 3 days a week and until his wife feels comfortable changing the dressing on her own, we are providing the dressing changes at the office on the in between days.    1. Adenocarcinoma of gastroesophageal junction ([place])   2. Dressing change or removal, surgical wound    Past Medical History:  Diagnosis Date  . Adenocarcinoma of gastroesophageal junction ([place]) 07/29/2018  . Anemia   . Diverticulosis   . Hyperlipidemia   . Hypertension   . Tobacco abuse    Past Surgical History Pertinent Negatives:  Procedure Date Noted  . NO PAST SURGERIES 07/15/2014   Scheduled Meds: Current Outpatient Medications on File Prior to Visit  Medication Sig Dispense Refill  . acetaminophen (TYLENOL) 160 MG/5ML solution Take 10.2 mLs (325 mg total) by mouth every 4 (four) hours as needed for moderate pain. 120 mL 0  . Amino Acids-Protein Hydrolys (FEEDING SUPPLEMENT, PRO-STAT SUGAR FREE 64,) LIQD Place 30 mLs into feeding tube 2 (two) times daily. 887 mL 0  . mupirocin cream (BACTROBAN) 2 % Apply topically daily. Please apply to skin break down near J-tube 15 g 0  . Nutritional Supplements (FEEDING SUPPLEMENT, OSMOLITE 1.5 CAL,)  LIQD Place 1,000 mLs into feeding tube continuous for 21 days. 1000 mL 0  . oxyCODONE (ROXICODONE) 5 MG/5ML solution Place 5 mLs (5 mg total) into feeding tube every 6 (six) hours as needed for up to 20 days for severe pain. 400 mL 0   Current Facility-Administered Medications on File Prior to Visit  Medication Dose Route Frequency Provider Last Rate Last Dose  . alum & mag hydroxide-simeth (MAALOX/MYLANTA) 200-200-20 MG/5ML suspension 30 mL  30 mL Oral Once Harle Stanford., PA-C       And  . lidocaine (XYLOCAINE) 2 % viscous mouth solution 15 mL  15 mL Oral Once Harle Stanford., PA-C        No Known Allergies Active Problems:   * No active hospital problems. *  Blood pressure 129/80, pulse 73, temperature 97.7 F (36.5 C), temperature source Skin, resp. rate 20, weight 155 lb (70.3 kg), SpO2 98 %.  Subjective The patient returns to the office for a left neck dressing change. We will also change his Jejunostomy tube site. Home health is assisting the other days and his wife is comfortable performing the changes over the weekend.  Objective    Cor: Regular rate and rhythm, no murmur Pulm: Clear to auscultation bilaterally and in all fields Abdomen: Tenderness around the J-tube site Wound: Left neck incision is clean and dry with only  a small red drainage on the wick.  No drainage on the exterior dressing.  Tissue is granulating well and it was repacked today with half inch wick and 4 x 4 dressings.  It was loosely held together with tape.  The J-tube dressing was changed and I did notice some increased redness around the tube site.  It was cleaned with peroxide and triple antibiotic ointment was applied as well as a 4 x 4 gauze dressing underneath the circular stopper and on top of the circular stopper.  It was held together with tape.    Assessment & Plan   [name] is a 68 year old male patient status post esophagectomy who returns the office today for a left neck dressing change and  packing.  The left neck incision is about 1 cm deep and 1 cm wide.  I was able to pack today with 1/2 inch wick.  The incision was then covered with 4 x 4 sterile gauze and tape.  The wife has been able to change his dressing when home health nursing has been unable to come.  They should have home health arriving on Monday to assist with the dressing change and then the patient has a appointment with Dr. Servando Snare on Tuesday, 12/31/2018.  Patient has been tolerating all soft foods and ate a sandwich with success.  He has been taking small bites and chewing thoroughly before swallowing.  He has not had encouraged to try coarse meat.  I did encourage him to try some soft fish which is a good protein source.  They also are going to try to buy the boost breeze drinks like you had in the hospital.  Does appear that he may have lost a few pounds since his last visit.  He has a weight of 161 pounds recorded from 12/19/2018 and today he weighs 155 pounds.  He has been weighing himself daily at home and keeping track of his weight.  He does not remember any large drops in his weight.  He remains on 24-hour tube feedings in addition to what he is able to take in orally.  The hope is by next week we are able to change him over to nighttime tube feedings and potentially by the following week we are able to get the jejunostomy tube out.  This will depend on how much p.o. intake he is able to tolerate how many calories he is able to get in throughout the day.  I changed his G-tube dressing today and he does have some skin irritation around the tube site.  I cleaned the area with peroxide and I added a triple antibiotic ointment in addition to sterile 4 x 4 gauze dressing against the skin and also on the other side of the J-tube circular stopper.  We use the tape here in the office which seems to be less irritating on the skin than the paper tape the patient has at home.  I supplied the wife with a roll of the soft tape and also the  half inch packing since she only has 1 inch packing at home.  Otherwise, the patient is doing well he does still have some residual soreness of the J-tube site.  All questions were answered to the patient's and wife satisfaction.  He is encouraged to call our office with any additional questions.  Dr. Servando Snare has an appointment with the patient on 7/14. We will see him that day and then plan for the rest of the week which day  he needs to come back into the office for a dressing change based on the home health nursing schedule.    No additional medications added today.  [name] 12/27/2018

## 2018-12-30 ENCOUNTER — Inpatient Hospital Stay: Payer: Medicare Other | Attending: Nurse Practitioner | Admitting: Oncology

## 2018-12-30 ENCOUNTER — Other Ambulatory Visit: Payer: Self-pay

## 2018-12-30 ENCOUNTER — Telehealth: Payer: Self-pay | Admitting: Oncology

## 2018-12-30 VITALS — BP 132/79 | HR 64 | Temp 99.4°F | Resp 17 | Ht 71.0 in | Wt 156.3 lb

## 2018-12-30 DIAGNOSIS — I10 Essential (primary) hypertension: Secondary | ICD-10-CM | POA: Insufficient documentation

## 2018-12-30 DIAGNOSIS — F17210 Nicotine dependence, cigarettes, uncomplicated: Secondary | ICD-10-CM | POA: Insufficient documentation

## 2018-12-30 DIAGNOSIS — D126 Benign neoplasm of colon, unspecified: Secondary | ICD-10-CM | POA: Diagnosis not present

## 2018-12-30 DIAGNOSIS — C778 Secondary and unspecified malignant neoplasm of lymph nodes of multiple regions: Secondary | ICD-10-CM | POA: Diagnosis not present

## 2018-12-30 DIAGNOSIS — K635 Polyp of colon: Secondary | ICD-10-CM | POA: Diagnosis not present

## 2018-12-30 DIAGNOSIS — Z79899 Other long term (current) drug therapy: Secondary | ICD-10-CM | POA: Insufficient documentation

## 2018-12-30 DIAGNOSIS — D508 Other iron deficiency anemias: Secondary | ICD-10-CM | POA: Insufficient documentation

## 2018-12-30 DIAGNOSIS — C160 Malignant neoplasm of cardia: Secondary | ICD-10-CM

## 2018-12-30 DIAGNOSIS — E785 Hyperlipidemia, unspecified: Secondary | ICD-10-CM | POA: Diagnosis not present

## 2018-12-30 DIAGNOSIS — C155 Malignant neoplasm of lower third of esophagus: Secondary | ICD-10-CM | POA: Diagnosis not present

## 2018-12-30 NOTE — Progress Notes (Signed)
[facility] OFFICE PROGRESS NOTE   Diagnosis: Esophagus cancer  INTERVAL HISTORY:   Mr. Radle underwent a transhiatal total esophagectomy and placement of a feeding tube on 11/25/2018.  He had a prolonged postoperative stay.  He was noted to have an anastomotic leak in the neck went a left neck incision and drainage on 12/02/2018.  The left neck incision continues to heal.  The wound is packed.  He is followed at the surgical clinic for dressing changes.  He continues tube feedings.  He reports tolerating a diet.  The pathology ([id number]) revealed invasive moderate to poorly differentiated adenocarcinoma measuring at least 20 cm.  Tumor involves the distal esophagus, GE junction, and proximal stomach.  The tumor midpoint was in the proximal stomach.  Tumor invaded the visceral peritoneum and the proximal margin is positive.  The distal gastric resection margin is also positive.  Metastatic carcinoma was noted in 5 of 13 lymph nodes.  Lymphovascular and perineural invasion are present.  No treatment effect is present.  Objective:  Vital signs in last 24 hours:  Blood pressure 132/79, pulse 64, temperature 99.4 F (37.4 C), temperature source Oral, resp. rate 17, height 5\' 11"  (1.803 m), weight 156 lb 4.8 oz (70.9 kg), SpO2 97 %.    HEENT: Gauze dressing of the left lower neck GI: No hepatomegaly, nontender, left upper quadrant feeding tube site with a gauze dressing Vascular: No leg edem  Skin: Healed midline upper abdominal incision    Lab Results:  Lab Results  Component Value Date   WBC 6.5 12/16/2018   HGB 10.1 (L) 12/16/2018   HCT 31.0 (L) 12/16/2018   MCV 91.4 12/16/2018   PLT 468 (H) 12/16/2018   NEUTROABS 3.1 09/17/2018    CMP  Lab Results  Component Value Date   NA 137 12/16/2018   K 4.2 12/16/2018   CL 103 12/16/2018   CO2 26 12/16/2018   GLUCOSE 124 (H) 12/16/2018   BUN 17 12/16/2018   CREATININE 0.69 12/16/2018   CALCIUM 8.9 12/16/2018   PROT 6.7 11/22/2018   ALBUMIN 3.9 11/22/2018   AST 18 11/22/2018   ALT 14 11/22/2018   ALKPHOS 75 11/22/2018   BILITOT 0.5 11/22/2018   GFRNONAA >60 12/16/2018   GFRAA >60 12/16/2018     Medications: I have reviewed the patient's current medications.   Assessment/Plan: 1. 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 Upper endoscopy 07/25/2018-esophageal mass at 38-41 cm extending to the GE junction and gastric cardia, biopsy confirmed adenocarcinoma ? CTs 08/01/2018-wall thickening at the distal esophagus extending to the GE junction and gastric cardia, gastrohepatic ligament lymphadenopathy, perigastric lymph node, too small to characterize liver lesion, bilateral adrenal adenomas ? PET scan 08/09/2018-hypermetabolic mass spanning the gastroesophageal junction. Clustered gastrohepatic ligament lymph nodes likely involved with maximum SUV 3.2. ? Radiation 08/21/2018-09/27/2018 ? Cycle 1 weekly Taxol/carboplatin 08/21/2018 ? Cycle 2 weekly Taxol/carboplatin 08/27/2018 ? Cycle 3 weekly Taxol/carboplatin 09/03/2018 ? Cycle 4 weekly Taxol/carboplatin 09/10/2018 ? Cycle 5 weekly Taxol/carboplatin 09/17/2018 ? CTs 10/24/2018- gastric cardia portion of the gastroesophageal mass less striking than on the 08/09/2018 exam.  Adjacent gastrohepatic ligament adenopathy is still present with clustered nodes measuring 1.2 cm.  Aortocaval node measures 0.8 cm, formerly the same, not formally hypermetabolic. ? Esophagogastrectomy 11/25/2018- moderate to poorly differentiated adenocarcinoma measuring at least 20 cm involving the distal esophagus, GE junction, and proximal stomach.  Tumor midpoint and the proximal stomach.  Tumor invades the visceral peritoneum.  Proximal and distal resection margins  are positive, 5/13 lymph nodes positive, lymphovascular and perineural invasion present, absent treatment effect, pT4a, pN 2  2. Iron deficiency anemia secondary to #1,improved 3. Colon polyps,  tubular adenomas, identified on colonoscopy 07/25/2018 4. Hypertension 5. Hyperlipidemia 6. Tobacco use   Disposition: Mr. Hoying is recovering from the esophagogastrectomy procedure.  He has been diagnosed with locally advanced gastroesophageal cancer, with tumor centered in the proximal stomach.  I reviewed the pathology report with Mr. Gienger.  He understands there is a high chance of developing recurrent disease over the next few years.  We will make a referral to Dr. Fanny Skates to see if he qualifies for a clinical trial at [facility] for patients with node positive disease post resection.  We will also submit tumor for molecular testing.  He will continue postoperative care with Dr. Servando Snare.  He will return for an office visit in approximately 3 weeks.  Betsy Coder, MD  12/30/2018  10:56 AM

## 2018-12-30 NOTE — Telephone Encounter (Deleted)
Scheduled appt per 7/13 los. ° °Printed calendar and avs. °

## 2018-12-30 NOTE — Telephone Encounter (Signed)
Scheduled appt per 7/13 los. Printed calendar and avs.  Sent referral to [facility].

## 2018-12-31 ENCOUNTER — Encounter: Payer: Self-pay | Admitting: *Deleted

## 2018-12-31 ENCOUNTER — Telehealth: Payer: Self-pay | Admitting: Oncology

## 2018-12-31 ENCOUNTER — Ambulatory Visit (INDEPENDENT_AMBULATORY_CARE_PROVIDER_SITE_OTHER): Payer: Self-pay | Admitting: Cardiothoracic Surgery

## 2018-12-31 ENCOUNTER — Ambulatory Visit
Admission: RE | Admit: 2018-12-31 | Discharge: 2018-12-31 | Disposition: A | Discharge status: Home / Self Care | Payer: Medicare Other | Source: Ambulatory Visit | Attending: Thoracic Surgery (Cardiothoracic Vascular Surgery) | Admitting: Thoracic Surgery (Cardiothoracic Vascular Surgery)

## 2018-12-31 VITALS — BP 140/76 | HR 66 | Temp 97.8°F | Resp 20 | Ht 71.0 in | Wt 156.0 lb

## 2018-12-31 DIAGNOSIS — C160 Malignant neoplasm of cardia: Secondary | ICD-10-CM

## 2018-12-31 DIAGNOSIS — Z4801 Encounter for change or removal of surgical wound dressing: Secondary | ICD-10-CM

## 2018-12-31 NOTE — Progress Notes (Signed)
Call to [facility] Pathology and spoke with Amber. Requested Foundation 1 and PDL1 testing on case #[id number] per Dr. Gearldine Shown request. Stage IV Dx: X28.1

## 2018-12-31 NOTE — Progress Notes (Signed)
[address]       [place] [address]             [phone]                  Blaike Charles Topp [place] Medical Record #[id number] Date of Birth: Dec 07, 1950  Referring KD:[id number], [name], MD Primary Cardiology: Primary Care:Burns, [name], MD  Chief Complaint:  Follow Up Visit  Cancer Staging Adenocarcinoma of gastroesophageal junction [facility]) Staging form: Esophagus - Adenocarcinoma, AJCC 8th Edition - Pathologic stage from 11/28/2018: Stage IVA (ypT4a, pN2, cM0, G2) - Signed by [name], MD on 12/01/2018  OPERATIVE REPORT DATE OF PROCEDURE:  11/25/2018 PREOPERATIVE DIAGNOSIS:  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 SURGICAL PROCEDURE:  Video bronchoscopy, transhiatal total esophagectomy with cervical esophagogastrostomy, pyloroplasty, feeding jejunostomy, bilateral placement of chest tubes. SURGEON:  [name], MD  History of Present Illness:     Patient continues to improve.  He is now taking solid food without difficulty swallowing.  His neck incision is almost completely healed.     Zubrod Score: At the time of surgery this patient's most appropriate activity status/level should be described as: []     0    Normal activity, no symptoms [x]     1    Restricted in physical strenuous activity but ambulatory, able to do out light work []     2    Ambulatory and capable of self care, unable to do work activities, up and about                 >50 % of waking hours                                                                                   []     3    Only limited self care, in bed greater than 50% of waking hours []     4    Completely disabled, no self care, confined to bed or chair []     5    Moribund  Social History   Tobacco Use  Smoking Status Former Smoker  . Packs/day: 0.25  . Years: 50.00   . Pack years: 12.50  . Types: Cigarettes  . Quit date: 06/19/2018  . Years since quitting: 0.5  Smokeless Tobacco Never Used  Tobacco Comment   06/02/14 2-3 pp week       No Known Allergies  Current Outpatient Medications  Medication Sig Dispense Refill  . acetaminophen (TYLENOL) 160 MG/5ML solution Take 10.2 mLs (325 mg total) by mouth every 4 (four) hours as needed for moderate pain. 120 mL 0  . Amino Acids-Protein Hydrolys (FEEDING SUPPLEMENT, PRO-STAT SUGAR FREE 64,) LIQD Place 30 mLs into feeding tube 2 (two) times daily. 887 mL 0  . mupirocin cream (BACTROBAN) 2 % Apply topically daily. Please apply to skin break down near J-tube 15 g 0  . Nutritional Supplements (FEEDING SUPPLEMENT, OSMOLITE 1.5 CAL,) LIQD Place 1,000 mLs into feeding tube continuous for 21 days. 1000 mL 0  . oxyCODONE (ROXICODONE)  5 MG/5ML solution Place 5 mLs (5 mg total) into feeding tube every 6 (six) hours as needed for up to 20 days for severe pain. 400 mL 0   No current facility-administered medications for this visit.    Facility-Administered Medications Ordered in Other Visits  Medication Dose Route Frequency Provider Last Rate Last Dose  . alum & mag hydroxide-simeth (MAALOX/MYLANTA) 200-200-20 MG/5ML suspension 30 mL  30 mL Oral Once Harle Stanford., PA-C       And  . lidocaine (XYLOCAINE) 2 % viscous mouth solution 15 mL  15 mL Oral Once Harle Stanford., PA-C           Physical Exam: BP 140/76   Pulse 66   Temp 97.8 F (36.6 C) (Skin)   Resp 20   Ht 5\' 11"  (1.803 m)   Wt 156 lb (70.8 kg)   SpO2 97% Comment: RA  BMI 21.76 kg/m   General appearance: alert, cooperative and no distress Neurologic: intact Heart: regular rate and rhythm, S1, S2 normal, no murmur, click, rub or gallop Lungs: clear to auscultation bilaterally Abdomen: soft, non-tender; bowel sounds normal; no masses,  no organomegaly Extremities: extremities normal, atraumatic, no cyanosis or edema and Homans sign is  negative, no sign of DVT Wound: Neck incision is almost completely healed very small Q-tip sized area of packing is now possible, abdominal incision is well-healed, feeding tube site is intact   Diagnostic Studies & Laboratory data:         Recent Radiology Findings: Dg Chest 2 View  Result Date: 12/31/2018 CLINICAL DATA:  Adenocarcinoma of the gastroesophageal junction. EXAM: CHEST - 2 VIEW COMPARISON:  December 04, 2018 FINDINGS: The heart size and mediastinal contours are within normal limits. Patchy consolidation of left lung base is identified, improved compared to prior exam. The previously noted pneumothorax in the right lung is not seen on current exam. There is no pulmonary edema. The visualized skeletal structures are unremarkable. IMPRESSION: Previously noted right pneumothorax is not seen on current exam. Patchy consolidation of left lung base is identified, improved compared prior exam. Electronically Signed   By: Abelardo Diesel M.D.   On: 12/31/2018 15:08      I have independently reviewed the above radiology findings and reviewed findings  with the patient.  Recent Labs: Lab Results  Component Value Date   WBC 6.5 12/16/2018   HGB 10.1 (L) 12/16/2018   HCT 31.0 (L) 12/16/2018   PLT 468 (H) 12/16/2018   GLUCOSE 124 (H) 12/16/2018   CHOL 161 05/06/2018   TRIG 134.0 05/06/2018   HDL 54.20 05/06/2018   LDLDIRECT 131.7 12/27/2009   LDLCALC 80 05/06/2018   ALT 14 11/22/2018   AST 18 11/22/2018   NA 137 12/16/2018   K 4.2 12/16/2018   CL 103 12/16/2018   CREATININE 0.69 12/16/2018   BUN 17 12/16/2018   CO2 26 12/16/2018   TSH 1.24 05/06/2018   INR 1.1 11/22/2018   HGBA1C 5.4 05/06/2018   Wt Readings from Last 3 Encounters:  12/31/18 156 lb (70.8 kg)  12/30/18 156 lb 4.8 oz (70.9 kg)  12/27/18 155 lb (70.3 kg)     Assessment / Plan:   Patient now 5 weeks postop, following transhiatal total esophagectomy, weight is stable he is increasing his p.o. intake.  We will  transition to nighttime tube feedings only.  He will continue with local dressing changes to the left neck.  Plan to see him back in 2 weeks depending on  how he is doing then will schedule a Gastrografin swallow.    Medication Changes: No orders of the defined types were placed in this encounter.    [name] 12/31/2018 3:53 PM

## 2018-12-31 NOTE — Telephone Encounter (Signed)
Faxed records to Dr. Fanny Skates.

## 2019-01-01 ENCOUNTER — Telehealth: Payer: Self-pay | Admitting: Oncology

## 2019-01-01 NOTE — Telephone Encounter (Signed)
Faxed last 3 office visits for Dr. Benay Spice to [facility] with Loleta Books. @ [phone].  Left vm with her to let her also know that I'm only able to fax records from this office and gave her [facility] fax number to request the other information she is looking for.

## 2019-01-09 ENCOUNTER — Encounter (HOSPITAL_COMMUNITY): Payer: Self-pay | Admitting: Oncology

## 2019-01-14 ENCOUNTER — Ambulatory Visit (INDEPENDENT_AMBULATORY_CARE_PROVIDER_SITE_OTHER): Payer: Self-pay | Admitting: Cardiothoracic Surgery

## 2019-01-14 ENCOUNTER — Other Ambulatory Visit: Payer: Self-pay

## 2019-01-14 ENCOUNTER — Encounter: Payer: Self-pay | Admitting: Cardiothoracic Surgery

## 2019-01-14 VITALS — BP 134/77 | HR 66 | Temp 97.7°F | Resp 16 | Ht 71.0 in | Wt 150.0 lb

## 2019-01-14 DIAGNOSIS — Z09 Encounter for follow-up examination after completed treatment for conditions other than malignant neoplasm: Secondary | ICD-10-CM

## 2019-01-14 MED ORDER — GERHARDT'S BUTT CREAM: 1.0000 "application " | TOPICAL_CREAM | Freq: Two times a day (BID) | CUTANEOUS | 1 refills | Status: DC | PRN

## 2019-01-15 ENCOUNTER — Telehealth: Payer: Self-pay | Admitting: Internal Medicine

## 2019-01-15 ENCOUNTER — Ambulatory Visit: Payer: Medicare Other | Admitting: Cardiothoracic Surgery

## 2019-01-15 MED FILL — GERHARDT'S BUTT CREAM: 100000 | 30 days supply | Qty: 60 | Fill #0

## 2019-01-15 NOTE — Telephone Encounter (Signed)
Copied from [place] [id number]. Topic: Quick Communication - Home Health Verbal Orders >> Jan 15, 2019  3:11 PM Erick Blinks wrote: Caller/Agency: Alvin Critchley Number: [phone] Requesting OT/PT/Skilled Nursing/Social Work/Speech Therapy: Discharge, surgeon has discontinued his wound care.

## 2019-01-15 NOTE — Telephone Encounter (Signed)
ok 

## 2019-01-16 ENCOUNTER — Ambulatory Visit: Payer: Medicare Other | Admitting: Cardiothoracic Surgery

## 2019-01-16 NOTE — Progress Notes (Signed)
[address]       [place] [address]             [phone]                  Burnett Charles [facility] [place] Medical Record #[id number] Date of Birth: March 07, 1951  Referring FO:YDXAJOIN, [name], MD Primary Cardiology: Primary Care:Burns, [name], MD  Chief Complaint:  Follow Up Visit  Cancer Staging Adenocarcinoma of gastroesophageal junction [facility]) Staging form: Esophagus - Adenocarcinoma, AJCC 8th Edition - Pathologic stage from 11/28/2018: Stage IVA (ypT4a, pN2, cM0, G2) - Signed by [name], MD on 12/01/2018  OPERATIVE REPORT DATE OF PROCEDURE:  11/25/2018 PREOPERATIVE DIAGNOSIS:  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 SURGICAL PROCEDURE:  Video bronchoscopy, transhiatal total esophagectomy with cervical esophagogastrostomy, pyloroplasty, feeding jejunostomy, bilateral placement of chest tubes. SURGEON:  [name], MD  History of Present Illness:     Patient continues to slowly improve postop, he continues taking a p.o. diet and is swallowing without difficulty.  He has been avoiding tough food products including meat.  He is using nighttime tube feedings only.  Still has irritation around his jejunostomy tube.  He was seen at [facility] for consideration of postop chemotherapy.  He has appointment to discuss this with Dr. Benay Spice next week.    Zubrod Score: At the time of surgery this patient's most appropriate activity status/level should be described as: []     0    Normal activity, no symptoms []     1    Restricted in physical strenuous activity but ambulatory, able to do out light work [x]     2    Ambulatory and capable of self care, unable to do work activities, up and about                 >50 % of waking hours                                                                                   []     3    Only  limited self care, in bed greater than 50% of waking hours []     4    Completely disabled, no self care, confined to bed or chair []     5    Moribund  Social History   Tobacco Use  Smoking Status Former Smoker  . Packs/day: 0.25  . Years: 50.00  . Pack years: 12.50  . Types: Cigarettes  . Quit date: 06/19/2018  . Years since quitting: 0.5  Smokeless Tobacco Never Used  Tobacco Comment   06/02/14 2-3 pp week       No Known Allergies  Current Outpatient Medications  Medication Sig Dispense Refill  . acetaminophen (TYLENOL) 160 MG/5ML solution Take 10.2 mLs (325 mg total) by mouth every 4 (four) hours as needed for moderate pain. 120 mL 0  . Amino Acids-Protein Hydrolys (FEEDING SUPPLEMENT, PRO-STAT SUGAR FREE 64,) LIQD Place 30 mLs into feeding tube 2 (two) times daily. 887 mL 0  . mupirocin cream (BACTROBAN)  2 % Apply topically daily. Please apply to skin break down near J-tube 15 g 0  . Hydrocortisone (Douglas Smolinsky'S BUTT CREAM) CREA Apply 1 application topically 2 (two) times daily as needed for irritation (apply around g tube). 1 each 1   No current facility-administered medications for this visit.    Facility-Administered Medications Ordered in Other Visits  Medication Dose Route Frequency Provider Last Rate Last Dose  . alum & mag hydroxide-simeth (MAALOX/MYLANTA) 200-200-20 MG/5ML suspension 30 mL  30 mL Oral Once Harle Stanford., PA-C       And  . lidocaine (XYLOCAINE) 2 % viscous mouth solution 15 mL  15 mL Oral Once Harle Stanford., PA-C           Physical Exam: BP 134/77 (BP Location: Right Arm, Patient Position: Sitting, Cuff Size: Normal)   Pulse 66   Temp 97.7 F (36.5 C) Comment: thermal  Resp 16   Ht 5\' 11"  (1.803 m)   Wt 150 lb (68 kg)   SpO2 98% Comment: ON RA  BMI 20.92 kg/m   General appearance: alert, cooperative and no distress Head: Normocephalic, without obvious abnormality, atraumatic Neck: no adenopathy, no carotid bruit, no JVD, supple,  symmetrical, trachea midline and thyroid not enlarged, symmetric, no tenderness/mass/nodules Lymph nodes: Cervical, supraclavicular, and axillary nodes normal. Resp: clear to auscultation bilaterally Cardio: regular rate and rhythm, S1, S2 normal, no murmur, click, rub or gallop Extremities: extremities normal, atraumatic, no cyanosis or edema and Homans sign is negative, no sign of DVT Neurologic: Grossly normal  Neck incision is now healed without drainage. J-tube has some skin irritation around it but does not appear infected   Diagnostic Studies & Laboratory data:         Recent Radiology Findings: Dg Chest 2 View  Result Date: 12/31/2018 CLINICAL DATA:  Adenocarcinoma of the gastroesophageal junction. EXAM: CHEST - 2 VIEW COMPARISON:  December 04, 2018 FINDINGS: The heart size and mediastinal contours are within normal limits. Patchy consolidation of left lung base is identified, improved compared to prior exam. The previously noted pneumothorax in the right lung is not seen on current exam. There is no pulmonary edema. The visualized skeletal structures are unremarkable. IMPRESSION: Previously noted right pneumothorax is not seen on current exam. Patchy consolidation of left lung base is identified, improved compared prior exam. Electronically Signed   By: Abelardo Diesel M.D.   On: 12/31/2018 15:08   I   I have independently reviewed the above radiology findings and reviewed findings  with the patient.  Recent Labs: Lab Results  Component Value Date   WBC 6.5 12/16/2018   HGB 10.1 (L) 12/16/2018   HCT 31.0 (L) 12/16/2018   PLT 468 (H) 12/16/2018   GLUCOSE 124 (H) 12/16/2018   CHOL 161 05/06/2018   TRIG 134.0 05/06/2018   HDL 54.20 05/06/2018   LDLDIRECT 131.7 12/27/2009   LDLCALC 80 05/06/2018   ALT 14 11/22/2018   AST 18 11/22/2018   NA 137 12/16/2018   K 4.2 12/16/2018   CL 103 12/16/2018   CREATININE 0.69 12/16/2018   BUN 17 12/16/2018   CO2 26 12/16/2018   TSH 1.24  05/06/2018   INR 1.1 11/22/2018   HGBA1C 5.4 05/06/2018   Wt Readings from Last 3 Encounters:  01/14/19 150 lb (68 kg)  12/31/18 156 lb (70.8 kg)  12/30/18 156 lb 4.8 oz (70.9 kg)     Assessment / Plan:   Patient now 7 weeks postop, following transhiatal total esophagectomy,  he has not increased his weight further, will continue with nighttime tube feedings, he was given prescription for zinc oxide Mycostatin hydrocortisone cream to place around his J-tube to help cut down on irritation. Plan to see him back in 4 weeks      [name] 01/16/2019 9:04 AM

## 2019-01-20 ENCOUNTER — Telehealth: Payer: Self-pay

## 2019-01-20 ENCOUNTER — Other Ambulatory Visit: Payer: Self-pay

## 2019-01-20 ENCOUNTER — Inpatient Hospital Stay: Payer: Medicare Other | Attending: Nurse Practitioner | Admitting: Nurse Practitioner

## 2019-01-20 ENCOUNTER — Telehealth: Payer: Self-pay | Admitting: Oncology

## 2019-01-20 ENCOUNTER — Encounter: Payer: Self-pay | Admitting: Nurse Practitioner

## 2019-01-20 VITALS — BP 127/82 | HR 66 | Temp 98.5°F | Resp 17 | Ht 71.0 in | Wt 152.1 lb

## 2019-01-20 DIAGNOSIS — Z9221 Personal history of antineoplastic chemotherapy: Secondary | ICD-10-CM | POA: Insufficient documentation

## 2019-01-20 DIAGNOSIS — C155 Malignant neoplasm of lower third of esophagus: Secondary | ICD-10-CM | POA: Insufficient documentation

## 2019-01-20 DIAGNOSIS — I10 Essential (primary) hypertension: Secondary | ICD-10-CM | POA: Diagnosis not present

## 2019-01-20 DIAGNOSIS — C160 Malignant neoplasm of cardia: Secondary | ICD-10-CM

## 2019-01-20 DIAGNOSIS — F17210 Nicotine dependence, cigarettes, uncomplicated: Secondary | ICD-10-CM | POA: Diagnosis not present

## 2019-01-20 DIAGNOSIS — R1310 Dysphagia, unspecified: Secondary | ICD-10-CM | POA: Diagnosis not present

## 2019-01-20 DIAGNOSIS — E785 Hyperlipidemia, unspecified: Secondary | ICD-10-CM | POA: Insufficient documentation

## 2019-01-20 DIAGNOSIS — D509 Iron deficiency anemia, unspecified: Secondary | ICD-10-CM | POA: Diagnosis not present

## 2019-01-20 NOTE — Progress Notes (Addendum)
[facility] OFFICE PROGRESS NOTE   Diagnosis: Esophagus cancer  INTERVAL HISTORY:   [name] [name] returns as scheduled.  He continues to have dysphagia.  He thinks this may be slightly worse.  He is able to tolerate thick liquids and soft solids.  He continues nighttime tube feedings.  No constipation/diarrhea.  No nausea.  Objective:  Vital signs in last 24 hours:  Blood pressure 127/82, pulse 66, temperature 98.5 F (36.9 C), temperature source Oral, resp. rate 17, height '5\' 11"'  (1.803 m), weight 152 lb 1.6 oz (69 kg), SpO2 100 %.    HEENT: No thrush or ulcers. GI: Abdomen soft and nontender.  No hepatomegaly.  Left upper quadrant feeding tube site covered with a gauze dressing. Vascular: No leg edema.  Calves soft and nontender. Neuro: Alert and oriented. Skin: Healed midline upper abdominal surgical incision.   Lab Results:  Lab Results  Component Value Date   WBC 6.5 12/16/2018   HGB 10.1 (L) 12/16/2018   HCT 31.0 (L) 12/16/2018   MCV 91.4 12/16/2018   PLT 468 (H) 12/16/2018   NEUTROABS 3.1 09/17/2018    Imaging:  No results found.  Medications: I have reviewed the patient's current medications.  Assessment/Plan: 1. 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 Upper endoscopy 07/25/2018-esophageal mass at 38-41 cm extending to the GE junction and gastric cardia, biopsy confirmed adenocarcinoma ? CTs 08/01/2018-wall thickening at the distal esophagus extending to the GE junction and gastric cardia, gastrohepatic ligament lymphadenopathy, perigastric lymph node, too small to characterize liver lesion, bilateral adrenal adenomas ? PET scan 08/09/2018-hypermetabolic mass spanning the gastroesophageal junction. Clustered gastrohepatic ligament lymph nodes likely involved with maximum SUV 3.2. ? Radiation 08/21/2018-09/27/2018 ? Cycle 1 weekly Taxol/carboplatin 08/21/2018 ? Cycle 2 weekly Taxol/carboplatin 08/27/2018 ? Cycle 3 weekly  Taxol/carboplatin 09/03/2018 ? Cycle 4 weekly Taxol/carboplatin 09/10/2018 ? Cycle 5 weekly Taxol/carboplatin 09/17/2018 ? CTs 10/24/2018- gastric cardia portion of the gastroesophageal mass less striking than on the 08/09/2018 exam.  Adjacent gastrohepatic ligament adenopathy is still present with clustered nodes measuring 1.2 cm.  Aortocaval node measures 0.8 cm, formerly the same, not formally hypermetabolic. ? Esophagogastrectomy 11/25/2018- moderate to poorly differentiated adenocarcinoma measuring at least 20 cm involving the distal esophagus, GE junction, and proximal stomach.  Tumor midpoint and the proximal stomach.  Tumor invades the visceral peritoneum.  Proximal and distal resection margins are positive, 5/13 lymph nodes positive, lymphovascular and perineural invasion present, absent treatment effect, pT4a, pN2; foundation 1-microsatellite stable, tumor mutational burden 1, BRCA2; PDL 1 combined positive score 1; HER-2/neu negative by IHC  2. Iron deficiency anemia secondary to #1,improved 3. Colon polyps, tubular adenomas, identified on colonoscopy 07/25/2018 4. Hypertension 5. Hyperlipidemia 6. Tobacco use  Disposition: Mr. Michels continues to recover from surgery.  He saw Dr. Fanny Skates at [facility].  There are no clinical trials looking at chemotherapy after esophagectomy.  He was presented with options to include additional chemotherapy on the FOLFOX regimen versus watchful waiting.  We discussed the FOLFOX regimen.  He is not interested in proceeding with this at present.  We will obtain restaging CTs at a 4 to 9-monthinterval.  He will return for a follow-up visit in 5 to 6 weeks.  He will contact the office in the interim with any problems.  Patient seen with Dr. SBenay Spice  LNed CardANP/GNP-BC   01/20/2019  11:53 AM This was a shared visit with LNed Card  DuaneJaco has a high chance of developing recurrent esophagus cancer.  He  saw Dr. Fanny Skates.  He is not eligible for a current clinical  trial at [facility].  She recommends observation versus FOLFOX.  The goal of FOLFOX would be to delay recurrence.  He understands the high risk of developing recurrent disease over the next year.  He would like to delay chemotherapy until there is a documented recurrence.  We will submit tumor for HER-2 testing and submit germline BRCA2 testing.  Julieanne Manson, MD

## 2019-01-20 NOTE — Telephone Encounter (Signed)
Scheduled lab and follow up per 01/20/19 los. Patient received avs and calender.

## 2019-01-20 NOTE — Telephone Encounter (Signed)
TC per Lisa to [facility] ([id number]) to request HER-2-neu testing on pathology 11/25/2018. Request done  [id number]         

## 2019-01-30 ENCOUNTER — Encounter: Payer: Self-pay | Admitting: *Deleted

## 2019-01-30 ENCOUNTER — Other Ambulatory Visit: Payer: Self-pay | Admitting: *Deleted

## 2019-01-30 DIAGNOSIS — R1310 Dysphagia, unspecified: Secondary | ICD-10-CM

## 2019-01-30 DIAGNOSIS — C160 Malignant neoplasm of cardia: Secondary | ICD-10-CM

## 2019-01-30 DIAGNOSIS — Z09 Encounter for follow-up examination after completed treatment for conditions other than malignant neoplasm: Secondary | ICD-10-CM

## 2019-01-30 NOTE — Progress Notes (Signed)
[name] called the office to relate that [name] [name] is having trouble swallowing and has lost 5 pounds.  He is s/p esophagectomy 11/25/18.  She states he can only swallow food that has been put through the food processor.  I discussed this with Dr. Servando Snare and he ordered a barium swallow.  I called Mr. Seeley and told him the plan and he agreed.

## 2019-02-03 ENCOUNTER — Ambulatory Visit
Admission: RE | Admit: 2019-02-03 | Discharge: 2019-02-03 | Disposition: A | Discharge status: Home / Self Care | Payer: Medicare Other | Source: Ambulatory Visit | Attending: Cardiothoracic Surgery | Admitting: Cardiothoracic Surgery

## 2019-02-03 ENCOUNTER — Telehealth: Payer: Self-pay

## 2019-02-03 DIAGNOSIS — Z09 Encounter for follow-up examination after completed treatment for conditions other than malignant neoplasm: Secondary | ICD-10-CM

## 2019-02-03 DIAGNOSIS — C160 Malignant neoplasm of cardia: Secondary | ICD-10-CM

## 2019-02-03 DIAGNOSIS — R1310 Dysphagia, unspecified: Secondary | ICD-10-CM

## 2019-02-03 NOTE — Telephone Encounter (Signed)
Patient contacted in regards to his swallow study done today per Dr. Servando Snare.  Patient was advised that patient's Gastroenterologist, Dr. [name] would be notified of his findings and a follow-up appointment would be made. Advised patient to contact our office back if he has not heard from their office in a week.  Patient acknowledged receipt.

## 2019-02-03 NOTE — Telephone Encounter (Signed)
This encounter was created in error - please disregard.

## 2019-02-03 NOTE — Telephone Encounter (Signed)
-----   Message from [name], MD sent at 02/03/2019 12:42 PM EDT ----- Regarding: Review for Dilation Glendell Docker, Will review the swallow that was done today and consider dilating patient for anastomotic stricture at the cervical anastomosis . His post op path was positive even at the cervical margin. He has a feeding tube in place. Until late last week was taking po diet.   [name] [name]  ----- Message ----- From: Buel Ream, Rad Results In Sent: 02/03/2019   9:06 AM EDT To: [name], MD

## 2019-02-04 ENCOUNTER — Other Ambulatory Visit (HOSPITAL_COMMUNITY)
Admission: RE | Admit: 2019-02-04 | Discharge: 2019-02-04 | Disposition: A | Discharge status: Home / Self Care | Payer: Medicare Other | Source: Ambulatory Visit | Attending: Internal Medicine | Admitting: Internal Medicine

## 2019-02-04 ENCOUNTER — Other Ambulatory Visit: Payer: Self-pay

## 2019-02-04 DIAGNOSIS — Z20828 Contact with and (suspected) exposure to other viral communicable diseases: Secondary | ICD-10-CM | POA: Diagnosis not present

## 2019-02-04 DIAGNOSIS — K222 Esophageal obstruction: Secondary | ICD-10-CM

## 2019-02-04 DIAGNOSIS — C160 Malignant neoplasm of cardia: Secondary | ICD-10-CM

## 2019-02-04 DIAGNOSIS — Z01812 Encounter for preprocedural laboratory examination: Secondary | ICD-10-CM | POA: Insufficient documentation

## 2019-02-04 LAB — SARS CORONAVIRUS 2 (TAT 6-24 HRS): SARS Coronavirus 2: NEGATIVE

## 2019-02-04 NOTE — Progress Notes (Signed)
He needs EGD and dilation with fluoro at hospital  Will try to get this done tomorrow vs Thursday  It looks to me like Thursday midday is earliest it could be done that fits my schedule and hospital schedule  He needs to stay on a liquid diet  We will call and arrange  I am ok with moderate sedation if MAC not possible

## 2019-02-04 NOTE — Addendum Note (Signed)
Addended by: Marlon Pel on: 02/04/2019 09:25 AM   Modules accepted: Orders

## 2019-02-05 ENCOUNTER — Encounter (HOSPITAL_COMMUNITY): Payer: Self-pay | Admitting: *Deleted

## 2019-02-05 ENCOUNTER — Other Ambulatory Visit: Payer: Self-pay

## 2019-02-06 ENCOUNTER — Observation Stay (HOSPITAL_COMMUNITY): Payer: Medicare Other

## 2019-02-06 ENCOUNTER — Ambulatory Visit (HOSPITAL_COMMUNITY): Payer: Medicare Other | Admitting: Certified Registered Nurse Anesthetist

## 2019-02-06 ENCOUNTER — Inpatient Hospital Stay (HOSPITAL_COMMUNITY)
Admission: AD | Admit: 2019-02-06 | Discharge: 2019-02-11 | DRG: 393 | Disposition: A | Discharge status: Home / Self Care | Payer: Medicare Other | Attending: Internal Medicine | Admitting: Internal Medicine

## 2019-02-06 ENCOUNTER — Encounter (HOSPITAL_COMMUNITY)
Admission: AD | Disposition: A | Discharge status: Home / Self Care | Payer: Self-pay | Source: Home / Self Care | Attending: Internal Medicine

## 2019-02-06 ENCOUNTER — Encounter (HOSPITAL_COMMUNITY): Payer: Self-pay | Admitting: *Deleted

## 2019-02-06 ENCOUNTER — Ambulatory Visit (HOSPITAL_COMMUNITY): Payer: Medicare Other

## 2019-02-06 ENCOUNTER — Other Ambulatory Visit: Payer: Self-pay | Admitting: Genetic Counselor

## 2019-02-06 DIAGNOSIS — C160 Malignant neoplasm of cardia: Secondary | ICD-10-CM

## 2019-02-06 DIAGNOSIS — I10 Essential (primary) hypertension: Secondary | ICD-10-CM | POA: Diagnosis present

## 2019-02-06 DIAGNOSIS — D638 Anemia in other chronic diseases classified elsewhere: Secondary | ICD-10-CM | POA: Diagnosis present

## 2019-02-06 DIAGNOSIS — E43 Unspecified severe protein-calorie malnutrition: Secondary | ICD-10-CM | POA: Diagnosis present

## 2019-02-06 DIAGNOSIS — Z681 Body mass index (BMI) 19 or less, adult: Secondary | ICD-10-CM

## 2019-02-06 DIAGNOSIS — Z87891 Personal history of nicotine dependence: Secondary | ICD-10-CM

## 2019-02-06 DIAGNOSIS — G8929 Other chronic pain: Secondary | ICD-10-CM | POA: Diagnosis present

## 2019-02-06 DIAGNOSIS — J982 Interstitial emphysema: Secondary | ICD-10-CM | POA: Diagnosis not present

## 2019-02-06 DIAGNOSIS — C7951 Secondary malignant neoplasm of bone: Secondary | ICD-10-CM | POA: Diagnosis present

## 2019-02-06 DIAGNOSIS — R1319 Other dysphagia: Secondary | ICD-10-CM | POA: Diagnosis present

## 2019-02-06 DIAGNOSIS — E785 Hyperlipidemia, unspecified: Secondary | ICD-10-CM | POA: Diagnosis present

## 2019-02-06 DIAGNOSIS — R627 Adult failure to thrive: Secondary | ICD-10-CM | POA: Diagnosis present

## 2019-02-06 DIAGNOSIS — K223 Perforation of esophagus: Secondary | ICD-10-CM

## 2019-02-06 DIAGNOSIS — K222 Esophageal obstruction: Secondary | ICD-10-CM

## 2019-02-06 DIAGNOSIS — Z9221 Personal history of antineoplastic chemotherapy: Secondary | ICD-10-CM

## 2019-02-06 DIAGNOSIS — Z20828 Contact with and (suspected) exposure to other viral communicable diseases: Secondary | ICD-10-CM | POA: Diagnosis present

## 2019-02-06 DIAGNOSIS — D509 Iron deficiency anemia, unspecified: Secondary | ICD-10-CM | POA: Diagnosis present

## 2019-02-06 DIAGNOSIS — Z8501 Personal history of malignant neoplasm of esophagus: Secondary | ICD-10-CM

## 2019-02-06 DIAGNOSIS — Z934 Other artificial openings of gastrointestinal tract status: Secondary | ICD-10-CM

## 2019-02-06 DIAGNOSIS — R1310 Dysphagia, unspecified: Secondary | ICD-10-CM | POA: Diagnosis not present

## 2019-02-06 DIAGNOSIS — Z86010 Personal history of colonic polyps: Secondary | ICD-10-CM

## 2019-02-06 DIAGNOSIS — Z79899 Other long term (current) drug therapy: Secondary | ICD-10-CM

## 2019-02-06 DIAGNOSIS — K9181 Other intraoperative complications of digestive system: Principal | ICD-10-CM | POA: Diagnosis present

## 2019-02-06 DIAGNOSIS — Z9049 Acquired absence of other specified parts of digestive tract: Secondary | ICD-10-CM

## 2019-02-06 HISTORY — PX: BALLOON DILATION: SHX5330

## 2019-02-06 HISTORY — PX: BIOPSY: SHX5522

## 2019-02-06 HISTORY — DX: Prediabetes: R73.03

## 2019-02-06 HISTORY — PX: ESOPHAGOGASTRODUODENOSCOPY (EGD) WITH PROPOFOL: SHX5813

## 2019-02-06 LAB — GLUCOSE, CAPILLARY
Glucose-Capillary: 104 mg/dL — ABNORMAL HIGH (ref 70–99)
Glucose-Capillary: 127 mg/dL — ABNORMAL HIGH (ref 70–99)

## 2019-02-06 LAB — COMPREHENSIVE METABOLIC PANEL
ALT: 23 U/L (ref 0–44)
AST: 21 U/L (ref 15–41)
Albumin: 3.8 g/dL (ref 3.5–5.0)
Alkaline Phosphatase: 108 U/L (ref 38–126)
Anion gap: 9 (ref 5–15)
BUN: 16 mg/dL (ref 8–23)
CO2: 26 mmol/L (ref 22–32)
Calcium: 9.3 mg/dL (ref 8.9–10.3)
Chloride: 103 mmol/L (ref 98–111)
Creatinine, Ser: 0.75 mg/dL (ref 0.61–1.24)
GFR calc Af Amer: >60 mL/min (ref >60)
GFR calc non Af Amer: >60 mL/min (ref >60)
Glucose, Bld: 88 mg/dL (ref 70–99)
Potassium: 4.3 mmol/L (ref 3.5–5.1)
Sodium: 138 mmol/L (ref 135–145)
Total Bilirubin: 0.6 mg/dL (ref 0.3–1.2)
Total Protein: 7 g/dL (ref 6.5–8.1)

## 2019-02-06 LAB — CBC WITH DIFFERENTIAL/PLATELET
Abs Immature Granulocytes: 0.02 10*3/uL (ref 0.00–0.07)
Basophils Absolute: 0.1 10*3/uL (ref 0.0–0.1)
Basophils Relative: 1 %
Eosinophils Absolute: 0.2 10*3/uL (ref 0.0–0.5)
Eosinophils Relative: 4 %
HCT: 42.2 % (ref 39.0–52.0)
Hemoglobin: 13.1 g/dL (ref 13.0–17.0)
Immature Granulocytes: 0 %
Lymphocytes Relative: 19 %
Lymphs Abs: 1.1 10*3/uL (ref 0.7–4.0)
MCH: 28.2 pg (ref 26.0–34.0)
MCHC: 31 g/dL (ref 30.0–36.0)
MCV: 90.8 fL (ref 80.0–100.0)
Monocytes Absolute: 0.5 10*3/uL (ref 0.1–1.0)
Monocytes Relative: 9 %
Neutro Abs: 3.9 10*3/uL (ref 1.7–7.7)
Neutrophils Relative %: 67 %
Platelets: 266 10*3/uL (ref 150–400)
RBC: 4.65 MIL/uL (ref 4.22–5.81)
RDW: 13.2 % (ref 11.5–15.5)
WBC: 5.8 10*3/uL (ref 4.0–10.5)
nRBC: 0 % (ref 0.0–0.2)

## 2019-02-06 LAB — MAGNESIUM: Magnesium: 2.3 mg/dL (ref 1.7–2.4)

## 2019-02-06 LAB — PHOSPHORUS: Phosphorus: 4.7 mg/dL — ABNORMAL HIGH (ref 2.5–4.6)

## 2019-02-06 SURGERY — ESOPHAGOGASTRODUODENOSCOPY (EGD) WITH PROPOFOL
Anesthesia: Monitor Anesthesia Care

## 2019-02-06 MED ORDER — PROPOFOL 10 MG/ML IV BOLUS
INTRAVENOUS | Status: DC | PRN
  Administered 2019-02-06: 40 mg via INTRAVENOUS

## 2019-02-06 MED ORDER — ACETAMINOPHEN 160 MG/5ML PO SOLN
650.0000 mg | ORAL | Status: DC | PRN
  Administered 2019-02-06: 650 mg
  Filled 2019-02-06: qty 20.3

## 2019-02-06 MED ORDER — IOHEXOL 300 MG/ML  SOLN
30.0000 mL | Freq: Once | INTRAMUSCULAR | Status: AC | PRN
  Administered 2019-02-06: 30 mL via ORAL

## 2019-02-06 MED ORDER — PIPERACILLIN-TAZOBACTAM 3.375 G IVPB 30 MIN
3.3750 g | Freq: Once | INTRAVENOUS | Status: AC
  Administered 2019-02-06: 3.375 g via INTRAVENOUS
  Filled 2019-02-06 (×3): qty 50

## 2019-02-06 MED ORDER — SODIUM CHLORIDE 0.9 % IV SOLN: INTRAVENOUS | Status: DC

## 2019-02-06 MED ORDER — SODIUM CHLORIDE (PF) 0.9 % IJ SOLN
INTRAMUSCULAR | Status: AC
  Filled 2019-02-06: qty 50

## 2019-02-06 MED ORDER — DEXTROSE IN LACTATED RINGERS 5 % IV SOLN
INTRAVENOUS | Status: AC
  Administered 2019-02-06 – 2019-02-07 (×3): via INTRAVENOUS

## 2019-02-06 MED ORDER — JEVITY 1.2 CAL PO LIQD
1000.0000 mL | ORAL | Status: DC
  Administered 2019-02-06: 1000 mL
  Filled 2019-02-06 (×2): qty 1000

## 2019-02-06 MED ORDER — ONDANSETRON HCL 4 MG/2ML IJ SOLN
4.0000 mg | Freq: Four times a day (QID) | INTRAMUSCULAR | Status: DC | PRN
  Administered 2019-02-06: 4 mg via INTRAVENOUS
  Filled 2019-02-06: qty 2

## 2019-02-06 MED ORDER — LIDOCAINE 2% (20 MG/ML) 5 ML SYRINGE
INTRAMUSCULAR | Status: DC | PRN
  Administered 2019-02-06: 100 mg via INTRAVENOUS

## 2019-02-06 MED ORDER — GERHARDT'S BUTT CREAM
TOPICAL_CREAM | Freq: Two times a day (BID) | CUTANEOUS | Status: DC
  Administered 2019-02-06 – 2019-02-10 (×7): via TOPICAL
  Administered 2019-02-11: 1 via TOPICAL
  Filled 2019-02-06: qty 1

## 2019-02-06 MED ORDER — IOHEXOL 300 MG/ML  SOLN
75.0000 mL | Freq: Once | INTRAMUSCULAR | Status: AC | PRN
  Administered 2019-02-06: 75 mL via INTRAVENOUS

## 2019-02-06 MED ORDER — MORPHINE SULFATE (PF) 2 MG/ML IV SOLN
1.0000 mg | INTRAVENOUS | Status: DC | PRN
  Administered 2019-02-06 – 2019-02-10 (×14): 1 mg via INTRAVENOUS
  Filled 2019-02-06 (×14): qty 1

## 2019-02-06 MED ORDER — LACTATED RINGERS IV SOLN
INTRAVENOUS | Status: DC
  Administered 2019-02-06: 1000 mL via INTRAVENOUS

## 2019-02-06 MED ORDER — PROPOFOL 500 MG/50ML IV EMUL
INTRAVENOUS | Status: DC | PRN
  Administered 2019-02-06: 125 ug/kg/min via INTRAVENOUS

## 2019-02-06 MED ORDER — HYDROMORPHONE HCL 1 MG/ML IJ SOLN: 0.5000 mg | INTRAMUSCULAR | Status: DC | PRN

## 2019-02-06 MED ORDER — SODIUM CHLORIDE 0.9 % IV BOLUS
500.0000 mL | Freq: Once | INTRAVENOUS | Status: AC
  Administered 2019-02-06: 500 mL via INTRAVENOUS

## 2019-02-06 MED ORDER — PIPERACILLIN-TAZOBACTAM 3.375 G IVPB
3.3750 g | Freq: Three times a day (TID) | INTRAVENOUS | Status: DC
  Administered 2019-02-06 – 2019-02-11 (×14): 3.375 g via INTRAVENOUS
  Filled 2019-02-06 (×14): qty 50

## 2019-02-06 SURGICAL SUPPLY — 15 items

## 2019-02-06 NOTE — H&P (Addendum)
History and Physical  [name] [name] VHQ:[id number] DOB: 24-Nov-1950 DOA: 02/06/2019  Referring physician: Dr. [name] PCP: Binnie Rail, MD  Outpatient Specialists: GI Patient coming from: Home  Chief Complaint: Direct admit from Dr. [name] (GI)  HPI: [name] [name] is a 68 y.o. male with medical history significant for esophageal cancer status post esophagogastrectomy with severe dysphagia and very tight stricture on barium swallow who presented to endoscopy at [facility] for evaluation and dilation.  Patient uses feeding jejunostomy for nutrition.  POD #0 post upper GI endoscopy which showed severe stenosis at the esophageal anastomosis and nodule in the esophagus.  Dilation performed and esophagus was biopsied.  CT neck and chest done to further evaluate post dilation. TRH asked to admit for observation.  ED Course: Direct admit from endoscopy at [facility].  Review of Systems: Review of systems as noted in the HPI. All other systems reviewed and are negative.   Past Medical History:  Diagnosis Date  . Adenocarcinoma of gastroesophageal junction ([place]) 07/29/2018  . Anemia   . Diverticulosis   . Hyperlipidemia   . Hypertension   . Pre-diabetes    but patient's wife states he has never been told that  . Tobacco abuse    Past Surgical History:  Procedure Laterality Date  . COLONOSCOPY    . COMPLETE ESOPHAGECTOMY N/A 11/25/2018   Procedure: TRANSHIATAL TOTAL ESOPHAGECTOMY COMPLETE WITH CERVICAL ESOPHAGOGASTROSTOMY;  Surgeon: [name], MD;  Location: [place];  Service: Thoracic;  Laterality: N/A;  . IR [place] DUODEN/JEJUNO TUBE PERCUT Vivianne Master  12/20/2018  . JEJUNOSTOMY N/A 11/25/2018   Procedure: FEEDING JEJUNOSTOMY;  Surgeon: [name], MD;  Location: [place];  Service: Thoracic;  Laterality: N/A;  . PARASTERNAL EXPLORATION Left 12/02/2018   Procedure: LEFT NECK EXPLORATION;  Surgeon: [name], MD;  Location: [place];  Service: Thoracic;  Laterality: Left;  .  PYLOROPLASTY  11/25/2018   Procedure: Pyloroplasty;  Surgeon: [name], MD;  Location: [place];  Service: Thoracic;;  . VIDEO BRONCHOSCOPY N/A 11/25/2018   Procedure: VIDEO BRONCHOSCOPY;  Surgeon: [name], MD;  Location: [place];  Service: Thoracic;  Laterality: N/A;  . WISDOM TOOTH EXTRACTION      Social History:  reports that he quit smoking about 7 months ago. His smoking use included cigarettes. He has a 12.50 pack-year smoking history. He has never used smokeless tobacco. He reports current alcohol use of about 2.0 standard drinks of alcohol per week. He reports that he does not use drugs.   No Known Allergies  Family History  Problem Relation Age of Onset  . Cancer Mother        bone cancer  . Heart attack Father 86  . Cancer Sister        uterine cancer  . Heart attack Paternal Uncle        X2, both < 55  . COPD Neg Hx   . Diabetes Neg Hx   . Stroke Neg Hx   . Colon cancer Neg Hx       Prior to Admission medications   Medication Sig Start Date End Date Taking? Authorizing Provider  Hydrocortisone ([name]'S BUTT CREAM) CREA Apply 1 application topically 2 (two) times daily as needed for irritation (apply around g tube). 01/14/19  Yes [name], MD  Nutritional Supplements (FEEDING SUPPLEMENT, OSMOLITE 1.5 CAL,) LIQD Place 237 mLs into feeding tube 4 (four) times daily.   Yes [provider]  oxyCODONE (ROXICODONE) 5 MG/5ML  solution Take 5 mg by mouth every 6 (six) hours as needed for severe pain.   Yes [provider]  acetaminophen (TYLENOL) 160 MG/5ML solution Take 10.2 mLs (325 mg total) by mouth every 4 (four) hours as needed for moderate pain. Patient not taking: Reported on 02/05/2019 12/18/18   [name], PA-C  Amino Acids-Protein Hydrolys (FEEDING SUPPLEMENT, PRO-STAT SUGAR FREE 64,) LIQD Place 30 mLs into feeding tube 2 (two) times daily. Patient not taking: Reported on 02/05/2019 12/18/18   [name], PA-C  mupirocin  cream (BACTROBAN) 2 % Apply topically daily. Please apply to skin break down near J-tube Patient not taking: Reported on 02/05/2019 12/18/18   [name], PA-C    Physical Exam: BP 137/68 (BP Location: Right Arm)   Pulse (!) 52   Temp 97.7 F (36.5 C) (Oral)   Resp 17   Ht 6' (1.829 m)   Wt 66.2 kg   SpO2 100%   BMI 19.80 kg/m   . General: 68 y.o. year-old male Thin built, in no acute distress.  Alert and oriented x4. . Cardiovascular: Regular rate and rhythm with no rubs or gallops.  No thyromegaly or JVD noted.  No lower extremity edema. 2/4 pulses in all 4 extremities. Marland Kitchen Respiratory: Clear to auscultation with no wheezes or rales. Good inspiratory effort. . Abdomen: Soft nontender nondistended with normal bowel sounds x4 quadrants.  J-tube in place. . Muskuloskeletal: No cyanosis, clubbing or edema noted bilaterally . Neuro: CN II-XII intact, strength, sensation, reflexes . Skin: No ulcerative lesions noted or rashes . Psychiatry: Judgement and insight appear normal. Mood is appropriate for condition and setting          Labs on Admission:  Basic Metabolic Panel: Recent Labs  Lab 02/06/19 1505  NA 138  K 4.3  CL 103  CO2 26  GLUCOSE 88  BUN 16  CREATININE 0.75  CALCIUM 9.3  MG 2.3  PHOS 4.7*   Liver Function Tests: Recent Labs  Lab 02/06/19 1505  AST 21  ALT 23  ALKPHOS 108  BILITOT 0.6  PROT 7.0  ALBUMIN 3.8   No results for input(s): LIPASE, AMYLASE in the last 168 hours. No results for input(s): AMMONIA in the last 168 hours. CBC: Recent Labs  Lab 02/06/19 1505  WBC 5.8  NEUTROABS 3.9  HGB 13.1  HCT 42.2  MCV 90.8  PLT 266   Cardiac Enzymes: No results for input(s): CKTOTAL, CKMB, CKMBINDEX, TROPONINI in the last 168 hours.  BNP (last 3 results) No results for input(s): BNP in the last 8760 hours.  ProBNP (last 3 results) No results for input(s): PROBNP in the last 8760 hours.  CBG: No results for input(s): GLUCAP in the last 168  hours.  Radiological Exams on Admission: Dg C-arm 1-60 Min  Result Date: 02/06/2019 CLINICAL DATA:  Esophageal dilatation under fluoroscopy. EXAM: DG C-ARM 61-120 MIN COMPARISON:  Esophagram 02/03/2019 FINDINGS: Single image from patient's esophageal dilatation under fluoroscopy is submitted for review. Endoscope with wire seen over the level of the cervical/proximal esophagus crossing region of known stricture at the esophagogastric anastomosis. Recommend correlation with findings at the time of the procedure. IMPRESSION: Dilatation of known stricture at the esophagogastric anastomosis as described. Electronically Signed   By: Marin Olp M.D.   On: 02/06/2019 13:37    EKG: I independently viewed the EKG done and my findings are as followed: None available at the time of this visit.  Assessment/Plan Present on Admission: **None**  Active Problems:   Esophageal stricture   Primary adenocarcinoma of esophagogastric junction (HCC)   Stricture esophagus  Esophageal cancer status post esophagogastrectomy with severe dysphagia and very tight stricture on barium swallow  POD #0 post upper GI endoscopy with dilation Nodule found in the esophagus, was biopsied.     CT neck and chest done to further evaluate post dilation N.p.o. Start IV Zosyn empirically per GIs recommendation Start IV fluid hydration D5 LR at 75 cc/h Defer to GI to resume J-tube feeding  Pneumomediastinum/extravasation of contrast from esophagus Possibly secondary to perforation from dilation Start continuous pulse oximetry Obtain chest x-ray in the morning Continue close monitoring  Suspected cancer mets to bones Defer to oncology Optimize pain control  Severe dysphagia post J-tube placement Resume feeding if ok with GI and no planned procedures Strict NPO  Failure to thrive in an adult Continue feeding supplement through J-tube when diet is resumed  Chronic lower back pain Due to n.p.o. we will hold off home  oxycodone IV morphine 1 mg every 4 hours as needed for moderate pain IV Dilaudid 0.5 mg every 6 hours as needed for severe pain   DVT prophylaxis: Subcu Lovenox daily  Code Status: Full code  Family Communication: None at bedside  Disposition Plan: Admit to telemetry unit  Consults called: GI  Admission status: Observation status    Kayleen Memos MD [facility] Pager [phone]  If 7PM-7AM, please contact night-coverage [email or web address] Password [email or web address]  02/06/2019, 5:09 PM

## 2019-02-06 NOTE — Consult Note (Signed)
[address]       [place] [address]             [phone]        [name] [name] [place] Medical Record #[id number] Date of Birth: Mar 14, 1951  Referring: No ref. provider found Primary Care: Binnie Rail, MD Primary Cardiologist:Christopher Angelena Form, MD  Chief Complaint:   Esophageal Cancer , S/P esophageal resection transhiatal, complicated by anastomotic leak  Cancer Staging Adenocarcinoma of gastroesophageal junction [facility]) Staging form: Esophagus - Adenocarcinoma, AJCC 8th Edition - Pathologic stage from 11/28/2018: Stage IVA (ypT4a, pN2, cM0, G2) - Signed by [name], MD on 12/01/2018    History of Present Illness:     Patient well known to me who is recovering from transhiatal total esophagectomy, postop course complicated by anastomotic leak, this was treated with opening the neck incision and healed and the patient had resumed p.o. diet with supplementary jejunal tube feedings at night.  Over the past week he had developed fairly rapid symptoms of difficulty swallowing, Gastrografin swallow showed a suspected a anastomotic stricture.  Patient came in today for esophageal dilatation of the cervical anastomosis where the stricture was evident.  The findings at the stricture were consistent with a anastomotic stricture, some surrounding areas that were suspicious and were biopsied for possible recurrence.  The patient had positive cervical margin, in spite of what was primarily a GE junction tumor.  At the time of dilatation it was suspected that the stomach remnant was perforated below the anastomotic stricture.  This is confirmed on CT scan of the neck and chest with p.o. contrast.  The patient currently feels well has no specific complaints, denies chest pain, he is comfortable sitting in the chair when seen.   Current Activity/ Functional Status: Patient is independent with mobility/ambulation, transfers, ADL's, IADL's.   Zubrod  Score: At the time of surgery this patients most appropriate activity status/level should be described as: []     0    Normal activity, no symptoms []     1    Restricted in physical strenuous activity but ambulatory, able to do out light work [x]     2    Ambulatory and capable of self care, unable to do work activities, up and about                 more than 50%  Of the time                            []     3    Only limited self care, in bed greater than 50% of waking hours []     4    Completely disabled, no self care, confined to bed or chair []     5    Moribund  Past Medical History:  Diagnosis Date   Adenocarcinoma of gastroesophageal junction ([place]) 07/29/2018   Anemia    Diverticulosis    Hyperlipidemia    Hypertension    Pre-diabetes    but patient's wife states he has never been told that   Tobacco abuse     Past Surgical History:  Procedure Laterality Date   COLONOSCOPY     COMPLETE ESOPHAGECTOMY N/A 11/25/2018   Procedure: TRANSHIATAL TOTAL ESOPHAGECTOMY COMPLETE WITH CERVICAL ESOPHAGOGASTROSTOMY;  Surgeon: [name], MD;  Location: [facility];  Service: Thoracic;  Laterality: N/A;   IR REPLC DUODEN/JEJUNO TUBE PERCUT Vivianne Master  12/20/2018   JEJUNOSTOMY N/A 11/25/2018   Procedure: FEEDING JEJUNOSTOMY;  Surgeon: [name], MD;  Location: [facility];  Service: Thoracic;  Laterality: N/A;   PARASTERNAL EXPLORATION Left 12/02/2018   Procedure: LEFT NECK EXPLORATION;  Surgeon: [name], MD;  Location: [facility];  Service: Thoracic;  Laterality: Left;   PYLOROPLASTY  11/25/2018   Procedure: Pyloroplasty;  Surgeon: [name], MD;  Location: [facility];  Service: Thoracic;;   VIDEO BRONCHOSCOPY N/A 11/25/2018   Procedure: VIDEO BRONCHOSCOPY;  Surgeon: [name], MD;  Location: [facility];  Service: Thoracic;  Laterality: N/A;   WISDOM TOOTH EXTRACTION      Social History   Tobacco Use  Smoking Status Former Smoker   Packs/day: 0.25   Years: 50.00    Pack years: 12.50   Types: Cigarettes   Quit date: 06/19/2018   Years since quitting: 0.6  Smokeless Tobacco Never Used  Tobacco Comment   06/02/14 2-3 pp week    Social History   Substance and Sexual Activity  Alcohol Use Yes   Alcohol/week: 2.0 standard drinks   Types: 2 Glasses of wine per week   Comment:  occasionally- None for last 6 months     No Known Allergies  Current Facility-Administered Medications  Medication Dose Route Frequency Provider Last Rate Last Dose   dextrose 5 % in lactated ringers infusion   Intravenous Continuous Irene Pap N, DO   Stopped at 02/06/19 1709   feeding supplement (JEVITY 1.2 CAL) liquid 1,000 mL  1,000 mL Per Tube Continuous [name], MD       [name] [name]'s butt cream   Topical BID [name], MD       HYDROmorphone (DILAUDID) injection 0.5 mg  0.5 mg Intravenous Q4H PRN Irene Pap N, DO       morphine 2 MG/ML injection 1 mg  1 mg Intravenous Q3H PRN Irene Pap N, DO   1 mg at 02/06/19 1749   piperacillin-tazobactam (ZOSYN) IVPB 3.375 g  3.375 g Intravenous Q8H Pham, Anh P, RPH       sodium chloride (PF) 0.9 % injection            Facility-Administered Medications Ordered in Other Encounters  Medication Dose Route Frequency Provider Last Rate Last Dose   alum & mag hydroxide-simeth (MAALOX/MYLANTA) 200-200-20 MG/5ML suspension 30 mL  30 mL Oral Once Harle Stanford., PA-C       And   lidocaine (XYLOCAINE) 2 % viscous mouth solution 15 mL  15 mL Oral Once Harle Stanford., PA-C        Medications Prior to Admission  Medication Sig Dispense Refill Last Dose   Hydrocortisone (Ajane Novella'S BUTT CREAM) CREA Apply 1 application topically 2 (two) times daily as needed for irritation (apply around g tube). 1 each 1 02/06/2019 at Unknown time   Nutritional Supplements (FEEDING SUPPLEMENT, OSMOLITE 1.5 CAL,) LIQD Place 237 mLs into feeding tube 4 (four) times daily.   02/05/2019 at Unknown time   oxyCODONE  (ROXICODONE) 5 MG/5ML solution Take 5 mg by mouth every 6 (six) hours as needed for severe pain.   02/05/2019 at Unknown time   acetaminophen (TYLENOL) 160 MG/5ML solution Take 10.2 mLs (325 mg total) by mouth every 4 (four) hours as needed for moderate pain. (Patient not taking: Reported on 02/05/2019) 120 mL 0 Not Taking at Unknown time   Amino Acids-Protein Hydrolys (FEEDING SUPPLEMENT, PRO-STAT SUGAR FREE 64,) LIQD Place 30 mLs into feeding tube  2 (two) times daily. (Patient not taking: Reported on 02/05/2019) 887 mL 0 Not Taking at Unknown time   mupirocin cream (BACTROBAN) 2 % Apply topically daily. Please apply to skin break down near J-tube (Patient not taking: Reported on 02/05/2019) 15 g 0 Completed Course at Unknown time    Family History  Problem Relation Age of Onset   Cancer Mother        bone cancer   Heart attack Father 36   Cancer Sister        uterine cancer   Heart attack Paternal Uncle        X2, both < 35   COPD Neg Hx    Diabetes Neg Hx    Stroke Neg Hx    Colon cancer Neg Hx      Review of Systems:   Pertinent items are noted in HPI.     Cardiac Review of Systems: Y or  [    ]= no  Chest Pain [  n  ]  Resting SOB [  n ] Exertional SOB  [  n]  Orthopnea [ n ]   Pedal Edema [ n  ]    Palpitations [ n ] Syncope  [  n]   Presyncope [  n ]  General Review of Systems: [Y] = yes [  ]=no Constitional: recent weight change [ n ]; anorexia [  ]; fatigue [  ]; nausea [  ]; night sweats [  ]; fever [  ]; or chills [  ]                                                               Dental: Last Dentist visit:   Eye : blurred vision [  ]; diplopia [   ]; vision changes [  ];  Amaurosis fugax[  ]; Resp: cough [  ];  wheezing[  ];  hemoptysis[  ]; shortness of breath[  ]; paroxysmal nocturnal dyspnea[  ]; dyspnea on exertion[  ]; or orthopnea[  ];  GI:  gallstones[  ], vomiting[  ];  dysphagia[  ]; melena[  ];  hematochezia [  ]; heartburn[  ];   Hx of  Colonoscopy[   ]; GU: kidney stones [  ]; hematuria[  ];   dysuria [  ];  nocturia[  ];  history of     obstruction Blue.Reese  ]; urinary frequency Blue.Reese  ]             Skin: rash, swelling[  ];, hair loss[  ];  peripheral edema[  ];  or itching[  ]; Musculosketetal: myalgias[  ];  joint swelling[  ];  joint erythema[  ];  joint pain[  ];  back pain[  ];  Heme/Lymph: bruising[  ];  bleeding[  ];  anemia[  ];  Neuro: TIA[  ];  headaches[  ];  stroke[  ];  vertigo[  ];  seizures[  ];   paresthesias[  ];  difficulty walking[  ];  Psych:depression[  ]; anxiety[  ];  Endocrine: diabetes[  ];  thyroid dysfunction[  ];           Physical Exam: BP 137/68 (BP Location: Right Arm)    Pulse (!) 52    Temp 97.7 F (  36.5 C) (Oral)    Resp 17    Ht 6' (1.829 m)    Wt 66.2 kg    SpO2 100%    BMI 19.80 kg/m    General appearance: alert, cooperative, appears stated age and no distress Head: Normocephalic, without obvious abnormality, atraumatic Neck: no adenopathy, no carotid bruit, no JVD, supple, symmetrical, trachea midline, thyroid not enlarged, symmetric, no tenderness/mass/nodules and The left neck incision is well-healed without evidence of infection or drainage Lymph nodes: Cervical, supraclavicular, and axillary nodes normal. Resp: clear to auscultation bilaterally Back: symmetric, no curvature. ROM normal. No CVA tenderness. Cardio: regular rate and rhythm, S1, S2 normal, no murmur, click, rub or gallop GI: soft, non-tender; bowel sounds normal; no masses,  no organomegaly Extremities: extremities normal, atraumatic, no cyanosis or edema and Homans sign is negative, no sign of DVT Neurologic: Grossly normal Jejunostomy tube is in place with some redness around the tube Diagnostic Studies & Laboratory data:     Recent Radiology Findings:   Ct Soft Tissue Neck W Contrast  Result Date: 02/06/2019 CLINICAL DATA:  Esophagectomy. Esophagogastric ostomy. Dilatation of anastomosis today. EXAM: CT NECK WITH CONTRAST  TECHNIQUE: Multidetector CT imaging of the neck was performed using the standard protocol following the bolus administration of intravenous contrast. CONTRAST:  32mL OMNIPAQUE IOHEXOL 300 MG/ML SOLN, 40mL OMNIPAQUE IOHEXOL 300 MG/ML SOLN COMPARISON:  CT neck 12/02/2018 FINDINGS: Pharynx and larynx: Normal pharynx. Epiglottis and larynx normal. Normal airway. Salivary glands: No inflammation, mass, or stone. Thyroid: Small thyroid nodules.  No mass lesion. Lymph nodes: No enlarged lymph nodes in the neck. Vascular: Normal vascular enhancement. Atherosclerotic calcification carotid bifurcation bilaterally. Limited intracranial: Negative Visualized orbits: Negative Mastoids and visualized paranasal sinuses: Bony thickening left maxillary sinus due to chronic inflammation. The remaining sinuses are clear. Skeleton: Ill-defined sclerotic lesions T2 and T4 have developed since 12/02/2018. Sclerotic lesion in the left second rib has progressed in the interval. Probable metastatic disease. No fracture. Upper chest: Extraluminal gas in the mediastinum. Small amount of gas in the right neck. Extraluminal contrast to the left of the anastomosis could be from prior extravasation or from today. The patient did receive a small amount of oral contrast. Findings compatible with mucosal tear from dilatation today. No fluid collection or abscess. Mild soft tissue thickening at the anastomosis could represent inflammation or tumor. Other: None IMPRESSION: 1. Negative for mass or adenopathy in the neck 2. Extraluminal gas in the mediastinum and right neck compatible with perforation following esophagogastroscopy dilatation today. No free fluid or abscess. 3. Sclerotic lesions at T2, T4, and left second rib have progressed since the recent CT compatible with bony metastatic disease. Electronically Signed   By: Franchot Gallo M.D.   On: 02/06/2019 17:18   Ct Chest W Contrast  Result Date: 02/06/2019 CLINICAL DATA:  Status post  esophageal dilatation with history of adenocarcinoma of the gastroesophageal junction. Value 8 for esophageal leak. EXAM: CT CHEST WITH CONTRAST TECHNIQUE: Multidetector CT imaging of the chest was performed during intravenous contrast administration. CONTRAST:  41mL OMNIPAQUE IOHEXOL 300 MG/ML SOLN, 1mL OMNIPAQUE IOHEXOL 300 MG/ML SOLN COMPARISON:  None. FINDINGS: Cardiovascular: Heart size upper normal. No substantial pericardial effusion. Atherosclerotic calcification is noted in the wall of the thoracic aorta. Mediastinum/Nodes: There is evidence of pneumomediastinum with gas in the left paraesophageal space near the proximal anastomosis and tracking anteriorly and up into the neck and down towards the abdomen. As seen on the upper GI series from 3 days ago, there  is an apparent tight stricture of the esophagus near the anastomosis just below the thoracic inlet. At the level of the stricture, there is a 2.8 x 2.2 cm ill-defined soft tissue lesion that appears to essentially circumferentially surround the narrowed lumen. Gas and contrast material are identified in the extraluminal paraesophageal soft tissues in the region of the stricture(axial image 34/series 2). Gas and contrast material are seen in the mediastinum to the left of the proximal esophagus (axial 42/series 2 and coronal 70/series 6). No mediastinal lymphadenopathy. There is no hilar lymphadenopathy. There is no axillary lymphadenopathy. Lungs/Pleura: Dependent atelectasis/infiltrate is noted in the posterior lower lobes bilaterally Upper Abdomen: 9 mm hypoattenuating lesion in the dome of the liver (116/2) is stable since CT of 10/24/2018. A larger low-density lesion in the left liver seen on that study is not evident today. Thickening of the adrenal glands bilaterally, left greater than right is similar to prior. Prior imaging characterized these adrenal findings as adenomatous. Musculoskeletal: 10 mm sclerotic focus posterior T4 vertebral body  (axial 45/2 and sagittal 86/7) not definitely seen on the prior study. There may be a subtle sclerotic lesion in the T2 vertebral body. Sclerosis in the posterior left second rib is similar to prior. IMPRESSION: 1. Pneumomediastinum is associated with extraluminal contrast to the left of the patient's known proximal esophageal stricture. Imaging features consistent with esophageal perforation, most likely at the level of the stricture. 2. Proximal esophageal stricture is in the region the anastomosis and involved by a 2.8 x 2.2 cm focus of ill-defined soft tissue. Recurrent disease a concern. 3. 10 mm subtle sclerotic focus in the posterior T4 vertebral body, not definitely seen previously and concerning for new bony metastatic disease. Potential new lesion at the T2 level. Close attention on follow-up recommended. Critical Value/emergent results were called by me at the time of interpretation on 02/06/2019 at 5:17 pm to Dr. [name] , who verbally acknowledged these results. Electronically Signed   By: Misty Stanley M.D.   On: 02/06/2019 17:18   Dg C-arm 1-60 Min  Result Date: 02/06/2019 CLINICAL DATA:  Esophageal dilatation under fluoroscopy. EXAM: DG C-ARM 61-120 MIN COMPARISON:  Esophagram 02/03/2019 FINDINGS: Single image from patient's esophageal dilatation under fluoroscopy is submitted for review. Endoscope with wire seen over the level of the cervical/proximal esophagus crossing region of known stricture at the esophagogastric anastomosis. Recommend correlation with findings at the time of the procedure. IMPRESSION: Dilatation of known stricture at the esophagogastric anastomosis as described. Electronically Signed   By: Marin Olp M.D.   On: 02/06/2019 13:37     I have independently reviewed the above radiologic studies and discussed with the patient   Recent Lab Findings: Lab Results  Component Value Date   WBC 5.8 02/06/2019   HGB 13.1 02/06/2019   HCT 42.2 02/06/2019   PLT 266  02/06/2019   GLUCOSE 88 02/06/2019   CHOL 161 05/06/2018   TRIG 134.0 05/06/2018   HDL 54.20 05/06/2018   LDLDIRECT 131.7 12/27/2009   LDLCALC 80 05/06/2018   ALT 23 02/06/2019   AST 21 02/06/2019   NA 138 02/06/2019   K 4.3 02/06/2019   CL 103 02/06/2019   CREATININE 0.75 02/06/2019   BUN 16 02/06/2019   CO2 26 02/06/2019   TSH 1.24 05/06/2018   INR 1.1 11/22/2018   HGBA1C 5.4 05/06/2018      Assessment / Plan:   Patient with extensive adenocarcinoma of the esophagus, status post transhiatal total esophagectomy, with poor response  to preoperative chemoradiation.  Patient has developed anastomotic stricture and with dilatation has perforation of the gastric remnant in the mid chest.  Surgical repair would be impossible.  I discussed the findings with the patient and recommended we continue with IV antibiotics with anaerobic coverage, resuming full tube feedings from his jejunostomy tube and monitor for possible abscess infection or mediastinitis.  We will await the biopsy results in addition.  When seen this evening looks surprisingly good, except disappointed that he cannot return home yet.   [name] MD      [address] [place] [address] Office [phone]   Beeper [phone]  02/06/2019 5:53 PM

## 2019-02-06 NOTE — Progress Notes (Signed)
Pharmacy Antibiotic Note  [name] [name] is a 68 y.o. male with hx adenocarcinoma with severe dysphagia and tight stricture on barium swallow (s/p esophagogastrectomy), presented to endoscopy on 8/20 for endoscopic evaluation and dilation.  EGD showed one severe stenosis at the esophageal anastomosis with dilation performed and esophagus nodule was biopsied.  GI recom to treat with abx for now.  - scr 0.75 (crcl~80)  Plan: - zosyn 3.375 gm IV x1 over 30 min, then 3.375 gm IV q8h (infuse over 4 hrs) - with good renal function, pharmacy will sign off. Re-consult [place] if need further assistance. ___________________________________  Height: 6' (182.9 cm) Weight: 146 lb (66.2 kg) IBW/kg (Calculated) : 77.6  Temp (24hrs), Avg:97.9 F (36.6 C), Min:97.7 F (36.5 C), Max:98.1 F (36.7 C)  No results for input(s): WBC, CREATININE, LATICACIDVEN, VANCOTROUGH, VANCOPEAK, VANCORANDOM, GENTTROUGH, GENTPEAK, GENTRANDOM, TOBRATROUGH, TOBRAPEAK, TOBRARND, AMIKACINPEAK, AMIKACINTROU, AMIKACIN in the last 168 hours.  CrCl cannot be calculated (Patient's most recent lab result is older than the maximum 21 days allowed.).    No Known Allergies   Thank you for allowing pharmacy to be a part of this patient's care.  Lynelle Doctor 02/06/2019 1:46 PM

## 2019-02-06 NOTE — Transfer of Care (Signed)
Immediate Anesthesia Transfer of Care Note  Patient: [name] [name]  Procedure(s) Performed: ESOPHAGOGASTRODUODENOSCOPY (EGD) WITH PROPOFOL (N/A ) BALLOON DILATION (N/A ) BIOPSY  Patient Location: PACU and Endoscopy Unit  Anesthesia Type:MAC  Level of Consciousness: awake, alert  and oriented  Airway & Oxygen Therapy: Patient Spontanous Breathing and Patient connected to nasal cannula oxygen  Post-op Assessment: Report given to RN and Post -op Vital signs reviewed and stable  Post vital signs: Reviewed and stable  Last Vitals:  Vitals Value Taken Time  BP 131/79 02/06/19 1249  Temp    Pulse 63 02/06/19 1250  Resp 23 02/06/19 1250  SpO2 98 % 02/06/19 1250  Vitals shown include unvalidated device data.  Last Pain:  Vitals:   02/06/19 1046  TempSrc: Oral  PainSc: 0-No pain         Complications: No apparent anesthesia complications

## 2019-02-06 NOTE — Progress Notes (Signed)
   02/06/19 2145  Vitals  Temp (!) 101.3 F (38.5 C)  Temp Source Oral  BP 134/72  MAP (mmHg) 90  BP Location Right Arm  BP Method Automatic  Patient Position (if appropriate) Lying  Pulse Rate 87  Resp 18  Oxygen Therapy  SpO2 98 %  O2 Device Room Air  MEWS Score  MEWS RR 0  MEWS Pulse 0  MEWS Systolic 0  MEWS LOC 1  MEWS Temp 1  MEWS Score 2  MEWS Score Color Yellow   Pt mews increased due to temp 101. 3. No other acute change. Notified Schorr, NP. 500cc NS bolus initiated and tylenol 650 mg administered. Will recheck vitals frequently and continue to monitor.

## 2019-02-06 NOTE — Progress Notes (Addendum)
    Spoke to Drs Tery Sanfilippo and Carlis Abbott from radiology.  Patient has pneumomediastimum and some extravasation of contrast from esophagus = perforation from today's dilation  There is amorphous tissue surrounding anastomosis, no discrete cancer but does have bone lesions that are enlarging and suspicious for cancer mets.   Would continue NPO and cover with Abx.  Probably not much else to do.  Will f/u tomorrow and determine next steps. May be able to give abx by J tube and go home with oncology f/u.  His proximal and distal resection margins were + for cancer at surgery so I am suspicious that his esophageal stricture was malignant in origin.  Gatha Mayer, MD, [facility] 02/06/2019 5:13 PM Pager [phone]

## 2019-02-06 NOTE — Anesthesia Postprocedure Evaluation (Signed)
Anesthesia Post Note  Patient: [name] [name]  Procedure(s) Performed: ESOPHAGOGASTRODUODENOSCOPY (EGD) WITH PROPOFOL (N/A ) BALLOON DILATION (N/A ) BIOPSY     Patient location during evaluation: Endoscopy Anesthesia Type: MAC Level of consciousness: awake and alert Pain management: pain level controlled Vital Signs Assessment: post-procedure vital signs reviewed and stable Respiratory status: spontaneous breathing, nonlabored ventilation, respiratory function stable and patient connected to nasal cannula oxygen Cardiovascular status: blood pressure returned to baseline and stable Postop Assessment: no apparent nausea or vomiting Anesthetic complications: no    Last Vitals:  Vitals:   02/06/19 1410 02/06/19 1440  BP: 139/67 130/60  Pulse: (!) 47 (!) 42  Resp: 17 17  Temp:    SpO2: 99% 99%    Last Pain:  Vitals:   02/06/19 1247  TempSrc: Temporal  PainSc:                  Pailyn [place]

## 2019-02-06 NOTE — Op Note (Addendum)
[facility] Patient Name: [name] [name] Procedure Date: 02/06/2019 MRN: [id number] Attending MD: Gatha Mayer , MD Date of Birth: 09/05/1950 CSN: [id number] Age: 68 Admit Type: Outpatient Procedure:                Upper GI endoscopy Indications:              Dysphagia, Stricture of the esophagus, For therapy                            of esophageal stricture Providers:                Gatha Mayer, MD, Baird Cancer, RN, Ashley Jacobs, RN, Elspeth Cho Tech., Technician,                            Stephanie [place] CRNA Referring MD:              Medicines:                Propofol per Anesthesia, Monitored Anesthesia Care Complications:            ? perforation vs dilation and opening up of                            pre-existing false lumen and area of inflammation Estimated Blood Loss:     Estimated blood loss was minimal. Procedure:                Pre-Anesthesia Assessment:                           - Prior to the procedure, a History and Physical                            was performed, and patient medications and                            allergies were reviewed. The patient's tolerance of                            previous anesthesia was also reviewed. The risks                            and benefits of the procedure and the sedation                            options and risks were discussed with the patient.                            All questions were answered, and informed consent                            was obtained. Prior Anticoagulants: The patient has  taken no previous anticoagulant or antiplatelet                            agents. ASA Grade Assessment: III - A patient with                            severe systemic disease. After reviewing the risks                            and benefits, the patient was deemed in                            satisfactory condition to undergo the  procedure.                           After obtaining informed consent, the endoscope was                            passed under direct vision. Throughout the                            procedure, the patient's blood pressure, pulse, and                            oxygen saturations were monitored continuously. The                            GIF-H190 ([id number]) Olympus gastroscope was                            introduced through the mouth, and advanced to the                            upper third of esophagus. The upper GI endoscopy                            was accomplished without difficulty. The patient                            tolerated the procedure well. Scope In: Scope Out: Findings:      One severe stenosis was found at the esophageal anastomosis. This       stenosis measured 4 - 5 mm (inner diameter). The stenosis was traversed       after dilation. A TTS dilator was passed through the scope. Dilation       with a 6-7-8 mm balloon just into the stricture and an 01-25-09 mm balloon       dilator (wire-guided) was performed to 10 mm under fluoroscopic       guidance. The dilation site was examined and showed Inflammatory changes       with a lumen and then loss of lumen and inflammatory change. Estimated       blood loss was minimal.      A single 3 mm nodule was found in the upper third of the esophagus.  Biopsies were taken with a cold forceps for histology. Verification of       patient identification for the specimen was done. Estimated blood loss       was minimal. Impression:               - Esophageal stenosis. Dilated. Opened into false                            lumen area 9see above) ? perforation vs existing                            issue.                           - Nodule found in the esophagus. Biopsied. ?                            persistent cancer. Moderate Sedation:      Not Applicable - Patient had care per Anesthesia. Recommendation:           -  Admit the patient to hospital ward for ongoing                            care.                           - Needs admission to hospital - observation status                            - needs labs, IVF and CT neck and chest with                            contrast AND needs to swallow gastrograffen to                            evaluate for esophageal leak/perforation                           Prophylactic antibiotic coverage also while sorting                            this out                           Discussed w/ Dr. Jobie Quaker. GI will follow the                            patient and help determine next steps but will                            likely not see til tomorrow.                           Page me [phone] with any GI ? Procedure Code(s):        --- Professional ---  43220, Esophagoscopy, flexible, transoral; with                            transendoscopic balloon dilation (less than 30 mm                            diameter)                           43202, 59, Esophagoscopy, flexible, transoral; with                            biopsy, single or multiple Diagnosis Code(s):        --- Professional ---                           K22.2, Esophageal obstruction                           K22.8, Other specified diseases of esophagus                           R13.10, Dysphagia, unspecified CPT copyright 2019 American Medical Association. All rights reserved. The codes documented in this report are preliminary and upon coder review may  be revised to meet current compliance requirements. Gatha Mayer, MD 02/06/2019 1:13:49 PM This report has been signed electronically. Number of Addenda: 0

## 2019-02-06 NOTE — Anesthesia Preprocedure Evaluation (Signed)
Anesthesia Evaluation  Patient identified by MRN, date of birth, ID band Patient awake    Reviewed: Allergy & Precautions, NPO status , Patient's Chart, lab work & pertinent test results  Airway Mallampati: II  TM Distance: >3 FB Neck ROM: Full    Dental  (+) Teeth Intact, Dental Advisory Given   Pulmonary former smoker,    breath sounds clear to auscultation       Cardiovascular hypertension,  Rhythm:Regular Rate:Normal     Neuro/Psych    GI/Hepatic   Endo/Other    Renal/GU      Musculoskeletal   Abdominal Normal abdominal exam  (+)   Peds  Hematology   Anesthesia Other Findings   Reproductive/Obstetrics                             Anesthesia Physical  Anesthesia Plan  ASA: II  Anesthesia Plan: MAC   Post-op Pain Management:    Induction:   PONV Risk Score and Plan: 2 and Ondansetron, Midazolam and Treatment may vary due to age or medical condition  Airway Management Planned: Nasal Cannula, Mask and Natural Airway  Additional Equipment: None  Intra-op Plan:   Post-operative Plan: Post-operative intubation/ventilation  Informed Consent: I have reviewed the patients History and Physical, chart, labs and discussed the procedure including the risks, benefits and alternatives for the proposed anesthesia with the patient or authorized representative who has indicated his/her understanding and acceptance.       Plan Discussed with: CRNA  Anesthesia Plan Comments:         Anesthesia Quick Evaluation

## 2019-02-06 NOTE — Progress Notes (Signed)
Pt currently waiting in Endoscopy recovery room for bed placement to hospital. Pt given warm blanket, stretcher adjusted, and wife at bedside, ok per Lezlie Octave and Dr. [name]. This RN will continue to monitor pt until bed placement.

## 2019-02-06 NOTE — H&P (Signed)
[facility] History and Physical   Primary Care Physician:  Binnie Rail, MD   Reason for Procedure:   dysphagia and esophagogastric stricture  Plan:    Upper endoscopy and dilation     HPI: [name] [name] is a 68 y.o. male s/p esophagogastrectomy for adenocarcinoma with severe dysphagia and very tight stricture on barium swallow.  Here for endoscopic evaluation and dilation.  Using feeding jejunostomy for nutrition   Past Medical History:  Diagnosis Date  . Adenocarcinoma of gastroesophageal junction ([place]) 07/29/2018  . Anemia   . Diverticulosis   . Hyperlipidemia   . Hypertension   . Pre-diabetes    but patient's wife states he has never been told that  . Tobacco abuse     Past Surgical History:  Procedure Laterality Date  . COLONOSCOPY    . COMPLETE ESOPHAGECTOMY N/A 11/25/2018   Procedure: TRANSHIATAL TOTAL ESOPHAGECTOMY COMPLETE WITH CERVICAL ESOPHAGOGASTROSTOMY;  Surgeon: [name], MD;  Location: [place];  Service: Thoracic;  Laterality: N/A;  . IR [place] DUODEN/JEJUNO TUBE PERCUT Vivianne Master  12/20/2018  . JEJUNOSTOMY N/A 11/25/2018   Procedure: FEEDING JEJUNOSTOMY;  Surgeon: [name], MD;  Location: [place];  Service: Thoracic;  Laterality: N/A;  . PARASTERNAL EXPLORATION Left 12/02/2018   Procedure: LEFT NECK EXPLORATION;  Surgeon: [name], MD;  Location: [place];  Service: Thoracic;  Laterality: Left;  . PYLOROPLASTY  11/25/2018   Procedure: Pyloroplasty;  Surgeon: [name], MD;  Location: [facility] OR;  Service: Thoracic;;  . VIDEO BRONCHOSCOPY N/A 11/25/2018   Procedure: VIDEO BRONCHOSCOPY;  Surgeon: [name], MD;  Location: [place];  Service: Thoracic;  Laterality: N/A;  . WISDOM TOOTH EXTRACTION      Prior to Admission medications   Medication Sig Start Date End Date Taking? Authorizing Provider  Hydrocortisone ([name]'S BUTT CREAM) CREA Apply 1 application topically 2 (two) times daily as needed for  irritation (apply around g tube). 01/14/19  Yes [name], MD  Nutritional Supplements (FEEDING SUPPLEMENT, OSMOLITE 1.5 CAL,) LIQD Place 237 mLs into feeding tube 4 (four) times daily.   Yes [provider]  oxyCODONE (ROXICODONE) 5 MG/5ML solution Take 5 mg by mouth every 6 (six) hours as needed for severe pain.   Yes [provider]  acetaminophen (TYLENOL) 160 MG/5ML solution Take 10.2 mLs (325 mg total) by mouth every 4 (four) hours as needed for moderate pain. Patient not taking: Reported on 02/05/2019 12/18/18   [name], PA-C  Amino Acids-Protein Hydrolys (FEEDING SUPPLEMENT, PRO-STAT SUGAR FREE 64,) LIQD Place 30 mLs into feeding tube 2 (two) times daily. Patient not taking: Reported on 02/05/2019 12/18/18   [name], PA-C  mupirocin cream (BACTROBAN) 2 % Apply topically daily. Please apply to skin break down near J-tube Patient not taking: Reported on 02/05/2019 12/18/18   [name], PA-C    Current Facility-Administered Medications  Medication Dose Route Frequency Provider Last Rate Last Dose  . 0.9 %  sodium chloride infusion   Intravenous Continuous Gatha Mayer, MD      . lactated ringers infusion   Intravenous Continuous Gatha Mayer, MD 10 mL/hr at 02/06/19 1056 1,000 mL at 02/06/19 1056   Facility-Administered Medications Ordered in Other Encounters  Medication Dose Route Frequency Provider Last Rate Last Dose  . alum & mag hydroxide-simeth (MAALOX/MYLANTA) 200-200-20 MG/5ML suspension 30 mL  30 mL Oral Once Harle Stanford., PA-C       And  .  lidocaine (XYLOCAINE) 2 % viscous mouth solution 15 mL  15 mL Oral Once Harle Stanford., PA-C        Allergies as of 02/04/2019  . (No Known Allergies)    Family History  Problem Relation Age of Onset  . Cancer Mother        bone cancer  . Heart attack Father 22  . Cancer Sister        uterine cancer  . Heart attack Paternal Uncle        X2, both < 55  . COPD Neg Hx   . Diabetes Neg Hx    . Stroke Neg Hx   . Colon cancer Neg Hx       Review of Systems:  All other review of systems negative except as mentioned in the HPI.  Physical Exam: Vital signs in last 24 hours: Temp:  [98.1 F (36.7 C)] 98.1 F (36.7 C) (08/20 1046) Pulse Rate:  [51] 51 (08/20 1046) Resp:  [19] 19 (08/20 1046) Weight:  [66.2 kg] 66.2 kg (08/20 1046)   General:   Alert,  Well-developed, well-nourished, pleasant and cooperative in NAD Lungs:  Clear throughout to auscultation.   Heart:  Regular rate and rhythm; no murmurs, clicks, rubs,  or gallops. Abdomen:  Soft, nontender and nondistended. Normal bowel sounds jejunostomy left side.   Neuro/Psych:  Alert and cooperative. Normal mood and affect. A and O x 3   @Carl  Simonne Maffucci, MD, [facility] [phone] (pager) 02/06/2019 12:12 PM@

## 2019-02-07 ENCOUNTER — Encounter (HOSPITAL_COMMUNITY): Payer: Self-pay | Admitting: Internal Medicine

## 2019-02-07 ENCOUNTER — Observation Stay (HOSPITAL_COMMUNITY): Payer: Medicare Other

## 2019-02-07 DIAGNOSIS — E785 Hyperlipidemia, unspecified: Secondary | ICD-10-CM | POA: Diagnosis present

## 2019-02-07 DIAGNOSIS — Z9221 Personal history of antineoplastic chemotherapy: Secondary | ICD-10-CM | POA: Diagnosis not present

## 2019-02-07 DIAGNOSIS — K222 Esophageal obstruction: Secondary | ICD-10-CM | POA: Diagnosis present

## 2019-02-07 DIAGNOSIS — Z20828 Contact with and (suspected) exposure to other viral communicable diseases: Secondary | ICD-10-CM | POA: Diagnosis present

## 2019-02-07 DIAGNOSIS — Z9049 Acquired absence of other specified parts of digestive tract: Secondary | ICD-10-CM | POA: Diagnosis not present

## 2019-02-07 DIAGNOSIS — Z87891 Personal history of nicotine dependence: Secondary | ICD-10-CM | POA: Diagnosis not present

## 2019-02-07 DIAGNOSIS — Z934 Other artificial openings of gastrointestinal tract status: Secondary | ICD-10-CM | POA: Diagnosis not present

## 2019-02-07 DIAGNOSIS — Z7189 Other specified counseling: Secondary | ICD-10-CM | POA: Diagnosis not present

## 2019-02-07 DIAGNOSIS — C160 Malignant neoplasm of cardia: Secondary | ICD-10-CM

## 2019-02-07 DIAGNOSIS — I10 Essential (primary) hypertension: Secondary | ICD-10-CM | POA: Diagnosis present

## 2019-02-07 DIAGNOSIS — Z8501 Personal history of malignant neoplasm of esophagus: Secondary | ICD-10-CM | POA: Diagnosis not present

## 2019-02-07 DIAGNOSIS — R1319 Other dysphagia: Secondary | ICD-10-CM | POA: Diagnosis present

## 2019-02-07 DIAGNOSIS — E43 Unspecified severe protein-calorie malnutrition: Secondary | ICD-10-CM | POA: Diagnosis present

## 2019-02-07 DIAGNOSIS — C7951 Secondary malignant neoplasm of bone: Secondary | ICD-10-CM | POA: Diagnosis present

## 2019-02-07 DIAGNOSIS — G8929 Other chronic pain: Secondary | ICD-10-CM | POA: Diagnosis present

## 2019-02-07 DIAGNOSIS — Z681 Body mass index (BMI) 19 or less, adult: Secondary | ICD-10-CM | POA: Diagnosis not present

## 2019-02-07 DIAGNOSIS — K223 Perforation of esophagus: Secondary | ICD-10-CM | POA: Diagnosis present

## 2019-02-07 DIAGNOSIS — D509 Iron deficiency anemia, unspecified: Secondary | ICD-10-CM | POA: Diagnosis present

## 2019-02-07 DIAGNOSIS — Z79899 Other long term (current) drug therapy: Secondary | ICD-10-CM | POA: Diagnosis not present

## 2019-02-07 DIAGNOSIS — Z515 Encounter for palliative care: Secondary | ICD-10-CM | POA: Diagnosis not present

## 2019-02-07 DIAGNOSIS — R627 Adult failure to thrive: Secondary | ICD-10-CM | POA: Diagnosis present

## 2019-02-07 DIAGNOSIS — D638 Anemia in other chronic diseases classified elsewhere: Secondary | ICD-10-CM | POA: Diagnosis present

## 2019-02-07 DIAGNOSIS — K9181 Other intraoperative complications of digestive system: Secondary | ICD-10-CM | POA: Diagnosis present

## 2019-02-07 DIAGNOSIS — R52 Pain, unspecified: Secondary | ICD-10-CM | POA: Diagnosis not present

## 2019-02-07 DIAGNOSIS — J982 Interstitial emphysema: Secondary | ICD-10-CM | POA: Diagnosis not present

## 2019-02-07 LAB — CBC
HCT: 41.5 % (ref 39.0–52.0)
Hemoglobin: 13 g/dL (ref 13.0–17.0)
MCH: 28.1 pg (ref 26.0–34.0)
MCHC: 31.3 g/dL (ref 30.0–36.0)
MCV: 89.8 fL (ref 80.0–100.0)
Platelets: 271 10*3/uL (ref 150–400)
RBC: 4.62 MIL/uL (ref 4.22–5.81)
RDW: 13.3 % (ref 11.5–15.5)
WBC: 14.7 10*3/uL — ABNORMAL HIGH (ref 4.0–10.5)
nRBC: 0 % (ref 0.0–0.2)

## 2019-02-07 LAB — BASIC METABOLIC PANEL
Anion gap: 12 (ref 5–15)
BUN: 15 mg/dL (ref 8–23)
CO2: 23 mmol/L (ref 22–32)
Calcium: 8.8 mg/dL — ABNORMAL LOW (ref 8.9–10.3)
Chloride: 102 mmol/L (ref 98–111)
Creatinine, Ser: 1 mg/dL (ref 0.61–1.24)
GFR calc Af Amer: >60 mL/min (ref >60)
GFR calc non Af Amer: >60 mL/min (ref >60)
Glucose, Bld: 160 mg/dL — ABNORMAL HIGH (ref 70–99)
Potassium: 3.9 mmol/L (ref 3.5–5.1)
Sodium: 137 mmol/L (ref 135–145)

## 2019-02-07 LAB — GLUCOSE, CAPILLARY
Glucose-Capillary: 108 mg/dL — ABNORMAL HIGH (ref 70–99)
Glucose-Capillary: 139 mg/dL — ABNORMAL HIGH (ref 70–99)
Glucose-Capillary: 140 mg/dL — ABNORMAL HIGH (ref 70–99)
Glucose-Capillary: 143 mg/dL — ABNORMAL HIGH (ref 70–99)
Glucose-Capillary: 145 mg/dL — ABNORMAL HIGH (ref 70–99)
Glucose-Capillary: 152 mg/dL — ABNORMAL HIGH (ref 70–99)

## 2019-02-07 LAB — SURGICAL PCR SCREEN
MRSA, PCR: NEGATIVE
Staphylococcus aureus: NEGATIVE

## 2019-02-07 MED ORDER — VANCOMYCIN HCL IN DEXTROSE 750-5 MG/150ML-% IV SOLN
750.0000 mg | Freq: Three times a day (TID) | INTRAVENOUS | Status: DC
  Filled 2019-02-07: qty 150

## 2019-02-07 MED ORDER — OSMOLITE 1.5 CAL PO LIQD
1000.0000 mL | ORAL | Status: DC
  Administered 2019-02-08 – 2019-02-11 (×3): 1000 mL
  Filled 2019-02-07 (×8): qty 1000

## 2019-02-07 MED ORDER — FREE WATER
100.0000 mL | Freq: Four times a day (QID) | Status: DC
  Administered 2019-02-07 – 2019-02-11 (×15): 100 mL

## 2019-02-07 MED ORDER — MUPIROCIN 2 % EX OINT
1.0000 "application " | TOPICAL_OINTMENT | Freq: Two times a day (BID) | CUTANEOUS | Status: DC
  Administered 2019-02-07 – 2019-02-11 (×9): 1 via NASAL
  Filled 2019-02-07 (×2): qty 22

## 2019-02-07 MED ORDER — VANCOMYCIN HCL IN DEXTROSE 750-5 MG/150ML-% IV SOLN
750.0000 mg | Freq: Two times a day (BID) | INTRAVENOUS | Status: DC
  Administered 2019-02-07 – 2019-02-11 (×9): 750 mg via INTRAVENOUS
  Filled 2019-02-07 (×11): qty 150

## 2019-02-07 MED ORDER — CLINDAMYCIN PHOSPHATE 600 MG/50ML IV SOLN: 600.0000 mg | Freq: Four times a day (QID) | INTRAVENOUS | Status: DC

## 2019-02-07 MED ORDER — PRO-STAT SUGAR FREE PO LIQD
30.0000 mL | Freq: Two times a day (BID) | ORAL | Status: DC
  Administered 2019-02-07 – 2019-02-11 (×8): 30 mL
  Filled 2019-02-07 (×8): qty 30

## 2019-02-07 NOTE — Consult Note (Signed)
[facility] Nurse wound consult note Reason for Consult: skin care for Gtube Patient has had PEG in place since June, original tube "fell out" per patient and was replaced in June.   Wound type: moisture associated skin damage from gastric leakage at tube entrance  site  Pressure Injury POA: NA Measurement: only mild skin redness noted today at 3 and 9 oclock  Wound bed: redness, not weeping  Drainage (amount, consistency, odor) green on drain sponge. Did not have active drainage with dressing placement Periwound: see above  Dressing procedure/placement/frequency: Used 4x4 silicone foam and 2x4 silicone foam to form tube stabilization, will manage drainage hopefully more successfully as well.  Extra foam cut for example for the patient's wife.  Apply minimal Gerhardts cream to skin irritation prior to dressing placement.   Discussed POC with patient and bedside nurse.  Re consult if needed, will not follow at this time. Thanks  Benjaman Artman [employer], RN,CWOCN, CNS, [place] [phone])

## 2019-02-07 NOTE — Progress Notes (Addendum)
HEMATOLOGY-ONCOLOGY PROGRESS NOTE  SUBJECTIVE: [name] [name] [name] is now admitted following upper endoscopy which showed severe stenosis at the esophageal anastomosis and nodule in the esophagus.  Dilation was performed and the esophagus was biopsied.  Follow-up CT of the neck and chest after procedure showed pneumomediastinum.  Additionally, the CT showed proximal esophageal stricture in the region of the anastomosis and involved by a 2.8 x 2.2 cm focus of ill-defined soft tissue.  There was also a 10 mm subtle sclerotic focus in the posterior T4 body potential new lesion at the level of T2.  The patient reports that he is still unable to swallow.  He has an intermittent productive cough with clear sputum production.  He denies chest discomfort and shortness of breath.  Remains on tube feeds.  He has no other complaints today.  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[place])   08/21/2018 -  Chemotherapy   The patient had palonosetron (ALOXI) injection 0.25 mg, 0.25 mg, Intravenous,  Once, 1 of 1 cycle Administration: 0.25 mg (08/21/2018), 0.25 mg (08/27/2018), 0.25 mg (09/03/2018), 0.25 mg (09/10/2018), 0.25 mg (09/17/2018) CARBOplatin (PARAPLATIN) 200 mg in sodium chloride 0.9 % 250 mL chemo infusion, 200 mg (98.5 % of original dose 204.4 mg), Intravenous,  Once, 1 of 1 cycle Dose modification:   (original dose 204.4 mg, Cycle 1) Administration: 200 mg (08/21/2018), 200 mg (08/27/2018), 200 mg (09/03/2018), 200 mg (09/10/2018), 200 mg (09/17/2018) PACLitaxel (TAXOL) 102 mg in sodium chloride 0.9 % 250 mL chemo infusion (</= 39m/m2), 50 mg/m2 = 102 mg, Intravenous,  Once, 1 of 1 cycle Administration: 102 mg (08/21/2018), 102 mg (08/27/2018), 102 mg (09/03/2018), 102 mg (09/10/2018), 102 mg (09/17/2018)  for chemotherapy treatment.    11/28/2018 Cancer Staging   Staging form: Esophagus - Adenocarcinoma, AJCC 8th Edition -  Pathologic stage from 11/28/2018: Stage IVA (ypT4a, pN2, cM0, G2) - Signed by GGrace Isaac MD on 12/01/2018      REVIEW OF SYSTEMS:   All other systems were reviewed with the patient and are negative.  I have reviewed the past medical history, past surgical history, social history and family history with the patient and they are unchanged from previous note.   PHYSICAL EXAMINATION:  Vitals:   02/07/19 0431 02/07/19 1201  BP: (!) 108/59 (!) 104/59  Pulse: 67 67  Resp: 18 20  Temp: 98.2 F (36.8 C) 98.8 F (37.1 C)  SpO2: 97% 97%   Filed Weights   02/06/19 1046 02/07/19 0431  Weight: 146 lb (66.2 kg) 147 lb 11.3 oz (67 kg)    Intake/Output from previous day: 08/20 0701 - 08/21 0700 In: 11700[I.V.:1001.8; NG/GT:102.8; IV Piggyback:26.3] Out: -   GENERAL:alert, no distress and comfortable OROPHARYNX: No thrush LUNGS: clear to auscultation and percussion with normal breathing effort HEART: regular rate & rhythm and no murmurs and no lower extremity edema ABDOMEN:abdomen soft, non-tender and normal bowel sounds.  Musculoskeletal:no cyanosis of digits and no clubbing  NEURO: alert & oriented x 3 with fluent speech, no focal motor/sensory deficits Lymph nodes: No cervical or supraclavicular nodes, mobile 1 cm bilateral axillary nodes versus prominent fat pads  LABORATORY DATA:  I have reviewed the data as listed CMP Latest Ref Rng & Units 02/07/2019 02/06/2019 12/16/2018  Glucose 70 - 99 mg/dL 160(H) 88 124(H)  BUN 8 - 23 mg/dL '15 16 17  ' Creatinine 0.61 - 1.24 mg/dL 1.00 0.75 0.69  Sodium 135 - 145  mmol/L 137 138 137  Potassium 3.5 - 5.1 mmol/L 3.9 4.3 4.2  Chloride 98 - 111 mmol/L 102 103 103  CO2 22 - 32 mmol/L '23 26 26  ' Calcium 8.9 - 10.3 mg/dL 8.8(L) 9.3 8.9  Total Protein 6.5 - 8.1 g/dL - 7.0 -  Total Bilirubin 0.3 - 1.2 mg/dL - 0.6 -  Alkaline Phos 38 - 126 U/L - 108 -  AST 15 - 41 U/L - 21 -  ALT 0 - 44 U/L - 23 -    Lab Results  Component Value Date    WBC 14.7 (H) 02/07/2019   HGB 13.0 02/07/2019   HCT 41.5 02/07/2019   MCV 89.8 02/07/2019   PLT 271 02/07/2019   NEUTROABS 3.9 02/06/2019    Ct Soft Tissue Neck W Contrast  Result Date: 02/06/2019 CLINICAL DATA:  Esophagectomy. Esophagogastric ostomy. Dilatation of anastomosis today. EXAM: CT NECK WITH CONTRAST TECHNIQUE: Multidetector CT imaging of the neck was performed using the standard protocol following the bolus administration of intravenous contrast. CONTRAST:  84m OMNIPAQUE IOHEXOL 300 MG/ML SOLN, 748mOMNIPAQUE IOHEXOL 300 MG/ML SOLN COMPARISON:  CT neck 12/02/2018 FINDINGS: Pharynx and larynx: Normal pharynx. Epiglottis and larynx normal. Normal airway. Salivary glands: No inflammation, mass, or stone. Thyroid: Small thyroid nodules.  No mass lesion. Lymph nodes: No enlarged lymph nodes in the neck. Vascular: Normal vascular enhancement. Atherosclerotic calcification carotid bifurcation bilaterally. Limited intracranial: Negative Visualized orbits: Negative Mastoids and visualized paranasal sinuses: Bony thickening left maxillary sinus due to chronic inflammation. The remaining sinuses are clear. Skeleton: Ill-defined sclerotic lesions T2 and T4 have developed since 12/02/2018. Sclerotic lesion in the left second rib has progressed in the interval. Probable metastatic disease. No fracture. Upper chest: Extraluminal gas in the mediastinum. Small amount of gas in the right neck. Extraluminal contrast to the left of the anastomosis could be from prior extravasation or from today. The patient did receive a small amount of oral contrast. Findings compatible with mucosal tear from dilatation today. No fluid collection or abscess. Mild soft tissue thickening at the anastomosis could represent inflammation or tumor. Other: None IMPRESSION: 1. Negative for mass or adenopathy in the neck 2. Extraluminal gas in the mediastinum and right neck compatible with perforation following esophagogastroscopy  dilatation today. No free fluid or abscess. 3. Sclerotic lesions at T2, T4, and left second rib have progressed since the recent CT compatible with bony metastatic disease. Electronically Signed   By: ChFranchot Gallo.D.   On: 02/06/2019 17:18   Ct Chest W Contrast  Result Date: 02/06/2019 CLINICAL DATA:  Status post esophageal dilatation with history of adenocarcinoma of the gastroesophageal junction. Value 8 for esophageal leak. EXAM: CT CHEST WITH CONTRAST TECHNIQUE: Multidetector CT imaging of the chest was performed during intravenous contrast administration. CONTRAST:  3010mMNIPAQUE IOHEXOL 300 MG/ML SOLN, 42m24mNIPAQUE IOHEXOL 300 MG/ML SOLN COMPARISON:  None. FINDINGS: Cardiovascular: Heart size upper normal. No substantial pericardial effusion. Atherosclerotic calcification is noted in the wall of the thoracic aorta. Mediastinum/Nodes: There is evidence of pneumomediastinum with gas in the left paraesophageal space near the proximal anastomosis and tracking anteriorly and up into the neck and down towards the abdomen. As seen on the upper GI series from 3 days ago, there is an apparent tight stricture of the esophagus near the anastomosis just below the thoracic inlet. At the level of the stricture, there is a 2.8 x 2.2 cm ill-defined soft tissue lesion that appears to essentially circumferentially surround the narrowed lumen. Gas  and contrast material are identified in the extraluminal paraesophageal soft tissues in the region of the stricture(axial image 34/series 2). Gas and contrast material are seen in the mediastinum to the left of the proximal esophagus (axial 42/series 2 and coronal 70/series 6). No mediastinal lymphadenopathy. There is no hilar lymphadenopathy. There is no axillary lymphadenopathy. Lungs/Pleura: Dependent atelectasis/infiltrate is noted in the posterior lower lobes bilaterally Upper Abdomen: 9 mm hypoattenuating lesion in the dome of the liver (116/2) is stable since CT of  10/24/2018. A larger low-density lesion in the left liver seen on that study is not evident today. Thickening of the adrenal glands bilaterally, left greater than right is similar to prior. Prior imaging characterized these adrenal findings as adenomatous. Musculoskeletal: 10 mm sclerotic focus posterior T4 vertebral body (axial 45/2 and sagittal 86/7) not definitely seen on the prior study. There may be a subtle sclerotic lesion in the T2 vertebral body. Sclerosis in the posterior left second rib is similar to prior. IMPRESSION: 1. Pneumomediastinum is associated with extraluminal contrast to the left of the patient's known proximal esophageal stricture. Imaging features consistent with esophageal perforation, most likely at the level of the stricture. 2. Proximal esophageal stricture is in the region the anastomosis and involved by a 2.8 x 2.2 cm focus of ill-defined soft tissue. Recurrent disease a concern. 3. 10 mm subtle sclerotic focus in the posterior T4 vertebral body, not definitely seen previously and concerning for new bony metastatic disease. Potential new lesion at the T2 level. Close attention on follow-up recommended. Critical Value/emergent results were called by me at the time of interpretation on 02/06/2019 at 5:17 pm to Dr. [name] , who verbally acknowledged these results. Electronically Signed   By: Misty Stanley M.D.   On: 02/06/2019 17:18   Dg Chest Port 1 View  Result Date: 02/07/2019 CLINICAL DATA:  Esophageal perforation. EXAM: PORTABLE CHEST 1 VIEW COMPARISON:  CT scan of February 06, 2019. Radiographs of December 31, 2018. FINDINGS: Stable cardiac silhouette. Stable dilated proximal thoracic esophagus is noted. No pneumothorax is noted. Right lung is clear. Small left pleural effusion is noted with associated atelectasis or infiltrate. Bony thorax is unremarkable. IMPRESSION: Small left pleural effusion is noted which is increased compared to prior exam, with associated left basilar  atelectasis or infiltrate. Electronically Signed   By: Marijo Conception M.D.   On: 02/07/2019 05:42   Dg C-arm 1-60 Min  Result Date: 02/06/2019 CLINICAL DATA:  Esophageal dilatation under fluoroscopy. EXAM: DG C-ARM 61-120 MIN COMPARISON:  Esophagram 02/03/2019 FINDINGS: Single image from patient's esophageal dilatation under fluoroscopy is submitted for review. Endoscope with wire seen over the level of the cervical/proximal esophagus crossing region of known stricture at the esophagogastric anastomosis. Recommend correlation with findings at the time of the procedure. IMPRESSION: Dilatation of known stricture at the esophagogastric anastomosis as described. Electronically Signed   By: Marin Olp M.D.   On: 02/06/2019 13:37   Dg Esophagus W Single Cm (sol Or Thin Ba)  Result Date: 02/03/2019 CLINICAL DATA:  Status post complete esophagectomy with cervical esophagogastric anastomosis 42/87/6811, complicated by anastomotic leak. Patient now presents with worsening dysphagia with sensation of food and liquid sticking in the neck. EXAM: ESOPHOGRAM/BARIUM SWALLOW TECHNIQUE: Single contrast examination was performed using water-soluble contrast initially and thin barium. FLUOROSCOPY TIME:  Fluoroscopy Time:  2 minutes 6 seconds Radiation Exposure Index (if provided by the fluoroscopic device): 65 mGy Number of Acquired Spot Images: 13 COMPARISON:  12/16/2018 esophagram.  12/02/2018 CT  neck. FINDINGS: Initial frontal and bilateral oblique scout radiographs of the neck were obtained demonstrating no soft tissue emphysema. There is no residual leak identified at the esophagogastric anastomosis at the level of the thoracic inlet. There is a high-grade pinhole anastomotic stricture at the esophagogastric anastomosis at the level of the thoracic inlet, with a high velocity jet observed with water soluble and thin barium passage through the stricture site. No discrete mass. No evidence of laryngeal penetration or  tracheobronchial aspiration. No appreciable ulcer. Intrathoracic stomach is relatively decompressed with normal gastric emptying into the duodenum. IMPRESSION: 1. High-grade pinhole stricture at the esophagogastric anastomosis at the level of the thoracic inlet. No discrete mass or ulcer. 2. No residual leak at the esophagogastric anastomosis. These results will be called to the ordering clinician or representative by the Radiologist Assistant, and communication documented in the PACS or zVision Dashboard. Electronically Signed   By: Ilona Sorrel M.D.   On: 02/03/2019 09:03    ASSESSMENT AND PLAN: 1. 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 Upper endoscopy 07/25/2018-esophageal mass at 38-41 cm extending to the GE junction and gastric cardia, biopsy confirmed adenocarcinoma ? CTs 08/01/2018-wall thickening at the distal esophagus extending to the GE junction and gastric cardia, gastrohepatic ligament lymphadenopathy, perigastric lymph node, too small to characterize liver lesion, bilateral adrenal adenomas ? PET scan 08/09/2018-hypermetabolic mass spanning the gastroesophageal junction. Clustered gastrohepatic ligament lymph nodes likely involved with maximum SUV 3.2. ? Radiation 08/21/2018-09/27/2018  ? Cycle 1 weekly Taxol/carboplatin 08/21/2018 ? Cycle 2 weekly Taxol/carboplatin 08/27/2018 ? Cycle 3 weekly Taxol/carboplatin 09/03/2018 ? Cycle 4 weekly Taxol/carboplatin 09/10/2018 ? Cycle 5 weekly Taxol/carboplatin 09/17/2018 ? CTs 10/24/2018-gastric cardia portion of the gastroesophageal mass less striking than on the 08/09/2018 exam. Adjacent gastrohepatic ligament adenopathy is still present with clustered nodes measuring 1.2 cm. Aortocaval node measures 0.8 cm, formerly the same, not formally hypermetabolic. ? Esophagogastrectomy 11/25/2018-moderate to poorly differentiated adenocarcinoma measuring at least 20 cm involving the distal esophagus, GE junction, and proximal stomach.  Tumor midpoint and the proximal stomach. Tumor invades the visceral peritoneum. Proximal and distal resection margins are positive, 5/13 lymph nodes positive, lymphovascular and perineural invasion present, absent treatment effect, pT4a,pN2; foundation 1-microsatellite stable, tumor mutational burden 1, BRCA2; PDL 1 combined positive score 1; HER-2/neu negative by IHC  2. Iron deficiency anemia secondary to #1,improved 3. Colon polyps, tubular adenomas, identified on colonoscopy 07/25/2018 4. Hypertension 5. Hyperlipidemia 6. Tobacco use 7. 02/06/2019 -hospital admission for pneumomediastinum  [name] [name] is now admitted for pneumomediastinum following upper endoscopy.  He was seen by cardiothoracic surgery who does not recommend surgical repair.  CT scan after the procedure demonstrated disease progression.  He is currently receiving IV antibiotics.  1.  CT scan findings discussed with the patient.  Pathology from esophageal mass pending.  The patient is aware that he likely has disease progression. 2.  Discussed prior recommendations for proceeding with chemotherapy such as FOLFOX as previously recommended at [facility].  The patient remains unsure if he wishes to proceed with any additional chemotherapy. 3.  Continue IV antibiotics per primary team.   LOS: 0 days   Mikey Bussing, DNP, AGPCNP-BC, AOCNP 02/07/19 [name] [name] was interviewed and examined.  I reviewed the CT images.  He developed dysphasia last week and was diagnosed with a stricture at the esophagogastric anastomosis on a barium swallow examination.  He is now admitted after developing a esophageal perforation following a dilatation procedure.  There is CT and endoscopic evidence of local tumor  progression.  An esophagus biopsy is pending. There are tiny sclerotic foci in the thoracic spine on chest CT which may represent metastases.  I discussed the CT findings and likelihood of recurrent esophagus cancer with [name] [name] and his  wife.  He is not a candidate for further systemic therapy at present.  He is being evaluated for a germline BRCA mutation.  We will consider systemic therapy if the esophageal perforation heals and he is confirmed to have disease progression.  Please call oncology as needed.  I will check on him 02/10/2019.

## 2019-02-07 NOTE — Progress Notes (Signed)
I followed up in person with the patient today.  His wife was present.  He is comfortable. No more fevers. After consulting with pharmacy I have him on Zosyn and vancomycin, the vancomycin is not something we necessarily need to leave on board long-term but I would over the weekend and we can regroup.   He is now having urinary leakage.  I do not think that has anything to do with the thoracic spine metastases but it is a new problem he has developed and hospitalist is evaluating.   I explained to the patient and his wife that I think his problems are resulting from persistent growth of his cancer, it looks metastatic to T4 and to T2 now as well.  I am available over the weekend but anticipate rounding on him again on Monday, appreciate hospitalist help in his care.   Depending upon clinical course I think we can consider Gastrografin swallow Monday or Tuesday.  I did explain that there is potential for stenting though review of the films again with my colleague suggest this would be difficult so I am less inclined to think that is possible as I write this note.  I will communicate with Dr. Benay Spice his oncologist about the recent developments as well.  Per the patient he was seen for second opinion at [facility] about other potential chemotherapy since he did not respond well to his initial course, and the thought was to observe things as there were no ongoing studies or known chemotherapeutic agents thought worth trying at that time.  I anticipate he will eventually need palliative radiotherapy to his spine, fortunately he does not seem symptomatic and less this new urinary issue was related but I cannot put that together with T2 and T4 mets.   Gatha Mayer, MD, [facility] 02/07/2019 1:22 PM Pager [phone] Cell [phone]

## 2019-02-07 NOTE — Progress Notes (Signed)
PHARMACY NOTE:  ANTIMICROBIAL RENAL DOSAGE ADJUSTMENT  Current antimicrobial regimen includes a mismatch between antimicrobial dosage and estimated renal function.  As per policy approved by the Pharmacy & Therapeutics and Medical Executive Committees, the antimicrobial dosage will be adjusted accordingly.  Current antimicrobial dosage:  Vancomycin 750mg  IV q8h  Indication: esophageal perf  Renal Function:  Estimated Creatinine Clearance: 65.1 mL/min (by C-G formula based on SCr of 1 mg/dL). []      On intermittent HD, scheduled: []      On CRRT    Antimicrobial dosage has been changed to:  750mg  IV q12h (AUC531.8, Scr 1)  Additional comments: Discussed the actual need for vanc and MD wants to keep on thru the weekend  Thank you for allowing pharmacy to be a part of this patient's care.  Dolly Rias RPh 02/07/2019, 1:54 PM Pager [phone]

## 2019-02-07 NOTE — Progress Notes (Signed)
Initial Nutrition Assessment  RD working remotely.   DOCUMENTATION CODES:   (unable to assess for malnutrition at this time.)  INTERVENTION: - will order Osmolite 1.5 @ 60 ml/hr with 30 ml prostat BID.  - will order free water flush of 100 ml QID.  NUTRITION DIAGNOSIS:   Increased nutrient needs related to chronic illness, cancer and cancer related treatments as evidenced by estimated needs.  GOAL:   Patient will meet greater than or equal to 90% of their needs  MONITOR:   TF tolerance, Labs, Weight trends  REASON FOR ASSESSMENT:   Consult Enteral/tube feeding initiation and management  ASSESSMENT:   68 y.o. male with medical history significant for esophageal cancer s/p esophagogastrectomy with severe dysphagia and very tight stricture on barium swallow who presented to endoscopy at [facility] for evaluation and dilation. Patient uses feeding jejunostomy for nutrition. Upper GI endoscopy showed severe stenosis at the esophageal anastomosis and nodule in the esophagus. Dilation performed and esophagus was biopsied. CT neck and chest done to further evaluate post dilation. TRH asked to admit for observation.  Patient has been NPO since admission. Current weight is 148 lb and weight on 7/28 was 150 lb. Patient has a J-tube and is receiving Jevity 1.2 @ 50 ml/hr which provides 1440 kcal, 67 grams protein, and 973 ml free water. Will adjust TF regimen as outlined above.   Patient was assessed in person by another RD during hospitalization in June. At that time, patient was receiving Osmolite 1.5 @ 60 ml/hr with 30 ml prostat BID via J-tube. This regimen provides 2360 kcal, 120 grams protein, and 1097 ml free water. At that time he also was identified as meeting criteria for non-severe (moderate) malnutrition in the context of chronic disease (cancer and cancer treatments) as evidenced by mild muscle depletion and energy intake <75% for >/= 1 month.   Per notes: - s/p EGD on 8/20 -  suspected cancer mets to bones - FTT in adult   Labs reviewed; CBGs: 152, 145, and 143 mg/dl today.  Medications reviewed.     NUTRITION - FOCUSED PHYSICAL EXAM:  unable to complete at this time.   Diet Order:   Diet Order            Diet NPO time specified  Diet effective now              EDUCATION NEEDS:   No education needs have been identified at this time  Skin:  Skin Assessment: Reviewed RN Assessment  Last BM:  8/18 (PTA)  Height:   Ht Readings from Last 1 Encounters:  02/06/19 6' (1.829 m)    Weight:   Wt Readings from Last 1 Encounters:  02/07/19 67 kg    Ideal Body Weight:  80.9 kg  BMI:  Body mass index is 20.03 kg/m.  Estimated Nutritional Needs:   Kcal:  2200-2400 kcal  Protein:  115-125 grams  Fluid:  >/= 2.2 L/day     Jarome Matin, MS, RD, LDN, [facility] Inpatient Clinical Dietitian Pager # [phone] After hours/weekend pager # [phone]

## 2019-02-07 NOTE — Progress Notes (Addendum)
    Fever overnight.  I discussed case with Dr. Jobie Quaker last night also.  I will see patient later today.  I added clindamycin and vancomycin to regimen to broaden coverage.  Recs:  Convert to inpatient admit If fevers persist change abx - ID can be asked if needed Consider gastrograffen swallow early next week to see if there could be a way to stent/palliate the stricture. Proximal location will make this difficult to tolerate. In my experience such rapid increase in dysphagia sx as he had suggests cancer as cause - await nodule bx - hopefully out today   Review of images looks to me like he has metastatic disease in T spine contiguous with mass that was extrinsic to esophageal anastomosis and causing the stricture.  I will round on him later  Contact me if any ?  Gatha Mayer, MD, [facility] 02/07/2019 7:15 AM Pager [phone] Cell [phone] (text first)  Spoke to pharmacy thought Zosyn was good coverage - will drop clindamycin but have decided to keep vancomycin right now

## 2019-02-07 NOTE — Progress Notes (Signed)
RN removed dressing around J-tube due to leakage. Area surrounding cleaned with water and prescribed cream applied.  RN applied Allevyn foams as WON instructed.

## 2019-02-07 NOTE — Progress Notes (Signed)
Marland [facility]  PROGRESS NOTE    [name] [name]  [id number] DOB: 20-Aug-1950 DOA: 02/06/2019 PCP: Binnie Rail, MD   Brief Narrative:   [name] [name] is a 68 y.o. male with medical history significant for esophageal cancer status post esophagogastrectomy with severe dysphagia and very tight stricture on barium swallow who presented to endoscopy at [facility] for evaluation and dilation.  Patient uses feeding jejunostomy for nutrition.  POD #0 post upper GI endoscopy which showed severe stenosis at the esophageal anastomosis and nodule in the esophagus.  Dilation performed and esophagus was biopsied.  CT neck and chest done to further evaluate post dilation. TRH asked to admit for observation.   Assessment & Plan:   Active Problems:   Esophageal stricture   Primary adenocarcinoma of esophagogastric junction (HCC)   Stricture esophagus   Esophageal cancer status post esophagogastrectomy with severe dysphagia and very tight stricture on barium swallow       - s/p post upper GI endoscopy with dilation     - Nodule found in the esophagus, was biopsied.         - CT neck and chest done to further evaluate post dilation     - N.p.o.     - On IV Zosyn empirically per GIs recommendation     - continue IV fluid hydration D5 LR at 75 cc/h     - J-tube feeding resumed     - GI/heme-onc onboard, appreciate assistance  Pneumomediastinum/extravasation of contrast from esophagus     - Possibly secondary to perforation from dilation     - continuous pulse oximetry     - CTS onboard, appreciate assistance; not currently recommending surgical repair  Suspected cancer mets to bones     - Defer to oncology     - Optimize pain control  Severe dysphagia post J-tube placement     - NPO     - J-tube feeds started  Failure to thrive in an adult     - Continue feeding supplement through J-tube  Chronic lower back pain     - Due to n.p.o. we will hold off home oxycodone     - continue IV  morphine 1 mg every 4 hours as needed for moderate pain; IV Dilaudid 0.5 mg every 6 hours as needed for severe pain  DVT prophylaxis: SCDs Code Status: FULL Family Communication: None at bedside   Disposition Plan: TBD  Consultants:   CTS  GI  Heme-Onc  Antimicrobials:   Zosyn   ROS:  Denies CP, dyspnea, N, V. Remainder 10-pt ROS is negative for all not previously mentioned.  Subjective: "I was in good health... all my organs were ok before this."  Objective: Vitals:   02/06/19 2322 02/07/19 0041 02/07/19 0431 02/07/19 1201  BP: 106/64  (!) 108/59 (!) 104/59  Pulse: 92  67 67  Resp: (!) 22 18 18 20   Temp: (!) 100.4 F (38 C) 98.7 F (37.1 C) 98.2 F (36.8 C) 98.8 F (37.1 C)  TempSrc: Oral Oral Oral Oral  SpO2: 95%  97% 97%  Weight:   67 kg   Height:        Intake/Output Summary (Last 24 hours) at 02/07/2019 1601 Last data filed at 02/07/2019 0200 Gross per 24 hour  Intake 530.98 ml  Output --  Net 530.98 ml   Filed Weights   02/06/19 1046 02/07/19 0431  Weight: 66.2 kg 67 kg    Examination:  General: 68 y.o. male resting  in bed in NAD Eyes: PERRL, normal sclera ENMT: Nares patent w/o discharge, orophaynx clear, dentition normal, ears w/o discharge/lesions/ulcers Cardiovascular: RRR, +S1, S2, no m/g/r, equal pulses throughout Respiratory: CTABL, no w/r/r, normal WOB GI: BS+, NDNT, no masses noted, no organomegaly noted, j-tube noted MSK: No e/c/c Skin: No rashes, bruises, ulcerations noted Neuro: A&O x 3, no focal deficits Psyc: Appropriate interaction and affect, calm/cooperative   Data Reviewed: I have personally reviewed following labs and imaging studies.  CBC: Recent Labs  Lab 02/06/19 1505 02/07/19 0347  WBC 5.8 14.7*  NEUTROABS 3.9  --   HGB 13.1 13.0  HCT 42.2 41.5  MCV 90.8 89.8  PLT 266 [id number]   Basic Metabolic Panel: Recent Labs  Lab 02/06/19 1505 02/07/19 0347  NA 138 137  K 4.3 3.9  CL 103 102  CO2 26 23  GLUCOSE 88  160*  BUN 16 15  CREATININE 0.75 1.00  CALCIUM 9.3 8.8*  MG 2.3  --   PHOS 4.7*  --    GFR: Estimated Creatinine Clearance: 65.1 mL/min (by C-G formula based on SCr of 1 mg/dL). Liver Function Tests: Recent Labs  Lab 02/06/19 1505  AST 21  ALT 23  ALKPHOS 108  BILITOT 0.6  PROT 7.0  ALBUMIN 3.8   No results for input(s): LIPASE, AMYLASE in the last 168 hours. No results for input(s): AMMONIA in the last 168 hours. Coagulation Profile: No results for input(s): INR, PROTIME in the last 168 hours. Cardiac Enzymes: No results for input(s): CKTOTAL, CKMB, CKMBINDEX, TROPONINI in the last 168 hours. BNP (last 3 results) No results for input(s): PROBNP in the last 8760 hours. HbA1C: No results for input(s): HGBA1C in the last 72 hours. CBG: Recent Labs  Lab 02/06/19 2146 02/06/19 2323 02/07/19 0433 02/07/19 0730 02/07/19 1157  GLUCAP 104* 127* 152* 145* 143*   Lipid Profile: No results for input(s): CHOL, HDL, LDLCALC, TRIG, CHOLHDL, LDLDIRECT in the last 72 hours. Thyroid Function Tests: No results for input(s): TSH, T4TOTAL, FREET4, T3FREE, THYROIDAB in the last 72 hours. Anemia Panel: No results for input(s): VITAMINB12, FOLATE, FERRITIN, TIBC, IRON, RETICCTPCT in the last 72 hours. Sepsis Labs: No results for input(s): PROCALCITON, LATICACIDVEN in the last 168 hours.  Recent Results (from the past 240 hour(s))  SARS CORONAVIRUS 2 Nasal Swab Aptima Multi Swab     Status: None   Collection Time: 02/04/19 11:03 AM   Specimen: Aptima Multi Swab; Nasal Swab  Result Value Ref Range Status   SARS Coronavirus 2 NEGATIVE NEGATIVE Final    Comment: (NOTE) SARS-CoV-2 target nucleic acids are NOT DETECTED. The SARS-CoV-2 RNA is generally detectable in upper and lower respiratory specimens during the acute phase of infection. Negative results do not preclude SARS-CoV-2 infection, do not rule out co-infections with other pathogens, and should not be used as the sole basis  for treatment or other patient management decisions. Negative results must be combined with clinical observations, patient history, and epidemiological information. The expected result is Negative. Fact Sheet for Patients: SugarRoll.be Fact Sheet for Healthcare Providers: https://www.woods-mathews.com/ This test is not yet approved or cleared by the [place] FDA and  has been authorized for detection and/or diagnosis of SARS-CoV-2 by FDA under an Emergency Use Authorization (EUA). This EUA will remain  in effect (meaning this test can be used) for the duration of the COVID-19 declaration under Section 56 4(b)(1) of the Act, 21 U.S.C. section 360bbb-3(b)(1), unless the authorization is terminated or revoked sooner. Performed at [facility]  [facility], [place] [address], [place], [place] [address]   Surgical PCR screen     Status: None   Collection Time: 02/07/19  1:18 PM   Specimen: Nasal Mucosa; Nasal Swab  Result Value Ref Range Status   MRSA, PCR NEGATIVE NEGATIVE Final   Staphylococcus aureus NEGATIVE NEGATIVE Final    Comment: (NOTE) The Xpert SA Assay (FDA approved for NASAL specimens in patients 34 years of age and older), is one component of a comprehensive surveillance program. It is not intended to diagnose infection nor to guide or monitor treatment. Performed at [facility], [place] [address], [place], [place] [address]       Radiology Studies: Ct Soft Tissue Neck W Contrast  Result Date: 02/06/2019 CLINICAL DATA:  Esophagectomy. Esophagogastric ostomy. Dilatation of anastomosis today. EXAM: CT NECK WITH CONTRAST TECHNIQUE: Multidetector CT imaging of the neck was performed using the standard protocol following the bolus administration of intravenous contrast. CONTRAST:  9mL OMNIPAQUE IOHEXOL 300 MG/ML SOLN, 41mL OMNIPAQUE IOHEXOL 300 MG/ML SOLN COMPARISON:  CT neck 12/02/2018 FINDINGS: Pharynx and larynx:  Normal pharynx. Epiglottis and larynx normal. Normal airway. Salivary glands: No inflammation, mass, or stone. Thyroid: Small thyroid nodules.  No mass lesion. Lymph nodes: No enlarged lymph nodes in the neck. Vascular: Normal vascular enhancement. Atherosclerotic calcification carotid bifurcation bilaterally. Limited intracranial: Negative Visualized orbits: Negative Mastoids and visualized paranasal sinuses: Bony thickening left maxillary sinus due to chronic inflammation. The remaining sinuses are clear. Skeleton: Ill-defined sclerotic lesions T2 and T4 have developed since 12/02/2018. Sclerotic lesion in the left second rib has progressed in the interval. Probable metastatic disease. No fracture. Upper chest: Extraluminal gas in the mediastinum. Small amount of gas in the right neck. Extraluminal contrast to the left of the anastomosis could be from prior extravasation or from today. The patient did receive a small amount of oral contrast. Findings compatible with mucosal tear from dilatation today. No fluid collection or abscess. Mild soft tissue thickening at the anastomosis could represent inflammation or tumor. Other: None IMPRESSION: 1. Negative for mass or adenopathy in the neck 2. Extraluminal gas in the mediastinum and right neck compatible with perforation following esophagogastroscopy dilatation today. No free fluid or abscess. 3. Sclerotic lesions at T2, T4, and left second rib have progressed since the recent CT compatible with bony metastatic disease. Electronically Signed   By: Franchot Gallo M.D.   On: 02/06/2019 17:18   Ct Chest W Contrast  Result Date: 02/06/2019 CLINICAL DATA:  Status post esophageal dilatation with history of adenocarcinoma of the gastroesophageal junction. Value 8 for esophageal leak. EXAM: CT CHEST WITH CONTRAST TECHNIQUE: Multidetector CT imaging of the chest was performed during intravenous contrast administration. CONTRAST:  62mL OMNIPAQUE IOHEXOL 300 MG/ML SOLN, 33mL  OMNIPAQUE IOHEXOL 300 MG/ML SOLN COMPARISON:  None. FINDINGS: Cardiovascular: Heart size upper normal. No substantial pericardial effusion. Atherosclerotic calcification is noted in the wall of the thoracic aorta. Mediastinum/Nodes: There is evidence of pneumomediastinum with gas in the left paraesophageal space near the proximal anastomosis and tracking anteriorly and up into the neck and down towards the abdomen. As seen on the upper GI series from 3 days ago, there is an apparent tight stricture of the esophagus near the anastomosis just below the thoracic inlet. At the level of the stricture, there is a 2.8 x 2.2 cm ill-defined soft tissue lesion that appears to essentially circumferentially surround the narrowed lumen. Gas and contrast material are identified in the extraluminal paraesophageal soft tissues  in the region of the stricture(axial image 34/series 2). Gas and contrast material are seen in the mediastinum to the left of the proximal esophagus (axial 42/series 2 and coronal 70/series 6). No mediastinal lymphadenopathy. There is no hilar lymphadenopathy. There is no axillary lymphadenopathy. Lungs/Pleura: Dependent atelectasis/infiltrate is noted in the posterior lower lobes bilaterally Upper Abdomen: 9 mm hypoattenuating lesion in the dome of the liver (116/2) is stable since CT of 10/24/2018. A larger low-density lesion in the left liver seen on that study is not evident today. Thickening of the adrenal glands bilaterally, left greater than right is similar to prior. Prior imaging characterized these adrenal findings as adenomatous. Musculoskeletal: 10 mm sclerotic focus posterior T4 vertebral body (axial 45/2 and sagittal 86/7) not definitely seen on the prior study. There may be a subtle sclerotic lesion in the T2 vertebral body. Sclerosis in the posterior left second rib is similar to prior. IMPRESSION: 1. Pneumomediastinum is associated with extraluminal contrast to the left of the patient's known  proximal esophageal stricture. Imaging features consistent with esophageal perforation, most likely at the level of the stricture. 2. Proximal esophageal stricture is in the region the anastomosis and involved by a 2.8 x 2.2 cm focus of ill-defined soft tissue. Recurrent disease a concern. 3. 10 mm subtle sclerotic focus in the posterior T4 vertebral body, not definitely seen previously and concerning for new bony metastatic disease. Potential new lesion at the T2 level. Close attention on follow-up recommended. Critical Value/emergent results were called by me at the time of interpretation on 02/06/2019 at 5:17 pm to Dr. [name] , who verbally acknowledged these results. Electronically Signed   By: Misty Stanley M.D.   On: 02/06/2019 17:18   Dg Chest Port 1 View  Result Date: 02/07/2019 CLINICAL DATA:  Esophageal perforation. EXAM: PORTABLE CHEST 1 VIEW COMPARISON:  CT scan of February 06, 2019. Radiographs of December 31, 2018. FINDINGS: Stable cardiac silhouette. Stable dilated proximal thoracic esophagus is noted. No pneumothorax is noted. Right lung is clear. Small left pleural effusion is noted with associated atelectasis or infiltrate. Bony thorax is unremarkable. IMPRESSION: Small left pleural effusion is noted which is increased compared to prior exam, with associated left basilar atelectasis or infiltrate. Electronically Signed   By: Marijo Conception M.D.   On: 02/07/2019 05:42   Dg C-arm 1-60 Min  Result Date: 02/06/2019 CLINICAL DATA:  Esophageal dilatation under fluoroscopy. EXAM: DG C-ARM 61-120 MIN COMPARISON:  Esophagram 02/03/2019 FINDINGS: Single image from patient's esophageal dilatation under fluoroscopy is submitted for review. Endoscope with wire seen over the level of the cervical/proximal esophagus crossing region of known stricture at the esophagogastric anastomosis. Recommend correlation with findings at the time of the procedure. IMPRESSION: Dilatation of known stricture at the  esophagogastric anastomosis as described. Electronically Signed   By: Marin Olp M.D.   On: 02/06/2019 13:37     Scheduled Meds:  [name]'s butt cream   Topical BID   mupirocin ointment  1 application Nasal BID   Continuous Infusions:  feeding supplement (JEVITY 1.2 CAL) 40 mL/hr at 02/07/19 0430   piperacillin-tazobactam (ZOSYN)  IV 3.375 g (02/07/19 0607)   vancomycin 750 mg (02/07/19 1145)     LOS: 0 days    Time spent: 25 minutes spent in the coordination of care today.   Jonnie Finner, DO [facility] Pager [phone]  If 7PM-7AM, please contact night-coverage [email or web address] Password [facility] 02/07/2019, 4:01 PM

## 2019-02-08 DIAGNOSIS — R52 Pain, unspecified: Secondary | ICD-10-CM

## 2019-02-08 DIAGNOSIS — Z7189 Other specified counseling: Secondary | ICD-10-CM

## 2019-02-08 DIAGNOSIS — Z515 Encounter for palliative care: Secondary | ICD-10-CM

## 2019-02-08 LAB — CBC WITH DIFFERENTIAL/PLATELET
Abs Immature Granulocytes: 0.04 10*3/uL (ref 0.00–0.07)
Basophils Absolute: 0 10*3/uL (ref 0.0–0.1)
Basophils Relative: 0 %
Eosinophils Absolute: 0.1 10*3/uL (ref 0.0–0.5)
Eosinophils Relative: 1 %
HCT: 34.5 % — ABNORMAL LOW (ref 39.0–52.0)
Hemoglobin: 10.8 g/dL — ABNORMAL LOW (ref 13.0–17.0)
Immature Granulocytes: 0 %
Lymphocytes Relative: 7 %
Lymphs Abs: 0.6 10*3/uL — ABNORMAL LOW (ref 0.7–4.0)
MCH: 28.4 pg (ref 26.0–34.0)
MCHC: 31.3 g/dL (ref 30.0–36.0)
MCV: 90.8 fL (ref 80.0–100.0)
Monocytes Absolute: 0.7 10*3/uL (ref 0.1–1.0)
Monocytes Relative: 7 %
Neutro Abs: 7.9 10*3/uL — ABNORMAL HIGH (ref 1.7–7.7)
Neutrophils Relative %: 85 %
Platelets: 195 10*3/uL (ref 150–400)
RBC: 3.8 MIL/uL — ABNORMAL LOW (ref 4.22–5.81)
RDW: 13.4 % (ref 11.5–15.5)
WBC: 9.3 10*3/uL (ref 4.0–10.5)
nRBC: 0 % (ref 0.0–0.2)

## 2019-02-08 LAB — GLUCOSE, CAPILLARY
Glucose-Capillary: 137 mg/dL — ABNORMAL HIGH (ref 70–99)
Glucose-Capillary: 136 mg/dL — ABNORMAL HIGH (ref 70–99)
Glucose-Capillary: 137 mg/dL — ABNORMAL HIGH (ref 70–99)
Glucose-Capillary: 91 mg/dL (ref 70–99)
Glucose-Capillary: 131 mg/dL — ABNORMAL HIGH (ref 70–99)

## 2019-02-08 LAB — RENAL FUNCTION PANEL
Albumin: 2.7 g/dL — ABNORMAL LOW (ref 3.5–5.0)
Anion gap: 7 (ref 5–15)
BUN: 14 mg/dL (ref 8–23)
CO2: 25 mmol/L (ref 22–32)
Calcium: 8.4 mg/dL — ABNORMAL LOW (ref 8.9–10.3)
Chloride: 106 mmol/L (ref 98–111)
Creatinine, Ser: 0.69 mg/dL (ref 0.61–1.24)
GFR calc Af Amer: >60 mL/min (ref >60)
GFR calc non Af Amer: >60 mL/min (ref >60)
Glucose, Bld: 129 mg/dL — ABNORMAL HIGH (ref 70–99)
Phosphorus: 3.1 mg/dL (ref 2.5–4.6)
Potassium: 3.7 mmol/L (ref 3.5–5.1)
Sodium: 138 mmol/L (ref 135–145)

## 2019-02-08 LAB — MAGNESIUM: Magnesium: 2.3 mg/dL (ref 1.7–2.4)

## 2019-02-08 NOTE — Consult Note (Signed)
Consultation Note Date: 02/08/2019   Patient Name: [name] [name]  DOB: 12-08-1950  MRN: [id number]  Age / Sex: 68 y.o., male  PCP: Binnie Rail, MD Referring Physician: Jonnie Finner, DO  Reason for Consultation: Establishing goals of care  HPI/Patient Profile: 68 y.o. male  with past medical history of esophageal cancer admitted on 02/06/2019 with severe dysphagia, very tight stricture noted initially on barium swallow, presented to endoscopy for evaluation and dilation.   Clinical Assessment and Goals of Care: Patient has a jejunostomy feeding tube for nutrition.  Patient was admitted after developing possible esophageal perforation, recent dilation procedure.  CT scan evidence of local tumor progression.  Biopsies ordered.  Also noted to have possible tiny sclerotic foci likely representative of metastatic disease in the thoracic spine.  Oncology is following.  Gastroenterology is following.  Palliative consult for additional supportive care has been requested.  Patient is awake alert resting in bed.  His wife is present at the bedside giving him a sponge bath.  Discussed with the patient that nature of visit was for supportive care, symptom management and to make sure that the patient was doing as well as he could given his current circumstances.  Palliative medicine is specialized medical care for people living with serious illness. It focuses on providing relief from the symptoms and stress of a serious illness. The goal is to improve quality of life for both the patient and the family.  Goals of care: Broad aims of medical therapy in relation to the patient's values and preferences. Our aim is to provide medical care aimed at enabling patients to achieve the goals that matter most to them, given the circumstances of their particular medical situation and their constraints.   Patient states that he  does not take a whole lot of pain medicines.  Here he is getting IV morphine as needed back pain.  He has chronic back pain.  He did have a bowel movement yesterday evening.  He denies urinary incontinence or fecal incontinence.  He is in no acute distress.  Pleasant awake alert oriented and is able to answer all aspects of history taking.  Patient is eager for discharge once additional radiology studies are completed.  NEXT OF KIN Wife who is currently present at the bedside.  SUMMARY OF RECOMMENDATIONS   Full code/full scope. Continue current mode of care Continue IV morphine on an as-needed basis.  Patient is relatively opioid nave, takes oxycodone as needed at home only on an as-needed basis for chronic back pain. Patient is eager for repeat radiograph studies possibly to be done on Monday and then for him to be discharged home. Thank you for the consult.  Code Status/Advance Care Planning:  Full code    Symptom Management:   As above  Palliative Prophylaxis:   Delirium Protocol  Additional Recommendations (Limitations, Scope, Preferences):  Full Scope Treatment  Psycho-social/Spiritual:   Desire for further Chaplaincy support:yes  Additional Recommendations: Caregiving  Support/Resources  Prognosis:   Unable to determine  Discharge Planning: To Be Determined      Primary Diagnoses: Present on Admission: **None**   I have reviewed the medical record, interviewed the patient and family, and examined the patient. The following aspects are pertinent.  Past Medical History:  Diagnosis Date  . Adenocarcinoma of gastroesophageal junction ([place]) 07/29/2018  . Anemia   . Diverticulosis   . Hyperlipidemia   . Hypertension   . Pre-diabetes    but patient's wife states he has never been told that  . Tobacco abuse    Social History   Socioeconomic History  . Marital status: Married    Spouse name: Adele  . Number of children: 0  . Years of education: Not on  file  . Highest education level: Not on file  Occupational History  . Occupation: retired  Scientific laboratory technician  . Financial resource strain: Not on file  . Food insecurity    Worry: Not on file    Inability: Not on file  . Transportation needs    Medical: Not on file    Non-medical: Not on file  Tobacco Use  . Smoking status: Former Smoker    Packs/day: 0.25    Years: 50.00    Pack years: 12.50    Types: Cigarettes    Quit date: 06/19/2018    Years since quitting: 0.6  . Smokeless tobacco: Never Used  . Tobacco comment: 06/02/14 2-3 pp week  Substance and Sexual Activity  . Alcohol use: Yes    Alcohol/week: 2.0 standard drinks    Types: 2 Glasses of wine per week    Comment:  occasionally- None for last 6 months  . Drug use: No  . Sexual activity: Not Currently  Lifestyle  . Physical activity    Days per week: Not on file    Minutes per session: Not on file  . Stress: Not on file  Relationships  . Social Herbalist on phone: Not on file    Gets together: Not on file    Attends religious service: Not on file    Active member of club or organization: Not on file    Attends meetings of clubs or organizations: Not on file    Relationship status: Not on file  Other Topics Concern  . Not on file  Social History Narrative   Retired   Psychologist, counselling for exercise.   Working for [employer] [phone]). Enjoys volunteering.    Former smoker, rare EtOH   Family History  Problem Relation Age of Onset  . Cancer Mother        bone cancer  . Heart attack Father 59  . Cancer Sister        uterine cancer  . Heart attack Paternal Uncle        X2, both < 55  . COPD Neg Hx   . Diabetes Neg Hx   . Stroke Neg Hx   . Colon cancer Neg Hx    Scheduled Meds: . feeding supplement (PRO-STAT SUGAR FREE 64)  30 mL Per Tube BID  . free water  100 mL Per Tube QID  . [name]'s butt cream   Topical BID  . mupirocin ointment  1 application Nasal BID   Continuous Infusions: .  feeding supplement (OSMOLITE 1.5 CAL) 1,000 mL (02/08/19 0010)  . piperacillin-tazobactam (ZOSYN)  IV 3.375 g (02/08/19 0600)  . vancomycin 750 mg (02/08/19 1030)   PRN Meds:.acetaminophen (TYLENOL) oral liquid 160 mg/5 mL, HYDROmorphone (DILAUDID) injection, morphine  injection, ondansetron (ZOFRAN) IV Medications Prior to Admission:  Prior to Admission medications   Medication Sig Start Date End Date Taking? Authorizing Provider  Hydrocortisone ([name]'S BUTT CREAM) CREA Apply 1 application topically 2 (two) times daily as needed for irritation (apply around g tube). 01/14/19  Yes [name], MD  Nutritional Supplements (FEEDING SUPPLEMENT, OSMOLITE 1.5 CAL,) LIQD Place 237 mLs into feeding tube 4 (four) times daily.   Yes [provider]  oxyCODONE (ROXICODONE) 5 MG/5ML solution Take 5 mg by mouth every 6 (six) hours as needed for severe pain.   Yes [provider]  acetaminophen (TYLENOL) 160 MG/5ML solution Take 10.2 mLs (325 mg total) by mouth every 4 (four) hours as needed for moderate pain. Patient not taking: Reported on 02/05/2019 12/18/18   [name], PA-C  Amino Acids-Protein Hydrolys (FEEDING SUPPLEMENT, PRO-STAT SUGAR FREE 64,) LIQD Place 30 mLs into feeding tube 2 (two) times daily. Patient not taking: Reported on 02/05/2019 12/18/18   [name], PA-C  mupirocin cream (BACTROBAN) 2 % Apply topically daily. Please apply to skin break down near J-tube Patient not taking: Reported on 02/05/2019 12/18/18   [name], PA-C   No Known Allergies Review of Systems Patient complains of chronic back pain No chest pain No nausea No shortness of breath  Physical Exam Alert awake no acute distress resting in bed wife present at bedside S1-S2 Lungs clear to auscultation Patient has a J-tube, tube feedings going on, abdomen is not distended No edema Awake alert oriented no focal deficits  Vital Signs: BP 116/62 (BP Location: Right Arm)   Pulse (!) 58    Temp 99.2 F (37.3 C) (Oral)   Resp 18   Ht 6' (1.829 m)   Wt 67.7 kg   SpO2 97%   BMI 20.25 kg/m  Pain Scale: 0-10   Pain Score: 6    SpO2: SpO2: 97 % O2 Device:SpO2: 97 % O2 Flow Rate: .O2 Flow Rate (L/min): 2 L/min  IO: Intake/output summary:   Intake/Output Summary (Last 24 hours) at 02/08/2019 1326 Last data filed at 02/08/2019 1033 Gross per 24 hour  Intake 2727.06 ml  Output 175 ml  Net 2552.06 ml    LBM: Last BM Date: 02/07/19 Baseline Weight: Weight: 66.2 kg Most recent weight: Weight: 67.7 kg     Palliative Assessment/Data:   PPS 50%  Time In:  10 Time Out:  11 Time Total:  60 Greater than 50%  of this time was spent counseling and coordinating care related to the above assessment and plan.  Signed by: Loistine Chance, MD  [id number] Please contact Palliative Medicine Team phone at [phone] for questions and concerns.  For individual provider: See Shea Evans

## 2019-02-08 NOTE — Progress Notes (Signed)
Marland Kitchen  PROGRESS NOTE    [name] [name]  [id number] DOB: July 04, 1950 DOA: 02/06/2019 PCP: Binnie Rail, MD   Brief Narrative:   Tenny Craw Filipis a 68 y.o.malewith medical history significant foresophageal cancer status post esophagogastrectomy with severe dysphagia and very tight stricture on barium swallow who presented to endoscopy [facility] for evaluation and dilation. Patient uses feeding jejunostomy for nutrition. POD #0 post upper GI endoscopy which showed severe stenosis at the esophageal anastomosis and nodule in the esophagus. Dilation performed and esophagus was biopsied. CT neck and chest done to further evaluate post dilation. TRH askedto admit for observation.   Assessment & Plan:   Active Problems:   Esophageal stricture   Primary adenocarcinoma of esophagogastric junction (HCC)   Stricture esophagus  Esophageal cancer status post esophagogastrectomy with severe dysphagia and very tight stricture on barium swallow       - s/p post upper GI endoscopy with dilation     - Nodule found in the esophagus, was biopsied.         - CT neck and chest done to further evaluate post dilation     - N.p.o.     - On IV Zosyn empirically per GIs recommendation     - continue IV fluid hydration D5 LR at 75 cc/h     - J-tube feeding resumed     - GI/heme-onc onboard, appreciate assistance  Pneumomediastinum/extravasation of contrast from esophagus     - Possibly secondary to perforation from dilation     - continuous pulse oximetry     - CTS onboard, appreciate assistance; not currently recommending surgical repair  Suspected cancer mets to bones     - Defer to oncology     - Optimize pain control  Severe dysphagia post J-tube placement     - NPO     - J-tube feeds started  Failure to thrive in an adult/severe protein calorie malnutrition d/t above     - Continue feeding supplement through J-tube  Chronic lower back pain     - Due to n.p.o. we will hold  off home oxycodone     - continue IV morphine 1 mg every 4 hours as needed for moderate pain; IV Dilaudid 0.5 mg every 6 hours as needed for severe pain  Long conversation today with patient. He reports that he hasn't had anything by mouth since June. He says that he is tired of all the tubes and sticking. His quote: "I need some quality of life. I want to stop this thing sticking out of me and eat whatever I can for however long I can; and let things be what they may." I talked to him about his understanding of his prognosis. He says, "They've only brought me bad news." He told me, "We could continue down this course, and my wife would help me -- she's a trooper-- but it shouldn't have to be that way. I thought it would be me taking care of her." I talked to him about a palliative care consult. He agreed to speak with palliative care. Consult placed. Continue as above otherwise.   DVT prophylaxis: SCDs Code Status: FULL Family Communication: None at bedside   Disposition Plan: TBD  Consultants:   CTS  GI  Heme-Onc  Antimicrobials:   Zosyn   ROS:  Denies CP, dyspnea, N, V. Remainder 10-pt ROS is negative for all not previously mentioned.  Subjective: "We've been married for 47 years."  Objective: Vitals:   02/07/19  0431 02/07/19 1201 02/07/19 2230 02/08/19 0451  BP: (!) 108/59 (!) 104/59 111/60 116/62  Pulse: 67 67 66 (!) 58  Resp: 18 20 18 18   Temp: 98.2 F (36.8 C) 98.8 F (37.1 C) 99.2 F (37.3 C) 99.2 F (37.3 C)  TempSrc: Oral Oral Oral Oral  SpO2: 97% 97% 97% 97%  Weight: 67 kg   67.7 kg  Height:        Intake/Output Summary (Last 24 hours) at 02/08/2019 1143 Last data filed at 02/08/2019 0555 Gross per 24 hour  Intake 2727.06 ml  Output 175 ml  Net 2552.06 ml   Filed Weights   02/06/19 1046 02/07/19 0431 02/08/19 0451  Weight: 66.2 kg 67 kg 67.7 kg    Examination:  General: 68 y.o. male resting in bed in NAD Eyes: PERRL, normal sclera ENMT: Nares  patent w/o discharge, orophaynx clear, dentition normal, ears w/o discharge/lesions/ulcers Cardiovascular: RRR, +S1, S2, no m/g/r, equal pulses throughout Respiratory: CTABL, no w/r/r, normal WOB GI: BS+, NDNT, no masses noted, no organomegaly noted, j-tube noted MSK: No e/c/c Skin: No rashes, bruises, ulcerations noted Neuro: A&O x 3, no focal deficits Psyc: Appropriate interaction and affect, calm/cooperative   Data Reviewed: I have personally reviewed following labs and imaging studies.  CBC: Recent Labs  Lab 02/06/19 1505 02/07/19 0347 02/08/19 0412  WBC 5.8 14.7* 9.3  NEUTROABS 3.9  --  7.9*  HGB 13.1 13.0 10.8*  HCT 42.2 41.5 34.5*  MCV 90.8 89.8 90.8  PLT 266 271 0000000   Basic Metabolic Panel: Recent Labs  Lab 02/06/19 1505 02/07/19 0347 02/08/19 0412  NA 138 137 138  K 4.3 3.9 3.7  CL 103 102 106  CO2 26 23 25   GLUCOSE 88 160* 129*  BUN 16 15 14   CREATININE 0.75 1.00 0.69  CALCIUM 9.3 8.8* 8.4*  MG 2.3  --  2.3  PHOS 4.7*  --  3.1   GFR: Estimated Creatinine Clearance: 82.3 mL/min (by C-G formula based on SCr of 0.69 mg/dL). Liver Function Tests: Recent Labs  Lab 02/06/19 1505 02/08/19 0412  AST 21  --   ALT 23  --   ALKPHOS 108  --   BILITOT 0.6  --   PROT 7.0  --   ALBUMIN 3.8 2.7*   No results for input(s): LIPASE, AMYLASE in the last 168 hours. No results for input(s): AMMONIA in the last 168 hours. Coagulation Profile: No results for input(s): INR, PROTIME in the last 168 hours. Cardiac Enzymes: No results for input(s): CKTOTAL, CKMB, CKMBINDEX, TROPONINI in the last 168 hours. BNP (last 3 results) No results for input(s): PROBNP in the last 8760 hours. HbA1C: No results for input(s): HGBA1C in the last 72 hours. CBG: Recent Labs  Lab 02/07/19 1610 02/07/19 1950 02/07/19 2343 02/08/19 0448 02/08/19 0740  GLUCAP 108* 139* 140* 137* 136*   Lipid Profile: No results for input(s): CHOL, HDL, LDLCALC, TRIG, CHOLHDL, LDLDIRECT in the  last 72 hours. Thyroid Function Tests: No results for input(s): TSH, T4TOTAL, FREET4, T3FREE, THYROIDAB in the last 72 hours. Anemia Panel: No results for input(s): VITAMINB12, FOLATE, FERRITIN, TIBC, IRON, RETICCTPCT in the last 72 hours. Sepsis Labs: No results for input(s): PROCALCITON, LATICACIDVEN in the last 168 hours.  Recent Results (from the past 240 hour(s))  SARS CORONAVIRUS 2 Nasal Swab Aptima Multi Swab     Status: None   Collection Time: 02/04/19 11:03 AM   Specimen: Aptima Multi Swab; Nasal Swab  Result Value Ref Range  Status   SARS Coronavirus 2 NEGATIVE NEGATIVE Final    Comment: (NOTE) SARS-CoV-2 target nucleic acids are NOT DETECTED. The SARS-CoV-2 RNA is generally detectable in upper and lower respiratory specimens during the acute phase of infection. Negative results do not preclude SARS-CoV-2 infection, do not rule out co-infections with other pathogens, and should not be used as the sole basis for treatment or other patient management decisions. Negative results must be combined with clinical observations, patient history, and epidemiological information. The expected result is Negative. Fact Sheet for Patients: SugarRoll.be Fact Sheet for Healthcare Providers: https://www.woods-mathews.com/ This test is not yet approved or cleared by the [place] FDA and  has been authorized for detection and/or diagnosis of SARS-CoV-2 by FDA under an Emergency Use Authorization (EUA). This EUA will remain  in effect (meaning this test can be used) for the duration of the COVID-19 declaration under Section 56 4(b)(1) of the Act, 21 U.S.C. section 360bbb-3(b)(1), unless the authorization is terminated or revoked sooner. Performed at [facility], [place] [address] [place], [place] [address]   Surgical PCR screen     Status: None   Collection Time: 02/07/19  1:18 PM   Specimen: Nasal Mucosa; Nasal Swab  Result Value Ref  Range Status   MRSA, PCR NEGATIVE NEGATIVE Final   Staphylococcus aureus NEGATIVE NEGATIVE Final    Comment: (NOTE) The Xpert SA Assay (FDA approved for NASAL specimens in patients 69 years of age and older), is one component of a comprehensive surveillance program. It is not intended to diagnose infection nor to guide or monitor treatment. Performed at [facility], [place] [address] [place], [place] [address]       Radiology Studies: Ct Soft Tissue Neck W Contrast  Result Date: 02/06/2019 CLINICAL DATA:  Esophagectomy. Esophagogastric ostomy. Dilatation of anastomosis today. EXAM: CT NECK WITH CONTRAST TECHNIQUE: Multidetector CT imaging of the neck was performed using the standard protocol following the bolus administration of intravenous contrast. CONTRAST:  63mL OMNIPAQUE IOHEXOL 300 MG/ML SOLN, 65mL OMNIPAQUE IOHEXOL 300 MG/ML SOLN COMPARISON:  CT neck 12/02/2018 FINDINGS: Pharynx and larynx: Normal pharynx. Epiglottis and larynx normal. Normal airway. Salivary glands: No inflammation, mass, or stone. Thyroid: Small thyroid nodules.  No mass lesion. Lymph nodes: No enlarged lymph nodes in the neck. Vascular: Normal vascular enhancement. Atherosclerotic calcification carotid bifurcation bilaterally. Limited intracranial: Negative Visualized orbits: Negative Mastoids and visualized paranasal sinuses: Bony thickening left maxillary sinus due to chronic inflammation. The remaining sinuses are clear. Skeleton: Ill-defined sclerotic lesions T2 and T4 have developed since 12/02/2018. Sclerotic lesion in the left second rib has progressed in the interval. Probable metastatic disease. No fracture. Upper chest: Extraluminal gas in the mediastinum. Small amount of gas in the right neck. Extraluminal contrast to the left of the anastomosis could be from prior extravasation or from today. The patient did receive a small amount of oral contrast. Findings compatible with mucosal tear from  dilatation today. No fluid collection or abscess. Mild soft tissue thickening at the anastomosis could represent inflammation or tumor. Other: None IMPRESSION: 1. Negative for mass or adenopathy in the neck 2. Extraluminal gas in the mediastinum and right neck compatible with perforation following esophagogastroscopy dilatation today. No free fluid or abscess. 3. Sclerotic lesions at T2, T4, and left second rib have progressed since the recent CT compatible with bony metastatic disease. Electronically Signed   By: Franchot Gallo M.D.   On: 02/06/2019 17:18   Ct Chest W Contrast  Result Date:  02/06/2019 CLINICAL DATA:  Status post esophageal dilatation with history of adenocarcinoma of the gastroesophageal junction. Value 8 for esophageal leak. EXAM: CT CHEST WITH CONTRAST TECHNIQUE: Multidetector CT imaging of the chest was performed during intravenous contrast administration. CONTRAST:  9mL OMNIPAQUE IOHEXOL 300 MG/ML SOLN, 57mL OMNIPAQUE IOHEXOL 300 MG/ML SOLN COMPARISON:  None. FINDINGS: Cardiovascular: Heart size upper normal. No substantial pericardial effusion. Atherosclerotic calcification is noted in the wall of the thoracic aorta. Mediastinum/Nodes: There is evidence of pneumomediastinum with gas in the left paraesophageal space near the proximal anastomosis and tracking anteriorly and up into the neck and down towards the abdomen. As seen on the upper GI series from 3 days ago, there is an apparent tight stricture of the esophagus near the anastomosis just below the thoracic inlet. At the level of the stricture, there is a 2.8 x 2.2 cm ill-defined soft tissue lesion that appears to essentially circumferentially surround the narrowed lumen. Gas and contrast material are identified in the extraluminal paraesophageal soft tissues in the region of the stricture(axial image 34/series 2). Gas and contrast material are seen in the mediastinum to the left of the proximal esophagus (axial 42/series 2 and  coronal 70/series 6). No mediastinal lymphadenopathy. There is no hilar lymphadenopathy. There is no axillary lymphadenopathy. Lungs/Pleura: Dependent atelectasis/infiltrate is noted in the posterior lower lobes bilaterally Upper Abdomen: 9 mm hypoattenuating lesion in the dome of the liver (116/2) is stable since CT of 10/24/2018. A larger low-density lesion in the left liver seen on that study is not evident today. Thickening of the adrenal glands bilaterally, left greater than right is similar to prior. Prior imaging characterized these adrenal findings as adenomatous. Musculoskeletal: 10 mm sclerotic focus posterior T4 vertebral body (axial 45/2 and sagittal 86/7) not definitely seen on the prior study. There may be a subtle sclerotic lesion in the T2 vertebral body. Sclerosis in the posterior left second rib is similar to prior. IMPRESSION: 1. Pneumomediastinum is associated with extraluminal contrast to the left of the patient's known proximal esophageal stricture. Imaging features consistent with esophageal perforation, most likely at the level of the stricture. 2. Proximal esophageal stricture is in the region the anastomosis and involved by a 2.8 x 2.2 cm focus of ill-defined soft tissue. Recurrent disease a concern. 3. 10 mm subtle sclerotic focus in the posterior T4 vertebral body, not definitely seen previously and concerning for new bony metastatic disease. Potential new lesion at the T2 level. Close attention on follow-up recommended. Critical Value/emergent results were called by me at the time of interpretation on 02/06/2019 at 5:17 pm to Dr. [name] , who verbally acknowledged these results. Electronically Signed   By: Misty Stanley M.D.   On: 02/06/2019 17:18   Dg Chest Port 1 View  Result Date: 02/07/2019 CLINICAL DATA:  Esophageal perforation. EXAM: PORTABLE CHEST 1 VIEW COMPARISON:  CT scan of February 06, 2019. Radiographs of December 31, 2018. FINDINGS: Stable cardiac silhouette. Stable  dilated proximal thoracic esophagus is noted. No pneumothorax is noted. Right lung is clear. Small left pleural effusion is noted with associated atelectasis or infiltrate. Bony thorax is unremarkable. IMPRESSION: Small left pleural effusion is noted which is increased compared to prior exam, with associated left basilar atelectasis or infiltrate. Electronically Signed   By: Marijo Conception M.D.   On: 02/07/2019 05:42   Dg C-arm 1-60 Min  Result Date: 02/06/2019 CLINICAL DATA:  Esophageal dilatation under fluoroscopy. EXAM: DG C-ARM 61-120 MIN COMPARISON:  Esophagram 02/03/2019 FINDINGS: Single image  from patient's esophageal dilatation under fluoroscopy is submitted for review. Endoscope with wire seen over the level of the cervical/proximal esophagus crossing region of known stricture at the esophagogastric anastomosis. Recommend correlation with findings at the time of the procedure. IMPRESSION: Dilatation of known stricture at the esophagogastric anastomosis as described. Electronically Signed   By: Marin Olp M.D.   On: 02/06/2019 13:37     Scheduled Meds:  feeding supplement (PRO-STAT SUGAR FREE 64)  30 mL Per Tube BID   free water  100 mL Per Tube QID   [name]'s butt cream   Topical BID   mupirocin ointment  1 application Nasal BID   Continuous Infusions:  feeding supplement (OSMOLITE 1.5 CAL) 1,000 mL (02/08/19 0010)   piperacillin-tazobactam (ZOSYN)  IV 3.375 g (02/08/19 0600)   vancomycin 750 mg (02/08/19 1030)     LOS: 1 day    Time spent: 25 minutes spent in the coordination of care today.   Jonnie Finner, DO [facility] Pager [phone]  If 7PM-7AM, please contact night-coverage [email or web address] Password [facility] 02/08/2019, 11:43 AM

## 2019-02-09 LAB — GLUCOSE, CAPILLARY
Glucose-Capillary: 108 mg/dL — ABNORMAL HIGH (ref 70–99)
Glucose-Capillary: 114 mg/dL — ABNORMAL HIGH (ref 70–99)
Glucose-Capillary: 116 mg/dL — ABNORMAL HIGH (ref 70–99)
Glucose-Capillary: 117 mg/dL — ABNORMAL HIGH (ref 70–99)
Glucose-Capillary: 145 mg/dL — ABNORMAL HIGH (ref 70–99)
Glucose-Capillary: 87 mg/dL (ref 70–99)
Glucose-Capillary: 98 mg/dL (ref 70–99)

## 2019-02-09 LAB — CBC WITH DIFFERENTIAL/PLATELET
Abs Immature Granulocytes: 0.02 10*3/uL (ref 0.00–0.07)
Basophils Absolute: 0 10*3/uL (ref 0.0–0.1)
Basophils Relative: 1 %
Eosinophils Absolute: 0.2 10*3/uL (ref 0.0–0.5)
Eosinophils Relative: 3 %
HCT: 35.2 % — ABNORMAL LOW (ref 39.0–52.0)
Hemoglobin: 10.8 g/dL — ABNORMAL LOW (ref 13.0–17.0)
Immature Granulocytes: 0 %
Lymphocytes Relative: 10 %
Lymphs Abs: 0.7 10*3/uL (ref 0.7–4.0)
MCH: 28 pg (ref 26.0–34.0)
MCHC: 30.7 g/dL (ref 30.0–36.0)
MCV: 91.2 fL (ref 80.0–100.0)
Monocytes Absolute: 0.6 10*3/uL (ref 0.1–1.0)
Monocytes Relative: 8 %
Neutro Abs: 5.2 10*3/uL (ref 1.7–7.7)
Neutrophils Relative %: 78 %
Platelets: 193 10*3/uL (ref 150–400)
RBC: 3.86 MIL/uL — ABNORMAL LOW (ref 4.22–5.81)
RDW: 13.4 % (ref 11.5–15.5)
WBC: 6.6 10*3/uL (ref 4.0–10.5)
nRBC: 0 % (ref 0.0–0.2)

## 2019-02-09 LAB — RENAL FUNCTION PANEL
Albumin: 2.7 g/dL — ABNORMAL LOW (ref 3.5–5.0)
Anion gap: 5 (ref 5–15)
BUN: 14 mg/dL (ref 8–23)
CO2: 28 mmol/L (ref 22–32)
Calcium: 8.5 mg/dL — ABNORMAL LOW (ref 8.9–10.3)
Chloride: 107 mmol/L (ref 98–111)
Creatinine, Ser: 0.69 mg/dL (ref 0.61–1.24)
GFR calc Af Amer: >60 mL/min (ref >60)
GFR calc non Af Amer: >60 mL/min (ref >60)
Glucose, Bld: 128 mg/dL — ABNORMAL HIGH (ref 70–99)
Phosphorus: 3.8 mg/dL (ref 2.5–4.6)
Potassium: 4 mmol/L (ref 3.5–5.1)
Sodium: 140 mmol/L (ref 135–145)

## 2019-02-09 NOTE — Progress Notes (Signed)
      [address]       [name] [address]             [phone]                 3 Days Post-Op Procedure(s) (LRB): ESOPHAGOGASTRODUODENOSCOPY (EGD) WITH PROPOFOL (N/A) BALLOON DILATION (N/A) BIOPSY  LOS: 2 days   Subjective: Feels ok today   Objective: Vital signs in last 24 hours: Patient Vitals for the past 24 hrs:  BP Temp Temp src Pulse Resp SpO2  02/09/19 1509 (!) 109/58 98 F (36.7 C) Oral (!) 47 - 100 %  02/09/19 1409 - - - - - 98 %  02/09/19 0536 110/61 98.1 F (36.7 C) Oral (!) 46 - 97 %  02/08/19 2117 111/64 98.4 F (36.9 C) Oral (!) 50 18 96 %    Filed Weights   02/06/19 1046 02/07/19 0431 02/08/19 0451  Weight: 66.2 kg 67 kg 67.7 kg    Hemodynamic parameters for last 24 hours:    Intake/Output from previous day: 08/22 0701 - 08/23 0700 In: 2027.7 [NG/GT:1646.3; IV Piggyback:381.4] Out: -  Intake/Output this shift: Total I/O In: 528 [NG/GT:480; IV Piggyback:48] Out: -   Scheduled Meds: . feeding supplement (PRO-STAT SUGAR FREE 64)  30 mL Per Tube BID  . free water  100 mL Per Tube QID  . Jenniefer Salak's butt cream   Topical BID  . mupirocin ointment  1 application Nasal BID   Continuous Infusions: . feeding supplement (OSMOLITE 1.5 CAL) 1,000 mL (02/08/19 2119)  . piperacillin-tazobactam (ZOSYN)  IV 3.375 g (02/09/19 1412)  . vancomycin 750 mg (02/09/19 1018)   PRN Meds:.acetaminophen (TYLENOL) oral liquid 160 mg/5 mL, HYDROmorphone (DILAUDID) injection, morphine injection, ondansetron (ZOFRAN) IV  General appearance: alert and cooperative Neurologic: intact Heart: regular rate and rhythm, S1, S2 normal, no murmur, click, rub or gallop Lungs: clear to auscultation bilaterally Abdomen: soft, non-tender; bowel sounds normal; no masses,  no organomegaly Extremities: extremities normal, atraumatic, no cyanosis or edema and Homans sign is negative, no sign of DVT j tube site irritated and leaking, tightened   Lab Results: CBC:  Recent Labs    02/08/19 0412 02/09/19 0523  WBC 9.3 6.6  HGB 10.8* 10.8*  HCT 34.5* 35.2*  PLT 195 193   BMET:  Recent Labs    02/08/19 0412 02/09/19 0523  NA 138 140  K 3.7 4.0  CL 106 107  CO2 25 28  GLUCOSE 129* 128*  BUN 14 14  CREATININE 0.69 0.69  CALCIUM 8.4* 8.5*    PT/INR: No results for input(s): LABPROT, INR in the last 72 hours.   Radiology No results found.   Assessment/Plan: S/P Procedure(s) (LRB): ESOPHAGOGASTRODUODENOSCOPY (EGD) WITH PROPOFOL (N/A) BALLOON DILATION (N/A) BIOPSY No fever, wbc stable  Agree with swallow tomorrow Waiting for bx result of nodular area suggestion of bony mets discussed with patient but finding inconclusive at this pont    [name] MD 02/09/2019 6:45 PM     Patient ID: [name] [name], male   DOB: 25-Jan-1951, 68 y.o.   MRN: [id number]

## 2019-02-09 NOTE — Progress Notes (Signed)
PROGRESS NOTE    [name] [name]  [id number] DOB: [birth year] DOA: 02/06/2019 PCP: Binnie Rail, MD  Brief Narrative:  68 year old male with prior history of esophageal cancer status post esophagogastrectomy, was found to have severe dysphagia, underwent Gastrografin swallow showing anastomotic  malignant stricture.  Subsequently he underwent an endoscopy with dilatation.  Post endoscopy and dilatation patient was found to have pneumomediastinum and some extravasation of contrast from the esophagus.. Patient was made n.p.o. and referred to [facility] for admission for further evaluation.   Assessment & Plan:   Active Problems:   Esophageal stricture   Primary adenocarcinoma of esophagogastric junction (HCC)   Stricture esophagus   Esophageal cancer s/p esophagogastrectomy,  Malignant stricture s/p endoscopy and dilatation with subsequent pneumomediastinum: - currently NPO but  continue with tube feeds via jejunal tube.  - continue with IV vancomycin and IV Zosyn empirically to prevent further infection.  - GI and CTVS / onc on board.  - plan for gastrografin swallow evaluation in am to check the status of esophageal perforation. - further recommendations as per GI.    FAILURE to thrive and severe protein calorie malnutrition Nutrition consult and tube feeds via J-tube.    Lower back pain Chronic Pain control with IV Dilaudid.  In view of metastatic esophageal cancer and poor nutrition and failure to thrive and poor quality of life palliative care consulted and for now to continue with full scope of care.  Palliative care team to follow.   Anemia of chronic disease Hemoglobin stable at 10.8. Continue to monitor counts intermittently.         DVT prophylaxis: SCDs Code Status: Full code Family Communication: None at bedside Disposition Plan: Pending further work-up   Consultants:   Gastroenterology Dr. [name]  Dr. Servando Snare with CT [facility]  Oncology   Procedures:  Gastrografin swallow study scheduled tomorrow Antimicrobials: IV vancomycin and IV Zosyn since admission     Subjective: Low back pain requesting pain medication, no nausea or vomiting or abdominal pain no chest pain or shortness of breath.  Objective: Vitals:   02/08/19 1343 02/08/19 2117 02/09/19 0536 02/09/19 1409  BP: (!) 112/59 111/64 110/61   Pulse: 60 (!) 50 (!) 46   Resp: (!) 24 18    Temp: 98.3 F (36.8 C) 98.4 F (36.9 C) 98.1 F (36.7 C)   TempSrc: Oral Oral Oral   SpO2: 99% 96% 97% 98%  Weight:      Height:        Intake/Output Summary (Last 24 hours) at 02/09/2019 1425 Last data filed at 02/09/2019 0601 Gross per 24 hour  Intake 2027.67 ml  Output -  Net 2027.67 ml   Filed Weights   02/06/19 1046 02/07/19 0431 02/08/19 0451  Weight: 66.2 kg 67 kg 67.7 kg    Examination:  General exam: Appears calm and comfortable  Respiratory system: Clear to auscultation. Respiratory effort normal. Cardiovascular system: S1 & S2 heard, RRR. No JVD, Gastrointestinal system: Abdomen is soft, nontender, G-tube in place, bowel sounds are good Central nervous system: Alert and oriented. No focal neurological deficits. Extremities: Symmetric 5 x 5 power. Skin: No rashes, lesions or ulcers Psychiatry: . Mood & affect appropriate.     Data Reviewed: I have personally reviewed following labs and imaging studies  CBC: Recent Labs  Lab 02/06/19 1505 02/07/19 0347 02/08/19 0412 02/09/19 0523  WBC 5.8 14.7* 9.3 6.6  NEUTROABS 3.9  --  7.9* 5.2  HGB 13.1 13.0 10.8* 10.8*  HCT 42.2  41.5 34.5* 35.2*  MCV 90.8 89.8 90.8 91.2  PLT 266 271 195 0000000   Basic Metabolic Panel: Recent Labs  Lab 02/06/19 1505 02/07/19 0347 02/08/19 0412 02/09/19 0523  NA 138 137 138 140  K 4.3 3.9 3.7 4.0  CL 103 102 106 107  CO2 26 23 25 28   GLUCOSE 88 160* 129* 128*  BUN 16 15 14 14   CREATININE 0.75 1.00 0.69 0.69  CALCIUM 9.3 8.8* 8.4* 8.5*  MG 2.3  --  2.3  --    PHOS 4.7*  --  3.1 3.8   GFR: Estimated Creatinine Clearance: 82.3 mL/min (by C-G formula based on SCr of 0.69 mg/dL). Liver Function Tests: Recent Labs  Lab 02/06/19 1505 02/08/19 0412 02/09/19 0523  AST 21  --   --   ALT 23  --   --   ALKPHOS 108  --   --   BILITOT 0.6  --   --   PROT 7.0  --   --   ALBUMIN 3.8 2.7* 2.7*   No results for input(s): LIPASE, AMYLASE in the last 168 hours. No results for input(s): AMMONIA in the last 168 hours. Coagulation Profile: No results for input(s): INR, PROTIME in the last 168 hours. Cardiac Enzymes: No results for input(s): CKTOTAL, CKMB, CKMBINDEX, TROPONINI in the last 168 hours. BNP (last 3 results) No results for input(s): PROBNP in the last 8760 hours. HbA1C: No results for input(s): HGBA1C in the last 72 hours. CBG: Recent Labs  Lab 02/08/19 2122 02/09/19 0025 02/09/19 0431 02/09/19 0730 02/09/19 1210  GLUCAP 131* 108* 98 117* 114*   Lipid Profile: No results for input(s): CHOL, HDL, LDLCALC, TRIG, CHOLHDL, LDLDIRECT in the last 72 hours. Thyroid Function Tests: No results for input(s): TSH, T4TOTAL, FREET4, T3FREE, THYROIDAB in the last 72 hours. Anemia Panel: No results for input(s): VITAMINB12, FOLATE, FERRITIN, TIBC, IRON, RETICCTPCT in the last 72 hours. Sepsis Labs: No results for input(s): PROCALCITON, LATICACIDVEN in the last 168 hours.  Recent Results (from the past 240 hour(s))  SARS CORONAVIRUS 2 Nasal Swab Aptima Multi Swab     Status: None   Collection Time: 02/04/19 11:03 AM   Specimen: Aptima Multi Swab; Nasal Swab  Result Value Ref Range Status   SARS Coronavirus 2 NEGATIVE NEGATIVE Final    Comment: (NOTE) SARS-CoV-2 target nucleic acids are NOT DETECTED. The SARS-CoV-2 RNA is generally detectable in upper and lower respiratory specimens during the acute phase of infection. Negative results do not preclude SARS-CoV-2 infection, do not rule out co-infections with other pathogens, and should not be  used as the sole basis for treatment or other patient management decisions. Negative results must be combined with clinical observations, patient history, and epidemiological information. The expected result is Negative. Fact Sheet for Patients: SugarRoll.be Fact Sheet for Healthcare Providers: https://www.woods-mathews.com/ This test is not yet approved or cleared by the [place] FDA and  has been authorized for detection and/or diagnosis of SARS-CoV-2 by FDA under an Emergency Use Authorization (EUA). This EUA will remain  in effect (meaning this test can be used) for the duration of the COVID-19 declaration under Section 56 4(b)(1) of the Act, 21 U.S.C. section 360bbb-3(b)(1), unless the authorization is terminated or revoked sooner. Performed at [facility], [place] [address], [place], [place] [address]   Surgical PCR screen     Status: None   Collection Time: 02/07/19  1:18 PM   Specimen: Nasal Mucosa; Nasal Swab  Result Value Ref  Range Status   MRSA, PCR NEGATIVE NEGATIVE Final   Staphylococcus aureus NEGATIVE NEGATIVE Final    Comment: (NOTE) The Xpert SA Assay (FDA approved for NASAL specimens in patients 66 years of age and older), is one component of a comprehensive surveillance program. It is not intended to diagnose infection nor to guide or monitor treatment. Performed at [facility], [place] [address] [place], [place] [address]          Radiology Studies: No results found.      Scheduled Meds: . feeding supplement (PRO-STAT SUGAR FREE 64)  30 mL Per Tube BID  . free water  100 mL Per Tube QID  . [name]'s butt cream   Topical BID  . mupirocin ointment  1 application Nasal BID   Continuous Infusions: . feeding supplement (OSMOLITE 1.5 CAL) 1,000 mL (02/08/19 2119)  . piperacillin-tazobactam (ZOSYN)  IV 3.375 g (02/09/19 1412)  . vancomycin 750 mg (02/09/19 1018)     LOS: 2 days     Time spent: 34 minutes    Hosie Poisson, MD [facility] Pager [phone]  If 7PM-7AM, please contact night-coverage [email or web address] Password [email or web address] 02/09/2019, 2:25 PM

## 2019-02-09 NOTE — Progress Notes (Signed)
    Remote note - chart reviewed.  Appreciate TRH and palliative care help in this difficult situation.  I have ordered a gastrograffen swallow for tomorrow AM to check status of esophageal perforation.  I will see him in person tomorrow.  Gatha Mayer, MD, [facility] 02/09/2019 7:29 AM Pager [phone] Cell [phone] please text first

## 2019-02-10 ENCOUNTER — Inpatient Hospital Stay (HOSPITAL_COMMUNITY): Payer: Medicare Other

## 2019-02-10 LAB — GLUCOSE, CAPILLARY
Glucose-Capillary: 106 mg/dL — ABNORMAL HIGH (ref 70–99)
Glucose-Capillary: 112 mg/dL — ABNORMAL HIGH (ref 70–99)
Glucose-Capillary: 128 mg/dL — ABNORMAL HIGH (ref 70–99)
Glucose-Capillary: 84 mg/dL (ref 70–99)
Glucose-Capillary: 125 mg/dL — ABNORMAL HIGH (ref 70–99)

## 2019-02-10 LAB — CREATININE, SERUM
Creatinine, Ser: 0.69 mg/dL (ref 0.61–1.24)
GFR calc Af Amer: >60 mL/min (ref >60)
GFR calc non Af Amer: >60 mL/min (ref >60)

## 2019-02-10 MED ORDER — IOHEXOL 300 MG/ML  SOLN
50.0000 mL | Freq: Once | INTRAMUSCULAR | Status: AC | PRN
  Administered 2019-02-10: 50 mL via ORAL

## 2019-02-10 NOTE — Care Management Important Message (Signed)
Important Message  Patient Details  Name: [name] [name] MRN: [id number] Date of Birth: 08-19-1950   Medicare Important Message Given:  Yes. CMA printed out IM for the Case Management Nurse or CSW to give to patient.      Brystol Wasilewski 02/10/2019, 8:43 AM

## 2019-02-10 NOTE — Progress Notes (Signed)
PROGRESS NOTE    [name] [name]  [id number] DOB: 16-May-1951 DOA: 02/06/2019 PCP: Binnie Rail, MD   Brief Narrative:  68 year old male with prior history of esophageal cancer status post esophagogastrectomy, was found to have severe dysphagia, underwent Gastrografin swallow showing anastomotic  malignant stricture.  Subsequently he underwent an endoscopy with dilatation.  Post endoscopy and dilatation patient was found to have pneumomediastinum and some extravasation of contrast from the esophagus.. Patient was made n.p.o. and referred to [facility] for admission for further evaluation   Assessment & Plan:   Active Problems:   Esophageal stricture   Primary adenocarcinoma of esophagogastric junction (HCC)   Stricture esophagus   Esophageal cancer s/p esophagogastrectomy,  Malignant stricture s/p endoscopy and dilatation with subsequent pneumomediastinum: - currently NPO but  continue with tube feeds via jejunal tube with plans for clears per GI.  - continue with IV vancomycin and IV Zosyn empirically to prevent further infection.  - GI and CTVS / onc on board.  - NEG gastrografin swallow evaluation THIS am . - further recommendations as per GI.   FAILURE to thrive and severe protein calorie malnutrition Nutrition consult and tube feeds via J-tube.  Lower back pain Chronic Pain control with IV Dilaudid.  In view of metastatic esophageal cancer and poor nutrition and failure to thrive and poor quality of life palliative care consulted and for now to continue with full scope of care.  Palliative care team to follow.   Anemia of chronic disease Hemoglobin stable at 10.8. Continue to monitor counts intermittently.  DVT prophylaxis: SCD/Compression stockings  Code Status: FULL    Code Status Orders  (From admission, onward)         Start     Ordered   02/06/19 1708  Full code  Continuous     02/06/19 1707        Code Status History    Date Active Date  Inactive Code Status Order ID Comments User Context   11/25/2018 1541 12/18/2018 1634 Full Code [id number]  [name] PA-C Inpatient   Advance Care Planning Activity    Advance Directive Documentation     Most Recent Value  Type of Advance Directive  Living will  Pre-existing out of facility DNR order (yellow form or pink MOST form)  --  "MOST" Form in Place?  --     Family Communication: None today Disposition Plan:   Patient remained inpatient for continued management of pneumomediastinum and esophageal perforation with GI and cardiothoracic surgery subspecialty evaluation. Consults called: None Admission status: Inpatient   Consultants:   Cardiothoracic surgery, gastroenterology  Procedures:  Ct Soft Tissue Neck W Contrast  Result Date: 02/06/2019 CLINICAL DATA:  Esophagectomy. Esophagogastric ostomy. Dilatation of anastomosis today. EXAM: CT NECK WITH CONTRAST TECHNIQUE: Multidetector CT imaging of the neck was performed using the standard protocol following the bolus administration of intravenous contrast. CONTRAST:  1mL OMNIPAQUE IOHEXOL 300 MG/ML SOLN, 21mL OMNIPAQUE IOHEXOL 300 MG/ML SOLN COMPARISON:  CT neck 12/02/2018 FINDINGS: Pharynx and larynx: Normal pharynx. Epiglottis and larynx normal. Normal airway. Salivary glands: No inflammation, mass, or stone. Thyroid: Small thyroid nodules.  No mass lesion. Lymph nodes: No enlarged lymph nodes in the neck. Vascular: Normal vascular enhancement. Atherosclerotic calcification carotid bifurcation bilaterally. Limited intracranial: Negative Visualized orbits: Negative Mastoids and visualized paranasal sinuses: Bony thickening left maxillary sinus due to chronic inflammation. The remaining sinuses are clear. Skeleton: Ill-defined sclerotic lesions T2 and T4 have developed since 12/02/2018. Sclerotic lesion in the left  second rib has progressed in the interval. Probable metastatic disease. No fracture. Upper chest: Extraluminal gas in the  mediastinum. Small amount of gas in the right neck. Extraluminal contrast to the left of the anastomosis could be from prior extravasation or from today. The patient did receive a small amount of oral contrast. Findings compatible with mucosal tear from dilatation today. No fluid collection or abscess. Mild soft tissue thickening at the anastomosis could represent inflammation or tumor. Other: None IMPRESSION: 1. Negative for mass or adenopathy in the neck 2. Extraluminal gas in the mediastinum and right neck compatible with perforation following esophagogastroscopy dilatation today. No free fluid or abscess. 3. Sclerotic lesions at T2, T4, and left second rib have progressed since the recent CT compatible with bony metastatic disease. Electronically Signed   By: Franchot Gallo M.D.   On: 02/06/2019 17:18   Ct Chest W Contrast  Result Date: 02/06/2019 CLINICAL DATA:  Status post esophageal dilatation with history of adenocarcinoma of the gastroesophageal junction. Value 8 for esophageal leak. EXAM: CT CHEST WITH CONTRAST TECHNIQUE: Multidetector CT imaging of the chest was performed during intravenous contrast administration. CONTRAST:  37mL OMNIPAQUE IOHEXOL 300 MG/ML SOLN, 26mL OMNIPAQUE IOHEXOL 300 MG/ML SOLN COMPARISON:  None. FINDINGS: Cardiovascular: Heart size upper normal. No substantial pericardial effusion. Atherosclerotic calcification is noted in the wall of the thoracic aorta. Mediastinum/Nodes: There is evidence of pneumomediastinum with gas in the left paraesophageal space near the proximal anastomosis and tracking anteriorly and up into the neck and down towards the abdomen. As seen on the upper GI series from 3 days ago, there is an apparent tight stricture of the esophagus near the anastomosis just below the thoracic inlet. At the level of the stricture, there is a 2.8 x 2.2 cm ill-defined soft tissue lesion that appears to essentially circumferentially surround the narrowed lumen. Gas and  contrast material are identified in the extraluminal paraesophageal soft tissues in the region of the stricture(axial image 34/series 2). Gas and contrast material are seen in the mediastinum to the left of the proximal esophagus (axial 42/series 2 and coronal 70/series 6). No mediastinal lymphadenopathy. There is no hilar lymphadenopathy. There is no axillary lymphadenopathy. Lungs/Pleura: Dependent atelectasis/infiltrate is noted in the posterior lower lobes bilaterally Upper Abdomen: 9 mm hypoattenuating lesion in the dome of the liver (116/2) is stable since CT of 10/24/2018. A larger low-density lesion in the left liver seen on that study is not evident today. Thickening of the adrenal glands bilaterally, left greater than right is similar to prior. Prior imaging characterized these adrenal findings as adenomatous. Musculoskeletal: 10 mm sclerotic focus posterior T4 vertebral body (axial 45/2 and sagittal 86/7) not definitely seen on the prior study. There may be a subtle sclerotic lesion in the T2 vertebral body. Sclerosis in the posterior left second rib is similar to prior. IMPRESSION: 1. Pneumomediastinum is associated with extraluminal contrast to the left of the patient's known proximal esophageal stricture. Imaging features consistent with esophageal perforation, most likely at the level of the stricture. 2. Proximal esophageal stricture is in the region the anastomosis and involved by a 2.8 x 2.2 cm focus of ill-defined soft tissue. Recurrent disease a concern. 3. 10 mm subtle sclerotic focus in the posterior T4 vertebral body, not definitely seen previously and concerning for new bony metastatic disease. Potential new lesion at the T2 level. Close attention on follow-up recommended. Critical Value/emergent results were called by me at the time of interpretation on 02/06/2019 at 5:17 pm to Dr.  [name] , who verbally acknowledged these results. Electronically Signed   By: Misty Stanley M.D.   On:  02/06/2019 17:18   Dg Chest Port 1 View  Result Date: 02/07/2019 CLINICAL DATA:  Esophageal perforation. EXAM: PORTABLE CHEST 1 VIEW COMPARISON:  CT scan of February 06, 2019. Radiographs of December 31, 2018. FINDINGS: Stable cardiac silhouette. Stable dilated proximal thoracic esophagus is noted. No pneumothorax is noted. Right lung is clear. Small left pleural effusion is noted with associated atelectasis or infiltrate. Bony thorax is unremarkable. IMPRESSION: Small left pleural effusion is noted which is increased compared to prior exam, with associated left basilar atelectasis or infiltrate. Electronically Signed   By: Marijo Conception M.D.   On: 02/07/2019 05:42   Dg C-arm 1-60 Min  Result Date: 02/06/2019 CLINICAL DATA:  Esophageal dilatation under fluoroscopy. EXAM: DG C-ARM 61-120 MIN COMPARISON:  Esophagram 02/03/2019 FINDINGS: Single image from patient's esophageal dilatation under fluoroscopy is submitted for review. Endoscope with wire seen over the level of the cervical/proximal esophagus crossing region of known stricture at the esophagogastric anastomosis. Recommend correlation with findings at the time of the procedure. IMPRESSION: Dilatation of known stricture at the esophagogastric anastomosis as described. Electronically Signed   By: Marin Olp M.D.   On: 02/06/2019 13:37   Dg Esophagus W Single Cm (sol Or Thin Ba)  Result Date: 02/10/2019 CLINICAL DATA:  Esophageal perforation after esophageal dilatation. EXAM: ESOPHOGRAM/BARIUM SWALLOW TECHNIQUE: Single contrast examination was performed using  water-soluble. FLUOROSCOPY TIME:  Fluoroscopy Time:  1 minutes 12 seconds Radiation Exposure Index (if provided by the fluoroscopic device): 12.9 mGy Number of Acquired Spot Images: 13 COMPARISON:  CT chest 02/06/2019, esophagram 02/03/2019. FINDINGS: There is persistent fairly high-grade narrowing in the proximal esophagus, but improved from 02/03/2019. No leak. IMPRESSION: Improved but  persistent fairly high-grade narrowing in the proximal esophagus. No leak. Electronically Signed   By: Lorin Picket M.D.   On: 02/10/2019 09:56   Dg Esophagus W Single Cm (sol Or Thin Ba)  Result Date: 02/03/2019 CLINICAL DATA:  Status post complete esophagectomy with cervical esophagogastric anastomosis [id number], complicated by anastomotic leak. Patient now presents with worsening dysphagia with sensation of food and liquid sticking in the neck. EXAM: ESOPHOGRAM/BARIUM SWALLOW TECHNIQUE: Single contrast examination was performed using water-soluble contrast initially and thin barium. FLUOROSCOPY TIME:  Fluoroscopy Time:  2 minutes 6 seconds Radiation Exposure Index (if provided by the fluoroscopic device): 65 mGy Number of Acquired Spot Images: 13 COMPARISON:  12/16/2018 esophagram.  12/02/2018 CT neck. FINDINGS: Initial frontal and bilateral oblique scout radiographs of the neck were obtained demonstrating no soft tissue emphysema. There is no residual leak identified at the esophagogastric anastomosis at the level of the thoracic inlet. There is a high-grade pinhole anastomotic stricture at the esophagogastric anastomosis at the level of the thoracic inlet, with a high velocity jet observed with water soluble and thin barium passage through the stricture site. No discrete mass. No evidence of laryngeal penetration or tracheobronchial aspiration. No appreciable ulcer. Intrathoracic stomach is relatively decompressed with normal gastric emptying into the duodenum. IMPRESSION: 1. High-grade pinhole stricture at the esophagogastric anastomosis at the level of the thoracic inlet. No discrete mass or ulcer. 2. No residual leak at the esophagogastric anastomosis. These results will be called to the ordering clinician or representative by the Radiologist Assistant, and communication documented in the PACS or zVision Dashboard. Electronically Signed   By: Ilona Sorrel M.D.   On: 02/03/2019 09:03  Antimicrobials:   Continue vancomycin and Zosyn day 4   Subjective: Patient reports he feels well this morning Gastrografin study went well as noted above  Objective: Vitals:   02/09/19 2058 02/10/19 0438 02/10/19 0643 02/10/19 1403  BP: 121/68 118/64  129/73  Pulse: (!) 44 (!) 44  (!) 49  Resp: 18 18  15   Temp: 98 F (36.7 C) 98.2 F (36.8 C)  98.3 F (36.8 C)  TempSrc: Oral Oral  Oral  SpO2: 100% 98%  99%  Weight:   67.7 kg   Height:        Intake/Output Summary (Last 24 hours) at 02/10/2019 1430 Last data filed at 02/10/2019 0600 Gross per 24 hour  Intake 1591 ml  Output 300 ml  Net 1291 ml   Filed Weights   02/07/19 0431 02/08/19 0451 02/10/19 0643  Weight: 67 kg 67.7 kg 67.7 kg    Examination:  General exam: Appears calm and comfortable  Respiratory system: Clear to auscultation. Respiratory effort normal. Cardiovascular system: S1 & S2 heard, RRR. No JVD, murmurs, rubs, gallops or clicks. No pedal edema. Gastrointestinal system: Abdomen is nondistended, soft and nontender. No organomegaly or masses felt. Normal bowel sounds heard although quiet.  J-tube in place Central nervous system: Alert and oriented. No focal neurological deficits. Extremities: Warm well perfused. Skin: No rashes, lesions or ulcers Psychiatry: Judgement and insight appear normal. Mood & affect appropriate.     Data Reviewed: I have personally reviewed following labs and imaging studies  CBC: Recent Labs  Lab 02/06/19 1505 02/07/19 0347 02/08/19 0412 02/09/19 0523  WBC 5.8 14.7* 9.3 6.6  NEUTROABS 3.9  --  7.9* 5.2  HGB 13.1 13.0 10.8* 10.8*  HCT 42.2 41.5 34.5* 35.2*  MCV 90.8 89.8 90.8 91.2  PLT 266 271 195 0000000   Basic Metabolic Panel: Recent Labs  Lab 02/06/19 1505 02/07/19 0347 02/08/19 0412 02/09/19 0523 02/10/19 0435  NA 138 137 138 140  --   K 4.3 3.9 3.7 4.0  --   CL 103 102 106 107  --   CO2 26 23 25 28   --   GLUCOSE 88 160* 129* 128*  --   BUN 16  15 14 14   --   CREATININE 0.75 1.00 0.69 0.69 0.69  CALCIUM 9.3 8.8* 8.4* 8.5*  --   MG 2.3  --  2.3  --   --   PHOS 4.7*  --  3.1 3.8  --    GFR: Estimated Creatinine Clearance: 82.3 mL/min (by C-G formula based on SCr of 0.69 mg/dL). Liver Function Tests: Recent Labs  Lab 02/06/19 1505 02/08/19 0412 02/09/19 0523  AST 21  --   --   ALT 23  --   --   ALKPHOS 108  --   --   BILITOT 0.6  --   --   PROT 7.0  --   --   ALBUMIN 3.8 2.7* 2.7*   No results for input(s): LIPASE, AMYLASE in the last 168 hours. No results for input(s): AMMONIA in the last 168 hours. Coagulation Profile: No results for input(s): INR, PROTIME in the last 168 hours. Cardiac Enzymes: No results for input(s): CKTOTAL, CKMB, CKMBINDEX, TROPONINI in the last 168 hours. BNP (last 3 results) No results for input(s): PROBNP in the last 8760 hours. HbA1C: No results for input(s): HGBA1C in the last 72 hours. CBG: Recent Labs  Lab 02/09/19 2054 02/09/19 2355 02/10/19 0436 02/10/19 0806 02/10/19 1125  GLUCAP 87 145* 106*  112* 128*   Lipid Profile: No results for input(s): CHOL, HDL, LDLCALC, TRIG, CHOLHDL, LDLDIRECT in the last 72 hours. Thyroid Function Tests: No results for input(s): TSH, T4TOTAL, FREET4, T3FREE, THYROIDAB in the last 72 hours. Anemia Panel: No results for input(s): VITAMINB12, FOLATE, FERRITIN, TIBC, IRON, RETICCTPCT in the last 72 hours. Sepsis Labs: No results for input(s): PROCALCITON, LATICACIDVEN in the last 168 hours.  Recent Results (from the past 240 hour(s))  SARS CORONAVIRUS 2 Nasal Swab Aptima Multi Swab     Status: None   Collection Time: 02/04/19 11:03 AM   Specimen: Aptima Multi Swab; Nasal Swab  Result Value Ref Range Status   SARS Coronavirus 2 NEGATIVE NEGATIVE Final    Comment: (NOTE) SARS-CoV-2 target nucleic acids are NOT DETECTED. The SARS-CoV-2 RNA is generally detectable in upper and lower respiratory specimens during the acute phase of infection.  Negative results do not preclude SARS-CoV-2 infection, do not rule out co-infections with other pathogens, and should not be used as the sole basis for treatment or other patient management decisions. Negative results must be combined with clinical observations, patient history, and epidemiological information. The expected result is Negative. Fact Sheet for Patients: SugarRoll.be Fact Sheet for Healthcare Providers: https://www.woods-mathews.com/ This test is not yet approved or cleared by the [place] FDA and  has been authorized for detection and/or diagnosis of SARS-CoV-2 by FDA under an Emergency Use Authorization (EUA). This EUA will remain  in effect (meaning this test can be used) for the duration of the COVID-19 declaration under Section 56 4(b)(1) of the Act, 21 U.S.C. section 360bbb-3(b)(1), unless the authorization is terminated or revoked sooner. Performed at [facility], [place] [address] [place], [place] [address]   Surgical PCR screen     Status: None   Collection Time: 02/07/19  1:18 PM   Specimen: Nasal Mucosa; Nasal Swab  Result Value Ref Range Status   MRSA, PCR NEGATIVE NEGATIVE Final   Staphylococcus aureus NEGATIVE NEGATIVE Final    Comment: (NOTE) The Xpert SA Assay (FDA approved for NASAL specimens in patients 34 years of age and older), is one component of a comprehensive surveillance program. It is not intended to diagnose infection nor to guide or monitor treatment. Performed at [facility], [place] [address], [place], [place] [address]          Radiology Studies: Dg Esophagus W Single Cm (sol Or Thin Ba)  Result Date: 02/10/2019 CLINICAL DATA:  Esophageal perforation after esophageal dilatation. EXAM: ESOPHOGRAM/BARIUM SWALLOW TECHNIQUE: Single contrast examination was performed using  water-soluble. FLUOROSCOPY TIME:  Fluoroscopy Time:  1 minutes 12 seconds Radiation Exposure  Index (if provided by the fluoroscopic device): 12.9 mGy Number of Acquired Spot Images: 13 COMPARISON:  CT chest 02/06/2019, esophagram 02/03/2019. FINDINGS: There is persistent fairly high-grade narrowing in the proximal esophagus, but improved from 02/03/2019. No leak. IMPRESSION: Improved but persistent fairly high-grade narrowing in the proximal esophagus. No leak. Electronically Signed   By: Lorin Picket M.D.   On: 02/10/2019 09:56        Scheduled Meds:  feeding supplement (PRO-STAT SUGAR FREE 64)  30 mL Per Tube BID   free water  100 mL Per Tube QID   [name]'s butt cream   Topical BID   mupirocin ointment  1 application Nasal BID   Continuous Infusions:  feeding supplement (OSMOLITE 1.5 CAL) 1,000 mL (02/08/19 2119)   piperacillin-tazobactam (ZOSYN)  IV 3.375 g (02/10/19 1430)   vancomycin 750 mg (02/10/19  1037)     LOS: 3 days    Time spent: [place]    Nicolette Bang, MD [facility]  If 7PM-7AM, please contact night-coverage  02/10/2019, 2:30 PM

## 2019-02-10 NOTE — Progress Notes (Signed)
      [address]       [place] [address]             [phone]      gastrografin swallow reviewed Agree with Dr Celesta Aver assessment  [name] MD      [place] ,[place] [address] Office [phone]   [place]

## 2019-02-10 NOTE — Progress Notes (Addendum)
   Swallow w/o leak  I started clears and will round later  Probably dc Abx soon  Gatha Mayer, MD, [facility] 02/10/2019 10:36 AM Pager [phone]   Patient seen in person on rounds.  He has been taking clear liquids and doing well without pain or any difficulty.  I called pathology and the biopsy nodule is inflammatory though Dr. Melina Copa will do a cytokeratin stain for completeness we think that this is inflammation.  This is all good news but I explained that we do have concerns about progressive disease based upon the imaging.   Plan to leave him on clear liquids and if well in the morning I will discontinue his antibiotics and I think if he does okay throughout the day it would be reasonable to discharge him unless any of the other care providers see differently.  I am not inclined to perform any other interventions right now but let him heal further and hope that this dilation remains effective.  I anticipate ordering a barium swallow with thin barium and perhaps thick barium to get better definition next week and arrange follow-up.  I will coordinate that based upon how he does in the next 24 hours.  Gatha Mayer, MD, [facility] 02/10/2019 1:46 PM Pager [phone]

## 2019-02-11 ENCOUNTER — Telehealth: Payer: Self-pay | Admitting: Internal Medicine

## 2019-02-11 ENCOUNTER — Telehealth: Payer: Self-pay

## 2019-02-11 DIAGNOSIS — C160 Malignant neoplasm of cardia: Secondary | ICD-10-CM

## 2019-02-11 DIAGNOSIS — K222 Esophageal obstruction: Secondary | ICD-10-CM

## 2019-02-11 LAB — CREATININE, SERUM
Creatinine, Ser: 0.68 mg/dL (ref 0.61–1.24)
GFR calc Af Amer: >60 mL/min (ref >60)
GFR calc non Af Amer: >60 mL/min (ref >60)

## 2019-02-11 LAB — GLUCOSE, CAPILLARY
Glucose-Capillary: 102 mg/dL — ABNORMAL HIGH (ref 70–99)
Glucose-Capillary: 133 mg/dL — ABNORMAL HIGH (ref 70–99)
Glucose-Capillary: 73 mg/dL (ref 70–99)
Glucose-Capillary: 95 mg/dL (ref 70–99)

## 2019-02-11 NOTE — Discharge Summary (Signed)
Physician Discharge Summary  [name] [name] [id number] DOB: 26-Feb-1951 DOA: 02/06/2019  PCP: Binnie Rail, MD  Admit date: 02/06/2019 Discharge date: 02/11/2019  Admitted From: Inpatient Disposition: home  Recommendations for Outpatient Follow-up:  1. Follow up with PCP in 1-2 weeks 2. Follow-up with GI as discussed  Home Health:No Equipment/Devices:none  Discharge Condition:Stable CODE STATUS:Full code Diet recommendation: liquid diet only  Brief/Interim Summary: 68 year old male with prior history of esophageal cancer status post esophagogastrectomy,was found to have severe dysphagia,underwent Gastrografin swallow showing anastomotic stricture.Subsequently he underwent an endoscopy with dilatation. Post endoscopy and dilatation patient was found to have pneumomediastinum and some extravasation of contrast from the esophagus.. Patient was made n.p.o. and referred to [facility] for admission for further evaluation  Hospital course Patient underwent EGD with perforation and extravasation of contrast.  He was placed on IV vancomycin and IV Zosyn.  He was seen by both GI and cardiothoracic surgery.  Patient underwent Gastrografin study which was negative.  He is on a liquid diet which he is tolerating.  GI is cleared patient from a GI standpoint for discharge, they discontinued antibiotics with no indication, he will continue liquid diet and follow-up with them in 1 week.  Patient did undergo palliative care consultation and wants to continue with full scope of care for now.  Lower back pain: Continue patient home medication regimen. Anemia monitor patient's hemoglobin no acute indication for transfusion.  This will need to be monitored as an outpatient also  Discharge Diagnoses:  Active Problems:   Esophageal stricture   Primary adenocarcinoma of esophagogastric junction [facility])   Stricture esophagus    Discharge Instructions  Discharge Instructions    Call MD for:    Complete by: As directed    Any acute changes in medical condition to include but not limited to changes in swallowing, hematemesis, nausea vomiting.   Call MD for:  difficulty breathing, headache or visual disturbances   Complete by: As directed    Call MD for:  extreme fatigue   Complete by: As directed    Call MD for:  hives   Complete by: As directed    Call MD for:  persistant dizziness or light-headedness   Complete by: As directed    Call MD for:  persistant nausea and vomiting   Complete by: As directed    Call MD for:  severe uncontrolled pain   Complete by: As directed    Call MD for:  temperature >100.4   Complete by: As directed    Diet - low sodium heart healthy   Complete by: As directed    Discharge instructions   Complete by: As directed    Follow-up with GI, Dr [name] as discussed   Increase activity slowly   Complete by: As directed      Allergies as of 02/11/2019   No Known Allergies     Medication List    STOP taking these medications   acetaminophen 160 MG/5ML solution Commonly known as: TYLENOL   feeding supplement (PRO-STAT SUGAR FREE 64) Liqd   mupirocin cream 2 % Commonly known as: BACTROBAN     TAKE these medications   feeding supplement (OSMOLITE 1.5 CAL) Liqd Place 237 mLs into feeding tube 4 (four) times daily.   [name]'s butt cream Crea Apply 1 application topically 2 (two) times daily as needed for irritation (apply around g tube).   oxyCODONE 5 MG/5ML solution Commonly known as: ROXICODONE Take 5 mg by mouth every 6 (six) hours as  needed for severe pain.      Follow-up Information    Gatha Mayer, MD Follow up.   Specialty: Gastroenterology Why: Office will arrange - will be setting up a barium swallow test for 8/31 and office follow-up will be arranged after that if needed Contact information: [address] [address] [phone]          No Known Allergies  Consultations:  GI,  CTS   Procedures/Studies: Ct Soft Tissue Neck W Contrast  Result Date: 02/06/2019 CLINICAL DATA:  Esophagectomy. Esophagogastric ostomy. Dilatation of anastomosis today. EXAM: CT NECK WITH CONTRAST TECHNIQUE: Multidetector CT imaging of the neck was performed using the standard protocol following the bolus administration of intravenous contrast. CONTRAST:  69mL OMNIPAQUE IOHEXOL 300 MG/ML SOLN, 36mL OMNIPAQUE IOHEXOL 300 MG/ML SOLN COMPARISON:  CT neck 12/02/2018 FINDINGS: Pharynx and larynx: Normal pharynx. Epiglottis and larynx normal. Normal airway. Salivary glands: No inflammation, mass, or stone. Thyroid: Small thyroid nodules.  No mass lesion. Lymph nodes: No enlarged lymph nodes in the neck. Vascular: Normal vascular enhancement. Atherosclerotic calcification carotid bifurcation bilaterally. Limited intracranial: Negative Visualized orbits: Negative Mastoids and visualized paranasal sinuses: Bony thickening left maxillary sinus due to chronic inflammation. The remaining sinuses are clear. Skeleton: Ill-defined sclerotic lesions T2 and T4 have developed since 12/02/2018. Sclerotic lesion in the left second rib has progressed in the interval. Probable metastatic disease. No fracture. Upper chest: Extraluminal gas in the mediastinum. Small amount of gas in the right neck. Extraluminal contrast to the left of the anastomosis could be from prior extravasation or from today. The patient did receive a small amount of oral contrast. Findings compatible with mucosal tear from dilatation today. No fluid collection or abscess. Mild soft tissue thickening at the anastomosis could represent inflammation or tumor. Other: None IMPRESSION: 1. Negative for mass or adenopathy in the neck 2. Extraluminal gas in the mediastinum and right neck compatible with perforation following esophagogastroscopy dilatation today. No free fluid or abscess. 3. Sclerotic lesions at T2, T4, and left second rib have progressed since the  recent CT compatible with bony metastatic disease. Electronically Signed   By: Franchot Gallo M.D.   On: 02/06/2019 17:18   Ct Chest W Contrast  Result Date: 02/06/2019 CLINICAL DATA:  Status post esophageal dilatation with history of adenocarcinoma of the gastroesophageal junction. Value 8 for esophageal leak. EXAM: CT CHEST WITH CONTRAST TECHNIQUE: Multidetector CT imaging of the chest was performed during intravenous contrast administration. CONTRAST:  68mL OMNIPAQUE IOHEXOL 300 MG/ML SOLN, 15mL OMNIPAQUE IOHEXOL 300 MG/ML SOLN COMPARISON:  None. FINDINGS: Cardiovascular: Heart size upper normal. No substantial pericardial effusion. Atherosclerotic calcification is noted in the wall of the thoracic aorta. Mediastinum/Nodes: There is evidence of pneumomediastinum with gas in the left paraesophageal space near the proximal anastomosis and tracking anteriorly and up into the neck and down towards the abdomen. As seen on the upper GI series from 3 days ago, there is an apparent tight stricture of the esophagus near the anastomosis just below the thoracic inlet. At the level of the stricture, there is a 2.8 x 2.2 cm ill-defined soft tissue lesion that appears to essentially circumferentially surround the narrowed lumen. Gas and contrast material are identified in the extraluminal paraesophageal soft tissues in the region of the stricture(axial image 34/series 2). Gas and contrast material are seen in the mediastinum to the left of the proximal esophagus (axial 42/series 2 and coronal 70/series 6). No mediastinal lymphadenopathy. There is no hilar lymphadenopathy. There  is no axillary lymphadenopathy. Lungs/Pleura: Dependent atelectasis/infiltrate is noted in the posterior lower lobes bilaterally Upper Abdomen: 9 mm hypoattenuating lesion in the dome of the liver (116/2) is stable since CT of 10/24/2018. A larger low-density lesion in the left liver seen on that study is not evident today. Thickening of the adrenal  glands bilaterally, left greater than right is similar to prior. Prior imaging characterized these adrenal findings as adenomatous. Musculoskeletal: 10 mm sclerotic focus posterior T4 vertebral body (axial 45/2 and sagittal 86/7) not definitely seen on the prior study. There may be a subtle sclerotic lesion in the T2 vertebral body. Sclerosis in the posterior left second rib is similar to prior. IMPRESSION: 1. Pneumomediastinum is associated with extraluminal contrast to the left of the patient's known proximal esophageal stricture. Imaging features consistent with esophageal perforation, most likely at the level of the stricture. 2. Proximal esophageal stricture is in the region the anastomosis and involved by a 2.8 x 2.2 cm focus of ill-defined soft tissue. Recurrent disease a concern. 3. 10 mm subtle sclerotic focus in the posterior T4 vertebral body, not definitely seen previously and concerning for new bony metastatic disease. Potential new lesion at the T2 level. Close attention on follow-up recommended. Critical Value/emergent results were called by me at the time of interpretation on 02/06/2019 at 5:17 pm to Dr. [name] , who verbally acknowledged these results. Electronically Signed   By: Misty Stanley M.D.   On: 02/06/2019 17:18   Dg Chest Port 1 View  Result Date: 02/07/2019 CLINICAL DATA:  Esophageal perforation. EXAM: PORTABLE CHEST 1 VIEW COMPARISON:  CT scan of February 06, 2019. Radiographs of December 31, 2018. FINDINGS: Stable cardiac silhouette. Stable dilated proximal thoracic esophagus is noted. No pneumothorax is noted. Right lung is clear. Small left pleural effusion is noted with associated atelectasis or infiltrate. Bony thorax is unremarkable. IMPRESSION: Small left pleural effusion is noted which is increased compared to prior exam, with associated left basilar atelectasis or infiltrate. Electronically Signed   By: Marijo Conception M.D.   On: 02/07/2019 05:42   Dg C-arm 1-60  Min  Result Date: 02/06/2019 CLINICAL DATA:  Esophageal dilatation under fluoroscopy. EXAM: DG C-ARM 61-120 MIN COMPARISON:  Esophagram 02/03/2019 FINDINGS: Single image from patient's esophageal dilatation under fluoroscopy is submitted for review. Endoscope with wire seen over the level of the cervical/proximal esophagus crossing region of known stricture at the esophagogastric anastomosis. Recommend correlation with findings at the time of the procedure. IMPRESSION: Dilatation of known stricture at the esophagogastric anastomosis as described. Electronically Signed   By: Marin Olp M.D.   On: 02/06/2019 13:37   Dg Esophagus W Single Cm (sol Or Thin Ba)  Result Date: 02/10/2019 CLINICAL DATA:  Esophageal perforation after esophageal dilatation. EXAM: ESOPHOGRAM/BARIUM SWALLOW TECHNIQUE: Single contrast examination was performed using  water-soluble. FLUOROSCOPY TIME:  Fluoroscopy Time:  1 minutes 12 seconds Radiation Exposure Index (if provided by the fluoroscopic device): 12.9 mGy Number of Acquired Spot Images: 13 COMPARISON:  CT chest 02/06/2019, esophagram 02/03/2019. FINDINGS: There is persistent fairly high-grade narrowing in the proximal esophagus, but improved from 02/03/2019. No leak. IMPRESSION: Improved but persistent fairly high-grade narrowing in the proximal esophagus. No leak. Electronically Signed   By: Lorin Picket M.D.   On: 02/10/2019 09:56   Dg Esophagus W Single Cm (sol Or Thin Ba)  Result Date: 02/03/2019 CLINICAL DATA:  Status post complete esophagectomy with cervical esophagogastric anastomosis [id number], complicated by anastomotic leak. Patient now presents with worsening  dysphagia with sensation of food and liquid sticking in the neck. EXAM: ESOPHOGRAM/BARIUM SWALLOW TECHNIQUE: Single contrast examination was performed using water-soluble contrast initially and thin barium. FLUOROSCOPY TIME:  Fluoroscopy Time:  2 minutes 6 seconds Radiation Exposure Index (if provided by  the fluoroscopic device): 65 mGy Number of Acquired Spot Images: 13 COMPARISON:  12/16/2018 esophagram.  12/02/2018 CT neck. FINDINGS: Initial frontal and bilateral oblique scout radiographs of the neck were obtained demonstrating no soft tissue emphysema. There is no residual leak identified at the esophagogastric anastomosis at the level of the thoracic inlet. There is a high-grade pinhole anastomotic stricture at the esophagogastric anastomosis at the level of the thoracic inlet, with a high velocity jet observed with water soluble and thin barium passage through the stricture site. No discrete mass. No evidence of laryngeal penetration or tracheobronchial aspiration. No appreciable ulcer. Intrathoracic stomach is relatively decompressed with normal gastric emptying into the duodenum. IMPRESSION: 1. High-grade pinhole stricture at the esophagogastric anastomosis at the level of the thoracic inlet. No discrete mass or ulcer. 2. No residual leak at the esophagogastric anastomosis. These results will be called to the ordering clinician or representative by the Radiologist Assistant, and communication documented in the PACS or zVision Dashboard. Electronically Signed   By: Ilona Sorrel M.D.   On: 02/03/2019 09:03       Subjective: Patient doing well tolerating liquid diet Ambulating in hallways Cardiothoracic surgery reviewed biopsies explained to the patient and were negative today  Discharge Exam: Vitals:   02/11/19 0401 02/11/19 1308  BP: 119/64 132/75  Pulse: (!) 51 (!) 52  Resp: 16 16  Temp: 98.2 F (36.8 C) 98.1 F (36.7 C)  SpO2: 97% 100%   Vitals:   02/10/19 1403 02/10/19 2123 02/11/19 0401 02/11/19 1308  BP: 129/73 127/66 119/64 132/75  Pulse: (!) 49 (!) 50 (!) 51 (!) 52  Resp: 15 16 16 16   Temp: 98.3 F (36.8 C) 98.3 F (36.8 C) 98.2 F (36.8 C) 98.1 F (36.7 C)  TempSrc: Oral Oral Oral Oral  SpO2: 99% 99% 97% 100%  Weight:      Height:        General: Pt is alert,  awake, not in acute distress Cardiovascular: RRR, S1/S2 +, no rubs, no gallops Respiratory: CTA bilaterally, no wheezing, no rhonchi Abdominal: Soft, NT, ND, bowel sounds + Extremities: no edema, no cyanosis    The results of significant diagnostics from this hospitalization (including imaging, microbiology, ancillary and laboratory) are listed below for reference.     Microbiology: Recent Results (from the past 240 hour(s))  SARS CORONAVIRUS 2 Nasal Swab Aptima Multi Swab     Status: None   Collection Time: 02/04/19 11:03 AM   Specimen: Aptima Multi Swab; Nasal Swab  Result Value Ref Range Status   SARS Coronavirus 2 NEGATIVE NEGATIVE Final    Comment: (NOTE) SARS-CoV-2 target nucleic acids are NOT DETECTED. The SARS-CoV-2 RNA is generally detectable in upper and lower respiratory specimens during the acute phase of infection. Negative results do not preclude SARS-CoV-2 infection, do not rule out co-infections with other pathogens, and should not be used as the sole basis for treatment or other patient management decisions. Negative results must be combined with clinical observations, patient history, and epidemiological information. The expected result is Negative. Fact Sheet for Patients: SugarRoll.be Fact Sheet for Healthcare Providers: https://www.woods-mathews.com/ This test is not yet approved or cleared by the [place] FDA and  has been authorized for detection and/or diagnosis of  SARS-CoV-2 by FDA under an Emergency Use Authorization (EUA). This EUA will remain  in effect (meaning this test can be used) for the duration of the COVID-19 declaration under Section 56 4(b)(1) of the Act, 21 U.S.C. section 360bbb-3(b)(1), unless the authorization is terminated or revoked sooner. Performed at [facility], [place] [address] [place], [place] [address]   Surgical PCR screen     Status: None   Collection Time: 02/07/19  1:18  PM   Specimen: Nasal Mucosa; Nasal Swab  Result Value Ref Range Status   MRSA, PCR NEGATIVE NEGATIVE Final   Staphylococcus aureus NEGATIVE NEGATIVE Final    Comment: (NOTE) The Xpert SA Assay (FDA approved for NASAL specimens in patients 38 years of age and older), is one component of a comprehensive surveillance program. It is not intended to diagnose infection nor to guide or monitor treatment. Performed at [facility], [place] [address], [place], [place] [address]      Labs: BNP (last 3 results) No results for input(s): BNP in the last 8760 hours. Basic Metabolic Panel: Recent Labs  Lab 02/06/19 1505 02/07/19 0347 02/08/19 0412 02/09/19 0523 02/10/19 0435 02/11/19 0407  NA 138 137 138 140  --   --   K 4.3 3.9 3.7 4.0  --   --   CL 103 102 106 107  --   --   CO2 26 23 25 28   --   --   GLUCOSE 88 160* 129* 128*  --   --   BUN 16 15 14 14   --   --   CREATININE 0.75 1.00 0.69 0.69 0.69 0.68  CALCIUM 9.3 8.8* 8.4* 8.5*  --   --   MG 2.3  --  2.3  --   --   --   PHOS 4.7*  --  3.1 3.8  --   --    Liver Function Tests: Recent Labs  Lab 02/06/19 1505 02/08/19 0412 02/09/19 0523  AST 21  --   --   ALT 23  --   --   ALKPHOS 108  --   --   BILITOT 0.6  --   --   PROT 7.0  --   --   ALBUMIN 3.8 2.7* 2.7*   No results for input(s): LIPASE, AMYLASE in the last 168 hours. No results for input(s): AMMONIA in the last 168 hours. CBC: Recent Labs  Lab 02/06/19 1505 02/07/19 0347 02/08/19 0412 02/09/19 0523  WBC 5.8 14.7* 9.3 6.6  NEUTROABS 3.9  --  7.9* 5.2  HGB 13.1 13.0 10.8* 10.8*  HCT 42.2 41.5 34.5* 35.2*  MCV 90.8 89.8 90.8 91.2  PLT 266 271 195 193   Cardiac Enzymes: No results for input(s): CKTOTAL, CKMB, CKMBINDEX, TROPONINI in the last 168 hours. BNP: Invalid input(s): POCBNP CBG: Recent Labs  Lab 02/10/19 2124 02/11/19 0014 02/11/19 0731 02/11/19 1127 02/11/19 1157  GLUCAP 125* 102* 95 73 133*   D-Dimer No results for  input(s): DDIMER in the last 72 hours. Hgb A1c No results for input(s): HGBA1C in the last 72 hours. Lipid Profile No results for input(s): CHOL, HDL, LDLCALC, TRIG, CHOLHDL, LDLDIRECT in the last 72 hours. Thyroid function studies No results for input(s): TSH, T4TOTAL, T3FREE, THYROIDAB in the last 72 hours.  Invalid input(s): FREET3 Anemia work up No results for input(s): VITAMINB12, FOLATE, FERRITIN, TIBC, IRON, RETICCTPCT in the last 72 hours. Urinalysis    Component Value Date/Time   COLORURINE YELLOW 11/22/2018  [place] 11/22/2018 0909   LABSPEC 1.023 11/22/2018 0909   PHURINE 5.0 11/22/2018 0909   GLUCOSEU NEGATIVE 11/22/2018 0909   HGBUR NEGATIVE 11/22/2018 0909   BILIRUBINUR NEGATIVE 11/22/2018 0909   KETONESUR NEGATIVE 11/22/2018 0909   PROTEINUR NEGATIVE 11/22/2018 0909   NITRITE NEGATIVE 11/22/2018 0909   LEUKOCYTESUR NEGATIVE 11/22/2018 0909   Sepsis Labs Invalid input(s): PROCALCITONIN,  WBC,  LACTICIDVEN Microbiology Recent Results (from the past 240 hour(s))  SARS CORONAVIRUS 2 Nasal Swab Aptima Multi Swab     Status: None   Collection Time: 02/04/19 11:03 AM   Specimen: Aptima Multi Swab; Nasal Swab  Result Value Ref Range Status   SARS Coronavirus 2 NEGATIVE NEGATIVE Final    Comment: (NOTE) SARS-CoV-2 target nucleic acids are NOT DETECTED. The SARS-CoV-2 RNA is generally detectable in upper and lower respiratory specimens during the acute phase of infection. Negative results do not preclude SARS-CoV-2 infection, do not rule out co-infections with other pathogens, and should not be used as the sole basis for treatment or other patient management decisions. Negative results must be combined with clinical observations, patient history, and epidemiological information. The expected result is Negative. Fact Sheet for Patients: SugarRoll.be Fact Sheet for Healthcare  Providers: https://www.woods-mathews.com/ This test is not yet approved or cleared by the [place] FDA and  has been authorized for detection and/or diagnosis of SARS-CoV-2 by FDA under an Emergency Use Authorization (EUA). This EUA will remain  in effect (meaning this test can be used) for the duration of the COVID-19 declaration under Section 56 4(b)(1) of the Act, 21 U.S.C. section 360bbb-3(b)(1), unless the authorization is terminated or revoked sooner. Performed at [facility], [place] [address] [place], [place] [address]   Surgical PCR screen     Status: None   Collection Time: 02/07/19  1:18 PM   Specimen: Nasal Mucosa; Nasal Swab  Result Value Ref Range Status   MRSA, PCR NEGATIVE NEGATIVE Final   Staphylococcus aureus NEGATIVE NEGATIVE Final    Comment: (NOTE) The Xpert SA Assay (FDA approved for NASAL specimens in patients 4 years of age and older), is one component of a comprehensive surveillance program. It is not intended to diagnose infection nor to guide or monitor treatment. Performed at [facility], [place] [address], [place], [place] [address]      Time coordinating discharge: Over 30 minutes  SIGNED:   Nicolette Bang, MD  [facility] 02/11/2019, 2:18 PM Pager   If 7PM-7AM, please contact night-coverage [email or web address] Password [email or web address]

## 2019-02-11 NOTE — Telephone Encounter (Signed)
Patient has been scheduled for [facility] on 02/17/19  Left message for patient to call back

## 2019-02-11 NOTE — Progress Notes (Addendum)
   No fevers  Abx dc  Will check later today in person  Gatha Mayer, MD, [facility] 02/11/2019 9:12 AM Pager [phone]  Vitals:   02/10/19 2123 02/11/19 0401  BP: 127/66 119/64  Pulse: (!) 50 (!) 51  Resp: 16 16  Temp: 98.3 F (36.8 C) 98.2 F (36.8 C)  SpO2: 99% 97%    He is up walking halls and tolerating clear liquids.  He can go home.  My office will set up a barium swallow for Monday 8/31  His diet should be liquid only - does not have to be clears but would ask that he stay on liquids for now  Thanks to [facility] for your help  Gatha Mayer, MD, [facility] 02/11/2019 1:00 PM Pager [phone]

## 2019-02-11 NOTE — Discharge Instructions (Signed)
Diet should be liquids only - could have small amounts of soft noodles also.

## 2019-02-11 NOTE — Telephone Encounter (Signed)
-----   Message from Gatha Mayer, MD sent at 02/11/2019  1:02 PM EDT ----- Regarding: needs ba swallow 8/31 This patient is going home today - had esophageal perforation that healed  Needs DG esophagus thin barium 8/31  Dx esophageal stricture and esophageal cancer  NO TABLET  Thanks

## 2019-02-12 ENCOUNTER — Telehealth: Payer: Self-pay | Admitting: *Deleted

## 2019-02-12 NOTE — Telephone Encounter (Signed)
Patient's wife notified of the BS on 02/17/19 [facility] .  She is asked that he arrive at 10:45 and be NPO after 8 am.

## 2019-02-12 NOTE — Telephone Encounter (Signed)
See other phone note for details. 

## 2019-02-12 NOTE — Progress Notes (Signed)
Patient aware.

## 2019-02-12 NOTE — Telephone Encounter (Signed)
Patient discharged from hospital 02/11/19 for Esophageal stricture and has primary adenocarcinoma of esophagogastric junction. Discharge instructions state to follow up with Gatha Mayer, MD (Gastroenterology); Office will arrange - will be setting up a barium swallow test for 8/31 and office follow-up will be arranged after that if needed

## 2019-02-12 NOTE — Telephone Encounter (Signed)
Left message for patient to call back  

## 2019-02-13 LAB — GLUCOSE, CAPILLARY: Glucose-Capillary: 136 mg/dL — ABNORMAL HIGH (ref 70–99)

## 2019-02-17 ENCOUNTER — Other Ambulatory Visit: Payer: Self-pay

## 2019-02-17 ENCOUNTER — Ambulatory Visit (HOSPITAL_COMMUNITY)
Admission: AD | Admit: 2019-02-17 | Discharge: 2019-02-17 | Disposition: A | Discharge status: Home / Self Care | Payer: Medicare Other | Source: Home / Self Care | Attending: Internal Medicine | Admitting: Internal Medicine

## 2019-02-17 DIAGNOSIS — K222 Esophageal obstruction: Secondary | ICD-10-CM

## 2019-02-17 DIAGNOSIS — C160 Malignant neoplasm of cardia: Secondary | ICD-10-CM

## 2019-02-17 NOTE — Progress Notes (Signed)
Unable to reach him re: results Will try tomorrow

## 2019-02-18 NOTE — Progress Notes (Signed)
Have tried multiple times to call but no answer/busy He sees Dr. Servando Snare 9/3 The stricture looks more open to me Will try to reach him to discuss and/or wait to hear from dr. Servando Snare Would be in favor of continuing liquid diet if tolerated, for now and not dilating again for at least a while if we can get away with that

## 2019-02-20 ENCOUNTER — Other Ambulatory Visit: Payer: Self-pay

## 2019-02-20 ENCOUNTER — Ambulatory Visit (INDEPENDENT_AMBULATORY_CARE_PROVIDER_SITE_OTHER): Payer: Self-pay | Admitting: Cardiothoracic Surgery

## 2019-02-20 VITALS — BP 150/80 | HR 74 | Temp 97.6°F | Resp 20 | Ht 71.0 in | Wt 147.0 lb

## 2019-02-20 DIAGNOSIS — C160 Malignant neoplasm of cardia: Secondary | ICD-10-CM

## 2019-02-20 DIAGNOSIS — Z09 Encounter for follow-up examination after completed treatment for conditions other than malignant neoplasm: Secondary | ICD-10-CM

## 2019-02-21 NOTE — Progress Notes (Signed)
[address]       [place] [address]             [phone]                  [place] [facility] Medical Record [id number] Date of Birth: Jun 26, 1950  Referring BP:[id number], [name], MD Primary Cardiology: Primary Care:Burns, [name], MD  Chief Complaint:  Follow Up Visit  Cancer Staging Adenocarcinoma of gastroesophageal junction [facility]) Staging form: Esophagus - Adenocarcinoma, AJCC 8th Edition - Pathologic stage from 11/28/2018: Stage IVA (ypT4a, pN2, cM0, G2) - Signed by [name], MD on 12/01/2018  OPERATIVE REPORT DATE OF PROCEDURE:  11/25/2018 PREOPERATIVE DIAGNOSIS:  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 SURGICAL PROCEDURE:  Video bronchoscopy, transhiatal total esophagectomy with cervical esophagogastrostomy, pyloroplasty, feeding jejunostomy, bilateral placement of chest tubes. SURGEON:  [name], MD  History of Present Illness:     Since last seen in the office the patient had called noting having increasing difficulty swallowing.  A repeat barium study was performed demonstrating high-grade anastomotic lesion of the chest at this esophageal gastric anastomosis patient was admitted to [facility] 2 weeks ago and esophageal dilatation was performed unfortunately this resulted in evidence of mediastinal wiring concern for perforation he was managed nonsurgically kept n.p.o. for several days continued on tube feedings follow-up swallow showed no evidence of leakage he was discharged home.  He notes that he has been doing relatively well at home continuing on tube feedings mostly around the clock.  He notes that he still has difficulty swallowing anything other than liquids.  Biopsies done at the time of the dilatation all came back without evidence of malignancy.  His major goal is to be able to return  to eating  Zubrod Score: At the time of surgery this patient's most appropriate activity status/level should be described as: []     0    Normal activity, no symptoms []     1    Restricted in physical strenuous activity but ambulatory, able to do out light work [x]     2    Ambulatory and capable of self care, unable to do work activities, up and about                 >50 % of waking hours                                                                                   []     3    Only limited self care, in bed greater than 50% of waking hours []     4    Completely disabled, no self care, confined to bed or chair []     5    Moribund  Social History   Tobacco Use  Smoking Status Former Smoker  . Packs/day: 0.25  . Years: 50.00  . Pack years: 12.50  . Types: Cigarettes  . Quit date: 06/19/2018  . Years since quitting: 0.6  Smokeless Tobacco Never Used  Tobacco Comment   06/02/14 2-3 pp  week       No Known Allergies  Current Outpatient Medications  Medication Sig Dispense Refill  . Hydrocortisone (Alla Sloma'S BUTT CREAM) CREA Apply 1 application topically 2 (two) times daily as needed for irritation (apply around g tube). 1 each 1  . Nutritional Supplements (FEEDING SUPPLEMENT, OSMOLITE 1.5 CAL,) LIQD Place 237 mLs into feeding tube 4 (four) times daily.    Marland Kitchen oxyCODONE (ROXICODONE) 5 MG/5ML solution Take 5 mg by mouth every 6 (six) hours as needed for severe pain.     No current facility-administered medications for this visit.    Facility-Administered Medications Ordered in Other Visits  Medication Dose Route Frequency Provider Last Rate Last Dose  . alum & mag hydroxide-simeth (MAALOX/MYLANTA) 200-200-20 MG/5ML suspension 30 mL  30 mL Oral Once Harle Stanford., PA-C       And  . lidocaine (XYLOCAINE) 2 % viscous mouth solution 15 mL  15 mL Oral Once Harle Stanford., PA-C           Physical Exam: BP (!) 150/80   Pulse 74   Temp 97.6 F (36.4 C) (Skin)   Resp 20   Ht 5'  11" (1.803 m)   Wt 147 lb (66.7 kg)   SpO2 97% Comment: RA  BMI 20.50 kg/m   General appearance: alert, cooperative and no distress Head: Normocephalic, without obvious abnormality, atraumatic Neck: no adenopathy, no carotid bruit, no JVD, supple, symmetrical, trachea midline and thyroid not enlarged, symmetric, no tenderness/mass/nodules Lymph nodes: Cervical, supraclavicular, and axillary nodes normal. Resp: clear to auscultation bilaterally Cardio: regular rate and rhythm, S1, S2 normal, no murmur, click, rub or gallop Extremities: extremities normal, atraumatic, no cyanosis or edema and Homans sign is negative, no sign of DVT Neurologic: Grossly normal  Neck incision is now healed without drainage. J-tube has some skin irritation around it but does not appear infected   Diagnostic Studies & Laboratory data:         Recent Radiology Findings: Dg Chest 2 View  Result Date: 12/31/2018 CLINICAL DATA:  Adenocarcinoma of the gastroesophageal junction. EXAM: CHEST - 2 VIEW COMPARISON:  December 04, 2018 FINDINGS: The heart size and mediastinal contours are within normal limits. Patchy consolidation of left lung base is identified, improved compared to prior exam. The previously noted pneumothorax in the right lung is not seen on current exam. There is no pulmonary edema. The visualized skeletal structures are unremarkable. IMPRESSION: Previously noted right pneumothorax is not seen on current exam. Patchy consolidation of left lung base is identified, improved compared prior exam. Electronically Signed   By: Abelardo Diesel M.D.   On: 12/31/2018 15:08   I   I have independently reviewed the above radiology findings and reviewed findings  with the patient.  Recent Labs: Lab Results  Component Value Date   WBC 6.6 02/09/2019   HGB 10.8 (L) 02/09/2019   HCT 35.2 (L) 02/09/2019   PLT 193 02/09/2019   GLUCOSE 128 (H) 02/09/2019   CHOL 161 05/06/2018   TRIG 134.0 05/06/2018   HDL 54.20  05/06/2018   LDLDIRECT 131.7 12/27/2009   LDLCALC 80 05/06/2018   ALT 23 02/06/2019   AST 21 02/06/2019   NA 140 02/09/2019   K 4.0 02/09/2019   CL 107 02/09/2019   CREATININE 0.68 02/11/2019   BUN 14 02/09/2019   CO2 28 02/09/2019   TSH 1.24 05/06/2018   INR 1.1 11/22/2018   HGBA1C 5.4 05/06/2018   Wt Readings from Last 3 Encounters:  02/20/19 147 lb (66.7 kg)  02/10/19 149 lb 4 oz (67.7 kg)  01/20/19 152 lb 1.6 oz (69 kg)     Assessment / Plan:   Patient with advanced stage esophageal cancer and even after resection has both proximal and distal margins positive consideration for further chemotherapy has been discussed with the patient both by Dr. Benay Spice and at [facility] but has been decided not to proceed with that at this time follow-up swallow done yesterday again shows no leak but restenosis at the area of the anastomosis the films have been reviewed with the patient breathing knowing the risks and difficulties that he had with the dilatation he notes that he is willing to risk further attempts at dilatation so he can return to eating I suggested to him that we wait several more weeks of healing and then re-discussed the case with Dr. [name] to consider repeat dilatation will plan to see the patient back in 2 weeks until that time he will continue on full jejunal tube feedings       [name] 02/21/2019 4:26 PM

## 2019-02-25 ENCOUNTER — Telehealth: Payer: Self-pay | Admitting: Licensed Clinical Social Worker

## 2019-02-25 ENCOUNTER — Inpatient Hospital Stay (HOSPITAL_BASED_OUTPATIENT_CLINIC_OR_DEPARTMENT_OTHER): Payer: Medicare Other | Admitting: Oncology

## 2019-02-25 ENCOUNTER — Inpatient Hospital Stay: Payer: Medicare Other | Attending: Nurse Practitioner

## 2019-02-25 ENCOUNTER — Other Ambulatory Visit: Payer: Self-pay

## 2019-02-25 VITALS — BP 136/77 | HR 55 | Temp 98.9°F | Resp 17 | Ht 71.0 in | Wt 146.7 lb

## 2019-02-25 DIAGNOSIS — C155 Malignant neoplasm of lower third of esophagus: Secondary | ICD-10-CM | POA: Insufficient documentation

## 2019-02-25 DIAGNOSIS — R4702 Dysphasia: Secondary | ICD-10-CM | POA: Insufficient documentation

## 2019-02-25 DIAGNOSIS — E785 Hyperlipidemia, unspecified: Secondary | ICD-10-CM | POA: Insufficient documentation

## 2019-02-25 DIAGNOSIS — D509 Iron deficiency anemia, unspecified: Secondary | ICD-10-CM | POA: Diagnosis not present

## 2019-02-25 DIAGNOSIS — I10 Essential (primary) hypertension: Secondary | ICD-10-CM | POA: Diagnosis not present

## 2019-02-25 DIAGNOSIS — C160 Malignant neoplasm of cardia: Secondary | ICD-10-CM

## 2019-02-25 DIAGNOSIS — Z86010 Personal history of colonic polyps: Secondary | ICD-10-CM

## 2019-02-25 NOTE — Progress Notes (Signed)
[facility] OFFICE PROGRESS NOTE   Diagnosis: Esophagus cancer  INTERVAL HISTORY:    [name] was discharged from hospital 02/11/2019 after admission for dilatation of anastomotic stricture.  He developed pneumomediastinum and contrast extravasation from the esophagus.  He was placed on antibiotics and made n.p.o.  A repeat Gastrografin study showed no esophageal leak.  He was advanced to a liquid diet and discharged home on 02/11/2019.  He continues to have dysphasia.  He is tolerating a liquid diet.  He is using the feeding tube for most of his nutrition.  He is scheduled to see Dr. Servando Snare next week.  No other complaint.  Objective:  Vital signs in last 24 hours:  Blood pressure 136/77, pulse (!) 55, temperature 98.9 F (37.2 C), temperature source Temporal, resp. rate 17, height '5\' 11"'  (1.803 m), weight 146 lb 11.2 oz (66.5 kg), SpO2 100 %.    HEENT: Neck without mass, healed surgical incision at the left lower neck Lymphatics: No cervical, supraclavicular, axillary, or inguinal nodes GI: No hepatomegaly, no mass, nontender, left upper quadrant feeding tube site with a gauze dressing Vascular: No leg edema   Lab Results:  Lab Results  Component Value Date   WBC 6.6 02/09/2019   HGB 10.8 (L) 02/09/2019   HCT 35.2 (L) 02/09/2019   MCV 91.2 02/09/2019   PLT 193 02/09/2019   NEUTROABS 5.2 02/09/2019    CMP  Lab Results  Component Value Date   NA 140 02/09/2019   K 4.0 02/09/2019   CL 107 02/09/2019   CO2 28 02/09/2019   GLUCOSE 128 (H) 02/09/2019   BUN 14 02/09/2019   CREATININE 0.68 02/11/2019   CALCIUM 8.5 (L) 02/09/2019   PROT 7.0 02/06/2019   ALBUMIN 2.7 (L) 02/09/2019   AST 21 02/06/2019   ALT 23 02/06/2019   ALKPHOS 108 02/06/2019   BILITOT 0.6 02/06/2019   GFRNONAA >60 02/11/2019   GFRAA >60 02/11/2019     Medications: I have reviewed the patient's current medications.   Assessment/Plan: 1. Adenocarcinoma of the distal  esophagus/GE junction/gastric cardia ? Upper endoscopy 07/25/2018-esophageal mass at 38-41 cm extending to the GE junction and gastric cardia, biopsy confirmed adenocarcinoma ? CTs 08/01/2018-wall thickening at the distal esophagus extending to the GE junction and gastric cardia, gastrohepatic ligament lymphadenopathy, perigastric lymph node, too small to characterize liver lesion, bilateral adrenal adenomas ? PET scan 08/09/2018-hypermetabolic mass spanning the gastroesophageal junction. Clustered gastrohepatic ligament lymph nodes likely involved with maximum SUV 3.2. ? Radiation 08/21/2018-09/27/2018  ? Cycle 1 weekly Taxol/carboplatin 08/21/2018 ? Cycle 2 weekly Taxol/carboplatin 08/27/2018 ? Cycle 3 weekly Taxol/carboplatin 09/03/2018 ? Cycle 4 weekly Taxol/carboplatin 09/10/2018 ? Cycle 5 weekly Taxol/carboplatin 09/17/2018 ? CTs 10/24/2018-gastric cardia portion of the gastroesophageal mass less striking than on the 08/09/2018 exam. Adjacent gastrohepatic ligament adenopathy is still present with clustered nodes measuring 1.2 cm. Aortocaval node measures 0.8 cm, formerly the same, not formally hypermetabolic. ? Esophagogastrectomy 11/25/2018-moderate to poorly differentiated adenocarcinoma measuring at least 20 cm involving the distal esophagus, GE junction, and proximal stomach. Tumor midpoint and the proximal stomach. Tumor invades the visceral peritoneum. Proximal and distal resection margins are positive, 5/13 lymph nodes positive, lymphovascular and perineural invasion present, absent treatment effect, pT4a,pN2;foundation 1-microsatellite stable, tumor mutational burden 1, BRCA2; PDL 1 combined positive score 1;HER-2/neu negative by IHC ? CT neck 02/06/2019- extraluminal gas in the mediastinum and right neck, sclerotic lesions at T2, T4, and the left second rib consistent with bone metastases ? CT chest 02/06/2019-  pneumomediastinum, proximal esophageal stricture with ill-defined soft tissue,  sclerotic lesion at T4, new?Marland Kitchen  A potential new lesion at T2  2. Iron deficiency anemia secondary to #1,improved 3. Colon polyps, tubular adenomas, identified on colonoscopy 07/25/2018 4. Hypertension 5. Hyperlipidemia 6. Tobacco use 7. 02/06/2019 -hospital admission for pneumomediastinum after undergoing an upper endoscopy/dilatation and esophageal biopsy-ulcerated squamous mucosa with no malignancy identified, antibiotics/n.p.o.-repeat esophagram 02/10/2019-persistent narrowing in the proximal esophagus, no leak  Esophagram 02/17/2019-high-grade stricture at the esophagogastric anastomosis, no leak    Disposition: [name] has a history of gastroesophageal cancer.  He was recently admitted with an esophageal perforation after an endoscopic dilatation procedure.  There is no clinical evidence for an ongoing esophageal leak.  He has persistent dysphagia.  He plans to see Dr. Servando Snare and [name] to discuss a repeat attempt at esophageal dilation.  There is no clinical evidence for progression of the cancer.  He will return for an office visit in 6 weeks.  We will consider systemic treatment options when he develops clear evidence of disease progression. He has been referred to the genetics counselors to look for a germline BRCA2 mutation. Betsy Coder, MD  02/25/2019  3:21 PM

## 2019-02-26 NOTE — Telephone Encounter (Signed)
Scheduled genetic counseling appointment with me 9/10 at 2 pm. He will come in for an in person visit.

## 2019-02-27 ENCOUNTER — Other Ambulatory Visit: Payer: Self-pay

## 2019-02-27 ENCOUNTER — Telehealth: Payer: Self-pay | Admitting: Oncology

## 2019-02-27 ENCOUNTER — Encounter: Payer: Self-pay | Admitting: Licensed Clinical Social Worker

## 2019-02-27 ENCOUNTER — Inpatient Hospital Stay (HOSPITAL_BASED_OUTPATIENT_CLINIC_OR_DEPARTMENT_OTHER): Payer: Medicare Other | Admitting: Licensed Clinical Social Worker

## 2019-02-27 DIAGNOSIS — Z809 Family history of malignant neoplasm, unspecified: Secondary | ICD-10-CM

## 2019-02-27 DIAGNOSIS — C160 Malignant neoplasm of cardia: Secondary | ICD-10-CM | POA: Diagnosis not present

## 2019-02-27 DIAGNOSIS — Z1379 Encounter for other screening for genetic and chromosomal anomalies: Secondary | ICD-10-CM | POA: Insufficient documentation

## 2019-02-27 NOTE — Progress Notes (Signed)
REFERRING PROVIDER: Ladell Pier, MD [address] [place],  [place] [address]  PRIMARY PROVIDER:  Binnie Rail, MD  PRIMARY REASON FOR VISIT:  1. Adenocarcinoma of gastroesophageal junction ([place])   2. Family history of cancer      HISTORY OF PRESENT ILLNESS:   [name], a 68 y.o. male, was seen for a [facility] [place] cancer genetics consultation at the request of Dr. Benay Spice due to his Foundation One testing that revealed a BRCA2 mutation in his tumor.  [name] [name] presents to clinic today to discuss the possibility of a hereditary predisposition to cancer, genetic testing, and to further clarify his future cancer risks, as well as potential cancer risks for family members.   In 2020, at the age of 3, [name] [name] was diagnosed with adenocarcinona of the gastroesophageal junction. His treatment plan included chemotherapy, radiation and surgery. He notes he is not doing well after the surgery and feels frustrated that he cannot eat or drink on his own.   He has had 4 colonoscopies. On his 2010 colonoscopy, a fragment of adenoma and tubulovillous adenoma was identified. On his 2020 colonoscopy, 2 tubular adenomas were identified. He reports he has not had prostate cancer screening.    CANCER HISTORY:  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[place])  07/29/2018 Initial Diagnosis   Adenocarcinoma of gastroesophageal junction ([place])   08/21/2018 -  Chemotherapy   The patient had palonosetron (ALOXI) injection 0.25 mg, 0.25 mg, Intravenous,  Once, 1 of 1 cycle Administration: 0.25 mg (08/21/2018), 0.25 mg (08/27/2018), 0.25 mg (09/03/2018), 0.25 mg (09/10/2018), 0.25 mg (09/17/2018) CARBOplatin (PARAPLATIN) 200 mg in sodium chloride 0.9 % 250 mL chemo infusion, 200 mg (98.5 % of original dose 204.4 mg), Intravenous,  Once, 1 of 1 cycle Dose modification:   (original dose 204.4 mg, Cycle 1) Administration: 200 mg (08/21/2018), 200 mg (08/27/2018), 200 mg (09/03/2018), 200 mg  (09/10/2018), 200 mg (09/17/2018) PACLitaxel (TAXOL) 102 mg in sodium chloride 0.9 % 250 mL chemo infusion (</= 33m/m2), 50 mg/m2 = 102 mg, Intravenous,  Once, 1 of 1 cycle Administration: 102 mg (08/21/2018), 102 mg (08/27/2018), 102 mg (09/03/2018), 102 mg (09/10/2018), 102 mg (09/17/2018)  for chemotherapy treatment.    11/28/2018 Cancer Staging   Staging form: Esophagus - Adenocarcinoma, AJCC 8th Edition - Pathologic stage from 11/28/2018: Stage IVA (ypT4a, pN2, cM0, G2) - Signed by GGrace Isaac MD on 12/01/2018     Past Medical History:  Diagnosis Date  . [place]  . Anemia   . Diverticulosis   . Family history of cancer   . Hyperlipidemia   . Hypertension   . Pre-diabetes    but patient's wife states he has never been told that  . Tobacco abuse     Past Surgical History:  Procedure Laterality Date  . BALLOON DILATION N/A 02/06/2019   Procedure: BALLOON DILATION;  Surgeon: GGatha Mayer MD;  [facility];  Service: Endoscopy;  Laterality: N/A;  . BIOPSY  02/06/2019   Procedure: BIOPSY;  Surgeon: GGatha Mayer MD;  [facility];  Service: Endoscopy;;  . COLONOSCOPY    . COMPLETE ESOPHAGECTOMY N/A 11/25/2018   Procedure: TRANSHIATAL TOTAL ESOPHAGECTOMY COMPLETE WITH CERVICAL ESOPHAGOGASTROSTOMY;  Surgeon: GGrace Isaac MD;  Location: [facility] OR;  Service: Thoracic;  Laterality: N/A;  . ESOPHAGOGASTRODUODENOSCOPY (EGD) WITH PROPOFOL N/A 02/06/2019   Procedure: ESOPHAGOGASTRODUODENOSCOPY (EGD) WITH PROPOFOL;  Surgeon: GGatha Mayer MD;  Location: [facility];  Service:  Endoscopy;  Laterality: N/A;  needs fluoro  . IR REPLC DUODEN/JEJUNO TUBE PERCUT W/FLUORO  12/20/2018  . JEJUNOSTOMY N/A 11/25/2018   Procedure: FEEDING JEJUNOSTOMY;  Surgeon: [name], MD;  Location: [facility];  Service: Thoracic;  Laterality: N/A;  . PARASTERNAL EXPLORATION Left 12/02/2018   Procedure: LEFT NECK EXPLORATION;  Surgeon:  [name], MD;  Location: [facility];  Service: Thoracic;  Laterality: Left;  . PYLOROPLASTY  11/25/2018   Procedure: Pyloroplasty;  Surgeon: [name], MD;  Location: [facility];  Service: Thoracic;;  . VIDEO BRONCHOSCOPY N/A 11/25/2018   Procedure: VIDEO BRONCHOSCOPY;  Surgeon: [name], MD;  Location: [facility];  Service: Thoracic;  Laterality: N/A;  . WISDOM TOOTH EXTRACTION      Social History   Socioeconomic History  . Marital status: Married    Spouse name: Adele  . Number of children: 0  . Years of education: Not on file  . Highest education level: Not on file  Occupational History  . Occupation: retired  Scientific laboratory technician  . Financial resource strain: Not on file  . Food insecurity    Worry: Not on file    Inability: Not on file  . Transportation needs    Medical: Not on file    Non-medical: Not on file  Tobacco Use  . Smoking status: Former Smoker    Packs/day: 0.25    Years: 50.00    Pack years: 12.50    Types: Cigarettes    Quit date: 06/19/2018    Years since quitting: 0.6  . Smokeless tobacco: Never Used  . Tobacco comment: 06/02/14 2-3 pp week  Substance and Sexual Activity  . Alcohol use: Yes    Alcohol/week: 2.0 standard drinks    Types: 2 Glasses of wine per week    Comment:  occasionally- None for last 6 months  . Drug use: No  . Sexual activity: Not Currently  Lifestyle  . Physical activity    Days per week: Not on file    Minutes per session: Not on file  . Stress: Not on file  Relationships  . Social Herbalist on phone: Not on file    Gets together: Not on file    Attends religious service: Not on file    Active member of club or organization: Not on file    Attends meetings of clubs or organizations: Not on file    Relationship status: Not on file  Other Topics Concern  . Not on file  Social History Narrative   Retired   Psychologist, counselling for exercise.   Working for [employer] [phone]). Enjoys volunteering.    Former  smoker, rare EtOH     FAMILY HISTORY:  We obtained a detailed, 4-generation family history.  Significant diagnoses are listed below: Family History  Problem Relation Age of Onset  . Cancer Mother        bone cancer  . Heart attack Father 79  . Cancer Sister 41       unk type  . Heart attack Paternal Uncle        X2, both < 55  . COPD Neg Hx   . Diabetes Neg Hx   . Stroke Neg Hx   . Colon cancer Neg Hx    Mr. Hosier does not have children. He has 2 sisters and 1 brother. One of his sisters had cancer and died at 7, he is unsure the type. His other sister is  living at 12 with no cancers. His brother is living at 72 with no cancers. No cancers in his nieces/nephews.  Mr. Byington mother had bone cancer "later in life", he is unsure exact age. Patient had 2 maternal aunts, 1 maternal uncle. No cancers for these individuals. No known cancers in maternal cousins. His maternal grandparents both died at older ages, but unsure exact age, no cancer diagnoses he is aware of.  Mr. Vicencio father died at 53 of a heart attack. Patient had 2 paternal uncles who died at 82 and 55 of heart attacks. Patient also has 3 paternal aunts, all still living, no cancers. No known cancers in paternal cousins. His paternal grandparents both died in their 54s, no cancers.   Mr. Cedrone is unaware of previous family history of genetic testing for hereditary cancer risks. Patient's maternal ancestors are of [place] descent, and paternal ancestors are of [place] descent. There is no reported Ashkenazi Jewish ancestry. There is no known consanguinity.  GENETIC COUNSELING ASSESSMENT: Mr. Ducksworth is a 68 y.o. male with Foundation One tumor testing that revealed a BRCA2 mutation. We, therefore, discussed and recommended the following at today's visit.   DISCUSSION: We discussed that 5 - 10% of cancer is hereditary. The type of cancer he has is generally not considered to be hereditary and his family history is not suggestive of  a hereditary cancer syndrome,  but his tumor testing did reveal a BRCA2 mutation which suggests he may have been born with a mutation in this gene. It is also possible this mutation is just in the tumor itself.  We discussed that testing is beneficial for several reasons including  knowing how to follow individuals after completing their treatment, and understanding if other family members could be at risk for cancer and allow them to undergo genetic testing.   We reviewed the characteristics, features and inheritance patterns of hereditary cancer syndromes. We also discussed genetic testing, including the appropriate family members to test, the process of testing, insurance coverage and turn-around-time for results. We discussed the implications of a negative, positive and/or variant of uncertain significant result. We recommended Mr. Tuzzolino pursue genetic testing for the [facility] CancerNext gene panel.   The CancerNext gene panel offered by [employer] includes sequencing and rearrangement analysis for the following 34 genes:   APC, ATM, BARD1, BMPR1A, BRCA1, BRCA2, BRIP1, CDH1, CDK4, CDKN2A, CHEK2, DICER1, EPCAM, GREM1, HOXB13, MLH1, MRE11A, MSH2, MSH6, MUTYH, NBN, NF1, PALB2, PMS2, POLD1, POLE, PTEN, RAD50, RAD51C, RAD51D, SMAD4, SMARCA4, STK11, and TP53.   Based on Mr. Prestage tumor testing he meets medical criteria for genetic testing. Despite that he meets criteria, he may still have an out of pocket cost.    PLAN: Despite our recommendation, Mr. Lover did not wish to pursue genetic testing at today's visit.  We understand this decision and remain available to coordinate genetic testing at any time in the future. We, therefore, recommend Mr. Googe continue to follow the cancer screening guidelines given by his primary healthcare provider.  Lastly, we encouraged Mr. Shellhammer to remain in contact with cancer genetics annually so that we can continuously update the family history and inform him of any  changes in cancer genetics and testing that may be of benefit for this family.   Mr. Freid questions were answered to his satisfaction today. Our contact information was provided should additional questions or concerns arise. Thank you for the referral and allowing [place] to share in the care of your patient.  Faith Rogue, MS, [facility] Genetic Counselor [email or web address] Phone: [phone]  The patient was seen for a total of 25 minutes in face-to-face genetic counseling.  Drs. Magrinat, Lindi Adie and/or Burr Medico were available for discussion regarding this case.   _______________________________________________________________________ For Office Staff:  Number of people involved in session: 1 Was an Intern/ student involved with case: no

## 2019-02-27 NOTE — Telephone Encounter (Signed)
Called and spoke with patient. Confirmed appt  °

## 2019-02-28 NOTE — Progress Notes (Signed)
I called him today and he is unable to swallow at all again.  He is asking for attempt to dilate again and understands it could be lethal  I think a stent will make the most sense  Will f/u with him by Monday about this

## 2019-03-03 NOTE — Progress Notes (Signed)
I called again to suggest dilation and stent placement.  He is having pain at J tube site - slightly red and no discharge and it is functioning ok.  He has appointment 9/16 w/ Dr. Servando Snare and I was considering attempting stent placement that day but will hold off until he has f/u and reassessment of tube.  In our discussion I told him I thought attempt at stenting best option to allow longer-term palliation of dysphagia. He is interested in eating sandwiches again and I was not optimistic about that and with an 18 mm diameter stent would not recommend he do so.  Will regroup after 9/16 visit w/ Dr. Kathyrn Lass

## 2019-03-04 ENCOUNTER — Other Ambulatory Visit: Payer: Self-pay | Admitting: Cardiothoracic Surgery

## 2019-03-04 MED ORDER — OXYCODONE HCL 5 MG/5ML PO SOLN: 5.0000 mg | Freq: Four times a day (QID) | ORAL | 0 refills | Status: AC | PRN

## 2019-03-05 ENCOUNTER — Ambulatory Visit: Payer: Medicare Other | Admitting: Cardiothoracic Surgery

## 2019-03-05 ENCOUNTER — Other Ambulatory Visit: Payer: Self-pay

## 2019-03-05 VITALS — BP 145/80 | HR 54 | Temp 97.5°F | Resp 20 | Ht 71.0 in | Wt 143.0 lb

## 2019-03-05 DIAGNOSIS — C160 Malignant neoplasm of cardia: Secondary | ICD-10-CM

## 2019-03-05 DIAGNOSIS — Z09 Encounter for follow-up examination after completed treatment for conditions other than malignant neoplasm: Secondary | ICD-10-CM | POA: Diagnosis not present

## 2019-03-05 NOTE — Progress Notes (Signed)
[address]       [place] [address]             [phone]                  [facility] Medical Record [id number] Date of Birth: 02-04-1951  Referring [id number], [name], MD Primary Cardiology: Primary Care:Burns, [name], MD  Chief Complaint:  Follow Up Visit  Cancer Staging Adenocarcinoma of gastroesophageal junction [facility]) Staging form: Esophagus - Adenocarcinoma, AJCC 8th Edition - Pathologic stage from 11/28/2018: Stage IVA (ypT4a, pN2, cM0, G2) - Signed by [name], MD on 12/01/2018  OPERATIVE REPORT DATE OF PROCEDURE:  11/25/2018 PREOPERATIVE DIAGNOSIS:  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 SURGICAL PROCEDURE:  Video bronchoscopy, transhiatal total esophagectomy with cervical esophagogastrostomy, pyloroplasty, feeding jejunostomy, bilateral placement of chest tubes. SURGEON:  [name], MD  History of Present Illness:     Patient returns office today for follow-up visit. At barium study was performed demonstrating high-grade anastomotic lesion of the chest at this esophageal gastric anastomosis patient was admitted to [facility] 4 weeks ago and esophageal dilatation was performed unfortunately this resulted in evidence of mediastinal wiring concern for perforation he was managed nonsurgically kept n.p.o. for several days continued on tube feedings follow-up swallow showed no evidence of leakage he was discharged home. He continues to do relatively well at home on continuous tube feedings, but has continued to lose several pounds in weight.  He is back at the point he is unable to even swallow liquids.  He had some irritation around his jejunostomy tube, but does not appear to be grossly infected   Biopsies done at the time of the dilatation all came back without evidence of malignancy    His major goal is to be able to return to eating  Zubrod Score: At the time of surgery this patient's most appropriate activity status/level should be described as: []     0    Normal activity, no symptoms []     1    Restricted in physical strenuous activity but ambulatory, able to do out light work [x]     2    Ambulatory and capable of self care, unable to do work activities, up and about                 >50 % of waking hours                                                                                   []     3    Only limited self care, in bed greater than 50% of waking hours []     4    Completely disabled, no self care, confined to bed or chair []     5    Moribund  Social History   Tobacco Use  Smoking Status Former Smoker  . Packs/day: 0.25  . Years: 50.00  . Pack years: 12.50  . Types: Cigarettes  . Quit date: 06/19/2018  . Years since quitting: 0.7  Smokeless  Tobacco Never Used  Tobacco Comment   06/02/14 2-3 pp week       No Known Allergies  Current Outpatient Medications  Medication Sig Dispense Refill  . Hydrocortisone (Skylah Delauter'S BUTT CREAM) CREA Apply 1 application topically 2 (two) times daily as needed for irritation (apply around g tube). 1 each 1  . Nutritional Supplements (FEEDING SUPPLEMENT, OSMOLITE 1.5 CAL,) LIQD Place 237 mLs into feeding tube 4 (four) times daily.    Marland Kitchen oxyCODONE (ROXICODONE) 5 MG/5ML solution Take 5 mLs (5 mg total) by mouth every 6 (six) hours as needed for up to 7 days for severe pain. 473 mL 0   No current facility-administered medications for this visit.    Facility-Administered Medications Ordered in Other Visits  Medication Dose Route Frequency Provider Last Rate Last Dose  . alum & mag hydroxide-simeth (MAALOX/MYLANTA) 200-200-20 MG/5ML suspension 30 mL  30 mL Oral Once Harle Stanford., PA-C       And  . lidocaine (XYLOCAINE) 2 % viscous mouth solution 15 mL  15 mL Oral Once Harle Stanford., PA-C           Physical Exam:  BP (!) 145/80   Pulse (!) 54   Temp (!) 97.5 F (36.4 C) (Skin)   Resp 20   Ht 5\' 11"  (1.803 m)   Wt 143 lb (64.9 kg)   SpO2 97%   BMI 19.94 kg/m   General appearance: alert, cooperative and cachectic Neck: no adenopathy, no carotid bruit, no JVD, supple, symmetrical, trachea midline and thyroid not enlarged, symmetric, no tenderness/mass/nodules Lymph nodes: Cervical, supraclavicular, and axillary nodes normal. Resp: clear to auscultation bilaterally GI: soft, non-tender; bowel sounds normal; no masses,  no organomegaly and Feeding tube with less leakage around it mild erythema at the site Extremities: extremities normal, atraumatic, no cyanosis or edema Neurologic: Grossly normal  Diagnostic Studies & Laboratory data:         Recent Radiology Findings: Dg Chest 2 View  Result Date: 12/31/2018 CLINICAL DATA:  Adenocarcinoma of the gastroesophageal junction. EXAM: CHEST - 2 VIEW COMPARISON:  December 04, 2018 FINDINGS: The heart size and mediastinal contours are within normal limits. Patchy consolidation of left lung base is identified, improved compared to prior exam. The previously noted pneumothorax in the right lung is not seen on current exam. There is no pulmonary edema. The visualized skeletal structures are unremarkable. IMPRESSION: Previously noted right pneumothorax is not seen on current exam. Patchy consolidation of left lung base is identified, improved compared prior exam. Electronically Signed   By: Abelardo Diesel M.D.   On: 12/31/2018 15:08   I   I have independently reviewed the above radiology findings and reviewed findings  with the patient.  Recent Labs: Lab Results  Component Value Date   WBC 6.6 02/09/2019   HGB 10.8 (L) 02/09/2019   HCT 35.2 (L) 02/09/2019   PLT 193 02/09/2019   GLUCOSE 128 (H) 02/09/2019   CHOL 161 05/06/2018   TRIG 134.0 05/06/2018   HDL 54.20 05/06/2018   LDLDIRECT 131.7 12/27/2009   LDLCALC 80 05/06/2018   ALT 23 02/06/2019   AST 21  02/06/2019   NA 140 02/09/2019   K 4.0 02/09/2019   CL 107 02/09/2019   CREATININE 0.68 02/11/2019   BUN 14 02/09/2019   CO2 28 02/09/2019   TSH 1.24 05/06/2018   INR 1.1 11/22/2018   HGBA1C 5.4 05/06/2018   Wt Readings from Last 3 Encounters:  03/05/19 143 lb (64.9 kg)  02/25/19 146 lb 11.2 oz (66.5 kg)  02/20/19 147 lb (66.7 kg)     Assessment / Plan:    Recurrent anastomotic stricture at the cervical esophago-gastrotomy.  Repeat dilatation were discussed with the patient, he would like to be able to swallow and eat and understands the risk of dilatation.  He was concerned about stent placement, thinking this would prevent him from eating.  He will contact Dr. [name] about repeat dilatation.    [name] 03/06/2019 9:05 AM

## 2019-03-06 ENCOUNTER — Telehealth: Payer: Self-pay | Admitting: Internal Medicine

## 2019-03-06 DIAGNOSIS — C160 Malignant neoplasm of cardia: Secondary | ICD-10-CM

## 2019-03-06 NOTE — Telephone Encounter (Signed)
Pt's wife requested a call back regarding husband's dysphagia.

## 2019-03-06 NOTE — Telephone Encounter (Signed)
I reviewed Dr. Everrett Coombe note from yesterday.  He asked patient to discuss repeat dilation for his inability to swallow liquids or solids.  Does he need OV or direct? Please review notes, ? About possible stent

## 2019-03-07 ENCOUNTER — Other Ambulatory Visit (HOSPITAL_COMMUNITY)
Admission: RE | Admit: 2019-03-07 | Discharge: 2019-03-07 | Disposition: A | Discharge status: Home / Self Care | Payer: Medicare Other | Source: Ambulatory Visit | Attending: Internal Medicine | Admitting: Internal Medicine

## 2019-03-07 ENCOUNTER — Other Ambulatory Visit: Payer: Self-pay

## 2019-03-07 DIAGNOSIS — Z01812 Encounter for preprocedural laboratory examination: Secondary | ICD-10-CM | POA: Insufficient documentation

## 2019-03-07 DIAGNOSIS — C159 Malignant neoplasm of esophagus, unspecified: Secondary | ICD-10-CM | POA: Insufficient documentation

## 2019-03-07 DIAGNOSIS — K222 Esophageal obstruction: Secondary | ICD-10-CM

## 2019-03-07 DIAGNOSIS — K228 Other specified diseases of esophagus: Secondary | ICD-10-CM | POA: Insufficient documentation

## 2019-03-07 DIAGNOSIS — Z20828 Contact with and (suspected) exposure to other viral communicable diseases: Secondary | ICD-10-CM | POA: Diagnosis not present

## 2019-03-07 DIAGNOSIS — C160 Malignant neoplasm of cardia: Secondary | ICD-10-CM

## 2019-03-07 NOTE — Telephone Encounter (Signed)
Please put him on for next Tues at [facility] to follow my 1015  EGD + dilation + stent (we have the stent) NEED FLUORO  He will need a Covid test today  I will call him and explain more later today

## 2019-03-07 NOTE — Telephone Encounter (Signed)
I called and explained that my plan is to do a dilation and placed the stent and then hopefully remove the stent several weeks down the road for a more permanent dilation.  I did explain that I am suspicious that he has recurrent cancer and that plan may not work out and he may need to keep the stent.  Discussed risks of perforation, foreign body sensation and pain after stent placement.  Goal is for stent placement and discharge to home after the procedure and not hospitalization but again that is a possibility.  He understands and accepts all of this.

## 2019-03-07 NOTE — Telephone Encounter (Signed)
Patient has been scheduled and understands to go for COVID screen today at 11:30 and be at [facility] on 04/10/19 9:30.  He understands to be NPO after midnight.  He will stop his jejunal feedings at 7:00 am.  He understands that Dr. [name] will call him later to discuss

## 2019-03-08 LAB — NOVEL CORONAVIRUS, NAA (HOSP ORDER, SEND-OUT TO REF LAB; TAT 18-24 HRS): SARS-CoV-2, NAA: NOT DETECTED

## 2019-03-10 ENCOUNTER — Other Ambulatory Visit: Payer: Self-pay

## 2019-03-10 ENCOUNTER — Encounter (HOSPITAL_COMMUNITY): Payer: Self-pay | Admitting: *Deleted

## 2019-03-10 NOTE — Anesthesia Preprocedure Evaluation (Addendum)
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 Has not had anything by mouth since then  J tube   Endo/Other  Pre-diabetic  Renal/GU negative Renal ROS   BPH    Musculoskeletal negative musculoskeletal ROS (+)   Abdominal Normal abdominal exam  (+)   Peds  Hematology  (+) anemia ,   Anesthesia Other Findings   Reproductive/Obstetrics negative OB ROS                                                            Anesthesia Evaluation  Patient identified by MRN, date of birth, ID band Patient awake    Reviewed: Allergy & Precautions, NPO status , Patient's Chart, lab work & pertinent test results  Airway Mallampati: II  TM Distance: >3 FB Neck ROM: Full    Dental  (+) Teeth Intact, Dental Advisory Given   Pulmonary former smoker,    breath sounds clear to auscultation       Cardiovascular hypertension,  Rhythm:Regular Rate:Normal     Neuro/Psych    GI/Hepatic   Endo/Other    Renal/GU      Musculoskeletal   Abdominal Normal abdominal exam  (+)   Peds  Hematology   Anesthesia Other Findings   Reproductive/Obstetrics                             Anesthesia  Physical  Anesthesia Plan  ASA: II  Anesthesia Plan: MAC   Post-op Pain Management:    Induction:   PONV Risk Score and Plan: 2 and Ondansetron, Midazolam and Treatment may vary due to age or medical condition  Airway Management Planned: Nasal Cannula, Mask and Natural Airway  Additional Equipment: None  Intra-op Plan:   Post-operative Plan: Post-operative intubation/ventilation  Informed Consent: I have reviewed the patients History and Physical, chart, labs and discussed the procedure including the risks, benefits and alternatives for the proposed anesthesia with the patient or authorized representative who has indicated his/her understanding and acceptance.       Plan Discussed with: CRNA  Anesthesia Plan Comments:         Anesthesia Quick Evaluation  Anesthesia Physical  Anesthesia Plan  ASA: III  Anesthesia Plan: MAC   Post-op Pain Management:    Induction:   PONV Risk Score and Plan: 2 and Ondansetron, Midazolam and Treatment may vary due to age or medical condition  Airway Management Planned: Nasal Cannula and Natural Airway  Additional Equipment: None  Intra-op Plan:   Post-operative Plan:   Informed Consent: I have reviewed the patients History and  Physical, chart, labs and discussed the procedure including the risks, benefits and alternatives for the proposed anesthesia with the patient or authorized representative who has indicated his/her understanding and acceptance.       Plan Discussed with: CRNA  Anesthesia Plan Comments: (Aspiration precautions: maintain lateral decubitus or HOB elevated, vent J tube)      Anesthesia Quick Evaluation

## 2019-03-11 ENCOUNTER — Ambulatory Visit (HOSPITAL_COMMUNITY): Payer: Medicare Other

## 2019-03-11 ENCOUNTER — Ambulatory Visit (HOSPITAL_COMMUNITY): Payer: Medicare Other | Admitting: Anesthesiology

## 2019-03-11 ENCOUNTER — Other Ambulatory Visit: Payer: Self-pay

## 2019-03-11 ENCOUNTER — Encounter (HOSPITAL_COMMUNITY): Payer: Self-pay

## 2019-03-11 ENCOUNTER — Ambulatory Visit (HOSPITAL_COMMUNITY)
Admission: RE | Admit: 2019-03-11 | Discharge: 2019-03-11 | Disposition: A | Discharge status: Home / Self Care | Payer: Medicare Other | Attending: Internal Medicine | Admitting: Internal Medicine

## 2019-03-11 ENCOUNTER — Encounter (HOSPITAL_COMMUNITY)
Admission: RE | Disposition: A | Discharge status: Home / Self Care | Payer: Self-pay | Source: Home / Self Care | Attending: Internal Medicine

## 2019-03-11 DIAGNOSIS — C160 Malignant neoplasm of cardia: Secondary | ICD-10-CM

## 2019-03-11 DIAGNOSIS — Z79899 Other long term (current) drug therapy: Secondary | ICD-10-CM | POA: Insufficient documentation

## 2019-03-11 DIAGNOSIS — Z9049 Acquired absence of other specified parts of digestive tract: Secondary | ICD-10-CM | POA: Diagnosis not present

## 2019-03-11 DIAGNOSIS — R7303 Prediabetes: Secondary | ICD-10-CM | POA: Diagnosis not present

## 2019-03-11 DIAGNOSIS — K5790 Diverticulosis of intestine, part unspecified, without perforation or abscess without bleeding: Secondary | ICD-10-CM | POA: Diagnosis not present

## 2019-03-11 DIAGNOSIS — Z8501 Personal history of malignant neoplasm of esophagus: Secondary | ICD-10-CM | POA: Diagnosis not present

## 2019-03-11 DIAGNOSIS — Z808 Family history of malignant neoplasm of other organs or systems: Secondary | ICD-10-CM | POA: Insufficient documentation

## 2019-03-11 DIAGNOSIS — I10 Essential (primary) hypertension: Secondary | ICD-10-CM | POA: Diagnosis not present

## 2019-03-11 DIAGNOSIS — J860 Pyothorax with fistula: Secondary | ICD-10-CM | POA: Diagnosis not present

## 2019-03-11 DIAGNOSIS — D649 Anemia, unspecified: Secondary | ICD-10-CM | POA: Insufficient documentation

## 2019-03-11 DIAGNOSIS — R1310 Dysphagia, unspecified: Secondary | ICD-10-CM | POA: Insufficient documentation

## 2019-03-11 DIAGNOSIS — Z8249 Family history of ischemic heart disease and other diseases of the circulatory system: Secondary | ICD-10-CM | POA: Diagnosis not present

## 2019-03-11 DIAGNOSIS — E785 Hyperlipidemia, unspecified: Secondary | ICD-10-CM | POA: Diagnosis not present

## 2019-03-11 DIAGNOSIS — Z8509 Personal history of malignant neoplasm of other digestive organs: Secondary | ICD-10-CM | POA: Insufficient documentation

## 2019-03-11 DIAGNOSIS — K222 Esophageal obstruction: Secondary | ICD-10-CM | POA: Diagnosis not present

## 2019-03-11 DIAGNOSIS — Z87891 Personal history of nicotine dependence: Secondary | ICD-10-CM | POA: Diagnosis not present

## 2019-03-11 DIAGNOSIS — Z809 Family history of malignant neoplasm, unspecified: Secondary | ICD-10-CM | POA: Diagnosis not present

## 2019-03-11 DIAGNOSIS — N400 Enlarged prostate without lower urinary tract symptoms: Secondary | ICD-10-CM | POA: Diagnosis not present

## 2019-03-11 HISTORY — PX: ESOPHAGEAL STENT PLACEMENT: SHX5540

## 2019-03-11 HISTORY — PX: ESOPHAGOGASTRODUODENOSCOPY (EGD) WITH PROPOFOL: SHX5813

## 2019-03-11 HISTORY — PX: BALLOON DILATION: SHX5330

## 2019-03-11 HISTORY — PX: SAVORY DILATION: SHX5439

## 2019-03-11 SURGERY — ESOPHAGOGASTRODUODENOSCOPY (EGD) WITH PROPOFOL
Anesthesia: Monitor Anesthesia Care

## 2019-03-11 MED ORDER — LACTATED RINGERS IV SOLN
INTRAVENOUS | Status: DC
  Administered 2019-03-11: 10:00:00 1000 mL via INTRAVENOUS

## 2019-03-11 MED ORDER — PROPOFOL 500 MG/50ML IV EMUL
INTRAVENOUS | Status: DC | PRN
  Administered 2019-03-11: 125 ug/kg/min via INTRAVENOUS

## 2019-03-11 MED ORDER — PROPOFOL 10 MG/ML IV BOLUS
INTRAVENOUS | Status: AC
  Filled 2019-03-11: qty 20

## 2019-03-11 MED ORDER — ONDANSETRON HCL 4 MG/2ML IJ SOLN
INTRAMUSCULAR | Status: DC | PRN
  Administered 2019-03-11: 4 mg via INTRAVENOUS

## 2019-03-11 MED ORDER — IOHEXOL 300 MG/ML  SOLN
INTRAMUSCULAR | Status: DC | PRN
  Administered 2019-03-11: 75 mL via ORAL

## 2019-03-11 MED ORDER — PROPOFOL 10 MG/ML IV BOLUS
INTRAVENOUS | Status: AC
  Filled 2019-03-11: qty 40

## 2019-03-11 MED ORDER — SODIUM CHLORIDE 0.9 % IV SOLN: INTRAVENOUS | Status: DC

## 2019-03-11 MED ORDER — PROPOFOL 500 MG/50ML IV EMUL
INTRAVENOUS | Status: DC | PRN
  Administered 2019-03-11: 20 mg via INTRAVENOUS

## 2019-03-11 SURGICAL SUPPLY — 14 items

## 2019-03-11 NOTE — H&P (Signed)
[facility] History and Physical   Primary Care Physician:  Binnie Rail, MD   Reason for Procedure:   dysphagia  Plan:    Dilate and stent esophagogastric stricture  The risks and benefits as well as alternatives of endoscopic procedure(s) have been discussed and reviewed. All questions answered. The patient agrees to proceed.  He is aware of increased perforation risk and accepts that the procedure could be lethal - he is unable to eat anything and quality of life is poor.    HPI: [name] [name] is a 68 y.o. male s/p esophagogastrectomy for GER junction cancer with recurrent stricture. Unable to eat now. He wants to repeat dilation and accepts idea of a stent to treat.     Past Medical History:  Diagnosis Date  . Adenocarcinoma of gastroesophageal junction ([place]) 07/29/2018  . Anemia   . Diverticulosis   . Family history of cancer   . Hyperlipidemia   . Hypertension   . Pre-diabetes    but patient's wife states he has never been told that  . Tobacco abuse     Past Surgical History:  Procedure Laterality Date  . BALLOON DILATION N/A 02/06/2019   Procedure: BALLOON DILATION;  Surgeon: Gatha Mayer, MD;  Location: [facility];  Service: Endoscopy;  Laterality: N/A;  . BIOPSY  02/06/2019   Procedure: BIOPSY;  Surgeon: Gatha Mayer, MD;  Location: [facility];  Service: Endoscopy;;  . COLONOSCOPY    . COMPLETE ESOPHAGECTOMY N/A 11/25/2018   Procedure: TRANSHIATAL TOTAL ESOPHAGECTOMY COMPLETE WITH CERVICAL ESOPHAGOGASTROSTOMY;  Surgeon: [name], MD;  Location: [facility] OR;  Service: Thoracic;  Laterality: N/A;  . ESOPHAGOGASTRODUODENOSCOPY (EGD) WITH PROPOFOL N/A 02/06/2019   Procedure: ESOPHAGOGASTRODUODENOSCOPY (EGD) WITH PROPOFOL;  Surgeon: Gatha Mayer, MD;  Location: [facility];  Service: Endoscopy;  Laterality: N/A;  needs fluoro  . IR REPLC DUODEN/JEJUNO TUBE PERCUT W/FLUORO  12/20/2018  . JEJUNOSTOMY N/A 11/25/2018   Procedure: FEEDING  JEJUNOSTOMY;  Surgeon: [name], MD;  Location: [place];  Service: Thoracic;  Laterality: N/A;  . PARASTERNAL EXPLORATION Left 12/02/2018   Procedure: LEFT NECK EXPLORATION;  Surgeon: [name], MD;  Location: [place];  Service: Thoracic;  Laterality: Left;  . PYLOROPLASTY  11/25/2018   Procedure: Pyloroplasty;  Surgeon: [name], MD;  [facility];  Service: Thoracic;;  . VIDEO BRONCHOSCOPY N/A 11/25/2018   Procedure: VIDEO BRONCHOSCOPY;  Surgeon: [name], MD;  Location: [place];  Service: Thoracic;  Laterality: N/A;  . WISDOM TOOTH EXTRACTION      Prior to Admission medications   Medication Sig Start Date End Date Taking? Authorizing Provider  Hydrocortisone ([name]'S BUTT CREAM) CREA Apply 1 application topically 2 (two) times daily as needed for irritation (apply around g tube). 01/14/19  Yes [name], MD  Nutritional Supplements (FEEDING SUPPLEMENT, OSMOLITE 1.5 CAL,) LIQD Place 237 mLs into feeding tube continuous. Uses 7 cans over 24 hours   Yes [provider]  oxyCODONE (ROXICODONE) 5 MG/5ML solution Take 5 mLs (5 mg total) by mouth every 6 (six) hours as needed for up to 7 days for severe pain. 03/04/19 03/11/19 Yes [name], MD    Current Facility-Administered Medications  Medication Dose Route Frequency Provider Last Rate Last Dose  . 0.9 %  sodium chloride infusion   Intravenous Continuous Gatha Mayer, MD      . lactated ringers infusion   Intravenous Continuous Gatha Mayer, MD 10 mL/hr at  03/11/19 1006     Facility-Administered Medications Ordered in Other Encounters  Medication Dose Route Frequency Provider Last Rate Last Dose  . alum & mag hydroxide-simeth (MAALOX/MYLANTA) 200-200-20 MG/5ML suspension 30 mL  30 mL Oral Once Harle Stanford., PA-C       And  . lidocaine (XYLOCAINE) 2 % viscous mouth solution 15 mL  15 mL Oral Once Harle Stanford., PA-C        Allergies as of 03/07/2019  . (No Known  Allergies)    Family History  Problem Relation Age of Onset  . Cancer Mother        bone cancer  . Heart attack Father 47  . Cancer Sister 51       unk type  . Heart attack Paternal Uncle        X2, both < 55  . COPD Neg Hx   . Diabetes Neg Hx   . Stroke Neg Hx   . Colon cancer Neg Hx     Social History   Social History Narrative   Retired   Psychologist, counselling for exercise.   Working for [employer] [phone]). Enjoys volunteering.    Former smoker, rare EtOH     Review of Systems:  All other review of systems negative except as mentioned in the HPI.  Physical Exam: Vital signs in last 24 hours: Temp:  [97.9 F (36.6 C)] 97.9 F (36.6 C) (09/22 0951) Pulse Rate:  [43] 43 (09/22 0951) Resp:  [15] 15 (09/22 0951) BP: (144)/(63) 144/63 (09/22 0951) SpO2:  [100 %] 100 % (09/22 0951)   General:   Alert,  Well-developed, well-nourished, pleasant and cooperative in NAD Lungs:  Clear throughout to auscultation.   Heart:  Regular rate and rhythm; no murmurs, clicks, rubs,  or gallops. Abdomen:  Soft, nontender and nondistended. Normal bowel sounds.  J tube LLQ Neuro/Psych:  Alert and cooperative. Normal mood and affect. A and O x 3   @Julieanne Hadsall  Simonne Maffucci, MD, [facility], [facility], [facility] [phone] (pager) 03/11/2019 10:59 AM@

## 2019-03-11 NOTE — Progress Notes (Signed)
This pt HR is constantly brady. Pt came in with HR:43. Pt HR currently 38. This RN spoke with Dr. Doroteo Glassman about pt HR. And stated she was ok with this. Pt is asymptomatic.

## 2019-03-11 NOTE — Anesthesia Procedure Notes (Signed)
Procedure Name: MAC Date/Time: 03/11/2019 11:06 AM Performed by: Niel Hummer, CRNA Pre-anesthesia Checklist: Patient identified, Emergency Drugs available, Suction available and Patient being monitored Patient Re-evaluated:Patient Re-evaluated prior to induction Oxygen Delivery Method: Nasal cannula

## 2019-03-11 NOTE — Transfer of Care (Signed)
Immediate Anesthesia Transfer of Care Note  Patient: [name] [name]  Procedure(s) Performed: ESOPHAGOGASTRODUODENOSCOPY (EGD) WITH PROPOFOL (N/A ) ESOPHAGEAL STENT PLACEMENT (N/A ) SAVORY DILATION (N/A ) BALLOON DILATION (N/A )  Patient Location: PACU  Anesthesia Type:MAC  Level of Consciousness: awake, alert  and oriented  Airway & Oxygen Therapy: Patient Spontanous Breathing and Patient connected to nasal cannula oxygen  Post-op Assessment: Report given to RN and Post -op Vital signs reviewed and stable  Post vital signs: Reviewed and stable  Last Vitals:  Vitals Value Taken Time  BP    Temp    Pulse    Resp    SpO2      Last Pain:  Vitals:   03/11/19 0951  TempSrc: Oral  PainSc: 0-No pain         Complications: No apparent anesthesia complications

## 2019-03-11 NOTE — Progress Notes (Signed)
Patient going to radiology for barium swallow. Will return to endo after test completed.

## 2019-03-11 NOTE — Progress Notes (Signed)
Pt returned to endo unit

## 2019-03-11 NOTE — Anesthesia Postprocedure Evaluation (Signed)
Anesthesia Post Note  Patient: [name] [name]  Procedure(s) Performed: ESOPHAGOGASTRODUODENOSCOPY (EGD) WITH PROPOFOL (N/A ) ESOPHAGEAL STENT PLACEMENT (N/A ) SAVORY DILATION (N/A ) BALLOON DILATION (N/A )     Patient location during evaluation: PACU Anesthesia Type: MAC Level of consciousness: awake and alert Pain management: pain level controlled Vital Signs Assessment: post-procedure vital signs reviewed and stable Respiratory status: spontaneous breathing, nonlabored ventilation, respiratory function stable and patient connected to nasal cannula oxygen Cardiovascular status: stable and blood pressure returned to baseline Postop Assessment: no apparent nausea or vomiting Anesthetic complications: no    Last Vitals:  Vitals:   03/11/19 0951 03/11/19 1212  BP: (!) 144/63 (!) 147/58  Pulse: (!) 43 (!) 44  Resp: 15 (!) 22  Temp: 36.6 C 36.5 C  SpO2: 100% 98%    Last Pain:  Vitals:   03/11/19 1212  TempSrc: Oral  PainSc: 0-No pain                 Pervis Hocking

## 2019-03-11 NOTE — Progress Notes (Signed)
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

## 2019-03-11 NOTE — Discharge Instructions (Signed)
The procedure was successful - dilation and stent placement.  A new problem was identified - a tracheoesophageal fistula or a connection between the esophagus and breathing tubes - it was present at beginning of procedure.  The stent is covering and treating this so hopefully ok.  YOU HAD AN ENDOSCOPIC PROCEDURE TODAY: Refer to the procedure report and other information in the discharge instructions given to you for any specific questions about what was found during the examination. If this information does not answer your questions, please call Dr. Celesta Aver office at [phone] to clarify.   YOU SHOULD EXPECT: Some feelings of bloating in the abdomen. Passage of more gas than usual. Walking can help get rid of the air that was put into your GI tract during the procedure and reduce the bloating. If you had a lower endoscopy (such as a colonoscopy or flexible sigmoidoscopy) you may notice spotting of blood in your stool or on the toilet paper. Some abdominal soreness may be present for a day or two, also.  DIET:  Clear liquids only today - I will contact you tomorrow to discuss changing diet  See stent diet instructions   ACTIVITY: Your care partner should take you home directly after the procedure. You should plan to take it easy, moving slowly for the rest of the day. You can resume normal activity the day after the procedure however YOU SHOULD NOT DRIVE, use power tools, machinery or perform tasks that involve climbing or major physical exertion for 24 hours (because of the sedation medicines used during the test).   SYMPTOMS TO REPORT IMMEDIATELY: A gastroenterologist can be reached at any hour. Please call [phone]  for any of the following symptoms:  Following lower endoscopy (colonoscopy, flexible sigmoidoscopy) Excessive amounts of blood in the stool  Significant tenderness, worsening of abdominal pains  Swelling of the abdomen that is new, acute  Fever of 100 or higher    Following upper endoscopy (EGD, EUS, ERCP, esophageal dilation) Vomiting of blood or coffee ground material  New, significant abdominal pain  New, significant chest pain or pain under the shoulder blades  Painful or persistently difficult swallowing  New shortness of breath  Black, tarry-looking or red, bloody stools  FOLLOW UP:  If any biopsies were taken you will be contacted by phone or by letter within the next 1-3 weeks. Call [phone]  if you have not heard about the biopsies in 3 weeks.  Please also call with any specific questions about appointments or follow up tests.

## 2019-03-11 NOTE — Op Note (Signed)
[facility] Patient Name: [name] [name] Procedure Date: 03/11/2019 MRN: [id number] Attending MD: Gatha Mayer , MD Date of Birth: August 23, 1950 CSN: [id number] Age: 68 Admit Type: Outpatient Procedure:                Upper GI endoscopy Indications:              Therapeutic procedure, Dysphagia, Stent insertion Providers:                Gatha Mayer, MD, Cleda Daub, RN, Cherylynn Ridges, Technician, Lina Sar, Technician,                            Rosario Adie, CRNA Referring MD:              Medicines:                Propofol per Anesthesia, Monitored Anesthesia Care Complications:            No immediate complications. Estimated Blood Loss:     Estimated blood loss was minimal. Procedure:                Pre-Anesthesia Assessment:                           - Prior to the procedure, a History and Physical                            was performed, and patient medications and                            allergies were reviewed. The patient's tolerance of                            previous anesthesia was also reviewed. The risks                            and benefits of the procedure and the sedation                            options and risks were discussed with the patient.                            All questions were answered, and informed consent                            was obtained. Prior Anticoagulants: The patient has                            taken no previous anticoagulant or antiplatelet                            agents. ASA Grade Assessment: II - A patient with  mild systemic disease. After reviewing the risks                            and benefits, the patient was deemed in                            satisfactory condition to undergo the procedure.                           After obtaining informed consent, the endoscope was                            passed under direct vision.  Throughout the                            procedure, the patient's blood pressure, pulse, and                            oxygen saturations were monitored continuously. The                            [id number]-[id number]) Olympus ultra slim endoscope                            was introduced through the mouth, and advanced to                            the upper third of esophagus. The GIF-H190                            [id number]) Olympus gastroscope was introduced                            through the and advanced to the. The upper GI                            endoscopy was technically difficult and complex due                            to stricture. The patient tolerated the procedure                            well. Scope In: Scope Out: Findings:      One severe stenosis was found in the proximal esophagus. This stenosis       measured 3 mm (inner diameter). The stenosis was not traversed. Contrast       injected showing short tight stricture. Later it was appreciated that       ther is a fisdtula - trache-esophageal vs bronchoesophageal. Small. A       guidewire was placed under fluoroscopic guidance and the scope was       withdrawn. Dilation was performed with a Savary dilator with severe       resistance at 5 mm. The dilation site was examined following endoscope       reinsertion  and showed mild improvement in luminal narrowing. Estimated       blood loss was minimal. A guidewire was placed under fluoroscopic       guidance and the scope was withdrawn. Dilation was attempted, but the       lesion was not amenable to treatment with a Savary dilator due to severe       resistance at 6 mm. A TTS dilator was passed through the scope. Dilation       with a 6-7-8 mm balloon dilator was performed to 8 mm under fluoroscopic       guidance. The dilation site was examined and showed moderate improvement       in luminal narrowing. Estimated blood loss was minimal. This was stented        with an 18 mm x 10.3 cm WallFlex covered stent under fluoroscopic       guidance. Estimated blood loss was minimal. Impression:               - Esophageal stenosis. Tracheo/Bronch-esophageal                            fisyula also found                           Dilated stricture 5 mm Savaryu then 6,7,8 mm                            balloon. 18 mmx103 mm fully covered Wallflex stent                            placed.                           - No specimens collected. Moderate Sedation:      Not Applicable - Patient had care per Anesthesia. Recommendation:           - Patient has a contact number available for                            emergencies. The signs and symptoms of potential                            delayed complications were discussed with the                            patient. Return to normal activities tomorrow.                            Written discharge instructions were provided to the                            patient.                           - Clear liquid diet.                           - Continue present medications.                           -  CHECKING BA SWALLOW TO MAKE SURE TE OR BE FISTULA                            IS SEALED                           WILL ADVANCE DIET SLOWLY TO A STENT DIET OVER NEXT                            FEW DAYS ASSUMING SWALLOW SHOWS SEALED FISTULA Procedure Code(s):        --- Professional ---                           [phone], Esophagoscopy, flexible, transoral; with                            placement of endoscopic stent (includes pre- and                            post-dilation and guide wire passage, when                            performed) Diagnosis Code(s):        --- Professional ---                           K22.2, Esophageal obstruction                           R13.10, Dysphagia, unspecified                           Z46.59, Encounter for fitting and adjustment of                            other gastrointestinal  appliance and device CPT copyright 2019 [employer]. All rights reserved. The codes documented in this report are preliminary and upon coder review may  be revised to meet current compliance requirements. Gatha Mayer, MD 03/11/2019 12:32:32 PM This report has been signed electronically. Number of Addenda: 0

## 2019-03-12 ENCOUNTER — Telehealth: Payer: Self-pay | Admitting: Internal Medicine

## 2019-03-12 NOTE — Telephone Encounter (Signed)
The patient had an esophageal stent placed yesterday.  He is having some sore throat/foreign body sensation but is able to drink for the first time in about 10 days.  He feels okay otherwise.  I have advised him to go to step 2 of the stents diet and I will contact him in about 5 days to see how he is doing and see if he can advance further.

## 2019-03-13 ENCOUNTER — Encounter (HOSPITAL_COMMUNITY): Payer: Self-pay | Admitting: Internal Medicine

## 2019-03-17 ENCOUNTER — Telehealth: Payer: Self-pay | Admitting: Internal Medicine

## 2019-03-17 DIAGNOSIS — C160 Malignant neoplasm of cardia: Secondary | ICD-10-CM

## 2019-03-17 DIAGNOSIS — K222 Esophageal obstruction: Secondary | ICD-10-CM

## 2019-03-17 NOTE — Telephone Encounter (Signed)
Step 2 diet going ok Some foreign body sensation  Will do cxr to reassess and determine diet advancement

## 2019-03-18 ENCOUNTER — Other Ambulatory Visit: Payer: Self-pay

## 2019-03-18 ENCOUNTER — Ambulatory Visit (INDEPENDENT_AMBULATORY_CARE_PROVIDER_SITE_OTHER)
Admission: RE | Admit: 2019-03-18 | Discharge: 2019-03-18 | Disposition: A | Discharge status: Home / Self Care | Payer: Medicare Other | Source: Ambulatory Visit | Attending: Internal Medicine | Admitting: Internal Medicine

## 2019-03-18 DIAGNOSIS — K222 Esophageal obstruction: Secondary | ICD-10-CM | POA: Diagnosis not present

## 2019-03-18 DIAGNOSIS — C160 Malignant neoplasm of cardia: Secondary | ICD-10-CM | POA: Diagnosis not present

## 2019-03-18 NOTE — Progress Notes (Signed)
I spoke to the patient, the stent has migrated.  He is still swallowing well.  I am going to have him repeat a barium swallow for reassessment and then sort out the next steps.  Hopefully for tomorrow.  I told him only liquids in the morning.

## 2019-03-18 NOTE — Progress Notes (Signed)
Please also have him do a Covid-19 test wed in anticipation of a Friday 10/2 EGD (not scheduled yet)

## 2019-03-18 NOTE — Progress Notes (Signed)
barium

## 2019-03-19 ENCOUNTER — Ambulatory Visit (HOSPITAL_COMMUNITY)
Admission: RE | Admit: 2019-03-19 | Discharge: 2019-03-19 | Disposition: A | Discharge status: Home / Self Care | Payer: Medicare Other | Source: Ambulatory Visit | Attending: Internal Medicine | Admitting: Internal Medicine

## 2019-03-19 ENCOUNTER — Inpatient Hospital Stay (HOSPITAL_COMMUNITY): Admission: RE | Admit: 2019-03-19 | Payer: Medicare Other | Source: Ambulatory Visit

## 2019-03-19 ENCOUNTER — Other Ambulatory Visit: Payer: Self-pay

## 2019-03-19 ENCOUNTER — Telehealth: Payer: Self-pay | Admitting: Internal Medicine

## 2019-03-19 ENCOUNTER — Other Ambulatory Visit (HOSPITAL_COMMUNITY)
Admission: RE | Admit: 2019-03-19 | Discharge: 2019-03-19 | Disposition: A | Discharge status: Home / Self Care | Payer: Medicare Other | Source: Ambulatory Visit | Attending: Internal Medicine | Admitting: Internal Medicine

## 2019-03-19 DIAGNOSIS — K222 Esophageal obstruction: Secondary | ICD-10-CM

## 2019-03-19 DIAGNOSIS — C160 Malignant neoplasm of cardia: Secondary | ICD-10-CM | POA: Diagnosis not present

## 2019-03-19 DIAGNOSIS — T85528A Displacement of other gastrointestinal prosthetic devices, implants and grafts, initial encounter: Secondary | ICD-10-CM

## 2019-03-19 DIAGNOSIS — Z20828 Contact with and (suspected) exposure to other viral communicable diseases: Secondary | ICD-10-CM | POA: Insufficient documentation

## 2019-03-19 LAB — SARS CORONAVIRUS 2 (TAT 6-24 HRS): SARS Coronavirus 2: NEGATIVE

## 2019-03-19 NOTE — Telephone Encounter (Signed)
Spoke with the wife. Due to the "closeness" of the test to the procedure date, [name] [name] will call her tomorrow by noon to let her know if the test has resulted. If it has not, to avoid the EGD for stent removal/placement being delayed, they will be instructed on where to go for a rapid COVID19 test.

## 2019-03-21 ENCOUNTER — Ambulatory Visit (HOSPITAL_COMMUNITY): Payer: Medicare Other | Admitting: Anesthesiology

## 2019-03-21 ENCOUNTER — Other Ambulatory Visit: Payer: Self-pay | Admitting: Internal Medicine

## 2019-03-21 ENCOUNTER — Telehealth: Payer: Self-pay | Admitting: Internal Medicine

## 2019-03-21 ENCOUNTER — Ambulatory Visit (HOSPITAL_COMMUNITY)
Admission: RE | Admit: 2019-03-21 | Discharge: 2019-03-21 | Disposition: A | Discharge status: Home / Self Care | Payer: Medicare Other | Attending: Internal Medicine | Admitting: Internal Medicine

## 2019-03-21 ENCOUNTER — Ambulatory Visit (HOSPITAL_COMMUNITY): Payer: Medicare Other

## 2019-03-21 ENCOUNTER — Encounter (HOSPITAL_COMMUNITY)
Admission: RE | Disposition: A | Discharge status: Home / Self Care | Payer: Self-pay | Source: Home / Self Care | Attending: Internal Medicine

## 2019-03-21 ENCOUNTER — Other Ambulatory Visit: Payer: Self-pay

## 2019-03-21 DIAGNOSIS — T182XXA Foreign body in stomach, initial encounter: Secondary | ICD-10-CM | POA: Insufficient documentation

## 2019-03-21 DIAGNOSIS — Z4659 Encounter for fitting and adjustment of other gastrointestinal appliance and device: Secondary | ICD-10-CM | POA: Diagnosis not present

## 2019-03-21 DIAGNOSIS — T85528A Displacement of other gastrointestinal prosthetic devices, implants and grafts, initial encounter: Secondary | ICD-10-CM

## 2019-03-21 DIAGNOSIS — R7303 Prediabetes: Secondary | ICD-10-CM | POA: Insufficient documentation

## 2019-03-21 DIAGNOSIS — X58XXXA Exposure to other specified factors, initial encounter: Secondary | ICD-10-CM | POA: Diagnosis not present

## 2019-03-21 DIAGNOSIS — I10 Essential (primary) hypertension: Secondary | ICD-10-CM | POA: Insufficient documentation

## 2019-03-21 DIAGNOSIS — Z9049 Acquired absence of other specified parts of digestive tract: Secondary | ICD-10-CM | POA: Diagnosis not present

## 2019-03-21 DIAGNOSIS — Z85028 Personal history of other malignant neoplasm of stomach: Secondary | ICD-10-CM | POA: Diagnosis not present

## 2019-03-21 DIAGNOSIS — C160 Malignant neoplasm of cardia: Secondary | ICD-10-CM

## 2019-03-21 DIAGNOSIS — C159 Malignant neoplasm of esophagus, unspecified: Secondary | ICD-10-CM

## 2019-03-21 DIAGNOSIS — Z931 Gastrostomy status: Secondary | ICD-10-CM | POA: Insufficient documentation

## 2019-03-21 DIAGNOSIS — Z87891 Personal history of nicotine dependence: Secondary | ICD-10-CM | POA: Diagnosis not present

## 2019-03-21 DIAGNOSIS — K222 Esophageal obstruction: Secondary | ICD-10-CM | POA: Diagnosis not present

## 2019-03-21 HISTORY — PX: ESOPHAGOGASTRODUODENOSCOPY (EGD) WITH PROPOFOL: SHX5813

## 2019-03-21 HISTORY — PX: FOREIGN BODY REMOVAL: SHX962

## 2019-03-21 HISTORY — PX: SAVORY DILATION: SHX5439

## 2019-03-21 SURGERY — ESOPHAGOGASTRODUODENOSCOPY (EGD) WITH PROPOFOL
Anesthesia: Monitor Anesthesia Care

## 2019-03-21 MED ORDER — PROPOFOL 10 MG/ML IV BOLUS
INTRAVENOUS | Status: AC
  Filled 2019-03-21: qty 60

## 2019-03-21 MED ORDER — OXYCODONE HCL 5 MG/5ML PO SOLN: 5.0000 mg | ORAL | 0 refills | Status: DC | PRN

## 2019-03-21 MED ORDER — SODIUM CHLORIDE 0.9 % IV SOLN: INTRAVENOUS | Status: DC

## 2019-03-21 MED ORDER — PROPOFOL 500 MG/50ML IV EMUL
INTRAVENOUS | Status: DC | PRN
  Administered 2019-03-21: 125 ug/kg/min via INTRAVENOUS

## 2019-03-21 MED ORDER — LACTATED RINGERS IV SOLN
INTRAVENOUS | Status: DC | PRN
  Administered 2019-03-21: 07:00:00 via INTRAVENOUS

## 2019-03-21 MED ORDER — PROPOFOL 10 MG/ML IV BOLUS
INTRAVENOUS | Status: DC | PRN
  Administered 2019-03-21 (×3): 20 mg via INTRAVENOUS

## 2019-03-21 SURGICAL SUPPLY — 15 items

## 2019-03-21 NOTE — Telephone Encounter (Signed)
Wife states discharge papers state for him to take oxycodone for pain. She states he needs liquid pain medication. Please advise.

## 2019-03-21 NOTE — Anesthesia Postprocedure Evaluation (Signed)
Anesthesia Post Note  Patient: [name] [name]  Procedure(s) Performed: ESOPHAGOGASTRODUODENOSCOPY (EGD) WITH PROPOFOL (N/A ) SAVORY DILATION (N/A )     Patient location during evaluation: Endoscopy Anesthesia Type: MAC Level of consciousness: awake and alert Pain management: pain level controlled Vital Signs Assessment: post-procedure vital signs reviewed and stable Respiratory status: spontaneous breathing, nonlabored ventilation and respiratory function stable Cardiovascular status: stable and blood pressure returned to baseline Postop Assessment: no apparent nausea or vomiting Anesthetic complications: no    Last Vitals:  Vitals:   03/21/19 0820 03/21/19 0830  BP: (!) 156/62 (!) 164/69  Pulse: (!) 57 (!) 54  Resp: 20 13  Temp:    SpO2: 100% 100%    Last Pain:  Vitals:   03/21/19 0830  TempSrc:   PainSc: 0-No pain                 Lynda Rainwater

## 2019-03-21 NOTE — Telephone Encounter (Signed)
Pt had a procedure today at hospital -- wife stated that pt needs liquid painkillers as he has difficulty swallowing.

## 2019-03-21 NOTE — Op Note (Signed)
[facility] Patient Name: [name] [name] Procedure Date: 03/21/2019 MRN: [id number] Attending MD: Gatha Mayer , MD Date of Birth: 1951-05-21 CSN: [id number] Age: 68 Admit Type: Outpatient Procedure:                Upper GI endoscopy Indications:              Stent removal, Repositioning stent Providers:                Gatha Mayer, MD, Cleda Daub, RN, William Dalton, Technician Referring MD:              Medicines:                Propofol per Anesthesia, Monitored Anesthesia Care Complications:            No immediate complications. Estimated Blood Loss:     Estimated blood loss was minimal. Procedure:                Pre-Anesthesia Assessment:                           - Prior to the procedure, a History and Physical                            was performed, and patient medications and                            allergies were reviewed. The patient's tolerance of                            previous anesthesia was also reviewed. The risks                            and benefits of the procedure and the sedation                            options and risks were discussed with the patient.                            All questions were answered, and informed consent                            was obtained. Prior Anticoagulants: The patient has                            taken no previous anticoagulant or antiplatelet                            agents. ASA Grade Assessment: II - A patient with                            mild systemic disease. After reviewing the risks  and benefits, the patient was deemed in                            satisfactory condition to undergo the procedure.                           After obtaining informed consent, the endoscope was                            passed under direct vision. Throughout the                            procedure, the patient's blood pressure, pulse, and                      oxygen saturations were monitored continuously. The                            GIF-H190 [id number]) Olympus gastroscope was                            introduced through the mouth, and advanced to the                            antrum of the stomach. The upper GI endoscopy was                            accomplished without difficulty. The patient                            tolerated the procedure well. Scope In: Scope Out: Findings:      One severe stenosis was found in the proximal esophagus. This stenosis       measured 8 mm (inner diameter) x 1 cm (in length). The stenosis was       traversed after dilation. A guidewire was placed under fluoroscopic       guidance and the scope was withdrawn. Dilation was performed with a       Savary dilator with mild resistance at 9 mm. A guidewire was placed       under fluoroscopic guidance and the scope was withdrawn. Dilation was       performed with a Savary dilator with mild resistance at 10 mm. A       guidewire was placed under fluoroscopic guidance and the scope was       withdrawn. Dilation was performed with a Savary dilator with moderate       resistance at 11 mm. A guidewire was placed under fluoroscopic guidance       and the scope was withdrawn. Dilation was performed with a Savary       dilator with no resistance at 12 mm. The dilation site was examined and       showed moderate improvement in luminal narrowing. Estimated blood loss       was minimal.      A metal stent was found in the stomach. Stent removal was accomplished       with a Raptor grasping device. Verification of patient identification  for the specimen was done. Estimated blood loss was minimal. It was       pulled back into position in the esophagus/stomach at stricture site.       Proximal location precluded clipping in place. Impression:               - Esophageal stenosis. Dilated.                           - Pre-existing esophageal stent,  removed from                            stomach and replaced into position at EG                            anastomotic stricture Moderate Sedation:      Not Applicable - Patient had care per Anesthesia. Recommendation:           - Patient has a contact number available for                            emergencies. The signs and symptoms of potential                            delayed complications were discussed with the                            patient. Return to normal activities tomorrow.                            Written discharge instructions were provided to the                            patient.                           - Clear liqs x 1 hr, step 2 stent today try step 3                            stent tomorrow.                           - Continue present medications.                           - CXR at my office 10/5 - ordered                           I anticipate this stent will migrate again but it                            is helping by dilating - we can upsize next time                            hopefully - should that occur  it is too proximal to clip in place Procedure Code(s):        --- Professional ---                           [phone], Esophagoscopy, flexible, transoral; with                            removal of foreign body(s)                           43220, Esophagoscopy, flexible, transoral; with                            transendoscopic balloon dilation (less than 30 mm                            diameter) Diagnosis Code(s):        --- Professional ---                           K22.2, Esophageal obstruction                           Z97.8, Presence of other specified devices                           Z46.59, Encounter for fitting and adjustment of                            other gastrointestinal appliance and device CPT copyright 2019 American Medical Association. All rights reserved. The codes documented in this report are  preliminary and upon coder review may  be revised to meet current compliance requirements. Gatha Mayer, MD 03/21/2019 8:31:30 AM This report has been signed electronically. Number of Addenda: 0

## 2019-03-21 NOTE — Telephone Encounter (Signed)
I will send it.

## 2019-03-21 NOTE — Transfer of Care (Signed)
Immediate Anesthesia Transfer of Care Note  Patient: [name] [name]  Procedure(s) Performed: ESOPHAGOGASTRODUODENOSCOPY (EGD) WITH PROPOFOL (N/A ) ESOPHAGEAL STENT PLACEMENT (N/A )  Patient Location: PACU  Anesthesia Type:MAC  Level of Consciousness: sedated  Airway & Oxygen Therapy: Patient Spontanous Breathing and Patient connected to nasal cannula oxygen  Post-op Assessment: Report given to RN and Post -op Vital signs reviewed and stable  Post vital signs: Reviewed and stable  Last Vitals:  Vitals Value Taken Time  BP    Temp    Pulse 69 03/21/19 0814  Resp 18 03/21/19 0814  SpO2 100 % 03/21/19 0814  Vitals shown include unvalidated device data.  Last Pain:  Vitals:   03/21/19 0716  TempSrc: Oral  PainSc: 0-No pain         Complications: No apparent anesthesia complications

## 2019-03-21 NOTE — Anesthesia Preprocedure Evaluation (Signed)
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 Has not had anything by mouth since then  J tube   Endo/Other  Pre-diabetic  Renal/GU negative Renal ROS   BPH    Musculoskeletal negative musculoskeletal ROS (+)   Abdominal Normal abdominal exam  (+)   Peds  Hematology  (+) anemia ,   Anesthesia Other Findings   Reproductive/Obstetrics negative OB ROS                                                              Anesthesia Evaluation  Patient identified by MRN, date of birth, ID band Patient awake    Reviewed: Allergy & Precautions, NPO status , Patient's Chart, lab work & pertinent test results  Airway Mallampati: II  TM Distance: >3 FB Neck ROM: Full    Dental  (+) Teeth Intact, Dental Advisory Given   Pulmonary former smoker,    breath sounds clear to auscultation       Cardiovascular hypertension,  Rhythm:Regular Rate:Normal     Neuro/Psych    GI/Hepatic   Endo/Other    Renal/GU      Musculoskeletal   Abdominal Normal abdominal exam  (+)   Peds  Hematology   Anesthesia Other Findings   Reproductive/Obstetrics                             Anesthesia  Physical  Anesthesia Plan  ASA: II  Anesthesia Plan: MAC   Post-op Pain Management:    Induction:   PONV Risk Score and Plan: 2 and Ondansetron, Midazolam and Treatment may vary due to age or medical condition  Airway Management Planned: Nasal Cannula, Mask and Natural Airway  Additional Equipment: None  Intra-op Plan:   Post-operative Plan: Post-operative intubation/ventilation  Informed Consent: I have reviewed the patients History and Physical, chart, labs and discussed the procedure including the risks, benefits and alternatives for the proposed anesthesia with the patient or authorized representative who has indicated his/her understanding and acceptance.       Plan Discussed with: CRNA  Anesthesia Plan Comments:         Anesthesia Quick Evaluation  Anesthesia Physical  Anesthesia Plan  ASA: III  Anesthesia Plan: MAC   Post-op Pain Management:    Induction:   PONV Risk Score and Plan: 2 and Ondansetron, Midazolam and Treatment may vary due to age or medical condition  Airway Management Planned: Nasal Cannula and Natural Airway  Additional Equipment: None  Intra-op Plan:   Post-operative Plan:   Informed Consent: I have reviewed the patients  History and Physical, chart, labs and discussed the procedure including the risks, benefits and alternatives for the proposed anesthesia with the patient or authorized representative who has indicated his/her understanding and acceptance.       Plan Discussed with: CRNA  Anesthesia Plan Comments:         Anesthesia Quick Evaluation

## 2019-03-21 NOTE — Telephone Encounter (Signed)
Spoke with pts wife and she is aware. 

## 2019-03-21 NOTE — Addendum Note (Signed)
Addended by: [name] E on: 03/21/2019 12:46 PM   Modules accepted: Orders

## 2019-03-21 NOTE — Discharge Instructions (Signed)
° °  I dilated the esophagus to 12 mm and pulled the stent back into position. I was not able to clip it. It is helping you - even if it moves again we can pull it out all the way if needed.  I want to use it as much as we can to stretch things. The next step would likely be a 23 mm diameter stent - this is 18 mm. I think you are not ready for the 23 mm - it would cause too much pain.   I want you to get a chest xray again on Monday at my office.  YOU HAD AN ENDOSCOPIC PROCEDURE TODAY: Refer to the procedure report and other information in the discharge instructions given to you for any specific questions about what was found during the examination. If this information does not answer your questions, please call Dr. Celesta Aver office at [phone] to clarify.   YOU SHOULD EXPECT: Some feelings of bloating in the abdomen. Passage of more gas than usual. Walking can help get rid of the air that was put into your GI tract during the procedure and reduce the bloating. If you had a lower endoscopy (such as a colonoscopy or flexible sigmoidoscopy) you may notice spotting of blood in your stool or on the toilet paper. Some abdominal soreness may be present for a day or two, also.  DIET:   clear liquids x 1 hour then stage 2 stent diet today and try some of stage 3 stent diet tomorrow  ACTIVITY: Your care partner should take you home directly after the procedure. You should plan to take it easy, moving slowly for the rest of the day. You can resume normal activity the day after the procedure however YOU SHOULD NOT DRIVE, use power tools, machinery or perform tasks that involve climbing or major physical exertion for 24 hours (because of the sedation medicines used during the test).   SYMPTOMS TO REPORT IMMEDIATELY: A gastroenterologist can be reached at any hour. Please call [phone]  for any of the following symptoms:   Following upper endoscopy (EGD, EUS, ERCP, esophageal dilation) Vomiting  of blood or coffee ground material  New, significant abdominal pain  New, significant chest pain or pain under the shoulder blades  Painful or persistently difficult swallowing  New shortness of breath  Black, tarry-looking or red, bloody stools  FOLLOW UP:  If any biopsies were taken you will be contacted by phone or by letter within the next 1-3 weeks. Call [phone]  if you have not heard about the biopsies in 3 weeks.  Please also call with any specific questions about appointments or follow up tests.

## 2019-03-21 NOTE — H&P (Signed)
[facility] History and Physical   Primary Care Physician:  Binnie Rail, MD   Reason for Procedure:   remove and replace esophageal stent  Plan:    Upper endoscopy with stent removal, esophageal dilation, stent replaccement     HPI: [name] [name] is a 68 y.o. male with post-op EG anastomosis stricture - s/p recent 18 mm fully covered stent after prior dilation and perforation that healed. He is swallowing better. About 7 days after the stent was placed it was shown to have migrated. Ba swallow showed closure of small fistula. Here for above   Past Medical History:  Diagnosis Date  . Adenocarcinoma of gastroesophageal junction ([place]) 07/29/2018  . Anemia   . Diverticulosis   . Family history of cancer   . Hyperlipidemia   . Hypertension   . Pre-diabetes    but patient's wife states he has never been told that  . Tobacco abuse     Past Surgical History:  Procedure Laterality Date  . BALLOON DILATION N/A 02/06/2019   Procedure: BALLOON DILATION;  Surgeon: Gatha Mayer, MD;  [facility];  Service: Endoscopy;  Laterality: N/A;  . BALLOON DILATION N/A 03/11/2019   Procedure: BALLOON DILATION;  Surgeon: Gatha Mayer, MD;  [facility];  Service: Endoscopy;  Laterality: N/A;  . BIOPSY  02/06/2019   Procedure: BIOPSY;  Surgeon: Gatha Mayer, MD;  [facility];  Service: Endoscopy;;  . COLONOSCOPY    . COMPLETE ESOPHAGECTOMY N/A 11/25/2018   Procedure: TRANSHIATAL TOTAL ESOPHAGECTOMY COMPLETE WITH CERVICAL ESOPHAGOGASTROSTOMY;  Surgeon: [name], MD;  Location: [place];  Service: Thoracic;  Laterality: N/A;  . ESOPHAGEAL STENT PLACEMENT N/A 03/11/2019   Procedure: ESOPHAGEAL STENT PLACEMENT;  Surgeon: Gatha Mayer, MD;  [facility];  Service: Endoscopy;  Laterality: N/A;  . ESOPHAGOGASTRODUODENOSCOPY (EGD) WITH PROPOFOL N/A 02/06/2019   Procedure: ESOPHAGOGASTRODUODENOSCOPY (EGD) WITH PROPOFOL;  Surgeon:  Gatha Mayer, MD;  [facility];  Service: Endoscopy;  Laterality: N/A;  needs fluoro  . ESOPHAGOGASTRODUODENOSCOPY (EGD) WITH PROPOFOL N/A 03/11/2019   Procedure: ESOPHAGOGASTRODUODENOSCOPY (EGD) WITH PROPOFOL;  Surgeon: Gatha Mayer, MD;  [facility];  Service: Endoscopy;  Laterality: N/A;  need fluoro  . IR REPLC DUODEN/JEJUNO TUBE PERCUT W/FLUORO  12/20/2018  . JEJUNOSTOMY N/A 11/25/2018   Procedure: FEEDING JEJUNOSTOMY;  Surgeon: [name], MD;  Location: [place];  Service: Thoracic;  Laterality: N/A;  . PARASTERNAL EXPLORATION Left 12/02/2018   Procedure: LEFT NECK EXPLORATION;  Surgeon: [name], MD;  Location: [place];  Service: Thoracic;  Laterality: Left;  . PYLOROPLASTY  11/25/2018   Procedure: Pyloroplasty;  Surgeon: [name], MD;  Location: [place];  Service: Thoracic;;  . Azzie Almas DILATION N/A 03/11/2019   Procedure: Azzie Almas DILATION;  Surgeon: Gatha Mayer, MD;  Location: [facility];  Service: Endoscopy;  Laterality: N/A;  . VIDEO BRONCHOSCOPY N/A 11/25/2018   Procedure: VIDEO BRONCHOSCOPY;  Surgeon: [name], MD;  Location: [facility];  Service: Thoracic;  Laterality: N/A;  . WISDOM TOOTH EXTRACTION      Prior to Admission medications   Medication Sig Start Date End Date Taking? Authorizing Provider  Hydrocortisone ([name]'S BUTT CREAM) CREA Apply 1 application topically 2 (two) times daily as needed for irritation (apply around g tube). 01/14/19  Yes [name], MD  Nutritional Supplements (FEEDING SUPPLEMENT, OSMOLITE 1.5 CAL,) LIQD Place 237 mLs into feeding tube continuous. Uses 7 cans over 24 hours  Yes [provider]  oxyCODONE-acetaminophen (PERCOCET/ROXICET) 5-325 MG tablet Take 1 tablet by mouth every 4 (four) hours as needed for severe pain (as needed).   Yes [provider]    No current facility-administered medications for this encounter.    Facility-Administered Medications Ordered in  Other Encounters  Medication Dose Route Frequency Provider Last Rate Last Dose  . alum & mag hydroxide-simeth (MAALOX/MYLANTA) 200-200-20 MG/5ML suspension 30 mL  30 mL Oral Once Harle Stanford., PA-C       And  . lidocaine (XYLOCAINE) 2 % viscous mouth solution 15 mL  15 mL Oral Once Harle Stanford., PA-C        Allergies as of 03/19/2019  . (No Known Allergies)    Family History  Problem Relation Age of Onset  . Cancer Mother        bone cancer  . Heart attack Father 22  . Cancer Sister 87       unk type  . Heart attack Paternal Uncle        X2, both < 55  . COPD Neg Hx   . Diabetes Neg Hx   . Stroke Neg Hx   . Colon cancer Neg Hx     Social History   Socioeconomic History  . Marital status: Married    Spouse name: Adele  . Number of children: 0  . Years of education: Not on file  . Highest education level: Not on file  Occupational History  . Occupation: retired  Scientific laboratory technician  . Financial resource strain: Not on file  . Food insecurity    Worry: Not on file    Inability: Not on file  . Transportation needs    Medical: Not on file    Non-medical: Not on file  Tobacco Use  . Smoking status: Former Smoker    Packs/day: 0.25    Years: 50.00    Pack years: 12.50    Types: Cigarettes    Quit date: 06/19/2018    Years since quitting: 0.7  . Smokeless tobacco: Never Used  . Tobacco comment: 06/02/14 2-3 pp week  Substance and Sexual Activity  . Alcohol use: Yes    Alcohol/week: 2.0 standard drinks    Types: 2 Glasses of wine per week    Comment:  occasionally- None for last 6 months  . Drug use: No  . Sexual activity: Not Currently  Lifestyle  . Physical activity    Days per week: Not on file    Minutes per session: Not on file  . Stress: Not on file  Relationships  . Social Herbalist on phone: Not on file    Gets together: Not on file    Attends religious service: Not on file    Active member of club or organization: Not on file    Attends  meetings of clubs or organizations: Not on file    Relationship status: Not on file  . Intimate partner violence    Fear of current or ex partner: Not on file    Emotionally abused: Not on file    Physically abused: Not on file    Forced sexual activity: Not on file  Other Topics Concern  . Not on file  Social History Narrative   Retired   Psychologist, counselling for exercise.   Working for [employer] [phone]). Enjoys volunteering.    Former smoker, rare EtOH    Review of Systems:  All other review of  systems negative except as mentioned in the HPI.  Physical Exam: Vital signs in last 24 hours: Temp:  [97.8 F (36.6 C)] 97.8 F (36.6 C) (10/02 0716) Pulse Rate:  [47] 47 (10/02 0716) Resp:  [16] 16 (10/02 0716) BP: (161)/(65) 161/65 (10/02 0716) SpO2:  [100 %] 100 % (10/02 0716)   General:   Alert,  Well-developed, well-nourished, pleasant and cooperative in NAD Lungs:  Clear throughout to auscultation.   Heart:  Regular rate and rhythm; no murmurs, clicks, rubs,  or gallops. Abdomen:  Soft, nontender and nondistended. Normal bowel sounds.   Neuro/Psych:  Alert and cooperative. Normal mood and affect. A and O x 3   @Irineo Gaulin  Simonne Maffucci, MD, [facility] [phone] (pager) 03/21/2019 7:27 AM@

## 2019-03-24 ENCOUNTER — Ambulatory Visit (INDEPENDENT_AMBULATORY_CARE_PROVIDER_SITE_OTHER)
Admission: RE | Admit: 2019-03-24 | Discharge: 2019-03-24 | Disposition: A | Discharge status: Home / Self Care | Payer: Medicare Other | Source: Ambulatory Visit | Attending: Internal Medicine | Admitting: Internal Medicine

## 2019-03-24 ENCOUNTER — Encounter (HOSPITAL_COMMUNITY): Payer: Self-pay | Admitting: Internal Medicine

## 2019-03-24 ENCOUNTER — Other Ambulatory Visit: Payer: Self-pay

## 2019-03-24 DIAGNOSIS — C160 Malignant neoplasm of cardia: Secondary | ICD-10-CM

## 2019-03-25 NOTE — Progress Notes (Signed)
Radiology report inaccurate - stent was replaced on 10/2 - it is slipping distally again I spoke to radiologist  I spoke to patent and plan is to remove this stent and replace with 23 mm diameter fully covered 103 mm esophageal stent  [facility] Sidle - in the AM - looks like I will need to follow Dr. Rosalie Gums - I have an 0800 procedure and then [facility] is after that  Need fluoro and that stent available

## 2019-03-26 ENCOUNTER — Other Ambulatory Visit: Payer: Self-pay

## 2019-03-26 DIAGNOSIS — C160 Malignant neoplasm of cardia: Secondary | ICD-10-CM

## 2019-03-27 ENCOUNTER — Other Ambulatory Visit (HOSPITAL_COMMUNITY)
Admission: RE | Admit: 2019-03-27 | Discharge: 2019-03-27 | Disposition: A | Discharge status: Home / Self Care | Payer: Medicare Other | Source: Ambulatory Visit | Attending: Internal Medicine | Admitting: Internal Medicine

## 2019-03-27 ENCOUNTER — Encounter (HOSPITAL_COMMUNITY): Payer: Self-pay | Admitting: *Deleted

## 2019-03-27 ENCOUNTER — Other Ambulatory Visit: Payer: Self-pay

## 2019-03-27 ENCOUNTER — Ambulatory Visit: Payer: Medicare Other | Admitting: Cardiothoracic Surgery

## 2019-03-27 VITALS — BP 146/79 | HR 62 | Temp 97.7°F | Ht 71.0 in | Wt 151.0 lb

## 2019-03-27 DIAGNOSIS — Z20828 Contact with and (suspected) exposure to other viral communicable diseases: Secondary | ICD-10-CM | POA: Insufficient documentation

## 2019-03-27 DIAGNOSIS — C160 Malignant neoplasm of cardia: Secondary | ICD-10-CM

## 2019-03-27 DIAGNOSIS — Z01812 Encounter for preprocedural laboratory examination: Secondary | ICD-10-CM | POA: Insufficient documentation

## 2019-03-27 MED ORDER — GERHARDT'S BUTT CREAM: 1.0000 "application " | TOPICAL_CREAM | Freq: Two times a day (BID) | CUTANEOUS | 1 refills | Status: AC | PRN

## 2019-03-27 MED ORDER — OXYCODONE HCL 5 MG/5ML PO SOLN: 5.0000 mg | ORAL | 0 refills | Status: AC | PRN

## 2019-03-27 MED FILL — GERHARDT'S BUTT CREAM: 100000 | 30 days supply | Qty: 60 | Fill #0

## 2019-03-27 NOTE — Progress Notes (Signed)
[address]       [place] [address]             [id number]                  Nicolus Charles Mcnabb [facility] Medical Record [id number] Date of Birth: 1950/12/13  Referring BP:[id number], [name], MD Primary Cardiology: Primary Care:Burns, [name], MD  Chief Complaint:  Follow Up Visit  Cancer Staging Adenocarcinoma of gastroesophageal junction [facility]) Staging form: Esophagus - Adenocarcinoma, AJCC 8th Edition - Pathologic stage from 11/28/2018: Stage IVA (ypT4a, pN2, cM0, G2) - Signed by [name], MD on 12/01/2018  OPERATIVE REPORT DATE OF PROCEDURE:  11/25/2018 PREOPERATIVE DIAGNOSIS:  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 SURGICAL PROCEDURE:  Video bronchoscopy, transhiatal total esophagectomy with cervical esophagogastrostomy, pyloroplasty, feeding jejunostomy, bilateral placement of chest tubes. SURGEON:  [name], MD  History of Present Illness:     Patient returns office today for follow-up visit.  With the help of Dr. [name] the patient is back eating some soft food, his esophagus that has been dilated and a stent placed, unfortunately his slipped and is going to be replaced in the next day or so.  Patient continues on tube feedings but is able to take some soft food like eggs and oatmeal.   Zubrod Score: At the time of surgery this patient's most appropriate activity status/level should be described as: []     0    Normal activity, no symptoms []     1    Restricted in physical strenuous activity but ambulatory, able to do out light work [x]     2    Ambulatory and capable of self care, unable to do work activities, up and about                 >50 % of waking hours                                                                                   []     3    Only limited self care, in bed greater than 50% of  waking hours []     4    Completely disabled, no self care, confined to bed or chair []     5    Moribund  Social History   Tobacco Use  Smoking Status Former Smoker  . Packs/day: 0.25  . Years: 50.00  . Pack years: 12.50  . Types: Cigarettes  . Quit date: 06/19/2018  . Years since quitting: 0.7  Smokeless Tobacco Never Used  Tobacco Comment   06/02/14 2-3 pp week       No Known Allergies  Current Outpatient Medications  Medication Sig Dispense Refill  . Hydrocortisone (Isabellah Sobocinski'S BUTT CREAM) CREA Apply 1 application topically 2 (two) times daily as needed for irritation (apply around g tube). (Patient taking differently: Apply 1 application topically daily. ) 1 each 1  . Nutritional Supplements (FEEDING SUPPLEMENT, OSMOLITE 1.5 CAL,) LIQD Place 237 mLs into feeding tube continuous. Uses 7 cans over 24 hours    .  oxyCODONE (ROXICODONE) 5 MG/5ML solution Take 5 mLs (5 mg total) by mouth every 4 (four) hours as needed for up to 7 days for moderate pain or severe pain. 60 mL 0   No current facility-administered medications for this visit.    Facility-Administered Medications Ordered in Other Visits  Medication Dose Route Frequency Provider Last Rate Last Dose  . alum & mag hydroxide-simeth (MAALOX/MYLANTA) 200-200-20 MG/5ML suspension 30 mL  30 mL Oral Once Harle Stanford., PA-C       And  . lidocaine (XYLOCAINE) 2 % viscous mouth solution 15 mL  15 mL Oral Once Harle Stanford., PA-C           Physical Exam: BP (!) 146/79 (BP Location: Right Arm, Patient Position: Sitting)   Pulse 62   Temp 97.7 F (36.5 C) (Skin)   Ht 5\' 11"  (1.803 m)   Wt 151 lb (68.5 kg)   SpO2 97% Comment: RA  BMI 21.06 kg/m   General appearance: alert and cooperative Neck: no adenopathy, no carotid bruit, no JVD, supple, symmetrical, trachea midline and thyroid not enlarged, symmetric, no tenderness/mass/nodules Lymph nodes: Cervical, supraclavicular, and axillary nodes normal. Resp: clear to  auscultation bilaterally Cardio: regular rate and rhythm, S1, S2 normal, no murmur, click, rub or gallop GI: soft, non-tender; bowel sounds normal; no masses,  no organomegaly Extremities: extremities normal, atraumatic, no cyanosis or edema Neurologic: Grossly normal  Diagnostic Studies & Laboratory data:         Recent Radiology Findings: Dg Chest 2 View  Result Date: 12/31/2018 CLINICAL DATA:  Adenocarcinoma of the gastroesophageal junction. EXAM: CHEST - 2 VIEW COMPARISON:  December 04, 2018 FINDINGS: The heart size and mediastinal contours are within normal limits. Patchy consolidation of left lung base is identified, improved compared to prior exam. The previously noted pneumothorax in the right lung is not seen on current exam. There is no pulmonary edema. The visualized skeletal structures are unremarkable. IMPRESSION: Previously noted right pneumothorax is not seen on current exam. Patchy consolidation of left lung base is identified, improved compared prior exam. Electronically Signed   By: Abelardo Diesel M.D.   On: 12/31/2018 15:08   I   I have independently reviewed the above radiology findings and reviewed findings  with the patient.  Recent Labs: Lab Results  Component Value Date   WBC 6.6 02/09/2019   HGB 10.8 (L) 02/09/2019   HCT 35.2 (L) 02/09/2019   PLT 193 02/09/2019   GLUCOSE 128 (H) 02/09/2019   CHOL 161 05/06/2018   TRIG 134.0 05/06/2018   HDL 54.20 05/06/2018   LDLDIRECT 131.7 12/27/2009   LDLCALC 80 05/06/2018   ALT 23 02/06/2019   AST 21 02/06/2019   NA 140 02/09/2019   K 4.0 02/09/2019   CL 107 02/09/2019   CREATININE 0.68 02/11/2019   BUN 14 02/09/2019   CO2 28 02/09/2019   TSH 1.24 05/06/2018   INR 1.1 11/22/2018   HGBA1C 5.4 05/06/2018   Wt Readings from Last 3 Encounters:  03/27/19 151 lb (68.5 kg)  03/05/19 143 lb (64.9 kg)  02/25/19 146 lb 11.2 oz (66.5 kg)     Assessment / Plan:   Some improvement with esophageal dilatation and placement of  stent although he has had trouble with slippage of the stent  We will continue with tube feedings for now    [name] 03/27/2019 9:21 PM

## 2019-03-28 LAB — NOVEL CORONAVIRUS, NAA (HOSP ORDER, SEND-OUT TO REF LAB; TAT 18-24 HRS): SARS-CoV-2, NAA: NOT DETECTED

## 2019-03-31 ENCOUNTER — Ambulatory Visit (HOSPITAL_COMMUNITY): Payer: Medicare Other | Admitting: Certified Registered"

## 2019-03-31 ENCOUNTER — Encounter (HOSPITAL_COMMUNITY): Payer: Self-pay | Admitting: Emergency Medicine

## 2019-03-31 ENCOUNTER — Other Ambulatory Visit: Payer: Self-pay

## 2019-03-31 ENCOUNTER — Encounter (HOSPITAL_COMMUNITY)
Admission: RE | Disposition: A | Discharge status: Home / Self Care | Payer: Self-pay | Source: Home / Self Care | Attending: Internal Medicine

## 2019-03-31 ENCOUNTER — Ambulatory Visit (HOSPITAL_COMMUNITY)
Admission: RE | Admit: 2019-03-31 | Discharge: 2019-03-31 | Disposition: A | Discharge status: Home / Self Care | Payer: Medicare Other | Attending: Internal Medicine | Admitting: Internal Medicine

## 2019-03-31 ENCOUNTER — Ambulatory Visit (HOSPITAL_COMMUNITY): Payer: Medicare Other

## 2019-03-31 DIAGNOSIS — I10 Essential (primary) hypertension: Secondary | ICD-10-CM | POA: Diagnosis not present

## 2019-03-31 DIAGNOSIS — K222 Esophageal obstruction: Secondary | ICD-10-CM

## 2019-03-31 DIAGNOSIS — C160 Malignant neoplasm of cardia: Secondary | ICD-10-CM

## 2019-03-31 DIAGNOSIS — T85528A Displacement of other gastrointestinal prosthetic devices, implants and grafts, initial encounter: Secondary | ICD-10-CM | POA: Diagnosis present

## 2019-03-31 DIAGNOSIS — Z9889 Other specified postprocedural states: Secondary | ICD-10-CM | POA: Diagnosis not present

## 2019-03-31 DIAGNOSIS — Z85028 Personal history of other malignant neoplasm of stomach: Secondary | ICD-10-CM | POA: Insufficient documentation

## 2019-03-31 DIAGNOSIS — Z8509 Personal history of malignant neoplasm of other digestive organs: Secondary | ICD-10-CM | POA: Insufficient documentation

## 2019-03-31 DIAGNOSIS — Z87891 Personal history of nicotine dependence: Secondary | ICD-10-CM | POA: Diagnosis not present

## 2019-03-31 DIAGNOSIS — Z9221 Personal history of antineoplastic chemotherapy: Secondary | ICD-10-CM | POA: Insufficient documentation

## 2019-03-31 DIAGNOSIS — Y838 Other surgical procedures as the cause of abnormal reaction of the patient, or of later complication, without mention of misadventure at the time of the procedure: Secondary | ICD-10-CM | POA: Insufficient documentation

## 2019-03-31 DIAGNOSIS — R1310 Dysphagia, unspecified: Secondary | ICD-10-CM | POA: Diagnosis not present

## 2019-03-31 DIAGNOSIS — Z931 Gastrostomy status: Secondary | ICD-10-CM | POA: Insufficient documentation

## 2019-03-31 DIAGNOSIS — J860 Pyothorax with fistula: Secondary | ICD-10-CM

## 2019-03-31 DIAGNOSIS — T182XXD Foreign body in stomach, subsequent encounter: Secondary | ICD-10-CM

## 2019-03-31 DIAGNOSIS — Z4659 Encounter for fitting and adjustment of other gastrointestinal appliance and device: Secondary | ICD-10-CM

## 2019-03-31 HISTORY — PX: STENT REMOVAL: SHX6421

## 2019-03-31 HISTORY — PX: ESOPHAGOGASTRODUODENOSCOPY (EGD) WITH PROPOFOL: SHX5813

## 2019-03-31 SURGERY — ESOPHAGOGASTRODUODENOSCOPY (EGD) WITH PROPOFOL
Anesthesia: Monitor Anesthesia Care

## 2019-03-31 MED ORDER — PROPOFOL 500 MG/50ML IV EMUL
INTRAVENOUS | Status: DC | PRN
  Administered 2019-03-31 (×4): 20 mg via INTRAVENOUS

## 2019-03-31 MED ORDER — LACTATED RINGERS IV SOLN
INTRAVENOUS | Status: DC
  Administered 2019-03-31: 09:00:00 via INTRAVENOUS

## 2019-03-31 MED ORDER — LIDOCAINE HCL (CARDIAC) PF 100 MG/5ML IV SOSY
PREFILLED_SYRINGE | INTRAVENOUS | Status: DC | PRN
  Administered 2019-03-31 (×2): 50 mg via INTRATRACHEAL

## 2019-03-31 MED ORDER — SODIUM CHLORIDE 0.9 % IV SOLN: INTRAVENOUS | Status: DC

## 2019-03-31 MED ORDER — PROPOFOL 10 MG/ML IV BOLUS
INTRAVENOUS | Status: AC
  Filled 2019-03-31: qty 20

## 2019-03-31 MED ORDER — PROPOFOL 500 MG/50ML IV EMUL
INTRAVENOUS | Status: DC | PRN
  Administered 2019-03-31: 135 ug/kg/min via INTRAVENOUS

## 2019-03-31 SURGICAL SUPPLY — 14 items

## 2019-03-31 NOTE — H&P (View-Only) (Signed)
[facility] History and Physical   Primary Care Physician:  Binnie Rail, MD   Reason for Procedure:   remove and replace stent  Plan:    Upper endoscopy with esophageal dilation, stent removal and replacement     HPI: [name] [name] is a 68 y.o. male   He is s/p chemo/XRT and resection of a GE junction cancer with recurrent stricture problems after surgery. Has been treated with dilation and stenting 3x. 18 mm stent has slipped again but making progress on the stricture and is still on full liquid diet after a period of time w/o being able to swallow at all.   Past Medical History:  Diagnosis Date  . Adenocarcinoma of gastroesophageal junction ([place]) 07/29/2018  . Anemia   . Diverticulosis   . Family history of cancer   . Hyperlipidemia   . Hypertension   . Pre-diabetes    but patient's wife states he has never been told that  . Tobacco abuse     Past Surgical History:  Procedure Laterality Date  . BALLOON DILATION N/A 02/06/2019   Procedure: BALLOON DILATION;  Surgeon: Gatha Mayer, MD;  Location: [facility];  Service: Endoscopy;  Laterality: N/A;  . BALLOON DILATION N/A 03/11/2019   Procedure: BALLOON DILATION;  Surgeon: Gatha Mayer, MD;  [facility];  Service: Endoscopy;  Laterality: N/A;  . BIOPSY  02/06/2019   Procedure: BIOPSY;  Surgeon: Gatha Mayer, MD;  [facility];  Service: Endoscopy;;  . COLONOSCOPY    . COMPLETE ESOPHAGECTOMY N/A 11/25/2018   Procedure: TRANSHIATAL TOTAL ESOPHAGECTOMY COMPLETE WITH CERVICAL ESOPHAGOGASTROSTOMY;  Surgeon: [name], MD;  Location: [place];  Service: Thoracic;  Laterality: N/A;  . ESOPHAGEAL STENT PLACEMENT N/A 03/11/2019   Procedure: ESOPHAGEAL STENT PLACEMENT;  Surgeon: Gatha Mayer, MD;  [facility];  Service: Endoscopy;  Laterality: N/A;  . ESOPHAGOGASTRODUODENOSCOPY (EGD) WITH PROPOFOL N/A 02/06/2019   Procedure: ESOPHAGOGASTRODUODENOSCOPY (EGD) WITH  PROPOFOL;  Surgeon: Gatha Mayer, MD;  [facility];  Service: Endoscopy;  Laterality: N/A;  needs fluoro  . ESOPHAGOGASTRODUODENOSCOPY (EGD) WITH PROPOFOL N/A 03/11/2019   Procedure: ESOPHAGOGASTRODUODENOSCOPY (EGD) WITH PROPOFOL;  Surgeon: Gatha Mayer, MD;  [facility];  Service: Endoscopy;  Laterality: N/A;  need fluoro  . ESOPHAGOGASTRODUODENOSCOPY (EGD) WITH PROPOFOL N/A 03/21/2019   Procedure: ESOPHAGOGASTRODUODENOSCOPY (EGD) WITH PROPOFOL;  Surgeon: Gatha Mayer, MD;  [facility];  Service: Endoscopy;  Laterality: N/A;  . FOREIGN BODY REMOVAL  03/21/2019   Procedure: FOREIGN BODY REMOVAL;  Surgeon: Gatha Mayer, MD;  [facility];  Service: Endoscopy;;  stent in stomach  . IR REPLC DUODEN/JEJUNO TUBE PERCUT W/FLUORO  12/20/2018  . JEJUNOSTOMY N/A 11/25/2018   Procedure: FEEDING JEJUNOSTOMY;  Surgeon: [name], MD;  Location: [facility];  Service: Thoracic;  Laterality: N/A;  . NO PAST SURGERIES    . PARASTERNAL EXPLORATION Left 12/02/2018   Procedure: LEFT NECK EXPLORATION;  Surgeon: [name], MD;  Location: [place];  Service: Thoracic;  Laterality: Left;  . PYLOROPLASTY  11/25/2018   Procedure: Pyloroplasty;  Surgeon: [name], MD;  Location: [place];  Service: Thoracic;;  . Azzie Almas DILATION N/A 03/11/2019   Procedure: Azzie Almas DILATION;  Surgeon: Gatha Mayer, MD;  Location: [facility];  Service: Endoscopy;  Laterality: N/A;  . SAVORY DILATION N/A 03/21/2019   Procedure: SAVORY DILATION;  Surgeon: Gatha Mayer, MD;  [facility];  Service: Endoscopy;  Laterality: N/A;  .  VIDEO BRONCHOSCOPY N/A 11/25/2018   Procedure: VIDEO BRONCHOSCOPY;  Surgeon: [name], MD;  Location: [facility] OR;  Service: Thoracic;  Laterality: N/A;  . WISDOM TOOTH EXTRACTION      Prior to Admission medications   Medication Sig Start Date End Date Taking? Authorizing Provider  Nutritional Supplements (FEEDING SUPPLEMENT, OSMOLITE  1.5 CAL,) LIQD Place 237 mLs into feeding tube continuous. Uses 7 cans over 24 hours   Yes [provider]  oxyCODONE (ROXICODONE) 5 MG/5ML solution Take 5 mLs (5 mg total) by mouth every 4 (four) hours as needed for up to 7 days for moderate pain or severe pain. 03/27/19 04/03/19 Yes [name], MD  Hydrocortisone ([name]'S BUTT CREAM) CREA Apply 1 application topically 2 (two) times daily as needed for irritation (apply around g tube). Patient taking differently: Apply 1 application topically daily.  03/27/19   [name], MD    Current Facility-Administered Medications  Medication Dose Route Frequency Provider Last Rate Last Dose  . lactated ringers infusion   Intravenous Continuous Gatha Mayer, MD 10 mL/hr at 03/31/19 0919     Facility-Administered Medications Ordered in Other Encounters  Medication Dose Route Frequency Provider Last Rate Last Dose  . alum & mag hydroxide-simeth (MAALOX/MYLANTA) 200-200-20 MG/5ML suspension 30 mL  30 mL Oral Once Harle Stanford., PA-C       And  . lidocaine (XYLOCAINE) 2 % viscous mouth solution 15 mL  15 mL Oral Once Harle Stanford., PA-C        Allergies as of 03/26/2019  . (No Known Allergies)    Family History  Problem Relation Age of Onset  . Cancer Mother        bone cancer  . Heart attack Father 101  . Cancer Sister 51       unk type  . Heart attack Paternal Uncle        X2, both < 55  . COPD Neg Hx   . Diabetes Neg Hx   . Stroke Neg Hx   . Colon cancer Neg Hx     Social History   Social History Narrative   Retired   Psychologist, counselling for exercise.   Working for [employer] [phone]). Enjoys volunteering.    Former smoker, rare EtOH     Review of Systems:  All other review of systems negative except as mentioned in the HPI.  Physical Exam: Vital signs in last 24 hours: Temp:  [98.2 F (36.8 C)] 98.2 F (36.8 C) (10/12 0910) Pulse Rate:  [58] 58 (10/12 0910) Resp:  [24] 24 (10/12 0910) BP:  (148)/(71) 148/71 (10/12 0910) SpO2:  [100 %] 100 % (10/12 0910) Weight:  [68.5 kg] 68.5 kg (10/12 0910)   General:   Alert,  Well-developed, well-nourished, pleasant and cooperative in NAD Lungs:  Clear throughout to auscultation.   Heart:  Regular rate and rhythm; no murmurs, clicks, rubs,  or gallops. Abdomen:  Soft, nontender and nondistended. Normal bowel sounds.  J tube in place Neuro/Psych:  Alert and cooperative. Normal mood and affect. A and O x 3   @Carl  Simonne Maffucci, MD, [facility] [phone] (pager) 03/31/2019 9:51 AM@

## 2019-03-31 NOTE — H&P (Signed)
[facility] History and Physical   Primary Care Physician:  Binnie Rail, MD   Reason for Procedure:   remove and replace stent  Plan:    Upper endoscopy with esophageal dilation, stent removal and replacement     HPI: [name] [name] is a 68 y.o. male   He is s/p chemo/XRT and resection of a GE junction cancer with recurrent stricture problems after surgery. Has been treated with dilation and stenting 3x. 18 mm stent has slipped again but making progress on the stricture and is still on full liquid diet after a period of time w/o being able to swallow at all.   Past Medical History:  Diagnosis Date  . Adenocarcinoma of gastroesophageal junction ([place]) 07/29/2018  . Anemia   . Diverticulosis   . Family history of cancer   . Hyperlipidemia   . Hypertension   . Pre-diabetes    but patient's wife states he has never been told that  . Tobacco abuse     Past Surgical History:  Procedure Laterality Date  . BALLOON DILATION N/A 02/06/2019   Procedure: BALLOON DILATION;  Surgeon: Gatha Mayer, MD;  Location: [facility];  Service: Endoscopy;  Laterality: N/A;  . BALLOON DILATION N/A 03/11/2019   Procedure: BALLOON DILATION;  Surgeon: Gatha Mayer, MD;  Location: [facility];  Service: Endoscopy;  Laterality: N/A;  . BIOPSY  02/06/2019   Procedure: BIOPSY;  Surgeon: Gatha Mayer, MD;  Location: [facility];  Service: Endoscopy;;  . COLONOSCOPY    . COMPLETE ESOPHAGECTOMY N/A 11/25/2018   Procedure: TRANSHIATAL TOTAL ESOPHAGECTOMY COMPLETE WITH CERVICAL ESOPHAGOGASTROSTOMY;  Surgeon: [name], MD;  Location: [place];  Service: Thoracic;  Laterality: N/A;  . ESOPHAGEAL STENT PLACEMENT N/A 03/11/2019   Procedure: ESOPHAGEAL STENT PLACEMENT;  Surgeon: Gatha Mayer, MD;  Location: [facility];  Service: Endoscopy;  Laterality: N/A;  . ESOPHAGOGASTRODUODENOSCOPY (EGD) WITH PROPOFOL N/A 02/06/2019   Procedure: ESOPHAGOGASTRODUODENOSCOPY (EGD) WITH  PROPOFOL;  Surgeon: Gatha Mayer, MD;  Location: [facility];  Service: Endoscopy;  Laterality: N/A;  needs fluoro  . ESOPHAGOGASTRODUODENOSCOPY (EGD) WITH PROPOFOL N/A 03/11/2019   Procedure: ESOPHAGOGASTRODUODENOSCOPY (EGD) WITH PROPOFOL;  Surgeon: Gatha Mayer, MD;  Location: [facility];  Service: Endoscopy;  Laterality: N/A;  need fluoro  . ESOPHAGOGASTRODUODENOSCOPY (EGD) WITH PROPOFOL N/A 03/21/2019   Procedure: ESOPHAGOGASTRODUODENOSCOPY (EGD) WITH PROPOFOL;  Surgeon: Gatha Mayer, MD;  Location: [facility];  Service: Endoscopy;  Laterality: N/A;  . FOREIGN BODY REMOVAL  03/21/2019   Procedure: FOREIGN BODY REMOVAL;  Surgeon: Gatha Mayer, MD;  Location: [facility];  Service: Endoscopy;;  stent in stomach  . IR REPLC DUODEN/JEJUNO TUBE PERCUT W/FLUORO  12/20/2018  . JEJUNOSTOMY N/A 11/25/2018   Procedure: FEEDING JEJUNOSTOMY;  Surgeon: [name], MD;  Location: [facility] OR;  Service: Thoracic;  Laterality: N/A;  . NO PAST SURGERIES    . PARASTERNAL EXPLORATION Left 12/02/2018   Procedure: LEFT NECK EXPLORATION;  Surgeon: [name], MD;  Location: [place];  Service: Thoracic;  Laterality: Left;  . PYLOROPLASTY  11/25/2018   Procedure: Pyloroplasty;  Surgeon: [name], MD;  Location: [place];  Service: Thoracic;;  . Azzie Almas DILATION N/A 03/11/2019   Procedure: Azzie Almas DILATION;  Surgeon: Gatha Mayer, MD;  Location: [facility];  Service: Endoscopy;  Laterality: N/A;  . SAVORY DILATION N/A 03/21/2019   Procedure: SAVORY DILATION;  Surgeon: Gatha Mayer, MD;  Location: [facility];  Service: Endoscopy;  Laterality: N/A;  .  VIDEO BRONCHOSCOPY N/A 11/25/2018   Procedure: VIDEO BRONCHOSCOPY;  Surgeon: [name], MD;  Location: [facility] OR;  Service: Thoracic;  Laterality: N/A;  . WISDOM TOOTH EXTRACTION      Prior to Admission medications   Medication Sig Start Date End Date Taking? Authorizing Provider  Nutritional Supplements (FEEDING SUPPLEMENT, OSMOLITE  1.5 CAL,) LIQD Place 237 mLs into feeding tube continuous. Uses 7 cans over 24 hours   Yes [provider]  oxyCODONE (ROXICODONE) 5 MG/5ML solution Take 5 mLs (5 mg total) by mouth every 4 (four) hours as needed for up to 7 days for moderate pain or severe pain. 03/27/19 04/03/19 Yes [name], MD  Hydrocortisone ([name]'S BUTT CREAM) CREA Apply 1 application topically 2 (two) times daily as needed for irritation (apply around g tube). Patient taking differently: Apply 1 application topically daily.  03/27/19   [name], MD    Current Facility-Administered Medications  Medication Dose Route Frequency Provider Last Rate Last Dose  . lactated ringers infusion   Intravenous Continuous Gatha Mayer, MD 10 mL/hr at 03/31/19 0919     Facility-Administered Medications Ordered in Other Encounters  Medication Dose Route Frequency Provider Last Rate Last Dose  . alum & mag hydroxide-simeth (MAALOX/MYLANTA) 200-200-20 MG/5ML suspension 30 mL  30 mL Oral Once Harle Stanford., PA-C       And  . lidocaine (XYLOCAINE) 2 % viscous mouth solution 15 mL  15 mL Oral Once Harle Stanford., PA-C        Allergies as of 03/26/2019  . (No Known Allergies)    Family History  Problem Relation Age of Onset  . Cancer Mother        bone cancer  . Heart attack Father 32  . Cancer Sister 35       unk type  . Heart attack Paternal Uncle        X2, both < 55  . COPD Neg Hx   . Diabetes Neg Hx   . Stroke Neg Hx   . Colon cancer Neg Hx     Social History   Social History Narrative   Retired   Psychologist, counselling for exercise.   Working for [employer] [id number]). Enjoys volunteering.    Former smoker, rare EtOH     Review of Systems:  All other review of systems negative except as mentioned in the HPI.  Physical Exam: Vital signs in last 24 hours: Temp:  [98.2 F (36.8 C)] 98.2 F (36.8 C) (10/12 0910) Pulse Rate:  [58] 58 (10/12 0910) Resp:  [24] 24 (10/12 0910) BP:  (148)/(71) 148/71 (10/12 0910) SpO2:  [100 %] 100 % (10/12 0910) Weight:  [68.5 kg] 68.5 kg (10/12 0910)   General:   Alert,  Well-developed, well-nourished, pleasant and cooperative in NAD Lungs:  Clear throughout to auscultation.   Heart:  Regular rate and rhythm; no murmurs, clicks, rubs,  or gallops. Abdomen:  Soft, nontender and nondistended. Normal bowel sounds.  J tube in place Neuro/Psych:  Alert and cooperative. Normal mood and affect. A and O x 3   @Rosario Duey  Simonne Maffucci, MD, [facility] [phone] (pager) 03/31/2019 9:51 AM@

## 2019-03-31 NOTE — Transfer of Care (Signed)
Immediate Anesthesia Transfer of Care Note  Patient: [name] [name]  Procedure(s) Performed: ESOPHAGOGASTRODUODENOSCOPY (EGD) WITH PROPOFOL (N/A ) STENT REMOVAL  Patient Location: Endoscopy Unit  Anesthesia Type:MAC  Level of Consciousness: awake, oriented and patient cooperative  Airway & Oxygen Therapy: Patient Spontanous Breathing and Patient connected to face mask oxygen  Post-op Assessment: Report given to RN and Post -op Vital signs reviewed and stable  Post vital signs: Reviewed and stable  Last Vitals:  Vitals Value Taken Time  BP 114/59 03/31/19 1046  Temp 36.5 C 03/31/19 1046  Pulse 55 03/31/19 1048  Resp 22 03/31/19 1048  SpO2 100 % 03/31/19 1048  Vitals shown include unvalidated device data.  Last Pain:  Vitals:   03/31/19 1046  TempSrc: Temporal  PainSc: 0-No pain         Complications: No apparent anesthesia complications

## 2019-03-31 NOTE — Op Note (Signed)
[facility] Patient Name: [name] [name] Procedure Date: 03/31/2019 MRN: [id number] Attending MD: Gatha Mayer , MD Date of Birth: 1951-01-02 CSN: [id number] Age: 68 Admit Type: Outpatient Procedure:                Upper GI endoscopy Indications:              Dysphagia, Stent removal Providers:                Gatha Mayer, MD, Cleda Daub, RN, Cherylynn Ridges, Technician, Lazaro Arms, Technician Referring MD:             Binnie Rail, MD, [name]. Kirstie Mirza                            [name] Medicines:                Propofol per Anesthesia, Monitored Anesthesia Care Complications:            No immediate complications. Estimated Blood Loss:     Estimated blood loss was minimal. Procedure:                Pre-Anesthesia Assessment:                           - Prior to the procedure, a History and Physical                            was performed, and patient medications and                            allergies were reviewed. The patient's tolerance of                            previous anesthesia was also reviewed. The risks                            and benefits of the procedure and the sedation                            options and risks were discussed with the patient.                            All questions were answered, and informed consent                            was obtained. Prior Anticoagulants: The patient has                            taken no previous anticoagulant or antiplatelet                            agents. ASA Grade Assessment: II - A patient with  mild systemic disease. After reviewing the risks                            and benefits, the patient was deemed in                            satisfactory condition to undergo the procedure.                           After obtaining informed consent, the endoscope was                            passed under direct vision.  Throughout the                            procedure, the patient's blood pressure, pulse, and                            oxygen saturations were monitored continuously. The                            [id number]-[id number]) Olympus gastroscope was                            introduced through the mouth, and advanced to the                            antrum of the stomach. The upper GI endoscopy was                            accomplished without difficulty. The patient                            tolerated the procedure well. Scope In: Scope Out: Findings:      One moderate stenosis was found in the proximal esophagus. This stenosis       measured 1.3 cm (inner diameter) x less than one cm (in length). The       stenosis was traversed.      A metal stent was found in the gastric antrum. Stent removal was       accomplished with a Raptor grasping device. Verification of patient       identification for the specimen was done. Estimated blood loss was       minimal.      An esophago-gastric anastomosis was found in the middle third of the       esophagus, in the lower third of the esophagus, at the gastroesophageal       junction and in the cardia. Impression:               - Esophageal stenosis.                           - Pre-existing gastric stent, removed.                           - An esophago-gastric  anastomosis was found.                           Since the stricture was open I elected not to place                            a larger stent. i am noyt sure how much more                            dilation is possible here. Moderate Sedation:      Not Applicable - Patient had care per Anesthesia. Recommendation:           - Patient has a contact number available for                            emergencies. The signs and symptoms of potential                            delayed complications were discussed with the                            patient. Return to normal activities tomorrow.                             Written discharge instructions were provided to the                            patient.                           - Clear liquids x 1 hour then step 2 stent diet                            rest of day. Start step 3 stent diet tomorrow.                           - Continue present medications.                           We will talk later in week to reassess.                           Can always rescope and redilate if needed. i am                            hopeful that since the stricture has remained open                            that this is currently a benign stricture. I did                            not see any suspiciouls areas and elected not to  biopsy in this setting. Procedure Code(s):        --- Professional ---                           [phone], Esophagoscopy, flexible, transoral; with                            removal of foreign body(s) Diagnosis Code(s):        --- Professional ---                           K22.2, Esophageal obstruction                           Z97.8, Presence of other specified devices                           Z98.890, Other specified postprocedural states                           R13.10, Dysphagia, unspecified                           Z46.59, Encounter for fitting and adjustment of                            other gastrointestinal appliance and device CPT copyright 2019 American Medical Association. All rights reserved. The codes documented in this report are preliminary and upon coder review may  be revised to meet current compliance requirements. Gatha Mayer, MD 03/31/2019 10:55:50 AM This report has been signed electronically. Number of Addenda: 0

## 2019-03-31 NOTE — Discharge Instructions (Signed)
° °  The stricture was really open as much as I would want it to be so I removed the stent and did not put the other one in.  Let's touch base later in the week and figure out a follow-up plan.  I think you should do liquids then step 2 stent diet today and try the step 3 diet later in the week.  I appreciate the opportunity to care for you. YOU HAD AN ENDOSCOPIC PROCEDURE TODAY: Refer to the procedure report and other information in the discharge instructions given to you for any specific questions about what was found during the examination. If this information does not answer your questions, please call Dr. Celesta Aver office at [phone] to clarify.   YOU SHOULD EXPECT: Some feelings of bloating in the abdomen. Passage of more gas than usual. Walking can help get rid of the air that was put into your GI tract during the procedure and reduce the bloating. If you had a lower endoscopy (such as a colonoscopy or flexible sigmoidoscopy) you may notice spotting of blood in your stool or on the toilet paper. Some abdominal soreness may be present for a day or two, also.  DIET:  Clear liquids until noon  Then step 2 stent diet  Try step 3 stent diet tomorrow or next day    ACTIVITY: Your care partner should take you home directly after the procedure. You should plan to take it easy, moving slowly for the rest of the day. You can resume normal activity the day after the procedure however YOU SHOULD NOT DRIVE, use power tools, machinery or perform tasks that involve climbing or major physical exertion for 24 hours (because of the sedation medicines used during the test).   SYMPTOMS TO REPORT IMMEDIATELY: A gastroenterologist can be reached at any hour. Please call [phone]  for any of the following symptoms:  Following lower endoscopy (colonoscopy, flexible sigmoidoscopy) Excessive amounts of blood in the stool  Significant tenderness, worsening of abdominal pains  Swelling of the  abdomen that is new, acute  Fever of 100 or higher  Following upper endoscopy (EGD, EUS, ERCP, esophageal dilation) Vomiting of blood or coffee ground material  New, significant abdominal pain  New, significant chest pain or pain under the shoulder blades  Painful or persistently difficult swallowing  New shortness of breath  Black, tarry-looking or red, bloody stools  FOLLOW UP:  If any biopsies were taken you will be contacted by phone or by letter within the next 1-3 weeks. Call [phone]  if you have not heard about the biopsies in 3 weeks.  Please also call with any specific questions about appointments or follow up tests.

## 2019-03-31 NOTE — Anesthesia Preprocedure Evaluation (Signed)
Anesthesia Evaluation  Patient identified by MRN, date of birth, ID band Patient awake    Reviewed: Allergy & Precautions, NPO status , Patient's Chart, lab work & pertinent test results  History of Anesthesia Complications Negative for: history of anesthetic complications  Airway Mallampati: II  TM Distance: >3 FB Neck ROM: Full    Dental  (+) Teeth Intact, Dental Advisory Given   Pulmonary neg recent URI, former smoker,  Quit smoking 06/2018, 12.5 pack years   breath sounds clear to auscultation       Cardiovascular hypertension,  Rhythm:Regular Rate:Normal     Neuro/Psych negative neurological ROS  negative psych ROS   GI/Hepatic Neg liver ROS, Diverticulosis  Adenocarcinoma of GE junction s/p esophagectomy 11/2018  Attempted esophageal dilation of stricture 01/2019 and perforated, was hospitalized for one week. Has not had anything by mouth since then  J tube   Endo/Other  Pre-diabetic  Renal/GU negative Renal ROS   BPH    Musculoskeletal negative musculoskeletal ROS (+)   Abdominal   Peds  Hematology  (+) anemia ,   Anesthesia Other Findings   Reproductive/Obstetrics negative OB ROS                             Anesthesia Physical Anesthesia Plan  ASA: II  Anesthesia Plan: MAC   Post-op Pain Management:    Induction: Intravenous  PONV Risk Score and Plan: 1 and Treatment may vary due to age or medical condition and Propofol infusion  Airway Management Planned: Nasal Cannula  Additional Equipment: None  Intra-op Plan:   Post-operative Plan:   Informed Consent: I have reviewed the patients History and Physical, chart, labs and discussed the procedure including the risks, benefits and alternatives for the proposed anesthesia with the patient or authorized representative who has indicated his/her understanding and acceptance.     Dental advisory given  Plan  Discussed with: CRNA and Surgeon  Anesthesia Plan Comments:         Anesthesia Quick Evaluation

## 2019-04-01 ENCOUNTER — Encounter (HOSPITAL_COMMUNITY): Payer: Self-pay | Admitting: Internal Medicine

## 2019-04-01 NOTE — Anesthesia Postprocedure Evaluation (Signed)
Anesthesia Post Note  Patient: [name] [name]  Procedure(s) Performed: ESOPHAGOGASTRODUODENOSCOPY (EGD) WITH PROPOFOL (N/A ) STENT REMOVAL     Patient location during evaluation: PACU Anesthesia Type: MAC Level of consciousness: awake and alert Pain management: pain level controlled Vital Signs Assessment: post-procedure vital signs reviewed and stable Respiratory status: spontaneous breathing, nonlabored ventilation, respiratory function stable and patient connected to nasal cannula oxygen Cardiovascular status: stable and blood pressure returned to baseline Postop Assessment: no apparent nausea or vomiting Anesthetic complications: no    Last Vitals:  Vitals:   03/31/19 1100 03/31/19 1110  BP: 130/69 134/76  Pulse: (!) 58 (!) 52  Resp: (!) 21 (!) 24  Temp:    SpO2: 100% 98%    Last Pain:  Vitals:   03/31/19 1110  TempSrc:   PainSc: 0-No pain                 Telena Peyser

## 2019-04-04 ENCOUNTER — Other Ambulatory Visit: Payer: Self-pay | Admitting: Cardiothoracic Surgery

## 2019-04-04 DIAGNOSIS — C155 Malignant neoplasm of lower third of esophagus: Secondary | ICD-10-CM

## 2019-04-04 MED ORDER — OXYCODONE HCL 5 MG/5ML PO SOLN: 5.0000 mg | ORAL | 0 refills | Status: DC | PRN

## 2019-04-08 ENCOUNTER — Other Ambulatory Visit: Payer: Self-pay

## 2019-04-08 ENCOUNTER — Inpatient Hospital Stay: Payer: Medicare Other | Attending: Nurse Practitioner | Admitting: Oncology

## 2019-04-08 VITALS — BP 114/65 | HR 58 | Temp 99.2°F | Resp 18 | Ht 71.0 in | Wt 154.9 lb

## 2019-04-08 DIAGNOSIS — G8929 Other chronic pain: Secondary | ICD-10-CM | POA: Diagnosis not present

## 2019-04-08 DIAGNOSIS — M549 Dorsalgia, unspecified: Secondary | ICD-10-CM | POA: Insufficient documentation

## 2019-04-08 DIAGNOSIS — Z23 Encounter for immunization: Secondary | ICD-10-CM | POA: Diagnosis not present

## 2019-04-08 DIAGNOSIS — Z8719 Personal history of other diseases of the digestive system: Secondary | ICD-10-CM | POA: Diagnosis not present

## 2019-04-08 DIAGNOSIS — C7951 Secondary malignant neoplasm of bone: Secondary | ICD-10-CM | POA: Diagnosis not present

## 2019-04-08 DIAGNOSIS — E785 Hyperlipidemia, unspecified: Secondary | ICD-10-CM | POA: Diagnosis not present

## 2019-04-08 DIAGNOSIS — D509 Iron deficiency anemia, unspecified: Secondary | ICD-10-CM | POA: Insufficient documentation

## 2019-04-08 DIAGNOSIS — I10 Essential (primary) hypertension: Secondary | ICD-10-CM | POA: Diagnosis not present

## 2019-04-08 DIAGNOSIS — C155 Malignant neoplasm of lower third of esophagus: Secondary | ICD-10-CM | POA: Insufficient documentation

## 2019-04-08 MED ORDER — INFLUENZA VAC A&B SA ADJ QUAD 0.5 ML IM PRSY
PREFILLED_SYRINGE | INTRAMUSCULAR | Status: AC
  Filled 2019-04-08: qty 0.5

## 2019-04-08 MED ORDER — INFLUENZA VAC A&B SA ADJ QUAD 0.5 ML IM PRSY
0.5000 mL | PREFILLED_SYRINGE | Freq: Once | INTRAMUSCULAR | Status: AC
  Administered 2019-04-08: 0.5 mL via INTRAMUSCULAR

## 2019-04-08 NOTE — Progress Notes (Signed)
[place] OFFICE PROGRESS NOTE   Diagnosis: Esophagus cancer  INTERVAL HISTORY:   [name] [name] returns as scheduled.  He is on repeat upper endoscopy procedures by Dr. [name] over the past 2 months.  This included placement of an esophageal stent.  The stent migrated into the stomach and was removed on 2 occasions.  The stricture proved following these procedures.  Dr. [name] elected not to place a larger stent at a repeat endoscopy on 03/31/2019.  [name] [name] is tolerating liquids and some solids.  He plans to wean himself off of tube feedings.  He has tenderness at the feeding tube site.  He reports chronic back pain.  The back pain is generally relieved with stretching exercises.  He is not able to stretch with the feeding tube in place.  He reports pain at the mid upper back for the past few days.  The pain is associated with movement.  No neurologic symptoms.  Objective:  Vital signs in last 24 hours:  Blood pressure 114/65, pulse (!) 58, temperature 99.2 F (37.3 C), temperature source Temporal, resp. rate 18, height '5\' 11"'  (1.803 m), weight 154 lb 14.4 oz (70.3 kg), SpO2 98 %.    HEENT: Neck without mass Lymphatics: No cervical, supraclavicular, or axillary nodes GI: No hepatomegaly, no mass, nontender, left upper quadrant feeding tube site without evidence of infection Vascular: No leg edema Musculoskeletal: No tenderness at the upper back    Lab Results:  Lab Results  Component Value Date   WBC 6.6 02/09/2019   HGB 10.8 (L) 02/09/2019   HCT 35.2 (L) 02/09/2019   MCV 91.2 02/09/2019   PLT 193 02/09/2019   NEUTROABS 5.2 02/09/2019    CMP  Lab Results  Component Value Date   NA 140 02/09/2019   K 4.0 02/09/2019   CL 107 02/09/2019   CO2 28 02/09/2019   GLUCOSE 128 (H) 02/09/2019   BUN 14 02/09/2019   CREATININE 0.68 02/11/2019   CALCIUM 8.5 (L) 02/09/2019   PROT 7.0 02/06/2019   ALBUMIN 2.7 (L) 02/09/2019   AST 21 02/06/2019   ALT 23  02/06/2019   ALKPHOS 108 02/06/2019   BILITOT 0.6 02/06/2019   GFRNONAA >60 02/11/2019   GFRAA >60 02/11/2019     Medications: I have reviewed the patient's current medications.   Assessment/Plan: 1. 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 Upper endoscopy 07/25/2018-esophageal mass at 38-41 cm extending to the GE junction and gastric cardia, biopsy confirmed adenocarcinoma ? CTs 08/01/2018-wall thickening at the distal esophagus extending to the GE junction and gastric cardia, gastrohepatic ligament lymphadenopathy, perigastric lymph node, too small to characterize liver lesion, bilateral adrenal adenomas ? PET scan 08/09/2018-hypermetabolic mass spanning the gastroesophageal junction. Clustered gastrohepatic ligament lymph nodes likely involved with maximum SUV 3.2. ? Radiation 08/21/2018-09/27/2018  ? Cycle 1 weekly Taxol/carboplatin 08/21/2018 ? Cycle 2 weekly Taxol/carboplatin 08/27/2018 ? Cycle 3 weekly Taxol/carboplatin 09/03/2018 ? Cycle 4 weekly Taxol/carboplatin 09/10/2018 ? Cycle 5 weekly Taxol/carboplatin 09/17/2018 ? CTs 10/24/2018-gastric cardia portion of the gastroesophageal mass less striking than on the 08/09/2018 exam. Adjacent gastrohepatic ligament adenopathy is still present with clustered nodes measuring 1.2 cm. Aortocaval node measures 0.8 cm, formerly the same, not formally hypermetabolic. ? Esophagogastrectomy 11/25/2018-moderate to poorly differentiated adenocarcinoma measuring at least 20 cm involving the distal esophagus, GE junction, and proximal stomach. Tumor midpoint and the proximal stomach. Tumor invades the visceral peritoneum. Proximal and distal resection margins are positive, 5/13 lymph nodes positive, lymphovascular and perineural invasion  present, absent treatment effect, pT4a,pN2;foundation 1-microsatellite stable, tumor mutational burden 1, BRCA2; PDL 1 combined positive score 1;HER-2/neu negative by IHC ? CT neck 02/06/2019-  extraluminal gas in the mediastinum and right neck, sclerotic lesions at T2, T4, and the left second rib consistent with bone metastases ? CT chest 02/06/2019- pneumomediastinum, proximal esophageal stricture with ill-defined soft tissue, sclerotic lesion at T4, new?Marland Kitchen  A potential new lesion at T2  2. Iron deficiency anemia secondary to #1,improved 3. Colon polyps, tubular adenomas, identified on colonoscopy 07/25/2018 4. Hypertension 5. Hyperlipidemia 6. Tobacco use 7. 02/06/2019 -hospital admission for pneumomediastinum after undergoing an upper endoscopy/dilatation and esophageal biopsy-ulcerated squamous mucosa with no malignancy identified, antibiotics/n.p.o.-repeat esophagram 02/10/2019-persistent narrowing in the proximal esophagus, no leak  Esophagram 02/17/2019-high-grade stricture at the esophagogastric anastomosis, no leak  Repeat upper endoscopy procedures with placement of an esophageal stent September and October 2020, stent migrated into the stomach and was removed, esophageal stricture improved   Disposition: [name] [name] has noted improvement in the esophageal stricture over the past several weeks.  He is now tolerating liquids and some solids.  He has gained weight.  He will continue follow-up with Dr. Servando Snare to decide on removing the feeding tube.  The pain at the upper back may be related to a benign musculoskeletal condition, but CTs in August suggested sclerotic metastases at the thoracic spine.  He will call for increased pain.  He will return for an office visit in 2 months.  Betsy Coder, MD  04/08/2019  12:52 PM

## 2019-04-09 ENCOUNTER — Telehealth: Payer: Self-pay | Admitting: *Deleted

## 2019-04-09 NOTE — Telephone Encounter (Signed)
Calling to request Dr. Benay [name] refill his oxycodone solution--he is out. Called Dr. Liliane Shi office to refill, but he is out of the office all week and they suggest he call Dr. Benay [name].  Informed him that Dr. Benay [name] is out of the office for the rest of the week, but will ask NP, Lattie Haw is she will refill X 1. He reports he is having quit a bit of back pain. Last fill was for 60 ml and Dr. Servando Snare used to give him a larger bottle, but last two fills he has decreased to 60 ml.

## 2019-04-10 ENCOUNTER — Telehealth: Payer: Self-pay | Admitting: Oncology

## 2019-04-10 ENCOUNTER — Other Ambulatory Visit: Payer: Self-pay | Admitting: Nurse Practitioner

## 2019-04-10 DIAGNOSIS — C160 Malignant neoplasm of cardia: Secondary | ICD-10-CM

## 2019-04-10 MED ORDER — OXYCODONE HCL 5 MG/5ML PO SOLN: 5.0000 mg | ORAL | 0 refills | Status: DC | PRN

## 2019-04-10 NOTE — Telephone Encounter (Signed)
I talk with patient regarding schedule  

## 2019-04-16 ENCOUNTER — Telehealth: Payer: Self-pay | Admitting: Internal Medicine

## 2019-04-16 NOTE — Telephone Encounter (Signed)
Unable to swallow again Using J tube  Would like to set up an EGD with dilation and stent placement for Fri Nov 6 at 0730 - need fluoro  23 by 105 mm fully covered stent  Let me know thanks

## 2019-04-16 NOTE — Telephone Encounter (Signed)
Any advice for his continued dysphagia?

## 2019-04-16 NOTE — Telephone Encounter (Signed)
Pt's wife reported that pt is having difficulty swallowing.  Please advise.

## 2019-04-17 ENCOUNTER — Other Ambulatory Visit: Payer: Self-pay | Admitting: Cardiothoracic Surgery

## 2019-04-17 ENCOUNTER — Other Ambulatory Visit: Payer: Self-pay

## 2019-04-17 DIAGNOSIS — C160 Malignant neoplasm of cardia: Secondary | ICD-10-CM

## 2019-04-17 MED ORDER — OXYCODONE HCL 5 MG/5ML PO SOLN: 5.0000 mg | ORAL | 0 refills | Status: DC | PRN

## 2019-04-17 NOTE — Progress Notes (Signed)
Initial renewal for oxycodone liquid was sent to one pharmacy, but pharmacy did not carry it the patient called and notified us , again medication was renewed [facility].

## 2019-04-17 NOTE — Telephone Encounter (Signed)
Patient notified of the recommendations EGD scheduled for 04/25/19 7:30.  He understands to arrive at [facility] endo at 6:00 and be NPO after midnight.  I spoke with Lattie Haw at [facility] endo and she will order the stent.  He understands to go for COVID screen on 04/22/19

## 2019-04-17 NOTE — Progress Notes (Signed)
Patient with chronic pain and being treated for underlying esophageal cancer is given a renewal on oxycodone liquid.

## 2019-04-18 ENCOUNTER — Other Ambulatory Visit: Payer: Self-pay | Admitting: Cardiothoracic Surgery

## 2019-04-18 DIAGNOSIS — C160 Malignant neoplasm of cardia: Secondary | ICD-10-CM

## 2019-04-18 MED ORDER — OXYCODONE HCL 5 MG/5ML PO SOLN: 5.0000 mg | ORAL | 0 refills | Status: DC | PRN

## 2019-04-22 ENCOUNTER — Other Ambulatory Visit: Payer: Self-pay | Admitting: Internal Medicine

## 2019-04-22 ENCOUNTER — Ambulatory Visit (INDEPENDENT_AMBULATORY_CARE_PROVIDER_SITE_OTHER): Payer: Medicare Other | Admitting: Cardiothoracic Surgery

## 2019-04-22 ENCOUNTER — Other Ambulatory Visit: Payer: Self-pay | Admitting: Cardiothoracic Surgery

## 2019-04-22 ENCOUNTER — Other Ambulatory Visit (HOSPITAL_COMMUNITY)
Admission: RE | Admit: 2019-04-22 | Discharge: 2019-04-22 | Disposition: A | Discharge status: Home / Self Care | Payer: Medicare Other | Source: Ambulatory Visit | Attending: Internal Medicine | Admitting: Internal Medicine

## 2019-04-22 ENCOUNTER — Other Ambulatory Visit: Payer: Self-pay

## 2019-04-22 VITALS — BP 148/85 | HR 62 | Temp 97.7°F | Resp 20 | Ht 71.0 in | Wt 148.0 lb

## 2019-04-22 DIAGNOSIS — Z09 Encounter for follow-up examination after completed treatment for conditions other than malignant neoplasm: Secondary | ICD-10-CM

## 2019-04-22 DIAGNOSIS — Z20828 Contact with and (suspected) exposure to other viral communicable diseases: Secondary | ICD-10-CM | POA: Insufficient documentation

## 2019-04-22 DIAGNOSIS — Z01812 Encounter for preprocedural laboratory examination: Secondary | ICD-10-CM | POA: Diagnosis present

## 2019-04-22 DIAGNOSIS — C160 Malignant neoplasm of cardia: Secondary | ICD-10-CM | POA: Diagnosis not present

## 2019-04-22 MED ORDER — OXYCODONE HCL 5 MG/5ML PO SOLN: 5.0000 mg | ORAL | 0 refills | Status: DC | PRN

## 2019-04-22 NOTE — Progress Notes (Signed)
[address]       [place] [address]             [phone]                  [name] [name] [place] Medical Record [id number] Date of Birth: 05-30-1951  Referring [id number], [name], MD Primary Cardiology: Primary Care:Burns, [name], MD  Chief Complaint:  Follow Up Visit  Cancer Staging Adenocarcinoma of gastroesophageal junction [facility]) Staging form: Esophagus - Adenocarcinoma, AJCC 8th Edition - Pathologic stage from 11/28/2018: Stage IVA (ypT4a, pN2, cM0, G2) - Signed by [name], MD on 12/01/2018  OPERATIVE REPORT DATE OF PROCEDURE:  11/25/2018 PREOPERATIVE DIAGNOSIS:  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 SURGICAL PROCEDURE:  Video bronchoscopy, transhiatal total esophagectomy with cervical esophagogastrostomy, pyloroplasty, feeding jejunostomy, bilateral placement of chest tubes. SURGEON:  [name], MD  History of Present Illness:     Patient returns office today for follow-up visit.  Initially with Dr. [name] dilatation and placement of stent in the cervical esophagus the patient was back eating and more comfortable .  He notes that the stent slipped and it was removed , initially after removal of the stent he continued to do okay with swallowing and secretions .  However this point the symptoms have become worse he is on his tube feedings all the time and now unable to swallow secretions .he has had a history of chronic back pain only this has continued to get worse with his limitations on exercise and stretching .  He is scheduled on Friday to have repeat endoscopy and dilatation and stent placement  Zubrod Score: At the time of surgery this patient's most appropriate activity status/level should be described as: []     0    Normal activity, no symptoms []     1    Restricted in physical  strenuous activity but ambulatory, able to do out light work [x]     2    Ambulatory and capable of self care, unable to do work activities, up and about                 >50 % of waking hours                                                                                   []     3    Only limited self care, in bed greater than 50% of waking hours []     4    Completely disabled, no self care, confined to bed or chair []     5    Moribund  Social History   Tobacco Use  Smoking Status Former Smoker  . Packs/day: 0.25  . Years: 50.00  . Pack years: 12.50  . Types: Cigarettes  . Quit date: 06/19/2018  . Years since quitting: 0.8  Smokeless Tobacco Never Used  Tobacco Comment   06/02/14 2-3 pp week       No Known Allergies  Current Outpatient Medications  Medication Sig Dispense Refill  . Hydrocortisone (Adylene Dlugosz'S  BUTT CREAM) CREA Apply 1 application topically 2 (two) times daily as needed for irritation (apply around g tube). (Patient taking differently: Apply 1 application topically daily. ) 1 each 1  . Nutritional Supplements (FEEDING SUPPLEMENT, OSMOLITE 1.5 CAL,) LIQD Place 237 mLs into feeding tube continuous. Uses 7 cans over 24 hours    . oxyCODONE (ROXICODONE) 5 MG/5ML solution Take 5 mLs (5 mg total) by mouth every 4 (four) hours as needed for severe pain. 60 mL 0   No current facility-administered medications for this visit.    Facility-Administered Medications Ordered in Other Visits  Medication Dose Route Frequency Provider Last Rate Last Dose  . alum & mag hydroxide-simeth (MAALOX/MYLANTA) 200-200-20 MG/5ML suspension 30 mL  30 mL Oral Once Harle Stanford., PA-C       And  . lidocaine (XYLOCAINE) 2 % viscous mouth solution 15 mL  15 mL Oral Once Harle Stanford., PA-C           Physical Exam: BP (!) 148/85   Pulse 62   Temp 97.7 F (36.5 C) (Skin)   Resp 20   Ht 5\' 11"  (1.803 m)   Wt 148 lb (67.1 kg)   SpO2 94% Comment: RA  BMI 20.64 kg/m   General appearance:  alert, cooperative, appears stated age and mild distress Head: Normocephalic, without obvious abnormality, atraumatic Neck: no adenopathy, no carotid bruit, no JVD, supple, symmetrical, trachea midline and thyroid not enlarged, symmetric, no tenderness/mass/nodules Lymph nodes: Cervical, supraclavicular, and axillary nodes normal. Resp: clear to auscultation bilaterally Cardio: regular rate and rhythm, S1, S2 normal, no murmur, click, rub or gallop GI: soft, non-tender; bowel sounds normal; no masses,  no organomegaly Extremities: extremities normal, atraumatic, no cyanosis or edema and Homans sign is negative, no sign of DVT Neurologic: Grossly normal Feeding jejunostomy tube is in place  Diagnostic Studies & Laboratory data:         Recent Radiology Findings: None Recent Labs: Lab Results  Component Value Date   WBC 6.6 02/09/2019   HGB 10.8 (L) 02/09/2019   HCT 35.2 (L) 02/09/2019   PLT 193 02/09/2019   GLUCOSE 128 (H) 02/09/2019   CHOL 161 05/06/2018   TRIG 134.0 05/06/2018   HDL 54.20 05/06/2018   LDLDIRECT 131.7 12/27/2009   LDLCALC 80 05/06/2018   ALT 23 02/06/2019   AST 21 02/06/2019   NA 140 02/09/2019   K 4.0 02/09/2019   CL 107 02/09/2019   CREATININE 0.68 02/11/2019   BUN 14 02/09/2019   CO2 28 02/09/2019   TSH 1.24 05/06/2018   INR 1.1 11/22/2018   HGBA1C 5.4 05/06/2018   Wt Readings from Last 3 Encounters:  04/22/19 148 lb (67.1 kg)  04/08/19 154 lb 14.4 oz (70.3 kg)  03/31/19 151 lb (68.5 kg)     Assessment / Plan:   1/Continued anastomotic stricture, will repeat dilatation and stent placement later this week 2/patient continues to be uncomfortable with a combination of chronic back pain inability to exercise, and difficulty laying flat due to retained secretions 3/with the patient's underlying malignancy we will continue with pain medication for his comfort 4/after the dilatation if the low back pain he has continues to get worse we will plan imaging  of the spine to rule out possible metastatic disease  He will return to the office in 3 weeks, but keep [place] posted about his back pain until that time.  [name] 04/22/2019 3:37 PM

## 2019-04-23 LAB — NOVEL CORONAVIRUS, NAA (HOSP ORDER, SEND-OUT TO REF LAB; TAT 18-24 HRS): SARS-CoV-2, NAA: NOT DETECTED

## 2019-04-24 ENCOUNTER — Encounter (HOSPITAL_COMMUNITY): Payer: Self-pay | Admitting: *Deleted

## 2019-04-24 ENCOUNTER — Ambulatory Visit: Payer: Medicare Other | Admitting: Cardiothoracic Surgery

## 2019-04-24 NOTE — Progress Notes (Signed)
Pre op call complete, patient states they have been quarantined since COVID test and denies any symptoms of feeling ill or being around anyone sick. All questions addressed.

## 2019-04-25 ENCOUNTER — Other Ambulatory Visit: Payer: Self-pay

## 2019-04-25 ENCOUNTER — Ambulatory Visit (HOSPITAL_COMMUNITY): Payer: Medicare Other

## 2019-04-25 ENCOUNTER — Ambulatory Visit (HOSPITAL_COMMUNITY)
Admission: RE | Admit: 2019-04-25 | Discharge: 2019-04-25 | Disposition: A | Discharge status: Home / Self Care | Payer: Medicare Other | Attending: Internal Medicine | Admitting: Internal Medicine

## 2019-04-25 ENCOUNTER — Encounter (HOSPITAL_COMMUNITY)
Admission: RE | Disposition: A | Discharge status: Home / Self Care | Payer: Self-pay | Source: Home / Self Care | Attending: Internal Medicine

## 2019-04-25 ENCOUNTER — Ambulatory Visit (HOSPITAL_COMMUNITY): Payer: Medicare Other | Admitting: Certified Registered"

## 2019-04-25 ENCOUNTER — Encounter (HOSPITAL_COMMUNITY): Payer: Self-pay | Admitting: *Deleted

## 2019-04-25 DIAGNOSIS — R7303 Prediabetes: Secondary | ICD-10-CM | POA: Diagnosis not present

## 2019-04-25 DIAGNOSIS — Z87891 Personal history of nicotine dependence: Secondary | ICD-10-CM | POA: Diagnosis not present

## 2019-04-25 DIAGNOSIS — Z980 Intestinal bypass and anastomosis status: Secondary | ICD-10-CM | POA: Insufficient documentation

## 2019-04-25 DIAGNOSIS — Z419 Encounter for procedure for purposes other than remedying health state, unspecified: Secondary | ICD-10-CM

## 2019-04-25 DIAGNOSIS — E785 Hyperlipidemia, unspecified: Secondary | ICD-10-CM | POA: Diagnosis not present

## 2019-04-25 DIAGNOSIS — R1310 Dysphagia, unspecified: Secondary | ICD-10-CM | POA: Diagnosis not present

## 2019-04-25 DIAGNOSIS — C160 Malignant neoplasm of cardia: Secondary | ICD-10-CM

## 2019-04-25 DIAGNOSIS — K222 Esophageal obstruction: Secondary | ICD-10-CM | POA: Diagnosis not present

## 2019-04-25 DIAGNOSIS — Z08 Encounter for follow-up examination after completed treatment for malignant neoplasm: Secondary | ICD-10-CM | POA: Insufficient documentation

## 2019-04-25 DIAGNOSIS — Z9689 Presence of other specified functional implants: Secondary | ICD-10-CM | POA: Diagnosis not present

## 2019-04-25 DIAGNOSIS — Z923 Personal history of irradiation: Secondary | ICD-10-CM | POA: Diagnosis not present

## 2019-04-25 DIAGNOSIS — Z8509 Personal history of malignant neoplasm of other digestive organs: Secondary | ICD-10-CM | POA: Diagnosis not present

## 2019-04-25 DIAGNOSIS — Z934 Other artificial openings of gastrointestinal tract status: Secondary | ICD-10-CM | POA: Diagnosis not present

## 2019-04-25 DIAGNOSIS — I10 Essential (primary) hypertension: Secondary | ICD-10-CM | POA: Diagnosis not present

## 2019-04-25 DIAGNOSIS — Z9221 Personal history of antineoplastic chemotherapy: Secondary | ICD-10-CM | POA: Insufficient documentation

## 2019-04-25 HISTORY — PX: BALLOON DILATION: SHX5330

## 2019-04-25 HISTORY — PX: ESOPHAGOGASTRODUODENOSCOPY (EGD) WITH PROPOFOL: SHX5813

## 2019-04-25 HISTORY — PX: ESOPHAGEAL STENT PLACEMENT: SHX5540

## 2019-04-25 SURGERY — ESOPHAGOGASTRODUODENOSCOPY (EGD) WITH PROPOFOL
Anesthesia: Monitor Anesthesia Care

## 2019-04-25 MED ORDER — PROPOFOL 500 MG/50ML IV EMUL
INTRAVENOUS | Status: DC | PRN
  Administered 2019-04-25: 135 ug/kg/min via INTRAVENOUS

## 2019-04-25 MED ORDER — SODIUM CHLORIDE 0.9 % IV SOLN: INTRAVENOUS | Status: DC

## 2019-04-25 MED ORDER — PROPOFOL 500 MG/50ML IV EMUL
INTRAVENOUS | Status: AC
  Filled 2019-04-25: qty 50

## 2019-04-25 MED ORDER — OXYCODONE HCL 5 MG/5ML PO SOLN: 5.0000 mg | ORAL | 0 refills | Status: DC | PRN

## 2019-04-25 MED ORDER — LIDOCAINE 2% (20 MG/ML) 5 ML SYRINGE
INTRAMUSCULAR | Status: DC | PRN
  Administered 2019-04-25: 40 mg via INTRAVENOUS

## 2019-04-25 MED ORDER — PROPOFOL 10 MG/ML IV BOLUS
INTRAVENOUS | Status: DC | PRN
  Administered 2019-04-25 (×2): 10 mg via INTRAVENOUS

## 2019-04-25 MED ORDER — PROPOFOL 10 MG/ML IV BOLUS
INTRAVENOUS | Status: AC
  Filled 2019-04-25: qty 20

## 2019-04-25 MED ORDER — LACTATED RINGERS IV SOLN
INTRAVENOUS | Status: DC
  Administered 2019-04-25: 07:00:00 via INTRAVENOUS

## 2019-04-25 SURGICAL SUPPLY — 14 items

## 2019-04-25 NOTE — Interval H&P Note (Signed)
History and Physical Interval Note:  04/25/2019 7:31 AM  [name] [name]  has presented today for surgery, with the diagnosis of adenocarcinoma of esophagus.  The various methods of treatment have been discussed with the patient and family. After consideration of risks, benefits and other options for treatment, the patient has consented to  Procedure(s) with comments: ESOPHAGOGASTRODUODENOSCOPY (EGD) WITH PROPOFOL (N/A) BALLOON DILATION (N/A) - need fluoro ESOPHAGEAL STENT PLACEMENT (N/A) - 23 by 105 mm fully fovered as a surgical intervention.  The patient's history has been reviewed, patient examined, no change in status, stable for surgery.  I have reviewed the patient's chart and labs.  Questions were answered to the patient's satisfaction.     [name]  Patient back for repeat dilation and stenting after stent removed in October

## 2019-04-25 NOTE — Op Note (Signed)
[facility] Patient Name: [name] [name] Procedure Date: 04/25/2019 MRN: [id number] Attending MD: Gatha Mayer , MD Date of Birth: 10-Dec-1950 CSN: [id number] Age: 68 Admit Type: Outpatient Procedure:                Upper GI endoscopy Indications:              Dysphagia, For dilation and stenting of esophageal                            stenosis Providers:                Gatha Mayer, MD, Cleda Daub, RN, Elspeth Cho Tech., Technician, Beth Pullium., CRNA Referring MD:             Lilia Argue. Juliane Lack Sherrill Medicines:                Propofol per Anesthesia, Monitored Anesthesia Care Complications:            No immediate complications. Estimated Blood Loss:     Estimated blood loss was minimal. Procedure:                Pre-Anesthesia Assessment:                           - Prior to the procedure, a History and Physical                            was performed, and patient medications and                            allergies were reviewed. The patient's tolerance of                            previous anesthesia was also reviewed. The risks                            and benefits of the procedure and the sedation                            options and risks were discussed with the patient.                            All questions were answered, and informed consent                            was obtained. Prior Anticoagulants: The patient has                            taken no previous anticoagulant or antiplatelet                            agents. ASA Grade Assessment: II - A patient with  mild systemic disease. After reviewing the risks                            and benefits, the patient was deemed in                            satisfactory condition to undergo the procedure.                           After obtaining informed consent, the endoscope was                            passed under  direct vision. Throughout the                            procedure, the patient's blood pressure, pulse, and                            oxygen saturations were monitored continuously. The                            [id number]) Olympus gastroscope was                            introduced through the mouth, and advanced to the                            second part of duodenum. The [id number])                            Olympus ultra slim endoscope was introduced through                            the and advanced to the. The upper GI endoscopy was                            accomplished without difficulty. The patient                            tolerated the procedure well. Scope In: Scope Out: Findings:      One benign-appearing, intrinsic severe (stenosis; an endoscope cannot       pass) stenosis was found 23 cm from the incisors. This stenosis measured       3 mm (inner diameter). The stenosis was traversed after dilation. A       guidewire was placed under fluoroscopic guidance and the scope was       withdrawn. Dilation was performed with a Savary dilator with 8,9,10,11       and 12 mm dilators w/ moderate resistance. The dilation site was       examined following endoscope reinsertion and showed moderate improvement       in luminal narrowing. Estimated blood loss was minimal. This was stented       with a 23 mm x 10.5 cm WallFlex covered stent under fluoroscopic  guidance. Estimated blood loss was minimal.      An esophago-gastric anastomosis was found in the upper third of the       esophagus.      Evidence of an esophagogastrectomy were found in the cardia, in the       gastric fundus and in the gastric body.      The exam was otherwise without abnormality.      The cardia and gastric fundus were normal on retroflexion. Impression:               - Benign-appearing esophageal stenosis. Dilated.                            Prosthesis placed.                            - An esophago-gastric anastomosis was found.                           - An esophagogastrectomy were found.                           - The examination was otherwise normal.                           - No specimens collected. Moderate Sedation:      Not Applicable - Patient had care per Anesthesia. Recommendation:           - Patient has a contact number available for                            emergencies. The signs and symptoms of potential                            delayed complications were discussed with the                            patient. Return to normal activities tomorrow.                            Written discharge instructions were provided to the                            patient.                           - Stent 1 diet.                           - Continue present medications.                           - I will call and advance diet after checking on him Procedure Code(s):        --- Professional ---                           [id number], Esophagogastroduodenoscopy, flexible,  transoral; with placement of endoscopic stent                            (includes pre- and post-dilation and guide wire                            passage, when performed) Diagnosis Code(s):        --- Professional ---                           K22.2, Esophageal obstruction                           Z98.890, Other specified postprocedural states                           R13.10, Dysphagia, unspecified CPT copyright 2019 American Medical Association. All rights reserved. The codes documented in this report are preliminary and upon coder review may  be revised to meet current compliance requirements. Gatha Mayer, MD 04/25/2019 8:30:50 AM This report has been signed electronically. Number of Addenda: 0

## 2019-04-25 NOTE — Anesthesia Postprocedure Evaluation (Signed)
Anesthesia Post Note  Patient: [name] [name]  Procedure(s) Performed: ESOPHAGOGASTRODUODENOSCOPY (EGD) WITH PROPOFOL (N/A ) BALLOON DILATION (N/A ) ESOPHAGEAL STENT PLACEMENT (N/A )     Patient location during evaluation: PACU Anesthesia Type: MAC Level of consciousness: awake and alert Pain management: pain level controlled Vital Signs Assessment: post-procedure vital signs reviewed and stable Respiratory status: spontaneous breathing, nonlabored ventilation, respiratory function stable and patient connected to nasal cannula oxygen Cardiovascular status: stable and blood pressure returned to baseline Postop Assessment: no apparent nausea or vomiting Anesthetic complications: no    Last Vitals:  Vitals:   04/25/19 0845 04/25/19 0850  BP: 135/74 (!) 132/57  Pulse: (!) 51 (!) 51  Resp: (!) 23 14  Temp:    SpO2: 99% 98%    Last Pain:  Vitals:   04/25/19 0850  TempSrc:   PainSc: 0-No pain                 Charisa Twitty S

## 2019-04-25 NOTE — Interval H&P Note (Signed)
History and Physical Interval Note:  04/25/2019 7:30 AM  [name] [name]  has presented today for surgery, with the diagnosis of adenocarcinoma of esophagus.  The various methods of treatment have been discussed with the patient and family. After consideration of risks, benefits and other options for treatment, the patient has consented to  Procedure(s) with comments: ESOPHAGOGASTRODUODENOSCOPY (EGD) WITH PROPOFOL (N/A) BALLOON DILATION (N/A) - need fluoro ESOPHAGEAL STENT PLACEMENT (N/A) - 23 by 105 mm fully fovered as a surgical intervention.  The patient's history has been reviewed, patient examined, no change in status, stable for surgery.  I have reviewed the patient's chart and labs.  Questions were answered to the patient's satisfaction.     [name]

## 2019-04-25 NOTE — Anesthesia Procedure Notes (Signed)
Procedure Name: MAC Date/Time: 04/25/2019 7:38 AM Performed by: Niel Hummer, CRNA Pre-anesthesia Checklist: Patient identified, Emergency Drugs available, Suction available and Patient being monitored Patient Re-evaluated:Patient Re-evaluated prior to induction Oxygen Delivery Method: Simple face mask

## 2019-04-25 NOTE — Anesthesia Preprocedure Evaluation (Signed)
Anesthesia Evaluation  Patient identified by MRN, date of birth, ID band Patient awake    Reviewed: Allergy & Precautions, NPO status , Patient's Chart, lab work & pertinent test results  Airway Mallampati: II  TM Distance: >3 FB Neck ROM: Full    Dental no notable dental hx.    Pulmonary neg pulmonary ROS, former smoker,    Pulmonary exam normal breath sounds clear to auscultation       Cardiovascular hypertension, Normal cardiovascular exam Rhythm:Regular Rate:Normal     Neuro/Psych negative neurological ROS  negative psych ROS   GI/Hepatic negative GI ROS, Neg liver ROS,   Endo/Other  negative endocrine ROS  Renal/GU negative Renal ROS  negative genitourinary   Musculoskeletal negative musculoskeletal ROS (+)   Abdominal   Peds negative pediatric ROS (+)  Hematology negative hematology ROS (+) anemia ,   Anesthesia Other Findings   Reproductive/Obstetrics negative OB ROS                             Anesthesia Physical Anesthesia Plan  ASA: III  Anesthesia Plan: MAC   Post-op Pain Management:    Induction: Intravenous  PONV Risk Score and Plan: 0  Airway Management Planned: Simple Face Mask  Additional Equipment:   Intra-op Plan:   Post-operative Plan:   Informed Consent: I have reviewed the patients History and Physical, chart, labs and discussed the procedure including the risks, benefits and alternatives for the proposed anesthesia with the patient or authorized representative who has indicated his/her understanding and acceptance.     Dental advisory given  Plan Discussed with: CRNA and Surgeon  Anesthesia Plan Comments:         Anesthesia Quick Evaluation

## 2019-04-25 NOTE — Discharge Instructions (Signed)
° °  I dilated the esophagus and placed the stent.  Do step 1 diet over weekend. I plan to call you by Monday and check and we can decide about step 2.  I appreciate the opportunity to care for you. Gatha Mayer, MD, FACG   YOU HAD AN ENDOSCOPIC PROCEDURE TODAY: Refer to the procedure report and other information in the discharge instructions given to you for any specific questions about what was found during the examination. If this information does not answer your questions, please call Dr. Celesta Aver office at [phone] to clarify.   YOU SHOULD EXPECT: Some feelings of bloating in the abdomen. Passage of more gas than usual. Walking can help get rid of the air that was put into your GI tract during the procedure and reduce the bloating. If you had a lower endoscopy (such as a colonoscopy or flexible sigmoidoscopy) you may notice spotting of blood in your stool or on the toilet paper. Some abdominal soreness may be present for a day or two, also.  DIET: Step 1 stent diet until further notice  ACTIVITY: Your care partner should take you home directly after the procedure. You should plan to take it easy, moving slowly for the rest of the day. You can resume normal activity the day after the procedure however YOU SHOULD NOT DRIVE, use power tools, machinery or perform tasks that involve climbing or major physical exertion for 24 hours (because of the sedation medicines used during the test).   SYMPTOMS TO REPORT IMMEDIATELY: A gastroenterologist can be reached at any hour. Please call [phone]  for any of the following symptoms:   Following upper endoscopy (EGD, EUS, ERCP, esophageal dilation) Vomiting of blood or coffee ground material  New, significant abdominal pain  New, significant chest pain or pain under the shoulder blades  Painful or persistently difficult swallowing  New shortness of breath  Black, tarry-looking or red, bloody stools  .

## 2019-04-25 NOTE — Transfer of Care (Signed)
Immediate Anesthesia Transfer of Care Note  Patient: [name] [name]  Procedure(s) Performed: ESOPHAGOGASTRODUODENOSCOPY (EGD) WITH PROPOFOL (N/A ) BALLOON DILATION (N/A ) ESOPHAGEAL STENT PLACEMENT (N/A )  Patient Location: PACU  Anesthesia Type:MAC  Level of Consciousness: awake and alert   Airway & Oxygen Therapy: Patient Spontanous Breathing and Patient connected to face mask oxygen  Post-op Assessment: Report given to RN, Post -op Vital signs reviewed and stable and Patient moving all extremities X 4  Post vital signs: Reviewed and stable  Last Vitals:  Vitals Value Taken Time  BP    Temp    Pulse 69 04/25/19 0826  Resp 21 04/25/19 0826  SpO2 100 % 04/25/19 0826  Vitals shown include unvalidated device data.  Last Pain:  Vitals:   04/25/19 0657  TempSrc: Oral  PainSc: 0-No pain         Complications: No apparent anesthesia complications

## 2019-04-30 MED FILL — GERHARDT'S BUTT CREAM: 100000 | 30 days supply | Qty: 60 | Fill #1

## 2019-05-02 ENCOUNTER — Ambulatory Visit (INDEPENDENT_AMBULATORY_CARE_PROVIDER_SITE_OTHER)
Admission: RE | Admit: 2019-05-02 | Discharge: 2019-05-02 | Disposition: A | Discharge status: Home / Self Care | Payer: Medicare Other | Source: Ambulatory Visit | Attending: Internal Medicine | Admitting: Internal Medicine

## 2019-05-02 ENCOUNTER — Other Ambulatory Visit: Payer: Self-pay

## 2019-05-02 ENCOUNTER — Other Ambulatory Visit: Payer: Self-pay | Admitting: Internal Medicine

## 2019-05-02 ENCOUNTER — Telehealth: Payer: Self-pay | Admitting: Internal Medicine

## 2019-05-02 DIAGNOSIS — C160 Malignant neoplasm of cardia: Secondary | ICD-10-CM

## 2019-05-02 MED ORDER — OXYCODONE HCL 5 MG/5ML PO SOLN: 5.0000 mg | ORAL | 0 refills | Status: DC | PRN

## 2019-05-02 NOTE — Telephone Encounter (Signed)
It may be - I will order a CXR and refill his pain medication   Please have him come do the xray

## 2019-05-02 NOTE — Telephone Encounter (Signed)
Dr. [name] do you think the stent is causing the pain?

## 2019-05-02 NOTE — Telephone Encounter (Signed)
Patient notified He will come for xray and notified rx will be sent

## 2019-05-02 NOTE — Telephone Encounter (Signed)
Patient had a stent placed and he has back pain now- a different kind of back pain that he hasn't had until the stent was placed. He is asking to speak with someone about this to see if it has anything to do with having a stent put in. Also asking about the oxycodone medication Dr. [name] put him on if he can have a refill.

## 2019-05-02 NOTE — Progress Notes (Signed)
I called patient Stent working well His back pain is from a new T5 compression fracture I told him it could be from tumor -  He has pain meds for pain No neuro sxs described ? What is next CT or MR of spine -

## 2019-05-05 ENCOUNTER — Other Ambulatory Visit: Payer: Self-pay | Admitting: Nurse Practitioner

## 2019-05-05 ENCOUNTER — Other Ambulatory Visit: Payer: Self-pay | Admitting: Internal Medicine

## 2019-05-05 ENCOUNTER — Telehealth: Payer: Self-pay | Admitting: Nurse Practitioner

## 2019-05-05 DIAGNOSIS — C160 Malignant neoplasm of cardia: Secondary | ICD-10-CM

## 2019-05-05 MED ORDER — OXYCODONE HCL 5 MG/5ML PO SOLN: 5.0000 mg | ORAL | 0 refills | Status: DC | PRN

## 2019-05-05 NOTE — Progress Notes (Signed)
That would be great if you set that up

## 2019-05-05 NOTE — Telephone Encounter (Signed)
Scheduled appt per 11/16 sch message - unable to reach pt . Left message with appt date and time

## 2019-05-05 NOTE — Progress Notes (Signed)
Please let Mr. Gable know he will be getting an MRI scheduled by Dr. Benay Spice Also let him know I refilled oxycodone with a larger amount this time

## 2019-05-08 ENCOUNTER — Other Ambulatory Visit: Payer: Self-pay

## 2019-05-08 ENCOUNTER — Ambulatory Visit (HOSPITAL_COMMUNITY)
Admission: RE | Admit: 2019-05-08 | Discharge: 2019-05-08 | Disposition: A | Discharge status: Home / Self Care | Payer: Medicare Other | Source: Ambulatory Visit | Attending: Nurse Practitioner | Admitting: Nurse Practitioner

## 2019-05-08 ENCOUNTER — Telehealth: Payer: Self-pay | Admitting: Oncology

## 2019-05-08 DIAGNOSIS — C160 Malignant neoplasm of cardia: Secondary | ICD-10-CM | POA: Diagnosis present

## 2019-05-08 LAB — POCT I-STAT CREATININE: Creatinine, Ser: 0.7 mg/dL (ref 0.61–1.24)

## 2019-05-08 MED ORDER — GADOBUTROL 1 MMOL/ML IV SOLN
6.0000 mL | Freq: Once | INTRAVENOUS | Status: AC | PRN
  Administered 2019-05-08: 6 mL via INTRAVENOUS

## 2019-05-08 NOTE — Telephone Encounter (Signed)
Scheduled appt per 11/19 sch message - pt wife aware of appt change

## 2019-05-09 ENCOUNTER — Inpatient Hospital Stay: Payer: Medicare Other | Attending: Oncology | Admitting: Oncology

## 2019-05-09 ENCOUNTER — Other Ambulatory Visit: Payer: Self-pay

## 2019-05-09 VITALS — BP 141/82 | HR 62 | Temp 98.7°F | Resp 18 | Ht 71.0 in | Wt 152.7 lb

## 2019-05-09 DIAGNOSIS — C155 Malignant neoplasm of lower third of esophagus: Secondary | ICD-10-CM | POA: Diagnosis not present

## 2019-05-09 DIAGNOSIS — D509 Iron deficiency anemia, unspecified: Secondary | ICD-10-CM | POA: Diagnosis not present

## 2019-05-09 DIAGNOSIS — F17210 Nicotine dependence, cigarettes, uncomplicated: Secondary | ICD-10-CM | POA: Diagnosis not present

## 2019-05-09 DIAGNOSIS — Z9221 Personal history of antineoplastic chemotherapy: Secondary | ICD-10-CM | POA: Diagnosis not present

## 2019-05-09 DIAGNOSIS — C160 Malignant neoplasm of cardia: Secondary | ICD-10-CM

## 2019-05-09 DIAGNOSIS — I10 Essential (primary) hypertension: Secondary | ICD-10-CM | POA: Diagnosis not present

## 2019-05-09 DIAGNOSIS — E785 Hyperlipidemia, unspecified: Secondary | ICD-10-CM | POA: Diagnosis not present

## 2019-05-09 DIAGNOSIS — C7951 Secondary malignant neoplasm of bone: Secondary | ICD-10-CM | POA: Insufficient documentation

## 2019-05-09 NOTE — Progress Notes (Signed)
[facility] OFFICE PROGRESS NOTE   Diagnosis: Gastroesophageal cancer  INTERVAL HISTORY:   [name] underwent a repeat esophageal dilatation and placement of a new esophageal stent on 04/25/2019. He developed pain at the mid upper back beginning late last week.  He contacted Dr. [name].  A plain x-ray revealed a new compression fracture at T5.  He was referred for an MRI of the thoracic spine 05/08/2019.  This reveals extensive signal abnormality and enhancement at T1-T5 within follow-up end of the posterior elements.  There is also edema and enhancement of the left posterior T6.  An area of edema and enhancement the right aspect of L1 may also be related to metastasis.  Compression fractures are noted at T3-T5 with 20-30% loss of vertebral height at T3 and T4.  No spinal cord signal abnormality.  He reports 5 mg of oxycodone relieves the pain for 4-5 hours.  He takes oxycodone multiple times per day.  He continues to tolerate a liquid and soft diet.  He uses the feeding tube.  No pain at other sites. Objective:  Vital signs in last 24 hours:  Blood pressure (!) 141/82, pulse 62, temperature 98.7 F (37.1 C), temperature source Temporal, resp. rate 18, height '5\' 11"'  (1.803 m), weight 152 lb 11.2 oz (69.3 kg), SpO2 99 %.    HEENT: Neck without mass Lymphatics: No cervical, supraclavicular, or axillary nodes GI: No hepatomegaly, no mass, left upper quadrant feeding tube Vascular: No leg edema Neuro: The motor exam appears intact in the upper and lower extremities bilaterally Musculoskeletal: Tender at the mid thoracic spine at approximately the T3-T5 levels, no mass or rash   Lab Results:  Lab Results  Component Value Date   WBC 6.6 02/09/2019   HGB 10.8 (L) 02/09/2019   HCT 35.2 (L) 02/09/2019   MCV 91.2 02/09/2019   PLT 193 02/09/2019   NEUTROABS 5.2 02/09/2019    CMP  Lab Results  Component Value Date   NA 140 02/09/2019   K 4.0 02/09/2019   CL 107  02/09/2019   CO2 28 02/09/2019   GLUCOSE 128 (H) 02/09/2019   BUN 14 02/09/2019   CREATININE 0.70 05/08/2019   CALCIUM 8.5 (L) 02/09/2019   PROT 7.0 02/06/2019   ALBUMIN 2.7 (L) 02/09/2019   AST 21 02/06/2019   ALT 23 02/06/2019   ALKPHOS 108 02/06/2019   BILITOT 0.6 02/06/2019   GFRNONAA >60 02/11/2019   GFRAA >60 02/11/2019    No results found for: CEA1  Lab Results  Component Value Date   INR 1.1 11/22/2018    Imaging:  Mr Thoracic Spine W Wo Contrast  Result Date: 05/08/2019 CLINICAL DATA:  Adenocarcinoma of gastroesophageal junction. Esophagus cancer, T5 compression fracture. EXAM: MRI THORACIC WITHOUT AND WITH CONTRAST TECHNIQUE: Multiplanar and multiecho pulse sequences of the thoracic spine were obtained without and with intravenous contrast. CONTRAST:  55m GADAVIST GADOBUTROL 1 MMOL/ML IV SOLN COMPARISON:  Chest radiograph 05/02/2019, chest CT 02/06/2019 FINDINGS: MRI THORACIC SPINE FINDINGS Multiple sequences are motion degraded. Alignment: No significant bony retropulsion or spondylolisthesis Vertebrae: There is extensive T1 hypointense and T2/STIR hyperintense signal abnormality as well as abnormal enhancement throughout much of the T1 through T5 vertebral bodies. There is also involvement of the posterior elements at several of these levels, particularly at T2 where there is additional involvement of the bilateral posterior second ribs. Findings presumably reflect interval progression of osseous metastatic disease. Edema signal and enhancement is also present within the left posterior T6  vertebra extending into the left pedicle, measuring 2.0 x 1.4 x 2.2 cm (AP x TV x CC), and consistent with an additional site of osseous metastatic disease (series 11, image 11) (series 9, image 28). Additional focus of edema signal and enhancement within the right posterosuperior aspect of the L1 vertebra for which an additional osseous metastatic lesion cannot be excluded (series 11, image  6) (series 6, image 6). A 1.9 cm T2/STIR hyperintense, enhancing lesion within the T12 vertebral body has an appearance most suggestive of a atypical hemangioma. Attention recommended on follow-up. There are compression fractures of the T3 through T5 vertebrae. The greatest degree of vertebral body height loss is present at the T3 and T4 levels (20-30%). Cord: No spinal cord signal abnormality is identified.  No abnormal Paraspinal and other soft tissues: Incompletely assessed esophagogastric anastomosis status post stent procedure. Moderate left and small right layering pleural effusions. Disc levels: Mild thoracic spondylosis without significant spinal canal stenosis at any level. Most notably, there is a shallow left center disc protrusion at T7-T8 which mildly effaces the ventral thecal sac. No significant neural foraminal narrowing. These results will be called to the ordering clinician or representative by the Radiologist Assistant, and communication documented in the PACS or zVision Dashboard. IMPRESSION: 1. Interval progression of osseous metastatic disease now involving the T1 through T6 vertebrae. Involvement of the posterior elements at several of these levels, most notably at T2 where there is also involvement of the bilateral posterior second ribs. Additional 5 mm site of osseous metastatic disease questioned within the posterior L1 vertebral body. A 1.9 cm lesion within the T12 vertebral body has an appearance most suggestive of an atypical hemangioma, although attention is recommended on follow-up. 2. Compression deformities of the T3 through T5 vertebrae. Greatest degree of vertebral body height loss present at T3 and T4 (20-30%). 3. Mild thoracic spondylosis without significant spinal canal narrowing. 4. Moderate left small right pleural effusions Electronically Signed   By: Kellie Simmering DO   On: 05/08/2019 10:50    Medications: I have reviewed the patient's current medications.    Assessment/Plan: 1. 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 Upper endoscopy 07/25/2018-esophageal mass at 38-41 cm extending to the GE junction and gastric cardia, biopsy confirmed adenocarcinoma ? CTs 08/01/2018-wall thickening at the distal esophagus extending to the GE junction and gastric cardia, gastrohepatic ligament lymphadenopathy, perigastric lymph node, too small to characterize liver lesion, bilateral adrenal adenomas ? PET scan 08/09/2018-hypermetabolic mass spanning the gastroesophageal junction. Clustered gastrohepatic ligament lymph nodes likely involved with maximum SUV 3.2. ? Radiation 08/21/2018-09/27/2018  ? Cycle 1 weekly Taxol/carboplatin 08/21/2018 ? Cycle 2 weekly Taxol/carboplatin 08/27/2018 ? Cycle 3 weekly Taxol/carboplatin 09/03/2018 ? Cycle 4 weekly Taxol/carboplatin 09/10/2018 ? Cycle 5 weekly Taxol/carboplatin 09/17/2018 ? CTs 10/24/2018-gastric cardia portion of the gastroesophageal mass less striking than on the 08/09/2018 exam. Adjacent gastrohepatic ligament adenopathy is still present with clustered nodes measuring 1.2 cm. Aortocaval node measures 0.8 cm, formerly the same, not formally hypermetabolic. ? Esophagogastrectomy 11/25/2018-moderate to poorly differentiated adenocarcinoma measuring at least 20 cm involving the distal esophagus, GE junction, and proximal stomach. Tumor midpoint and the proximal stomach. Tumor invades the visceral peritoneum. Proximal and distal resection margins are positive, 5/13 lymph nodes positive, lymphovascular and perineural invasion present, absent treatment effect, pT4a,pN2;foundation 1-microsatellite stable, tumor mutational burden 1, BRCA2; PDL 1 combined positive score 1;HER-2/neu negative by IHC ? CT neck 02/06/2019- extraluminal gas in the mediastinum and right neck, sclerotic lesions at T2, T4, and  the left second rib consistent with bone metastases ? CT chest 02/06/2019- pneumomediastinum, proximal  esophageal stricture with ill-defined soft tissue, sclerotic lesion at T4, new?Marland Kitchen  A potential new lesion at T2 ? MRI thoracic spine 05/08/2019-osseous metastatic disease at T1-T6, and potentially L1.  Compression fractures T3-T5, moderate left and small right pleural effusions  2. Iron deficiency anemia secondary to #1,improved 3. Colon polyps, tubular adenomas, identified on colonoscopy 07/25/2018 4. Hypertension 5. Hyperlipidemia 6. Tobacco use 7. 02/06/2019 -hospital admission for pneumomediastinum after undergoing an upper endoscopy/dilatation and esophageal biopsy-ulcerated squamous mucosa with no malignancy identified, antibiotics/n.p.o.-repeat esophagram 02/10/2019-persistent narrowing in the proximal esophagus, no leak  Esophagram 02/17/2019-high-grade stricture at the esophagogastric anastomosis, no leak  Repeat upper endoscopy procedures with placement of an esophageal stent September and October 2020, stent migrated into the stomach and was removed, esophageal stricture improved   Disposition: Mr. Mcmeen has clinical and x-ray evidence of progressive metastatic gastroesophageal cancer.  He appears to have metastatic disease involving the thoracic spine.  I discussed the MRI findings and reviewed the images with Mr. Aloi and his wife.  We discussed treatment options including systemic therapy and palliative radiation.  He would like to proceed with palliative radiation for pain relief.  I discussed the case with Dr. Lisbeth Renshaw.  The upper thoracic spine lesions appear to be outside of the previous radiation field.  He will contact Mr. Caligiuri to schedule an appointment for treatment planning.  He will continue oxycodone for pain.  He will contact us if the oxycodone is not working.  He will return for an office visit toward the end of radiation.  We will plan for restaging CTs at the completion of radiation and then consider systemic treatment options.  40 minutes were spent with the patient  today.  The majority of the time was used for counseling and coordination of care.  Betsy Coder, MD  05/09/2019  11:54 AM

## 2019-05-12 ENCOUNTER — Ambulatory Visit: Payer: Medicare Other | Admitting: Nurse Practitioner

## 2019-05-12 ENCOUNTER — Telehealth: Payer: Self-pay | Admitting: Radiation Oncology

## 2019-05-12 ENCOUNTER — Telehealth: Payer: Self-pay | Admitting: Oncology

## 2019-05-12 NOTE — Telephone Encounter (Signed)
Contacted patient to verify telephone for pre reg

## 2019-05-12 NOTE — Telephone Encounter (Signed)
Scheduled per los. Called and left msg. Mailed printout  °

## 2019-05-12 NOTE — Progress Notes (Signed)
Histology and Location of Primary Cancer: gastric adenocarcinoma 1. Interval progression of osseous metastatic disease now involving the T1 through T6 vertebrae. Involvement of the posterior elements at several of these levels, most notably at T2 where there is also involvement of the bilateral posterior second ribs. Additional 5 mm site of osseous metastatic disease questioned within the posterior L1 vertebral body. A 1.9 cm lesion within the T12 vertebral body has an appearance most suggestive of an atypical hemangioma, although attention is recommended on follow-up. 2. Compression deformities of the T3 through T5 vertebrae. Greatest degree of vertebral body height loss present at T3 and T4 (20-30%). 3. Mild thoracic spondylosis without significant spinal canal narrowing. 4. Moderate left small right pleural effusions Sites of Visceral and Bony Metastatic Disease:  Location(s) of Symptomatic Metastases: Thoracic spine  ? Past/Anticipated chemotherapy by medical oncology, if any: Radiation 08/21/2018-09/27/2018 ? Cycle 1 weekly Taxol/carboplatin 08/21/2018 ? Cycle 2 weekly Taxol/carboplatin 08/27/2018 ? Cycle 3 weekly Taxol/carboplatin 09/03/2018 ? Cycle 4 weekly Taxol/carboplatin 09/10/2018 Cycle 5 weekly Taxol/carboplatin 09/17/2018  Pain on a scale of 0-10 is: 6   If Spine Met(s), symptoms, if any, include:  Bowel/Bladder retention or incontinence (please describe): no  Numbness or weakness in extremities (please describe): no  Current Decadron regimen, if applicable: no  Ambulatory status? Walker? Wheelchair?:no  SAFETY ISSUES:  Prior radiation? 08/21/2018-09/27/2018  Pacemaker/ICD? no  Possible current pregnancy? no  Is the patient on methotrexate? no  Current Complaints / other details:  Patient is having increasing upper back pain today it is a 6/10 using his medication as directed. Has a G tube at tis time. Today is his 47th wedding anniversary.

## 2019-05-13 ENCOUNTER — Ambulatory Visit
Admission: RE | Admit: 2019-05-13 | Discharge: 2019-05-13 | Disposition: A | Discharge status: Home / Self Care | Payer: Medicare Other | Source: Ambulatory Visit | Attending: Radiation Oncology | Admitting: Radiation Oncology

## 2019-05-13 ENCOUNTER — Other Ambulatory Visit: Payer: Self-pay

## 2019-05-13 ENCOUNTER — Ambulatory Visit: Payer: Medicare Other | Admitting: Cardiothoracic Surgery

## 2019-05-13 ENCOUNTER — Encounter: Payer: Self-pay | Admitting: Radiation Oncology

## 2019-05-13 DIAGNOSIS — C7951 Secondary malignant neoplasm of bone: Secondary | ICD-10-CM | POA: Insufficient documentation

## 2019-05-13 DIAGNOSIS — C160 Malignant neoplasm of cardia: Secondary | ICD-10-CM | POA: Insufficient documentation

## 2019-05-13 NOTE — Patient Instructions (Signed)
Coronavirus (COVID-19) Are you at risk?  Are you at risk for the Coronavirus (COVID-19)?  To be considered HIGH RISK for Coronavirus (COVID-19), you have to meet the following criteria:  . Traveled to [place], [place], [place], [place] or [place]; or in the [place] to [place], [place], [place], or [place]; and have fever, cough, and shortness of breath within the last 2 weeks of travel OR . Been in close contact with a person diagnosed with COVID-19 within the last 2 weeks and have fever, cough, and shortness of breath . IF YOU DO NOT MEET THESE CRITERIA, YOU ARE CONSIDERED LOW RISK FOR COVID-19.  What to do if you are HIGH RISK for COVID-19?  . If you are having a medical emergency, call 911. . Seek medical care right away. Before you go to a doctor's office, urgent care or emergency department, call ahead and tell them about your recent travel, contact with someone diagnosed with COVID-19, and your symptoms. You should receive instructions from your physician's office regarding next steps of care.  . When you arrive at healthcare provider, tell the healthcare staff immediately you have returned from visiting [place], [place], [place], [place] or [place]; or traveled in the [place] to [place], [place], [place], or [place]; in the last two weeks or you have been in close contact with a person diagnosed with COVID-19 in the last 2 weeks.   . Tell the health care staff about your symptoms: fever, cough and shortness of breath. . After you have been seen by a medical provider, you will be either: o Tested for (COVID-19) and discharged home on quarantine except to seek medical care if symptoms worsen, and asked to  - Stay home and avoid contact with others until you get your results (4-5 days)  - Avoid travel on public transportation if possible (such as bus, train, or airplane) or o Sent to the Emergency Department by EMS for evaluation, COVID-19 testing, and possible  admission depending on your condition and test results.  What to do if you are LOW RISK for COVID-19?  Reduce your risk of any infection by using the same precautions used for avoiding the common cold or flu:  . Wash your hands often with soap and warm water for at least 20 seconds.  If soap and water are not readily available, use an alcohol-based hand sanitizer with at least 60% alcohol.  . If coughing or sneezing, cover your mouth and nose by coughing or sneezing into the elbow areas of your shirt or coat, into a tissue or into your sleeve (not your hands). . Avoid shaking hands with others and consider head nods or verbal greetings only. . Avoid touching your eyes, nose, or mouth with unwashed hands.  . Avoid close contact with people who are sick. . Avoid places or events with large numbers of people in one location, like concerts or sporting events. . Carefully consider travel plans you have or are making. . If you are planning any travel outside or inside the [place], visit the CDC's Travelers' Health webpage for the latest health notices. . If you have some symptoms but not all symptoms, continue to monitor at home and seek medical attention if your symptoms worsen. . If you are having a medical emergency, call 911.   ADDITIONAL HEALTHCARE OPTIONS FOR PATIENTS  [facility] / e-Visit: [email or web address]         [facility]: [phone]  [facility]: [phone]                   [facility]: [phone]   

## 2019-05-14 DIAGNOSIS — C7951 Secondary malignant neoplasm of bone: Secondary | ICD-10-CM | POA: Insufficient documentation

## 2019-05-14 NOTE — Progress Notes (Signed)
Radiation Oncology         [id number]) [phone] ________________________________  Name: [name] [name] MRN: [id number]  Date: 05/13/2019  DOB: [birth year]  Follow-Up Visit Note  CC: Binnie Rail, MD  Ladell Pier, MD    Telemedicine This encounter was provided by telephone. The patient was not able to connect with videoconference.   The patient has given verbal consent for this type of encounter. The time spent during this encounter was 25 minutes, with the majority during personal encounter. The attendants for this meeting include Kyung Rudd  and [name].  During the encounter, Kyung Rudd was located at [facility] Radiation Oncology Department.  Ahmad Magann was located at home.    Diagnosis:   Stage IV gastroesophageal adenocarcinoma  Interval Since Last Radiation:  7 months   Narrative:  The patient returns today for routine follow-up.  The patient completed a course of chemoradiation previously to the gastroesophageal junction region.  The patient subsequently proceeded with resection which confirmed residual locally advanced disease.  Since that time, the patient has experienced esophageal dilatation, and developed a fistula.  An esophageal stent has been placed.  At this time, the patient is doing reasonably well given all of these recent events.  He is describing some upper back pain which has worsened over the last several weeks.  Imaging including an MRI scan of the thoracic spine reveals involvement of the T1-T6 levels.  I have been asked to see the patient today for consideration of palliative radiation to this area which is superior to his prior course of radiation treatment.                              ALLERGIES:  has No Known Allergies.  Meds: Current Outpatient Medications  Medication Sig Dispense Refill   Hydrocortisone ([name]'S BUTT CREAM) CREA Apply 1 application topically 2 (two) times daily as needed for irritation  (apply around g tube). (Patient taking differently: Apply 1 application topically daily. ) 1 each 1   Nutritional Supplements (FEEDING SUPPLEMENT, OSMOLITE 1.5 CAL,) LIQD Place 237 mLs into feeding tube continuous. Uses 7 cans over 24 hours     oxyCODONE (ROXICODONE) 5 MG/5ML solution Take 5 mLs (5 mg total) by mouth every 4 (four) hours as needed for moderate pain or severe pain. 473 mL 0   No current facility-administered medications for this encounter.    Facility-Administered Medications Ordered in Other Encounters  Medication Dose Route Frequency Provider Last Rate Last Dose   alum & mag hydroxide-simeth (MAALOX/MYLANTA) 200-200-20 MG/5ML suspension 30 mL  30 mL Oral Once Harle Stanford., PA-C       And   lidocaine (XYLOCAINE) 2 % viscous mouth solution 15 mL  15 mL Oral Once Harle Stanford., PA-C        Physical Findings: The patient is in no acute distress. Patient is alert and oriented.  vitals were not taken for this visit. .     Lab Findings: Lab Results  Component Value Date   WBC 6.6 02/09/2019   HGB 10.8 (L) 02/09/2019   HCT 35.2 (L) 02/09/2019   MCV 91.2 02/09/2019   PLT 193 02/09/2019     Radiographic Findings: Dg Chest 2 View  Result Date: 05/02/2019 CLINICAL DATA:  Thoracic back pain. Adenocarcinoma of gastroesophageal junction. Status post esophagectomy with esophageal stent in place. Most recent stent placement 6 days ago. EXAM: CHEST -  2 VIEW COMPARISON:  Radiograph 03/24/2019. FINDINGS: Esophageal stent is in place, proximal aspect of the thoracic inlet at the level of the clavicles. Distal aspect of the stent is approximately 4.5 cm from the diaphragmatic hiatus. Small left pleural effusion/pleural thickening with adjacent basilar scarring. Unchanged heart size and mediastinal contours. No pneumothorax. No acute airspace disease. Mild superior endplate compression deformity of upper thoracic vertebra, tentatively T5, new from prior exam. IMPRESSION: 1.  Esophageal stent in place, proximal aspect of the stent at the level of the clavicles. Distal aspect of the stent is approximately 4.5 cm from the diaphragmatic hiatus. 2. Mild upper thoracic compression fracture (tentatively T5) is new from prior exam and may be cause of patient's back pain. Electronically Signed   By: Keith Rake M.D.   On: 05/02/2019 15:36   Mr Thoracic Spine W Wo Contrast  Result Date: 05/08/2019 CLINICAL DATA:  Adenocarcinoma of gastroesophageal junction. Esophagus cancer, T5 compression fracture. EXAM: MRI THORACIC WITHOUT AND WITH CONTRAST TECHNIQUE: Multiplanar and multiecho pulse sequences of the thoracic spine were obtained without and with intravenous contrast. CONTRAST:  45mL GADAVIST GADOBUTROL 1 MMOL/ML IV SOLN COMPARISON:  Chest radiograph 05/02/2019, chest CT 02/06/2019 FINDINGS: MRI THORACIC SPINE FINDINGS Multiple sequences are motion degraded. Alignment: No significant bony retropulsion or spondylolisthesis Vertebrae: There is extensive T1 hypointense and T2/STIR hyperintense signal abnormality as well as abnormal enhancement throughout much of the T1 through T5 vertebral bodies. There is also involvement of the posterior elements at several of these levels, particularly at T2 where there is additional involvement of the bilateral posterior second ribs. Findings presumably reflect interval progression of osseous metastatic disease. Edema signal and enhancement is also present within the left posterior T6 vertebra extending into the left pedicle, measuring 2.0 x 1.4 x 2.2 cm (AP x TV x CC), and consistent with an additional site of osseous metastatic disease (series 11, image 11) (series 9, image 28). Additional focus of edema signal and enhancement within the right posterosuperior aspect of the L1 vertebra for which an additional osseous metastatic lesion cannot be excluded (series 11, image 6) (series 6, image 6). A 1.9 cm T2/STIR hyperintense, enhancing lesion within  the T12 vertebral body has an appearance most suggestive of a atypical hemangioma. Attention recommended on follow-up. There are compression fractures of the T3 through T5 vertebrae. The greatest degree of vertebral body height loss is present at the T3 and T4 levels (20-30%). Cord: No spinal cord signal abnormality is identified.  No abnormal Paraspinal and other soft tissues: Incompletely assessed esophagogastric anastomosis status post stent procedure. Moderate left and small right layering pleural effusions. Disc levels: Mild thoracic spondylosis without significant spinal canal stenosis at any level. Most notably, there is a shallow left center disc protrusion at T7-T8 which mildly effaces the ventral thecal sac. No significant neural foraminal narrowing. These results will be called to the ordering clinician or representative by the Radiologist Assistant, and communication documented in the PACS or zVision Dashboard. IMPRESSION: 1. Interval progression of osseous metastatic disease now involving the T1 through T6 vertebrae. Involvement of the posterior elements at several of these levels, most notably at T2 where there is also involvement of the bilateral posterior second ribs. Additional 5 mm site of osseous metastatic disease questioned within the posterior L1 vertebral body. A 1.9 cm lesion within the T12 vertebral body has an appearance most suggestive of an atypical hemangioma, although attention is recommended on follow-up. 2. Compression deformities of the T3 through T5 vertebrae. Greatest degree  of vertebral body height loss present at T3 and T4 (20-30%). 3. Mild thoracic spondylosis without significant spinal canal narrowing. 4. Moderate left small right pleural effusions Electronically Signed   By: Kellie Simmering DO   On: 05/08/2019 10:50   Dg Esophagus Dilation  Result Date: 04/25/2019 ESOPHAGEAL DILATATION: Fluoroscopy was provided for use by the requesting physician.  No images were obtained for  radiographic interpretation.   Impression:    The patient is appropriate for a short course of palliative radiation treatment.  He is not currently on systemic treatment.  We discussed a 2-week course of treatment to the upper thoracic spine targeting the T1-T6 levels.  We discussed the rationale of such a treatment as well as the potential side effects and risks.  All of his questions were answered and he does wish to proceed with such a treatment.  Plan: The patient will proceed with simulation in the near future for treatment planning.  I anticipate treating the patient to a dose of 30 Gray in 10 fractions.   Jodelle Gross, M.D., Ph.D.

## 2019-05-15 ENCOUNTER — Other Ambulatory Visit: Payer: Self-pay | Admitting: Oncology

## 2019-05-15 DIAGNOSIS — C160 Malignant neoplasm of cardia: Secondary | ICD-10-CM

## 2019-05-15 MED ORDER — OXYCODONE HCL 5 MG/5ML PO SOLN: 5.0000 mg | ORAL | 0 refills | Status: DC | PRN

## 2019-05-20 ENCOUNTER — Other Ambulatory Visit: Payer: Self-pay

## 2019-05-20 ENCOUNTER — Ambulatory Visit
Admission: RE | Admit: 2019-05-20 | Discharge: 2019-05-20 | Disposition: A | Discharge status: Home / Self Care | Payer: Medicare Other | Source: Ambulatory Visit | Attending: Radiation Oncology | Admitting: Radiation Oncology

## 2019-05-20 DIAGNOSIS — Z510 Encounter for antineoplastic radiation therapy: Secondary | ICD-10-CM | POA: Insufficient documentation

## 2019-05-20 DIAGNOSIS — C7951 Secondary malignant neoplasm of bone: Secondary | ICD-10-CM | POA: Insufficient documentation

## 2019-05-20 DIAGNOSIS — C160 Malignant neoplasm of cardia: Secondary | ICD-10-CM | POA: Insufficient documentation

## 2019-05-21 ENCOUNTER — Telehealth: Payer: Self-pay | Admitting: Internal Medicine

## 2019-05-21 ENCOUNTER — Ambulatory Visit (INDEPENDENT_AMBULATORY_CARE_PROVIDER_SITE_OTHER)
Admission: RE | Admit: 2019-05-21 | Discharge: 2019-05-21 | Disposition: A | Discharge status: Home / Self Care | Payer: Medicare Other | Source: Ambulatory Visit | Attending: Internal Medicine | Admitting: Internal Medicine

## 2019-05-21 DIAGNOSIS — R1310 Dysphagia, unspecified: Secondary | ICD-10-CM

## 2019-05-21 DIAGNOSIS — R1319 Other dysphagia: Secondary | ICD-10-CM

## 2019-05-21 DIAGNOSIS — C160 Malignant neoplasm of cardia: Secondary | ICD-10-CM | POA: Diagnosis not present

## 2019-05-21 DIAGNOSIS — T85528S Displacement of other gastrointestinal prosthetic devices, implants and grafts, sequela: Secondary | ICD-10-CM

## 2019-05-21 DIAGNOSIS — K222 Esophageal obstruction: Secondary | ICD-10-CM | POA: Diagnosis not present

## 2019-05-21 NOTE — Telephone Encounter (Signed)
Dr Gessner please advise? 

## 2019-05-21 NOTE — Telephone Encounter (Signed)
Please ask him his current sxs - what he can and cannot he down and if he thinks stent has moved  If he can also come for a CXR today we can see where stent is

## 2019-05-21 NOTE — Progress Notes (Signed)
  Radiation Oncology         [phone] ________________________________  Name: [name] [name] MRN: [id number]  Date: 05/20/2019  DOB: Jun 06, 1951  SIMULATION AND TREATMENT PLANNING NOTE  DIAGNOSIS:     ICD-10-CM   1. Bone metastasis ([place])  C79.51      Site:  T1-6 spine  NARRATIVE:  The patient was brought to the [facility].  Identity was confirmed.  All relevant records and images related to the planned course of therapy were reviewed.   Written consent to proceed with treatment was confirmed which was freely given after reviewing the details related to the planned course of therapy had been reviewed with the patient.  Then, the patient was set-up in a stable reproducible  supine position for radiation therapy.  CT images were obtained.  Surface markings were placed.    Medically necessary treatment device(s) for immobilization:  Wing-board device.   The CT images were loaded into the planning software.  Then the target and avoidance structures were contoured.  Treatment planning then occurred.  The radiation prescription was entered and confirmed.  A total of 2 complex treatment devices were fabricated which relate to the designed radiation treatment fields. Additional fields and-or reduced fields will be used as necessary to improve the dose heterogeneity of the plan.  Each of these customized fields/ complex treatment devices will be used on a daily basis during the radiation course. I have requested : Isodose Plan.   PLAN:  The patient will receive 30 Gy in 10 fractions.  ________________________________   Jodelle Gross, MD, PhD

## 2019-05-21 NOTE — Telephone Encounter (Signed)
Patient reports that he can swallow coffee and broth.  Unable to swallow tomato soup.  He had MRI last week that showed:  Marland Kitchen Interval progression of osseous metastatic disease now involving the T1 through T6 vertebrae. Involvement of the posterior elements at several of these levels, most notably at T2 where there is also involvement of the bilateral posterior second ribs. Additional 5 mm site of osseous metastatic disease questioned within the posterior L1 vertebral body. A 1.9 cm lesion within the T12 vertebral body has an appearance most suggestive of an atypical hemangioma, although attention is recommended on follow-up. 2. Compression deformities of the T3 through T5 vertebrae. Greatest degree of vertebral body height loss present at T3 and T4 (20-30%). 3. Mild thoracic spondylosis without significant spinal canal narrowing. 4. Moderate left small right pleural effusions    He we will start radiation tx tomorrow for the next 10 days.  He [place] most concerned because he can't lie flat because he is chocking when he does.  He will come for the CXR this pm.

## 2019-05-21 NOTE — Telephone Encounter (Signed)
CXR shows stent in place  Had pumpkin pie filling and has been gulping to swallow some things since - but has not been able to lie flat without a pressure in pharyngeal/upper esophagus area since surgery  I have pended a DG esophagus order  Would like  him to do that tomorrow  He has XRT at 250 PM

## 2019-05-22 ENCOUNTER — Other Ambulatory Visit: Payer: Self-pay

## 2019-05-22 ENCOUNTER — Telehealth: Payer: Self-pay | Admitting: Internal Medicine

## 2019-05-22 ENCOUNTER — Other Ambulatory Visit: Payer: Self-pay | Admitting: Radiation Oncology

## 2019-05-22 ENCOUNTER — Ambulatory Visit
Admission: RE | Admit: 2019-05-22 | Discharge: 2019-05-22 | Disposition: A | Discharge status: Home / Self Care | Payer: Medicare Other | Source: Ambulatory Visit | Attending: Radiation Oncology | Admitting: Radiation Oncology

## 2019-05-22 ENCOUNTER — Other Ambulatory Visit (HOSPITAL_COMMUNITY)
Admission: RE | Admit: 2019-05-22 | Discharge: 2019-05-22 | Disposition: A | Discharge status: Home / Self Care | Payer: Medicare Other | Source: Ambulatory Visit | Attending: Internal Medicine | Admitting: Internal Medicine

## 2019-05-22 ENCOUNTER — Ambulatory Visit (HOSPITAL_COMMUNITY)
Admission: RE | Admit: 2019-05-22 | Discharge: 2019-05-22 | Disposition: A | Discharge status: Home / Self Care | Payer: Medicare Other | Source: Ambulatory Visit | Attending: Internal Medicine | Admitting: Internal Medicine

## 2019-05-22 DIAGNOSIS — Z20828 Contact with and (suspected) exposure to other viral communicable diseases: Secondary | ICD-10-CM | POA: Diagnosis not present

## 2019-05-22 DIAGNOSIS — T85528S Displacement of other gastrointestinal prosthetic devices, implants and grafts, sequela: Secondary | ICD-10-CM | POA: Diagnosis present

## 2019-05-22 DIAGNOSIS — C160 Malignant neoplasm of cardia: Secondary | ICD-10-CM

## 2019-05-22 DIAGNOSIS — Z510 Encounter for antineoplastic radiation therapy: Secondary | ICD-10-CM | POA: Diagnosis not present

## 2019-05-22 DIAGNOSIS — R1319 Other dysphagia: Secondary | ICD-10-CM

## 2019-05-22 DIAGNOSIS — R1310 Dysphagia, unspecified: Secondary | ICD-10-CM | POA: Insufficient documentation

## 2019-05-22 DIAGNOSIS — X58XXXS Exposure to other specified factors, sequela: Secondary | ICD-10-CM | POA: Diagnosis not present

## 2019-05-22 DIAGNOSIS — K222 Esophageal obstruction: Secondary | ICD-10-CM | POA: Insufficient documentation

## 2019-05-22 LAB — SARS CORONAVIRUS 2 (TAT 6-24 HRS): SARS Coronavirus 2: NEGATIVE

## 2019-05-22 MED ORDER — IOHEXOL 300 MG/ML  SOLN
50.0000 mL | Freq: Once | INTRAMUSCULAR | Status: AC | PRN
  Administered 2019-05-22: 11:00:00 20 mL via ORAL

## 2019-05-22 MED ORDER — OXYCODONE HCL 5 MG/5ML PO SOLN: 5.0000 mg | ORAL | 0 refills | Status: DC | PRN

## 2019-05-22 MED ORDER — IOHEXOL 300 MG/ML  SOLN
50.0000 mL | Freq: Once | INTRAMUSCULAR | Status: AC | PRN
  Administered 2019-05-22: 11:00:00 50 mL via ORAL

## 2019-05-22 MED FILL — GERHARDT'S BUTT CREAM: 100000 | 30 days supply | Qty: 60 | Fill #1

## 2019-05-22 NOTE — Telephone Encounter (Signed)
Patient notified  He will go to [facility] this am and arrive at 10:15.  He is advised to be NPO until he has BS

## 2019-05-22 NOTE — Telephone Encounter (Signed)
Esophageal stent has migrated slightly  Needs to be pulled up  We need to do the following   1) Schedule a case for 1230 tomorrow at [facility]  - spoke to [employer] - she can do that with MAC  2) Pateint needs Covid test today he has XRT at 250 so if we call him on cell hopefully he can do that before then and not after  Place in comments of the test - having procedure tomorrow  He should tell the staff at testing site he has a procedure tomorrow and they should move him to front of line   3) Case is EGD and stent reposition - I do need fluoro so request fluoro also - either we or endo needs to order that   [facility]

## 2019-05-22 NOTE — Telephone Encounter (Signed)
Patient has been scheduled .  I have left a message with him and his wife to please call back to discuss COVID screen this pm and EGD tomorrow.

## 2019-05-22 NOTE — Telephone Encounter (Signed)
Patient wife would like to know if patient can have his oxycodone tomorrow before his procedure.

## 2019-05-23 ENCOUNTER — Ambulatory Visit
Admission: RE | Admit: 2019-05-23 | Discharge: 2019-05-23 | Disposition: A | Discharge status: Home / Self Care | Payer: Medicare Other | Source: Ambulatory Visit | Attending: Radiation Oncology | Admitting: Radiation Oncology

## 2019-05-23 ENCOUNTER — Other Ambulatory Visit: Payer: Self-pay | Admitting: Radiation Therapy

## 2019-05-23 ENCOUNTER — Other Ambulatory Visit: Payer: Self-pay | Admitting: Radiation Oncology

## 2019-05-23 ENCOUNTER — Encounter (HOSPITAL_COMMUNITY)
Admission: RE | Disposition: A | Discharge status: Home / Self Care | Payer: Self-pay | Source: Home / Self Care | Attending: Internal Medicine

## 2019-05-23 ENCOUNTER — Ambulatory Visit (HOSPITAL_COMMUNITY): Payer: Medicare Other | Admitting: Certified Registered Nurse Anesthetist

## 2019-05-23 ENCOUNTER — Ambulatory Visit (HOSPITAL_COMMUNITY): Payer: Medicare Other

## 2019-05-23 ENCOUNTER — Ambulatory Visit (HOSPITAL_COMMUNITY)
Admission: RE | Admit: 2019-05-23 | Discharge: 2019-05-23 | Disposition: A | Discharge status: Home / Self Care | Payer: Medicare Other | Attending: Internal Medicine | Admitting: Internal Medicine

## 2019-05-23 ENCOUNTER — Encounter (HOSPITAL_COMMUNITY): Payer: Self-pay | Admitting: *Deleted

## 2019-05-23 DIAGNOSIS — C7951 Secondary malignant neoplasm of bone: Secondary | ICD-10-CM | POA: Insufficient documentation

## 2019-05-23 DIAGNOSIS — K222 Esophageal obstruction: Secondary | ICD-10-CM

## 2019-05-23 DIAGNOSIS — T85528A Displacement of other gastrointestinal prosthetic devices, implants and grafts, initial encounter: Secondary | ICD-10-CM

## 2019-05-23 DIAGNOSIS — Z87891 Personal history of nicotine dependence: Secondary | ICD-10-CM | POA: Diagnosis not present

## 2019-05-23 DIAGNOSIS — R1310 Dysphagia, unspecified: Secondary | ICD-10-CM | POA: Diagnosis not present

## 2019-05-23 DIAGNOSIS — T85528S Displacement of other gastrointestinal prosthetic devices, implants and grafts, sequela: Secondary | ICD-10-CM

## 2019-05-23 DIAGNOSIS — I10 Essential (primary) hypertension: Secondary | ICD-10-CM | POA: Diagnosis not present

## 2019-05-23 DIAGNOSIS — C160 Malignant neoplasm of cardia: Secondary | ICD-10-CM

## 2019-05-23 DIAGNOSIS — Y831 Surgical operation with implant of artificial internal device as the cause of abnormal reaction of the patient, or of later complication, without mention of misadventure at the time of the procedure: Secondary | ICD-10-CM | POA: Insufficient documentation

## 2019-05-23 DIAGNOSIS — Z510 Encounter for antineoplastic radiation therapy: Secondary | ICD-10-CM | POA: Diagnosis not present

## 2019-05-23 HISTORY — PX: BALLOON DILATION: SHX5330

## 2019-05-23 HISTORY — PX: ESOPHAGOGASTRODUODENOSCOPY (EGD) WITH PROPOFOL: SHX5813

## 2019-05-23 SURGERY — ESOPHAGOGASTRODUODENOSCOPY (EGD) WITH PROPOFOL
Anesthesia: Monitor Anesthesia Care

## 2019-05-23 MED ORDER — PROPOFOL 10 MG/ML IV BOLUS
INTRAVENOUS | Status: AC
  Filled 2019-05-23: qty 20

## 2019-05-23 MED ORDER — FENTANYL CITRATE (PF) 100 MCG/2ML IJ SOLN: 25.0000 ug | INTRAMUSCULAR | Status: DC | PRN

## 2019-05-23 MED ORDER — SODIUM CHLORIDE 0.9 % IV SOLN: INTRAVENOUS | Status: DC

## 2019-05-23 MED ORDER — PROPOFOL 500 MG/50ML IV EMUL
INTRAVENOUS | Status: DC | PRN
  Administered 2019-05-23: 135 ug/kg/min via INTRAVENOUS

## 2019-05-23 MED ORDER — LACTATED RINGERS IV SOLN
INTRAVENOUS | Status: DC
  Administered 2019-05-23: 1000 mL via INTRAVENOUS
  Administered 2019-05-23: 14:00:00 via INTRAVENOUS

## 2019-05-23 MED ORDER — PROPOFOL 10 MG/ML IV BOLUS
INTRAVENOUS | Status: DC | PRN
  Administered 2019-05-23: 20 mg via INTRAVENOUS
  Administered 2019-05-23: 10 mg via INTRAVENOUS
  Administered 2019-05-23: 30 mg via INTRAVENOUS
  Administered 2019-05-23 (×2): 20 mg via INTRAVENOUS
  Administered 2019-05-23: 10 mg via INTRAVENOUS

## 2019-05-23 MED ORDER — ONDANSETRON HCL 4 MG/2ML IJ SOLN: 4.0000 mg | Freq: Once | INTRAMUSCULAR | Status: DC | PRN

## 2019-05-23 MED ORDER — LIDOCAINE 2% (20 MG/ML) 5 ML SYRINGE
INTRAMUSCULAR | Status: DC | PRN
  Administered 2019-05-23: 80 mg via INTRAVENOUS

## 2019-05-23 MED ORDER — OXYCODONE HCL 5 MG/5ML PO SOLN
5.0000 mg | Freq: Once | ORAL | Status: AC
  Administered 2019-05-23: 5 mg via ORAL
  Filled 2019-05-23: qty 5

## 2019-05-23 MED ORDER — ONDANSETRON HCL 4 MG/2ML IJ SOLN
INTRAMUSCULAR | Status: DC | PRN
  Administered 2019-05-23: 4 mg via INTRAVENOUS

## 2019-05-23 SURGICAL SUPPLY — 14 items

## 2019-05-23 NOTE — Op Note (Signed)
[facility] Patient Name: [name] [name] Procedure Date: 05/23/2019 MRN: [id number] Attending MD: Gatha Mayer , MD Date of Birth: 05/20/1951 CSN: [id number] Age: 68 Admit Type: Outpatient Procedure:                Upper GI endoscopy Indications:              Dysphagia Providers:                Gatha Mayer, MD, Debi Mays, RN, Cleda Daub,                            RN Referring MD:             Lilia Argue. Juliane Lack Michaelene Song Medicines:                Propofol per Anesthesia, Monitored Anesthesia Care Complications:            No immediate complications. Estimated Blood Loss:     Estimated blood loss was minimal. Procedure:                Pre-Anesthesia Assessment:                           - Prior to the procedure, a History and Physical                            was performed, and patient medications and                            allergies were reviewed. The patient's tolerance of                            previous anesthesia was also reviewed. The risks                            and benefits of the procedure and the sedation                            options and risks were discussed with the patient.                            All questions were answered, and informed consent                            was obtained. Prior Anticoagulants: The patient has                            taken no previous anticoagulant or antiplatelet                            agents. ASA Grade Assessment: III - A patient with                            severe systemic disease. After reviewing the risks  and benefits, the patient was deemed in                            satisfactory condition to undergo the procedure.                           After obtaining informed consent, the endoscope was                            passed under direct vision. Throughout the                            procedure, the patient's blood pressure,  pulse, and                            oxygen saturations were monitored continuously. The                            [id number]-H190 [id number]) Olympus gastroscope was                            introduced through the mouth, and advanced to the                            antrum of the stomach. The upper GI endoscopy was                            performed with difficulty due to post-surgical                            anatomy. The patient tolerated the procedure well. Scope In: Scope Out: Findings:      One benign-appearing, intrinsic severe stenosis was found in the       proximal esophagus. This stenosis measured 6 mm (inner diameter) x 1 cm       (in length). The stenosis was traversed after dilation. A TTS dilator       was passed through the scope. Dilation with an 01-25-09 mm balloon, a       03-30-11 mm balloon and a 12-13.5-15 mm balloon dilator was performed to       13.5 mm under fluoroscopic guidance. Scope passed after 12 mm dilation.       13. 5 mm dilation performed subsequently.      A metal stent was found in the gastric body. I attempted to pull it back       into position using grasper, hot bx forceps and snare but was not       successful. The drawstring tore and came off the stent.I advanced the       stent distally but could not manipulated it to snare the distal end       either.      The exam was otherwise without abnormality. Impression:               - Benign-appearing esophageal stenosis. Dilated.  13.5 mm                           - Pre-existing esophageal stent. Migrated into the                            stomach. unable to reposition, retrieve.                           - The examination was otherwise normal.                           - No specimens collected. Moderate Sedation:      Not Applicable - Patient had care per Anesthesia. Recommendation:           - Patient has a contact number available for                             emergencies. The signs and symptoms of potential                            delayed complications were discussed with the                            patient. Return to normal activities tomorrow.                            Written discharge instructions were provided to the                            patient.                           - Full liquid diet.                           - Continue present medications.                           - Repeat upper endoscopy in 1-2 weeks to attempt                            stent removal and replacement. Procedure Code(s):        --- Professional ---                           [id number], Esophagoscopy, flexible, transoral; with                            transendoscopic balloon dilation (less than 30 mm                            diameter) Diagnosis Code(s):        --- Professional ---                           K22.2, Esophageal obstruction  Z97.8, Presence of other specified devices                           R13.10, Dysphagia, unspecified CPT copyright 2019 American Medical Association. All rights reserved. The codes documented in this report are preliminary and upon coder review may  be revised to meet current compliance requirements. Gatha Mayer, MD 05/23/2019 6:01:12 PM This report has been signed electronically. Number of Addenda: 0

## 2019-05-23 NOTE — Anesthesia Postprocedure Evaluation (Signed)
Anesthesia Post Note  Patient: [name] [name]  Procedure(s) Performed: ESOPHAGOGASTRODUODENOSCOPY (EGD) WITH PROPOFOL (N/A ) BALLOON DILATION (N/A )     Patient location during evaluation: Endoscopy Anesthesia Type: MAC Level of consciousness: awake and alert Pain management: pain level controlled Vital Signs Assessment: post-procedure vital signs reviewed and stable Respiratory status: spontaneous breathing, nonlabored ventilation, respiratory function stable and patient connected to nasal cannula oxygen Cardiovascular status: stable and blood pressure returned to baseline Postop Assessment: no apparent nausea or vomiting Anesthetic complications: no    Last Vitals:  Vitals:   05/23/19 1440 05/23/19 1450  BP: (!) 156/76 (!) 152/72  Pulse: (!) 51 60  Resp: 15 19  Temp:    SpO2: 100% 100%    Last Pain:  Vitals:   05/23/19 1407  TempSrc: Temporal  PainSc: 0-No pain                 Catalina Gravel

## 2019-05-23 NOTE — Discharge Instructions (Signed)
I could not get the stent repositioned. I did dilate the stricture.  I will come up with a plan and let you know.  I have another idea but could not make that happen today.  Stay on a liquid diet.  [name] Mayer, MD, FACG  YOU HAD AN ENDOSCOPIC PROCEDURE TODAY: Refer to the procedure report and other information in the discharge instructions given to you for any specific questions about what was found during the examination. If this information does not answer your questions, please call Dr. Celesta Aver office at [phone] to clarify.   YOU SHOULD EXPECT: Some feelings of bloating in the abdomen. Passage of more gas than usual. Walking can help get rid of the air that was put into your GI tract during the procedure and reduce the bloating. If you had a lower endoscopy (such as a colonoscopy or flexible sigmoidoscopy) you may notice spotting of blood in your stool or on the toilet paper. Some abdominal soreness may be present for a day or two, also.  DIET:  Clear liquids today - full liquids tomorrow.  ACTIVITY: Your care partner should take you home directly after the procedure. You should plan to take it easy, moving slowly for the rest of the day. You can resume normal activity the day after the procedure however YOU SHOULD NOT DRIVE, use power tools, machinery or perform tasks that involve climbing or major physical exertion for 24 hours (because of the sedation medicines used during the test).   SYMPTOMS TO REPORT IMMEDIATELY: A gastroenterologist can be reached at any hour. Please call [phone]  for any of the following symptoms:   Following upper endoscopy (EGD, EUS, ERCP, esophageal dilation) Vomiting of blood or coffee ground material  New, significant abdominal pain  New, significant chest pain or pain under the shoulder blades  Painful or persistently difficult swallowing  New shortness of breath  Black, tarry-looking or red, bloody stools

## 2019-05-23 NOTE — H&P (Signed)
[facility] History and Physical   Primary Care Physician:  Binnie Rail, MD   Reason for Procedure:   adjust stent  Plan:    Reposition esophageal stent, unclog using EGD     HPI: [name] [name] is a 68 y.o. 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 Stent may have migrated some or could be clogged based upon inability to tolerate soft foods.   Past Medical History:  Diagnosis Date  . 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 Anemia   . Diverticulosis   . Family history of cancer   . Hyperlipidemia   . Hypertension   . Pre-diabetes    but patient's wife states he has never been told that  . Tobacco abuse     Past Surgical History:  Procedure Laterality Date  . BALLOON DILATION N/A 02/06/2019   Procedure: BALLOON DILATION;  Surgeon: Gatha Mayer, MD;  Location: [facility];  Service: Endoscopy;  Laterality: N/A;  . BALLOON DILATION N/A 03/11/2019   Procedure: BALLOON DILATION;  Surgeon: Gatha Mayer, MD;  Location: [facility];  Service: Endoscopy;  Laterality: N/A;  . BALLOON DILATION N/A 04/25/2019   Procedure: BALLOON DILATION;  Surgeon: Gatha Mayer, MD;  Location: [facility];  Service: Endoscopy;  Laterality: N/A;  need fluoro  . BIOPSY  02/06/2019   Procedure: BIOPSY;  Surgeon: Gatha Mayer, MD;  Location: [facility];  Service: Endoscopy;;  . COLONOSCOPY    . COMPLETE ESOPHAGECTOMY N/A 11/25/2018   Procedure: TRANSHIATAL TOTAL ESOPHAGECTOMY COMPLETE WITH CERVICAL ESOPHAGOGASTROSTOMY;  Surgeon: [name], MD;  Location: [facility];  Service: Thoracic;  Laterality: N/A;  . ESOPHAGEAL STENT PLACEMENT N/A 03/11/2019   Procedure: ESOPHAGEAL STENT PLACEMENT;  Surgeon: Gatha Mayer, MD;  Location: [facility];  Service: Endoscopy;  Laterality: N/A;  . ESOPHAGEAL STENT PLACEMENT N/A 04/25/2019   Procedure: ESOPHAGEAL STENT PLACEMENT;  Surgeon: Gatha Mayer, MD;   Location: [facility];  Service: Endoscopy;  Laterality: N/A;  23 by 105 mm fully fovered  . ESOPHAGOGASTRODUODENOSCOPY (EGD) WITH PROPOFOL N/A 02/06/2019   Procedure: ESOPHAGOGASTRODUODENOSCOPY (EGD) WITH PROPOFOL;  Surgeon: Gatha Mayer, MD;  Location: [facility];  Service: Endoscopy;  Laterality: N/A;  needs fluoro  . ESOPHAGOGASTRODUODENOSCOPY (EGD) WITH PROPOFOL N/A 03/11/2019   Procedure: ESOPHAGOGASTRODUODENOSCOPY (EGD) WITH PROPOFOL;  Surgeon: Gatha Mayer, MD;  Location: [facility];  Service: Endoscopy;  Laterality: N/A;  need fluoro  . ESOPHAGOGASTRODUODENOSCOPY (EGD) WITH PROPOFOL N/A 03/21/2019   Procedure: ESOPHAGOGASTRODUODENOSCOPY (EGD) WITH PROPOFOL;  Surgeon: Gatha Mayer, MD;  Location: [facility];  Service: Endoscopy;  Laterality: N/A;  . ESOPHAGOGASTRODUODENOSCOPY (EGD) WITH PROPOFOL N/A 03/31/2019   Procedure: ESOPHAGOGASTRODUODENOSCOPY (EGD) WITH PROPOFOL;  Surgeon: Gatha Mayer, MD;  Location: [facility];  Service: Endoscopy;  Laterality: N/A;  . ESOPHAGOGASTRODUODENOSCOPY (EGD) WITH PROPOFOL N/A 04/25/2019   Procedure: ESOPHAGOGASTRODUODENOSCOPY (EGD) WITH PROPOFOL;  Surgeon: Gatha Mayer, MD;  Location: [facility];  Service: Endoscopy;  Laterality: N/A;  . FOREIGN BODY REMOVAL  03/21/2019   Procedure: FOREIGN BODY REMOVAL;  Surgeon: Gatha Mayer, MD;  Location: [facility];  Service: Endoscopy;;  stent in stomach  . IR REPLC DUODEN/JEJUNO TUBE PERCUT W/FLUORO  12/20/2018  . JEJUNOSTOMY N/A 11/25/2018   Procedure: FEEDING JEJUNOSTOMY;  Surgeon: [name], MD;  Location: [facility] OR;  Service: Thoracic;  Laterality: N/A;  . NO PAST SURGERIES    . PARASTERNAL EXPLORATION Left 12/02/2018   Procedure: LEFT  NECK EXPLORATION;  Surgeon: [name], MD;  Location: [facility];  Service: Thoracic;  Laterality: Left;  . PYLOROPLASTY  11/25/2018   Procedure: Pyloroplasty;  Surgeon: [name], MD;  Location: [place];  Service: Thoracic;;  . Azzie Almas  DILATION N/A 03/11/2019   Procedure: Azzie Almas DILATION;  Surgeon: Gatha Mayer, MD;  Location: [facility];  Service: Endoscopy;  Laterality: N/A;  . SAVORY DILATION N/A 03/21/2019   Procedure: SAVORY DILATION;  Surgeon: Gatha Mayer, MD;  Location: [facility];  Service: Endoscopy;  Laterality: N/A;  . STENT REMOVAL  03/31/2019   Procedure: STENT REMOVAL;  Surgeon: Gatha Mayer, MD;  Location: [facility];  Service: Endoscopy;;  . VIDEO BRONCHOSCOPY N/A 11/25/2018   Procedure: VIDEO BRONCHOSCOPY;  Surgeon: [name], MD;  Location: [place];  Service: Thoracic;  Laterality: N/A;  . WISDOM TOOTH EXTRACTION      Prior to Admission medications   Medication Sig Start Date End Date Taking? Authorizing Provider  Hydrocortisone ([name]'S BUTT CREAM) CREA Apply 1 application topically 2 (two) times daily as needed for irritation (apply around g tube). Patient taking differently: Apply 1 application topically daily.  03/27/19  Yes [name], MD  Nutritional Supplements (FEEDING SUPPLEMENT, OSMOLITE 1.5 CAL,) LIQD Place 237 mLs into feeding tube continuous. Uses 7 cans over 24 hours   Yes [provider]  oxyCODONE (ROXICODONE) 5 MG/5ML solution Take 5-10 mLs (5-10 mg total) by mouth every 4 (four) hours as needed for moderate pain or severe pain. 05/22/19  Yes Hayden Pedro, PA-C    Current Facility-Administered Medications  Medication Dose Route Frequency Provider Last Rate Last Dose  . 0.9 %  sodium chloride infusion   Intravenous Continuous Gatha Mayer, MD      . fentaNYL (SUBLIMAZE) injection 25-50 mcg  25-50 mcg Intravenous Q5 min PRN Nunzio Cobbs M, DO      . lactated ringers infusion   Intravenous Continuous Gatha Mayer, MD 20 mL/hr at 05/23/19 1137 1,000 mL at 05/23/19 1137   Facility-Administered Medications Ordered in Other Encounters  Medication Dose Route Frequency Provider Last Rate Last Dose  . alum & mag hydroxide-simeth  (MAALOX/MYLANTA) 200-200-20 MG/5ML suspension 30 mL  30 mL Oral Once Harle Stanford., PA-C       And  . lidocaine (XYLOCAINE) 2 % viscous mouth solution 15 mL  15 mL Oral Once Harle Stanford., PA-C        Allergies as of 05/22/2019  . (No Known Allergies)    Family History  Problem Relation Age of Onset  . Cancer Mother        bone cancer  . Heart attack Father 59  . Cancer Sister 66       unk type  . Heart attack Paternal Uncle        X2, both < 55  . COPD Neg Hx   . Diabetes Neg Hx   . Stroke Neg Hx   . Colon cancer Neg Hx     Social History   Socioeconomic History  . Marital status: Married    Spouse name: Adele  . Number of children: 0  . Years of education: Not on file  . Highest education level: Not on file  Occupational History  . Occupation: retired  Scientific laboratory technician  . Financial resource strain: Not on file  . Food insecurity    Worry: Not on file    Inability: Not on file  . Transportation  needs    Medical: Not on file    Non-medical: Not on file  Tobacco Use  . Smoking status: Former Smoker    Packs/day: 0.25    Years: 50.00    Pack years: 12.50    Types: Cigarettes    Quit date: 06/19/2018    Years since quitting: 0.9  . Smokeless tobacco: Never Used  . Tobacco comment: 06/02/14 2-3 pp week  Substance and Sexual Activity  . Alcohol use: Yes    Alcohol/week: 2.0 standard drinks    Types: 2 Glasses of wine per week    Comment:  occasionally- None for last 12 months  . Drug use: No  . Sexual activity: Not Currently  Lifestyle  . Physical activity    Days per week: Not on file    Minutes per session: Not on file  . Stress: Not on file  Relationships  . Social Herbalist on phone: Not on file    Gets together: Not on file    Attends religious service: Not on file    Active member of club or organization: Not on file    Attends meetings of clubs or organizations: Not on file    Relationship status: Not on file  . Intimate partner  violence    Fear of current or ex partner: Not on file    Emotionally abused: Not on file    Physically abused: Not on file    Forced sexual activity: Not on file  Other Topics Concern  . Not on file  Social History Narrative   Retired   Psychologist, counselling for exercise.   Working for [employer] [phone]). Enjoys volunteering.    Former smoker, rare EtOH    Review of Systems: Positive for back and rib pain and orthopnea All other review of systems negative except as mentioned in the HPI.  Physical Exam: Vital signs in last 24 hours: Temp:  [98.5 F (36.9 C)] 98.5 F (36.9 C) (12/04 1131) Pulse Rate:  [83] 83 (12/04 1131) Resp:  [22] 22 (12/04 1131) BP: (164)/(79) 164/79 (12/04 1131) SpO2:  [99 %] 99 % (12/04 1131) Weight:  [70 kg] 70 kg (12/04 1131)   General:   Alert,   pleasant and cooperative in NAD Lungs:  Clear throughout to auscultation.   Heart:  Regular rate and rhythm; no murmurs, clicks, rubs,  or gallops. Abdomen:  Soft, nontender and nondistended. Normal bowel sounds.   Neuro/Psych:  Alert and cooperative. Normal mood and affect. A and O x 3   @Ceci Taliaferro  Simonne Maffucci, MD, [facility] [phone] (pager) 05/23/2019 12:43 PM@

## 2019-05-23 NOTE — Telephone Encounter (Signed)
Wife notified that okay to take hydrocodone

## 2019-05-23 NOTE — Transfer of Care (Signed)
Immediate Anesthesia Transfer of Care Note  Patient: [name] [name]  Procedure(s) Performed: ESOPHAGOGASTRODUODENOSCOPY (EGD) WITH PROPOFOL (N/A ) ESOPHAGEAL STENT PLACEMENT (N/A ) BALLOON DILATION (N/A )  Patient Location: PACU and Endoscopy Unit  Anesthesia Type:MAC  Level of Consciousness: awake, alert  and oriented  Airway & Oxygen Therapy: Patient Spontanous Breathing and Patient connected to face mask oxygen  Post-op Assessment: Report given to RN and Post -op Vital signs reviewed and stable  Post vital signs: Reviewed and stable  Last Vitals:  Vitals Value Taken Time  BP 137/76 05/23/19 1410  Temp    Pulse 66 05/23/19 1407  Resp 17 05/23/19 1410  SpO2 100 % 05/23/19 1407  Vitals shown include unvalidated device data.  Last Pain:  Vitals:   05/23/19 1131  TempSrc: Oral         Complications: No apparent anesthesia complications

## 2019-05-23 NOTE — Brief Op Note (Signed)
05/23/2019  2:00 PM  PATIENT:  [name]  68 y.o. male  PRE-OPERATIVE DIAGNOSIS:  esophageal stent migration  POST-OPERATIVE DIAGNOSIS:  Same, esophageal stricture  PROCEDURE:  Procedure(s) with comments: ESOPHAGOGASTRODUODENOSCOPY (EGD) WITH PROPOFOL (N/A) - fluoro ESOPHAGEAL STENT PLACEMENT (N/A) BALLOON DILATION (N/A)  SURGEON:  Surgeon(s) and Role:    * Gatha Mayer, MD - Primary   ANESTHESIA:  MAC  EBL:  minimal   tight prox esoph stricture - fluoro showed stent below  Balloon dilated to 13.5 mm from 8 mm using fluoro scope passed after dilation to 71m  I could not get the stent back into position and the string on the stent broke, could not get snare around it either   Will see if we can place an Axios stent in next week or so  Liquid diet

## 2019-05-23 NOTE — Anesthesia Preprocedure Evaluation (Addendum)
Anesthesia Evaluation  Patient identified by MRN, date of birth, ID band Patient awake    Reviewed: Allergy & Precautions, NPO status , Patient's Chart, lab work & pertinent test results  Airway Mallampati: III  TM Distance: >3 FB Neck ROM: Full    Dental  (+) Teeth Intact, Dental Advisory Given, Caps   Pulmonary former smoker,    Pulmonary exam normal breath sounds clear to auscultation       Cardiovascular hypertension, Normal cardiovascular exam Rhythm:Regular Rate:Normal     Neuro/Psych negative neurological ROS  negative psych ROS   GI/Hepatic Neg liver ROS, Adenocarcinoma of gastroesophageal junction Esophageal stent migration   Endo/Other  negative endocrine ROS  Renal/GU negative Renal ROS     Musculoskeletal negative musculoskeletal ROS (+)   Abdominal   Peds  Hematology negative hematology ROS (+)   Anesthesia Other Findings Day of surgery medications reviewed with the patient.  Reproductive/Obstetrics                            Anesthesia Physical Anesthesia Plan  ASA: III  Anesthesia Plan: MAC   Post-op Pain Management:    Induction: Intravenous  PONV Risk Score and Plan: 1 and Propofol infusion and Treatment may vary due to age or medical condition  Airway Management Planned: Nasal Cannula and Natural Airway  Additional Equipment:   Intra-op Plan:   Post-operative Plan:   Informed Consent: I have reviewed the patients History and Physical, chart, labs and discussed the procedure including the risks, benefits and alternatives for the proposed anesthesia with the patient or authorized representative who has indicated his/her understanding and acceptance.     Dental advisory given  Plan Discussed with: CRNA and Anesthesiologist  Anesthesia Plan Comments:         Anesthesia Quick Evaluation

## 2019-05-23 NOTE — Anesthesia Procedure Notes (Signed)
Procedure Name: MAC Date/Time: 05/23/2019 12:56 PM Performed by: Maxwell Caul, CRNA Pre-anesthesia Checklist: Patient identified, Emergency Drugs available, Suction available and Patient being monitored Oxygen Delivery Method: Simple face mask

## 2019-05-26 ENCOUNTER — Encounter (HOSPITAL_COMMUNITY): Payer: Self-pay | Admitting: Internal Medicine

## 2019-05-26 ENCOUNTER — Other Ambulatory Visit: Payer: Self-pay

## 2019-05-26 ENCOUNTER — Telehealth: Payer: Self-pay | Admitting: Radiation Oncology

## 2019-05-26 ENCOUNTER — Ambulatory Visit
Admission: RE | Admit: 2019-05-26 | Discharge: 2019-05-26 | Disposition: A | Discharge status: Home / Self Care | Payer: Medicare Other | Source: Ambulatory Visit | Attending: Radiation Oncology | Admitting: Radiation Oncology

## 2019-05-26 DIAGNOSIS — Z510 Encounter for antineoplastic radiation therapy: Secondary | ICD-10-CM | POA: Diagnosis not present

## 2019-05-26 NOTE — Telephone Encounter (Signed)
Phoned patient to inquire if he received pain medication escribed by Shona Simpson, PA-C on 05/22/2019. Patient confirms he has and expressed gratitude. Informed Shona Simpson, PA-C of these findings.

## 2019-05-27 ENCOUNTER — Telehealth: Payer: Self-pay | Admitting: Radiation Oncology

## 2019-05-27 ENCOUNTER — Other Ambulatory Visit: Payer: Self-pay

## 2019-05-27 ENCOUNTER — Ambulatory Visit
Admission: RE | Admit: 2019-05-27 | Discharge: 2019-05-27 | Disposition: A | Discharge status: Home / Self Care | Payer: Medicare Other | Source: Ambulatory Visit | Attending: Radiation Oncology | Admitting: Radiation Oncology

## 2019-05-27 DIAGNOSIS — Z510 Encounter for antineoplastic radiation therapy: Secondary | ICD-10-CM | POA: Diagnosis not present

## 2019-05-28 ENCOUNTER — Ambulatory Visit
Admission: RE | Admit: 2019-05-28 | Discharge: 2019-05-28 | Disposition: A | Discharge status: Home / Self Care | Payer: Medicare Other | Source: Ambulatory Visit | Attending: Radiation Oncology | Admitting: Radiation Oncology

## 2019-05-28 ENCOUNTER — Other Ambulatory Visit: Payer: Self-pay | Admitting: Internal Medicine

## 2019-05-28 ENCOUNTER — Other Ambulatory Visit: Payer: Self-pay

## 2019-05-28 DIAGNOSIS — C160 Malignant neoplasm of cardia: Secondary | ICD-10-CM

## 2019-05-28 DIAGNOSIS — Z510 Encounter for antineoplastic radiation therapy: Secondary | ICD-10-CM | POA: Diagnosis not present

## 2019-05-28 DIAGNOSIS — K5903 Drug induced constipation: Secondary | ICD-10-CM

## 2019-05-28 DIAGNOSIS — S22000A Wedge compression fracture of unspecified thoracic vertebra, initial encounter for closed fracture: Secondary | ICD-10-CM

## 2019-05-28 DIAGNOSIS — G893 Neoplasm related pain (acute) (chronic): Secondary | ICD-10-CM

## 2019-05-28 DIAGNOSIS — T402X5A Adverse effect of other opioids, initial encounter: Secondary | ICD-10-CM

## 2019-05-28 DIAGNOSIS — Z7189 Other specified counseling: Secondary | ICD-10-CM

## 2019-05-28 MED ORDER — BACLOFEN 10 MG PO TABS: 10.0000 mg | ORAL_TABLET | Freq: Two times a day (BID) | ORAL | 0 refills | Status: AC | PRN

## 2019-05-28 MED ORDER — OXYCODONE HCL 5 MG/5ML PO SOLN: 5.0000 mg | ORAL | 0 refills | Status: DC | PRN

## 2019-05-28 MED ORDER — CALCITONIN (SALMON) 200 UNIT/ACT NA SOLN: 1.0000 | Freq: Every day | NASAL | 0 refills | Status: AC

## 2019-05-28 MED ORDER — LORAZEPAM 1 MG PO TABS: 1.0000 mg | ORAL_TABLET | Freq: Three times a day (TID) | ORAL | 0 refills | Status: AC | PRN

## 2019-05-28 MED ORDER — FENTANYL 12 MCG/HR TD PT72: 1.0000 | MEDICATED_PATCH | TRANSDERMAL | 0 refills | Status: DC

## 2019-05-28 NOTE — Telephone Encounter (Signed)
I spoke with the patient's wife and she is in agreement to meet with palliative care after his case was discussed in palliative conference.     Carola Rhine, PAC

## 2019-05-29 ENCOUNTER — Ambulatory Visit: Payer: Medicare Other | Admitting: Cardiothoracic Surgery

## 2019-05-29 ENCOUNTER — Encounter: Payer: Self-pay | Admitting: Nurse Practitioner

## 2019-05-29 ENCOUNTER — Other Ambulatory Visit (HOSPITAL_COMMUNITY): Payer: Self-pay | Admitting: Oncology

## 2019-05-29 ENCOUNTER — Ambulatory Visit (HOSPITAL_COMMUNITY)
Admission: RE | Admit: 2019-05-29 | Discharge: 2019-05-29 | Disposition: A | Discharge status: Home / Self Care | Payer: Medicare Other | Source: Ambulatory Visit | Attending: Oncology | Admitting: Oncology

## 2019-05-29 ENCOUNTER — Ambulatory Visit
Admission: RE | Admit: 2019-05-29 | Discharge: 2019-05-29 | Disposition: A | Discharge status: Home / Self Care | Payer: Medicare Other | Source: Ambulatory Visit | Attending: Radiation Oncology | Admitting: Radiation Oncology

## 2019-05-29 ENCOUNTER — Other Ambulatory Visit: Payer: Self-pay

## 2019-05-29 ENCOUNTER — Inpatient Hospital Stay: Payer: Medicare Other | Attending: Oncology | Admitting: Nurse Practitioner

## 2019-05-29 VITALS — BP 144/84 | HR 89 | Temp 98.5°F | Resp 18 | Ht 71.0 in | Wt 154.1 lb

## 2019-05-29 VITALS — BP 136/84 | HR 86 | Temp 97.9°F | Resp 20 | Wt 154.0 lb

## 2019-05-29 DIAGNOSIS — K222 Esophageal obstruction: Secondary | ICD-10-CM | POA: Diagnosis not present

## 2019-05-29 DIAGNOSIS — R633 Feeding difficulties, unspecified: Secondary | ICD-10-CM

## 2019-05-29 DIAGNOSIS — C7951 Secondary malignant neoplasm of bone: Secondary | ICD-10-CM | POA: Insufficient documentation

## 2019-05-29 DIAGNOSIS — C160 Malignant neoplasm of cardia: Secondary | ICD-10-CM

## 2019-05-29 DIAGNOSIS — I10 Essential (primary) hypertension: Secondary | ICD-10-CM | POA: Diagnosis not present

## 2019-05-29 DIAGNOSIS — C155 Malignant neoplasm of lower third of esophagus: Secondary | ICD-10-CM | POA: Insufficient documentation

## 2019-05-29 DIAGNOSIS — Z434 Encounter for attention to other artificial openings of digestive tract: Secondary | ICD-10-CM | POA: Insufficient documentation

## 2019-05-29 DIAGNOSIS — Z510 Encounter for antineoplastic radiation therapy: Secondary | ICD-10-CM | POA: Diagnosis not present

## 2019-05-29 DIAGNOSIS — M549 Dorsalgia, unspecified: Secondary | ICD-10-CM | POA: Diagnosis not present

## 2019-05-29 DIAGNOSIS — E785 Hyperlipidemia, unspecified: Secondary | ICD-10-CM | POA: Diagnosis not present

## 2019-05-29 DIAGNOSIS — K5900 Constipation, unspecified: Secondary | ICD-10-CM | POA: Insufficient documentation

## 2019-05-29 DIAGNOSIS — D509 Iron deficiency anemia, unspecified: Secondary | ICD-10-CM | POA: Diagnosis not present

## 2019-05-29 HISTORY — PX: IR REPLC DUODEN/JEJUNO TUBE PERCUT W/FLUORO: IMG2334

## 2019-05-29 MED ORDER — LIDOCAINE VISCOUS HCL 2 % MT SOLN
OROMUCOSAL | Status: DC | PRN
  Administered 2019-05-29: 15 mL via OROMUCOSAL

## 2019-05-29 MED ORDER — LIDOCAINE VISCOUS HCL 2 % MT SOLN
OROMUCOSAL | Status: AC
  Filled 2019-05-29: qty 15

## 2019-05-29 MED ORDER — IOHEXOL 300 MG/ML  SOLN
50.0000 mL | Freq: Once | INTRAMUSCULAR | Status: AC | PRN
  Administered 2019-05-29: 15 mL

## 2019-05-29 NOTE — Consult Note (Signed)
  Consultation Note Date: 05/29/2019   Patient Name: [name] [name]  DOB: 31-Jul-1950  MRN: [id number]  Age / Sex: 68 y.o., male   PCP: Binnie Rail, MD Referring Physician: Ledon Snare, MD  Reason for Consultation: Establishing goals of care, Pain control and Psychosocial/spiritual support  Palliative Care Assessment and Recommendations    Contacts/Participants in Discussion: I met with [name] [name] and his wife [name] [name] during his radiation oncology appointment today.  Clinical Assessment/Narrative: [name] [name] and his wife [name] [name] are engaged and informed and have multiple concerns they are requesting help with on today's visit.   Cancer Related Pain: Significant issues with pain. He tells me it is nearly unbearable at times, he cannot get comfortable and has not been sleeping well at night, his wife [name] [name] says it is so hard to see him suffering. I explained the pathologic compression fracture and why the pain is so difficult to control. He has been taking oxycodone around the clock, every 4 hours. Start Duragesic 12.5 mcg TD for basal pain control Continue to use Oxycodone 94m q2 prn for breakthrough pain Calcitonin for pain related to compression fracture Continue palliative radiation for T-Spine pain relief  Anticipatory Anxiety:  Mr. FSpadaforedescribes a feeling of intense panic that occurs when he has a spasm in throat- he has uncontrollable coughing and a sense that he cannot breath especially when he lies flat-he had a very difficult time having an MRI done and thought that he was going to die-he says he will never do this again. He also has anxiety prior to taking anything PO-and can barely get anything down other than water. He is struggling with all of his complications-he went into cancer treatment thinking that he would be back to normal by now-he is grieving the progression. I reminded him that all of his nutritional requirements are being met through his G-Tube nutrition and to  not push the PO intake and any PO intake was mostly for pleasure and for comfort. He is having a hard time accepting the G-tube as permanent and thought that is would only be temporary when it was initially placed. Ativan Intensol for anxiety PRN 163mfor panic, 0.2599mor generalized anxiety q4 prn  Hiccups/Esophageal Spasm: Severe at times. Will do a trial of PRN baclofen.  Constipation Severe at times. He is on Sennakot- May be helpful to add miralax to regimen through his tube or magnesium citrate. If needed can prescribe relistor for opioid induced constipation.    Code Status/Advance Care Planning: Patient has a living will, he will bring in a copy He desires DNR status in the event of cardiac arrest- will need to sign the forms (ran out of time on todays' visit).  Prognosis: deferred to medical oncology, patient understands cancer is not curable and progression will limit his survival.   Follow-up: 2 weeks -virtual visit ok  Time Total: 50 minutes Greater than 50%  of this time was spent counseling and coordinating care related to the above assessment and plan.  Signed by: EliLane HackerO  GolAcquanetta ChainO  05/29/2019, 7:20 AM  Please contact Palliative Medicine Team phone at [phone] questions and concerns.

## 2019-05-29 NOTE — Progress Notes (Signed)
[address]       [place] [address]             [phone]                  [name] [name] [place] Medical Record [id number] Date of Birth: 1951/03/11  Referring BP:[id number], [name], MD Primary Cardiology: Primary Care:Burns, [name], MD  Chief Complaint:  Follow Up Visit  Cancer Staging Adenocarcinoma of gastroesophageal junction [facility]) Staging form: Esophagus - Adenocarcinoma, AJCC 8th Edition - Pathologic stage from 11/28/2018: Stage IVA (ypT4a, pN2, cM0, G2) - Signed by [name], MD on 12/01/2018  OPERATIVE REPORT DATE OF PROCEDURE:  11/25/2018 PREOPERATIVE DIAGNOSIS:  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 SURGICAL PROCEDURE:  Video bronchoscopy, transhiatal total esophagectomy with cervical esophagogastrostomy, pyloroplasty, feeding jejunostomy, bilateral placement of chest tubes. SURGEON:  [name], MD  History of Present Illness:     Patient returns office today for follow-up visit.  Initially with Dr. [name] dilatation and placement of stent in the cervical esophagus the patient was back eating and more comfortable .  Patient is able to take some liquids now, the stent has slipped below the area of stricture and could not be repositioned last week.  MRI confirms suspicion of bony metastasis currently getting radiation.  Refer to palliative care, currently making arrangements to get fentanyl patch   Zubrod Score: At the time of surgery this patient's most appropriate activity status/level should be described as: []     0    Normal activity, no symptoms []     1    Restricted in physical strenuous activity but ambulatory, able to do out light work [x]     2    Ambulatory and capable of self care, unable to do work activities, up and about                 >50 % of waking hours                                                                                    []     3    Only limited self care, in bed greater than 50% of waking hours []     4    Completely disabled, no self care, confined to bed or chair []     5    Moribund  Social History   Tobacco Use  Smoking Status Former Smoker  . Packs/day: 0.25  . Years: 50.00  . Pack years: 12.50  . Types: Cigarettes  . Quit date: 06/19/2018  . Years since quitting: 0.9  Smokeless Tobacco Never Used  Tobacco Comment   06/02/14 2-3 pp week       No Known Allergies  Current Outpatient Medications  Medication Sig Dispense Refill  . Hydrocortisone (Linzey Ramser'S BUTT CREAM) CREA Apply 1 application topically 2 (two) times daily as needed for irritation (apply around g tube). (Patient taking differently: Apply 1 application topically daily. ) 1 each 1  . Nutritional Supplements (FEEDING SUPPLEMENT, OSMOLITE 1.5 CAL,) LIQD Place 237  mLs into feeding tube continuous. Uses 7 cans over 24 hours    . oxyCODONE (ROXICODONE) 5 MG/5ML solution Take 5 mLs (5 mg total) by mouth every 2 (two) hours as needed for breakthrough pain. 473 mL 0  . promethazine (PHENERGAN) 25 MG tablet Take 25 mg by mouth every 6 (six) hours as needed for nausea or vomiting.    . baclofen (LIORESAL) 10 MG tablet Take 1 tablet (10 mg total) by mouth 2 (two) times daily as needed for muscle spasms (Hiccups or spasm in throat). (Patient not taking: Reported on 05/29/2019) 30 each 0  . calcitonin, salmon, (MIACALCIN) 200 UNIT/ACT nasal spray Place 1 spray into alternate nostrils daily for 30 doses. (Patient not taking: Reported on 05/29/2019) 2.7 mL 0  . fentaNYL (DURAGESIC) 12 MCG/HR Place 1 patch onto the skin every 3 (three) days. (Patient not taking: Reported on 05/29/2019) 5 patch 0  . LORazepam (ATIVAN) 1 MG tablet Take 1 tablet (1 mg total) by mouth every 8 (eight) hours as needed for anxiety, sedation or sleep. Crush tablet and dissolve in warm water and administer through G-Tube  (Patient not taking: Reported on 05/29/2019) 60 tablet 0   No current facility-administered medications for this visit.   Facility-Administered Medications Ordered in Other Visits  Medication Dose Route Frequency Provider Last Rate Last Admin  . alum & mag hydroxide-simeth (MAALOX/MYLANTA) 200-200-20 MG/5ML suspension 30 mL  30 mL Oral Once Harle Stanford., PA-C       And  . lidocaine (XYLOCAINE) 2 % viscous mouth solution 15 mL  15 mL Oral Once Harle Stanford., PA-C           Physical Exam: BP 136/84 (BP Location: Right Arm)   Pulse 86   Temp 97.9 F (36.6 C) (Skin)   Resp 20   Wt 69.9 kg   SpO2 95% Comment: RA  BMI 21.48 kg/m  General appearance: alert, cooperative and mild distress Neck: no adenopathy, no carotid bruit, no JVD, supple, symmetrical, trachea midline and thyroid not enlarged, symmetric, no tenderness/mass/nodules Lymph nodes: Cervical, supraclavicular, and axillary nodes normal. Cardio: regular rate and rhythm, S1, S2 normal, no murmur, click, rub or gallop Extremities: extremities normal, atraumatic, no cyanosis or edema and Homans sign is negative, no sign of DVT Neurologic: Grossly normal  Feeding jejunostomy tube is in place-balloon was deflated and reinflated today  Diagnostic Studies & Laboratory data:         Recent Radiology Findings: None Recent Labs: Lab Results  Component Value Date   WBC 6.6 02/09/2019   HGB 10.8 (L) 02/09/2019   HCT 35.2 (L) 02/09/2019   PLT 193 02/09/2019   GLUCOSE 128 (H) 02/09/2019   CHOL 161 05/06/2018   TRIG 134.0 05/06/2018   HDL 54.20 05/06/2018   LDLDIRECT 131.7 12/27/2009   LDLCALC 80 05/06/2018   ALT 23 02/06/2019   AST 21 02/06/2019   NA 140 02/09/2019   K 4.0 02/09/2019   CL 107 02/09/2019   CREATININE 0.70 05/08/2019   BUN 14 02/09/2019   CO2 28 02/09/2019   TSH 1.24 05/06/2018   INR 1.1 11/22/2018   HGBA1C 5.4 05/06/2018   Wt Readings from Last 3 Encounters:  05/29/19 69.9 kg  05/23/19 70 kg   05/09/19 69.3 kg     Assessment / Plan:   Continue supportive care Pain medicine controlled Feeding tube care Complete radiation for palliative bony pain See back as needed or 4 weeks-told patient he is free to call with  a virtual visit, as needed     [name] 05/29/2019 9:44 AM

## 2019-05-29 NOTE — Progress Notes (Addendum)
[facility] OFFICE PROGRESS NOTE   Diagnosis: Gastroesophageal cancer  INTERVAL HISTORY:   [name] [name] returns as scheduled.  He began palliative radiation to the thoracic spine 05/22/2019.  On 05/23/2019 he underwent an EGD due to concern for esophageal stent migration.  The stent could not be moved back into position.  A proximal esophageal stricture was noted status post balloon dilatation.  He continues to have back pain.  He takes oxycodone without significant relief.  He was prescribed a fentanyl patch yesterday and plans to begin this today.  No extremity weakness or numbness.  He is constipated, last bowel movement 4 days ago.  He is currently taking Senokot-S.  He has recently noted motion sickness.  Feeding tube is leaking.  Objective:  Vital signs in last 24 hours:  Blood pressure (!) 144/84, pulse 89, temperature 98.5 F (36.9 C), temperature source Temporal, resp. rate 18, height '5\' 11"'  (1.803 m), weight 154 lb 1.6 oz (69.9 kg), SpO2 97 %.    HEENT: No thrush or ulcers. GI: Abdomen soft and nontender.  No hepatomegaly.  Feeding tube bandage with drainage. Vascular: No leg edema. Neuro: Alert and oriented.   Lab Results:  Lab Results  Component Value Date   WBC 6.6 02/09/2019   HGB 10.8 (L) 02/09/2019   HCT 35.2 (L) 02/09/2019   MCV 91.2 02/09/2019   PLT 193 02/09/2019   NEUTROABS 5.2 02/09/2019    Imaging:  No results found.  Medications: I have reviewed the patient's current medications.  Assessment/Plan: 1. 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 Upper endoscopy 07/25/2018-esophageal mass at 38-41 cm extending to the GE junction and gastric cardia, biopsy confirmed adenocarcinoma ? CTs 08/01/2018-wall thickening at the distal esophagus extending to the GE junction and gastric cardia, gastrohepatic ligament lymphadenopathy, perigastric lymph node, too small to characterize liver lesion, bilateral adrenal adenomas ?  PET scan 08/09/2018-hypermetabolic mass spanning the gastroesophageal junction. Clustered gastrohepatic ligament lymph nodes likely involved with maximum SUV 3.2. ? Radiation 08/21/2018-09/27/2018 ? Cycle 1 weekly Taxol/carboplatin 08/21/2018 ? Cycle 2 weekly Taxol/carboplatin 08/27/2018 ? Cycle 3 weekly Taxol/carboplatin 09/03/2018 ? Cycle 4 weekly Taxol/carboplatin 09/10/2018 ? Cycle 5 weekly Taxol/carboplatin 09/17/2018 ? CTs 10/24/2018-gastric cardia portion of the gastroesophageal mass less striking than on the 08/09/2018 exam. Adjacent gastrohepatic ligament adenopathy is still present with clustered nodes measuring 1.2 cm. Aortocaval node measures 0.8 cm, formerly the same, not formally hypermetabolic. ? Esophagogastrectomy 11/25/2018-moderate to poorly differentiated adenocarcinoma measuring at least 20 cm involving the distal esophagus, GE junction, and proximal stomach. Tumor midpoint and the proximal stomach. Tumor invades the visceral peritoneum. Proximal and distal resection margins are positive, 5/13 lymph nodes positive, lymphovascular and perineural invasion present, absent treatment effect, pT4a,pN2;foundation 1-microsatellite stable, tumor mutational burden 1, BRCA2; PDL 1 combined positive score 1;HER-2/neu negative by IHC ? CT neck 02/06/2019- extraluminal gas in the mediastinum and right neck, sclerotic lesions at T2, T4, and the left second rib consistent with bone metastases ? CT chest 02/06/2019- pneumomediastinum, proximal esophageal stricture with ill-defined soft tissue, sclerotic lesion at T4, new?Marland Kitchen  A potential new lesion at T2 ? MRI thoracic spine 05/08/2019-osseous metastatic disease at T1-T6, and potentially L1.  Compression fractures T3-T5, moderate left and small right pleural effusions ? Palliative radiation 05/22/2019  2. Iron deficiency anemia secondary to #1,improved 3. Colon polyps, tubular adenomas, identified on colonoscopy 07/25/2018 4. Hypertension 5.  Hyperlipidemia 6. Tobacco use 7. 02/06/2019 -hospital admission for pneumomediastinum after undergoing an upper endoscopy/dilatation and esophageal biopsy-ulcerated squamous  mucosa with no malignancy identified, antibiotics/n.p.o.-repeat esophagram 02/10/2019-persistent narrowing in the proximal esophagus, no leak  Esophagram 02/17/2019-high-grade stricture at the esophagogastric anastomosis, no leak  Repeat upper endoscopy procedures with placement of an esophageal stent September and October 2020, stent migrated into the stomach and was removed, esophageal stricture improved  Disposition: [name] [name] has metastatic gastroesophageal cancer.  He is currently completing palliative radiation to the thoracic spine.  He is scheduled to complete the course of radiation 06/04/2019.  We had a brief discussion at today's visit regarding systemic therapy.  He would like to complete the course of radiation and return for a follow-up visit for further discussion.  We contacted interventional radiology regarding leakage from the jejunostomy tube.  He will be seen in interventional radiology later today.  For the constipation we recommend he begin daily MiraLAX.  He will return for a follow-up visit 06/09/2019.  He will contact the office in the interim with any problems.  Patient seen with Dr. Benay Spice.  Ned Card ANP/GNP-BC   05/29/2019  2:49 PM  This was a shared visit with Ned Card.  [name] [name] continues palliative radiation to the thoracic spine.  He was started on a Duragesic patch by the palliative care service and will continue Duragesic and oxycodone for pain.  The plan is to consider salvage systemic therapy at the completion of radiation.  Julieanne Manson, MD

## 2019-05-29 NOTE — Procedures (Signed)
Interventional Radiology Procedure Note  Procedure: Replacement of a direct jejunostomy. Old was damaged at the hub.  New 50F direct J, jejunostomy placed, modified length.  Complications: None Recommendations:  - Ok to use    Signed,  Dulcy Fanny. Earleen Newport, DO

## 2019-05-30 ENCOUNTER — Other Ambulatory Visit: Payer: Self-pay

## 2019-05-30 ENCOUNTER — Ambulatory Visit
Admission: RE | Admit: 2019-05-30 | Discharge: 2019-05-30 | Disposition: A | Discharge status: Home / Self Care | Payer: Medicare Other | Source: Ambulatory Visit | Attending: Radiation Oncology | Admitting: Radiation Oncology

## 2019-05-30 ENCOUNTER — Encounter: Payer: Self-pay | Admitting: Internal Medicine

## 2019-05-30 ENCOUNTER — Other Ambulatory Visit: Payer: Self-pay | Admitting: Internal Medicine

## 2019-05-30 DIAGNOSIS — Z510 Encounter for antineoplastic radiation therapy: Secondary | ICD-10-CM | POA: Diagnosis not present

## 2019-05-30 MED ORDER — PROMETHAZINE HCL 25 MG PO TABS: 25.0000 mg | ORAL_TABLET | Freq: Four times a day (QID) | ORAL | 6 refills | Status: DC | PRN

## 2019-05-30 NOTE — Progress Notes (Signed)
Refilled phenergan for nausea.

## 2019-06-02 ENCOUNTER — Other Ambulatory Visit: Payer: Self-pay

## 2019-06-02 ENCOUNTER — Ambulatory Visit
Admission: RE | Admit: 2019-06-02 | Discharge: 2019-06-02 | Disposition: A | Discharge status: Home / Self Care | Payer: Medicare Other | Source: Ambulatory Visit | Attending: Radiation Oncology | Admitting: Radiation Oncology

## 2019-06-02 DIAGNOSIS — Z510 Encounter for antineoplastic radiation therapy: Secondary | ICD-10-CM | POA: Diagnosis not present

## 2019-06-03 ENCOUNTER — Ambulatory Visit
Admission: RE | Admit: 2019-06-03 | Discharge: 2019-06-03 | Disposition: A | Discharge status: Home / Self Care | Payer: Medicare Other | Source: Ambulatory Visit | Attending: Radiation Oncology | Admitting: Radiation Oncology

## 2019-06-03 ENCOUNTER — Other Ambulatory Visit: Payer: Self-pay

## 2019-06-03 DIAGNOSIS — Z510 Encounter for antineoplastic radiation therapy: Secondary | ICD-10-CM | POA: Diagnosis not present

## 2019-06-04 ENCOUNTER — Other Ambulatory Visit: Payer: Self-pay

## 2019-06-04 ENCOUNTER — Encounter: Payer: Self-pay | Admitting: Radiation Oncology

## 2019-06-04 ENCOUNTER — Ambulatory Visit
Admission: RE | Admit: 2019-06-04 | Discharge: 2019-06-04 | Disposition: A | Discharge status: Home / Self Care | Payer: Medicare Other | Source: Ambulatory Visit | Attending: Radiation Oncology | Admitting: Radiation Oncology

## 2019-06-04 DIAGNOSIS — Z510 Encounter for antineoplastic radiation therapy: Secondary | ICD-10-CM | POA: Diagnosis not present

## 2019-06-09 ENCOUNTER — Other Ambulatory Visit: Payer: Self-pay | Admitting: Radiation Oncology

## 2019-06-09 ENCOUNTER — Inpatient Hospital Stay: Payer: Medicare Other | Admitting: Oncology

## 2019-06-09 ENCOUNTER — Telehealth: Payer: Self-pay | Admitting: *Deleted

## 2019-06-09 DIAGNOSIS — C160 Malignant neoplasm of cardia: Secondary | ICD-10-CM

## 2019-06-09 MED ORDER — FENTANYL 25 MCG/HR TD PT72: 1.0000 | MEDICATED_PATCH | TRANSDERMAL | 0 refills | Status: DC

## 2019-06-09 MED ORDER — OXYCODONE HCL 5 MG/5ML PO SOLN: 5.0000 mg | ORAL | 0 refills | Status: AC | PRN

## 2019-06-09 NOTE — Telephone Encounter (Signed)
CALLED [facility] TO ARRANGE THIS SERVICE FOR THIS PATIENT, SPOKE WITH AMBER AND FAXED REFERRAL AND AMBER STATED THAT SHE WOULD TAKE CARE OF THIS

## 2019-06-09 NOTE — Progress Notes (Addendum)
I spoke with the patient's wife and they have decided to proceed with hospice care. A referral was placed to [facility]. I also refilled his Oxycodone, and increased his fentanyl patch based on pain relief and 24 hour morphine dose demands and sent in a new prescription for this as well.     Carola Rhine, PAC

## 2019-06-10 ENCOUNTER — Other Ambulatory Visit: Payer: Self-pay | Admitting: Internal Medicine

## 2019-06-10 ENCOUNTER — Telehealth: Payer: Self-pay | Admitting: *Deleted

## 2019-06-10 ENCOUNTER — Other Ambulatory Visit: Payer: Self-pay | Admitting: Radiation Oncology

## 2019-06-10 MED ORDER — FENTANYL 25 MCG/HR TD PT72: 1.0000 | MEDICATED_PATCH | TRANSDERMAL | 0 refills | Status: AC

## 2019-06-10 NOTE — Telephone Encounter (Signed)
Called patient and spoke w/wife. They agree to phone visit with Dr. Benay Spice on 06/12/19 at 0900. Hospice nurse was visiting when RN called.

## 2019-06-11 ENCOUNTER — Other Ambulatory Visit: Payer: Self-pay | Admitting: *Deleted

## 2019-06-11 ENCOUNTER — Telehealth: Payer: Self-pay | Admitting: Oncology

## 2019-06-11 NOTE — Telephone Encounter (Signed)
Contacted patient to verify

## 2019-06-12 ENCOUNTER — Telehealth: Payer: Self-pay | Admitting: Oncology

## 2019-06-12 ENCOUNTER — Other Ambulatory Visit: Payer: Self-pay | Admitting: *Deleted

## 2019-06-12 ENCOUNTER — Inpatient Hospital Stay (HOSPITAL_BASED_OUTPATIENT_CLINIC_OR_DEPARTMENT_OTHER): Payer: Medicare Other | Admitting: Oncology

## 2019-06-12 DIAGNOSIS — C160 Malignant neoplasm of cardia: Secondary | ICD-10-CM | POA: Diagnosis not present

## 2019-06-12 DIAGNOSIS — C7951 Secondary malignant neoplasm of bone: Secondary | ICD-10-CM | POA: Diagnosis not present

## 2019-06-12 DIAGNOSIS — D509 Iron deficiency anemia, unspecified: Secondary | ICD-10-CM

## 2019-06-12 DIAGNOSIS — M549 Dorsalgia, unspecified: Secondary | ICD-10-CM

## 2019-06-12 DIAGNOSIS — G893 Neoplasm related pain (acute) (chronic): Secondary | ICD-10-CM

## 2019-06-12 DIAGNOSIS — R110 Nausea: Secondary | ICD-10-CM

## 2019-06-12 DIAGNOSIS — Z9221 Personal history of antineoplastic chemotherapy: Secondary | ICD-10-CM

## 2019-06-12 DIAGNOSIS — E785 Hyperlipidemia, unspecified: Secondary | ICD-10-CM

## 2019-06-12 DIAGNOSIS — K5900 Constipation, unspecified: Secondary | ICD-10-CM

## 2019-06-12 DIAGNOSIS — I10 Essential (primary) hypertension: Secondary | ICD-10-CM

## 2019-06-12 DIAGNOSIS — Z923 Personal history of irradiation: Secondary | ICD-10-CM

## 2019-06-12 DIAGNOSIS — Z79899 Other long term (current) drug therapy: Secondary | ICD-10-CM

## 2019-06-12 MED ORDER — PROMETHAZINE HCL 6.25 MG/5ML PO SYRP: 12.5000 mg | ORAL_SOLUTION | Freq: Four times a day (QID) | ORAL | 2 refills | Status: AC | PRN

## 2019-06-12 NOTE — Progress Notes (Signed)
[place] OFFICE VISIT PROGRESS NOTE  I connected with [name] [name] on 06/12/19 at  9:00 AM EST by telephone and verified that I am speaking with the correct person using two identifiers.   I discussed the limitations, risks, security and privacy concerns of performing an evaluation and management service by telemedicine and the availability of in-person appointments. I also discussed with the patient that there may be a patient responsible charge related to this service. The patient expressed understanding and agreed to proceed.  Other persons participating in the visit and their role in the encounter: Wife  Patient's location: Home Provider's location: Office   Diagnosis: Gastroesophageal cancer  INTERVAL HISTORY:   [name] [name] is seen for a telehealth visit.  This is secondary to the Covid pandemic.  He has enrolled in the home hospice care.  He reports he requested a hospice referral.  He continues to have pain at the upper back.  He also has pain related to a sacral ulcer.  The Duragesic patch was increased to 25 mcg yesterday.  He takes oxycodone every 2 hours for breakthrough pain.  He has been unable to swallow for the past 8-9 days.  He has nausea with motion.   Lab Results:  Lab Results  Component Value Date   WBC 6.6 02/09/2019   HGB 10.8 (L) 02/09/2019   HCT 35.2 (L) 02/09/2019   MCV 91.2 02/09/2019   PLT 193 02/09/2019   NEUTROABS 5.2 02/09/2019    Medications: I have reviewed the patient's current medications.  Assessment/Plan: 1. 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 Upper endoscopy 07/25/2018-esophageal mass at 38-41 cm extending to the GE junction and gastric cardia, biopsy confirmed adenocarcinoma ? CTs 08/01/2018-wall thickening at the distal esophagus extending to the GE junction and gastric cardia, gastrohepatic ligament lymphadenopathy, perigastric lymph node, too small to  characterize liver lesion, bilateral adrenal adenomas ? PET scan 08/09/2018-hypermetabolic mass spanning the gastroesophageal junction. Clustered gastrohepatic ligament lymph nodes likely involved with maximum SUV 3.2. ? Radiation 08/21/2018-09/27/2018 ? Cycle 1 weekly Taxol/carboplatin 08/21/2018 ? Cycle 2 weekly Taxol/carboplatin 08/27/2018 ? Cycle 3 weekly Taxol/carboplatin 09/03/2018 ? Cycle 4 weekly Taxol/carboplatin 09/10/2018 ? Cycle 5 weekly Taxol/carboplatin 09/17/2018 ? CTs 10/24/2018-gastric cardia portion of the gastroesophageal mass less striking than on the 08/09/2018 exam. Adjacent gastrohepatic ligament adenopathy is still present with clustered nodes measuring 1.2 cm. Aortocaval node measures 0.8 cm, formerly the same, not formally hypermetabolic. ? Esophagogastrectomy 11/25/2018-moderate to poorly differentiated adenocarcinoma measuring at least 20 cm involving the distal esophagus, GE junction, and proximal stomach. Tumor midpoint and the proximal stomach. Tumor invades the visceral peritoneum. Proximal and distal resection margins are positive, 5/13 lymph nodes positive, lymphovascular and perineural invasion present, absent treatment effect, pT4a,pN2;foundation 1-microsatellite stable, tumor mutational burden 1, BRCA2; PDL 1 combined positive score 1;HER-2/neu negative by IHC ? CT neck 02/06/2019- extraluminal gas in the mediastinum and right neck, sclerotic lesions at T2, T4, and the left second rib consistent with bone metastases ? CT chest 02/06/2019- pneumomediastinum, proximal esophageal stricture with ill-defined soft tissue, sclerotic lesion at T4, new?Marland Kitchen  A potential new lesion at T2 ? MRI thoracic spine 05/08/2019-osseous metastatic disease at T1-T6, and potentially L1.  Compression fractures T3-T5, moderate left and small right pleural effusions ? Palliative radiation 05/22/2019-06/04/2019  2. Iron deficiency anemia secondary to #1,improved 3. Colon polyps, tubular adenomas,  identified on colonoscopy 07/25/2018 4. Hypertension 5. Hyperlipidemia 6. Tobacco use 7. 02/06/2019 -hospital admission for pneumomediastinum  after undergoing an upper endoscopy/dilatation and esophageal biopsy-ulcerated squamous mucosa with no malignancy identified, antibiotics/n.p.o.-repeat esophagram 02/10/2019-persistent narrowing in the proximal esophagus, no leak  Esophagram 02/17/2019-high-grade stricture at the esophagogastric anastomosis, no leak  Repeat upper endoscopy procedures with placement of an esophageal stent September and October 2020, stent migrated into the stomach and was removed, esophageal stricture improved   Disposition: [name] [name] has metastatic gastroesophageal cancer.  He is symptomatic with pain related to metastatic disease involving the spine.  He completed palliative radiation 06/04/2019, but the pain persists.  He will increase the Duragesic patch to 50 mcg if the pain is not improved by 06/14/2019.  He will take oxycodone at a dose of 10 mg every 2-4 hours as needed for breakthrough pain.  He will begin MiraLAX for constipation.  He is unable to take pills by mouth and continues tube feedings.  We will prescribe liquid Phenergan to use as needed for nausea.  We discussed his decision to enroll in hospice care.  He confirmed he does not wish to consider chemotherapy.  He has been placed on a no CODE BLUE status.  [name] [name] would like to schedule a follow-up telehealth visit in approximately 2 weeks.  He will contact us in the interim as needed.   I discussed the assessment and treatment plan with the patient. The patient was provided an opportunity to ask questions and all were answered. The patient agreed with the plan and demonstrated an understanding of the instructions.   The patient was advised to call back or seek an in-person evaluation if the symptoms worsen or if the condition fails to improve as anticipated.  I provided 22 minutes of telephone, chart  review, and documentation time during this encounter, and > 50% was spent counseling as documented under my assessment & plan.  Betsy Coder ANP/GNP-BC   06/12/2019 8:42 AM

## 2019-06-12 NOTE — Telephone Encounter (Signed)
I talk with patient regarding schedule  

## 2019-06-25 ENCOUNTER — Other Ambulatory Visit: Payer: Self-pay | Admitting: Internal Medicine

## 2019-06-26 ENCOUNTER — Ambulatory Visit: Payer: Medicare Other | Admitting: Cardiothoracic Surgery

## 2019-06-26 ENCOUNTER — Inpatient Hospital Stay: Payer: Medicare Other | Attending: Oncology | Admitting: Oncology

## 2019-06-26 DIAGNOSIS — I10 Essential (primary) hypertension: Secondary | ICD-10-CM

## 2019-06-26 DIAGNOSIS — D500 Iron deficiency anemia secondary to blood loss (chronic): Secondary | ICD-10-CM

## 2019-06-26 DIAGNOSIS — C7951 Secondary malignant neoplasm of bone: Secondary | ICD-10-CM | POA: Diagnosis not present

## 2019-06-26 DIAGNOSIS — R110 Nausea: Secondary | ICD-10-CM | POA: Diagnosis not present

## 2019-06-26 DIAGNOSIS — G893 Neoplasm related pain (acute) (chronic): Secondary | ICD-10-CM | POA: Diagnosis not present

## 2019-06-26 DIAGNOSIS — E785 Hyperlipidemia, unspecified: Secondary | ICD-10-CM

## 2019-06-26 DIAGNOSIS — C160 Malignant neoplasm of cardia: Secondary | ICD-10-CM

## 2019-06-26 NOTE — Progress Notes (Signed)
[name] OFFICE VISIT PROGRESS NOTE  I connected with [name] [name] on 06/26/19 at  9:00 AM EST by telephone and verified that I am speaking with the correct person using two identifiers.   I discussed the limitations, risks, security and privacy concerns of performing an evaluation and management service by telemedicine and the availability of in-person appointments. I also discussed with the patient that there may be a patient responsible charge related to this service. The patient expressed understanding and agreed to proceed.  Other persons participating in the visit and their role in the encounter: Wife  Patient's location: Home Provider's location: Office   Diagnosis: Gastroesophageal cancer  INTERVAL HISTORY:   DuaneClements is seen for a telehealth visit.  This is due to his medical condition and the Covid pandemic. He is enrolled in the home hospice program.  He reports this is working well.  The Duragesic patch has been increased to 100 mcg.  He continues oxycodone for breakthrough pain.  This regimen is working well.  He has intermittent nausea.  Phenergan does not help.  He cannot swallow and is "gagging ".  He recently received a suction device.  He is ambulatory in the home.    Lab Results:  Lab Results  Component Value Date   WBC 6.6 02/09/2019   HGB 10.8 (L) 02/09/2019   HCT 35.2 (L) 02/09/2019   MCV 91.2 02/09/2019   PLT 193 02/09/2019   NEUTROABS 5.2 02/09/2019     Medications: I have reviewed the patient's current medications.  Assessment/Plan: 1. 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 Upper endoscopy 07/25/2018-esophageal mass at 38-41 cm extending to the GE junction and gastric cardia, biopsy confirmed adenocarcinoma ? CTs 08/01/2018-wall thickening at the distal esophagus extending to the GE junction and gastric cardia, gastrohepatic ligament lymphadenopathy, perigastric lymph  node, too small to characterize liver lesion, bilateral adrenal adenomas ? PET scan 08/09/2018-hypermetabolic mass spanning the gastroesophageal junction. Clustered gastrohepatic ligament lymph nodes likely involved with maximum SUV 3.2. ? Radiation 08/21/2018-09/27/2018 ? Cycle 1 weekly Taxol/carboplatin 08/21/2018 ? Cycle 2 weekly Taxol/carboplatin 08/27/2018 ? Cycle 3 weekly Taxol/carboplatin 09/03/2018 ? Cycle 4 weekly Taxol/carboplatin 09/10/2018 ? Cycle 5 weekly Taxol/carboplatin 09/17/2018 ? CTs 10/24/2018-gastric cardia portion of the gastroesophageal mass less striking than on the 08/09/2018 exam. Adjacent gastrohepatic ligament adenopathy is still present with clustered nodes measuring 1.2 cm. Aortocaval node measures 0.8 cm, formerly the same, not formally hypermetabolic. ? Esophagogastrectomy 11/25/2018-moderate to poorly differentiated adenocarcinoma measuring at least 20 cm involving the distal esophagus, GE junction, and proximal stomach. Tumor midpoint and the proximal stomach. Tumor invades the visceral peritoneum. Proximal and distal resection margins are positive, 5/13 lymph nodes positive, lymphovascular and perineural invasion present, absent treatment effect, pT4a,pN2;foundation 1-microsatellite stable, tumor mutational burden 1, BRCA2; PDL 1 combined positive score 1;HER-2/neu negative by IHC ? CT neck 02/06/2019- extraluminal gas in the mediastinum and right neck, sclerotic lesions at T2, T4, and the left second rib consistent with bone metastases ? CT chest 02/06/2019- pneumomediastinum, proximal esophageal stricture with ill-defined soft tissue, sclerotic lesion at T4, new?Marland Kitchen  A potential new lesion at T2 ? MRI thoracic spine 05/08/2019-osseous metastatic disease at T1-T6, and potentially L1.  Compression fractures T3-T5, moderate left and small right pleural effusions ? Palliative radiation 05/22/2019-06/04/2019  2. Iron deficiency anemia secondary to #1,improved 3. Colon  polyps, tubular adenomas, identified on colonoscopy 07/25/2018 4. Hypertension 5. Hyperlipidemia 6. Tobacco use 7. 02/06/2019 -hospital admission for pneumomediastinum after  undergoing an upper endoscopy/dilatation and esophageal biopsy-ulcerated squamous mucosa with no malignancy identified, antibiotics/n.p.o.-repeat esophagram 02/10/2019-persistent narrowing in the proximal esophagus, no leak  Esophagram 02/17/2019-high-grade stricture at the esophagogastric anastomosis, no leak  Repeat upper endoscopy procedures with placement of an esophageal stent September and October 2020, stent migrated into the stomach and was removed, esophageal stricture improved 8.  Pain secondary to metastatic gastroesophageal cancer    Disposition: [name] [name] has metastatic gastroesophageal cancer.  He is enrolled in home hospice care.  His pain is currently adequately controlled with Duragesic and oxycodone.  He will try lorazepam for nausea.  He plans to continue follow-up with the home hospice team.  I am available to see him in the future as needed.  He is not scheduled for a follow-up appointment in the oncology clinic.  He will contact us as needed.   I discussed the assessment and treatment plan with the patient. The patient was provided an opportunity to ask questions and all were answered. The patient agreed with the plan and demonstrated an understanding of the instructions.   The patient was advised to call back or seek an in-person evaluation if the symptoms worsen or if the condition fails to improve as anticipated.  I provided 15 minutes of telephone, chart review, and documentation time during this encounter, and > 50% was spent counseling as documented under my assessment & plan.  Betsy Coder ANP/GNP-BC   06/26/2019 9:05 AM

## 2019-07-02 ENCOUNTER — Telehealth: Payer: Self-pay | Admitting: Radiation Oncology

## 2019-07-02 NOTE — Telephone Encounter (Addendum)
  Radiation Oncology         [phone]) [phone] ________________________________  Name: [name] [name] MRN: [id number]  Date of Service: 07/02/2019  DOB: Mar 16, 1951  Post Treatment Telephone Note  Diagnosis:   Stage IV gastroesophageal adenocarcinoma  Interval Since Last Radiation:  4 weeks    05/22/2019-06/04/2019: The thoracic spine T1-T6 region was treated to 30 Gy in 10 fractions.  09/11/2018-09/27/2018:  The esophagus was treated to 45 Gy in 25 fractions, followed by a 5.4 Gy boost in 3 fractions.  Narrative:  The patient was contacted today for routine follow-up. During treatment he did very well with radiotherapy and did not have significant desquamation. He is now set up with hospice and is receiving good care per his wife.  Impression/Plan: 1.  Stage IV gastroesophageal adenocarcinoma. The patient is doing well under the care of hospice. We will follow up as needed.     Carola Rhine, PAC

## 2019-07-25 MED FILL — GERHARDT'S BUTT CREAM: 100000 | 30 days supply | Qty: 60 | Fill #2

## 2019-08-08 NOTE — Progress Notes (Signed)
  Radiation Oncology         [phone]) [phone] ________________________________  Name: [name] [name] MRN: [id number]  Date: 06/04/2019  DOB: 08-25-1950  End of Treatment Note  Diagnosis:   Bone metastasis     Indication for treatment::  palliative       Radiation treatment dates:   05/22/19 - 06/04/19  Site/dose:   The patient was treated to the T-spine 1-6 to a dose of 30 Gy in 10 fractions using a 2-field 3D conformal technique.  Narrative: The patient tolerated radiation treatment relatively well.     Plan: The patient has completed radiation treatment. The patient will return to radiation oncology clinic for routine followup in one month. I advised the patient to call or return sooner if they have any questions or concerns related to their recovery or treatment. ________________________________  Jodelle Gross, M.D., Ph.D.

## 2019-08-18 DEATH — deceased

## 2019-09-17 ENCOUNTER — Encounter: Payer: Self-pay | Admitting: Internal Medicine

## 2020-11-23 IMAGING — CT CT CHEST WITH CONTRAST
3 series · 15 of 32 positions shown, 18 images · IV contrast (iopamidol)
Comparison: Multiple exams, including PET-CT from 08/09/2018

CLINICAL DATA: Esophageal cancer restaging

EXAM:
CT CHEST AND ABDOMEN WITH CONTRAST
TECHNIQUE: Multidetector CT imaging of the chest and abdomen was performed
following the standard protocol during bolus administration of
intravenous contrast.
CONTRAST:  100mL GBA8TS-LAA IOPAMIDOL (GBA8TS-LAA) INJECTION 61%

[Series 2: chest/abd/ w/cm · axial · 0.73mm/px · z∈[-435,-70]mm · 8 of 95 slices shown]
[im 11/95  soft-tissue]
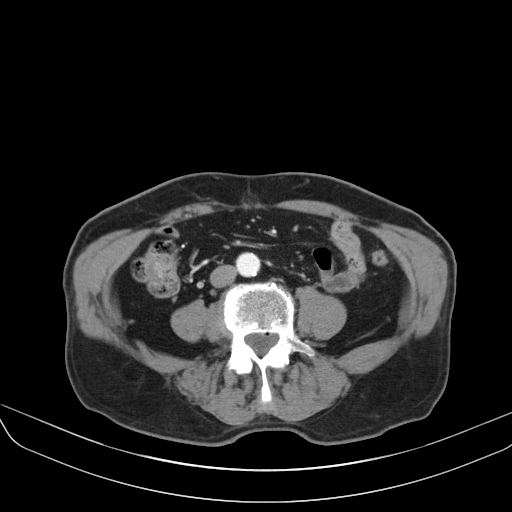
[im 21/95  soft-tissue]
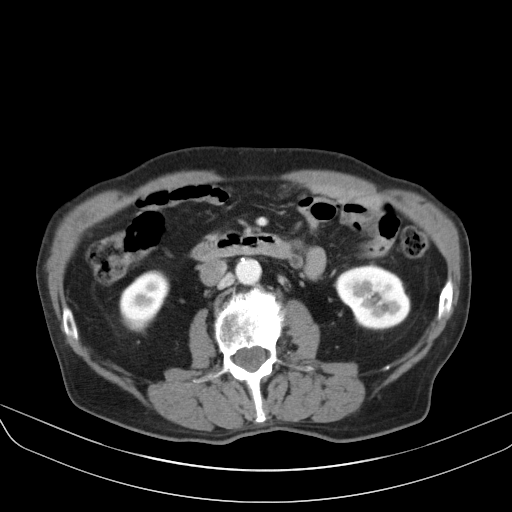
[im 32/95  soft-tissue]
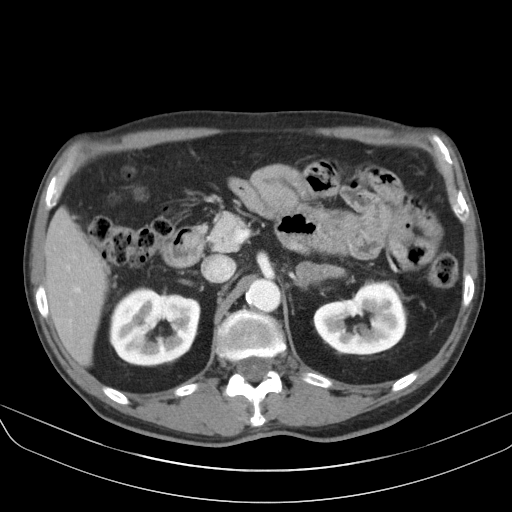
[im 42/95  soft-tissue]
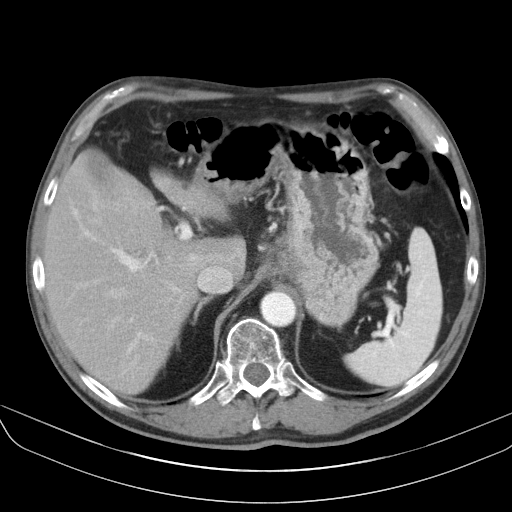
[im 53/95  soft-tissue]
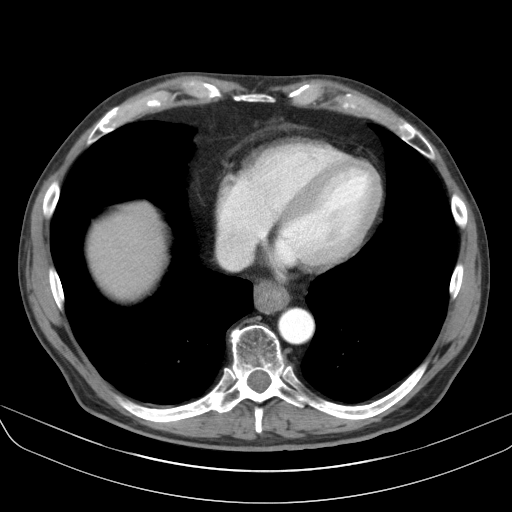
[im 63/95  soft-tissue]
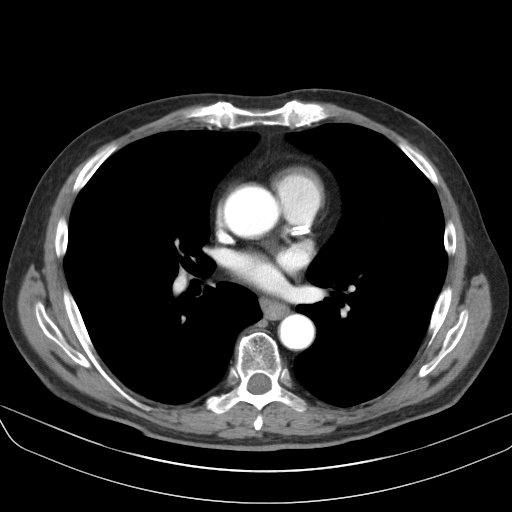
[im 74/95  soft-tissue]
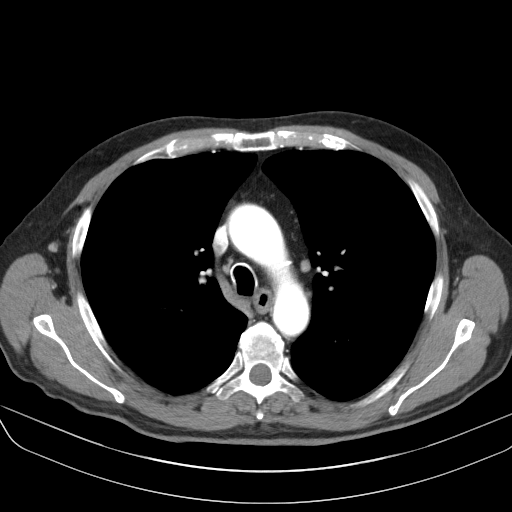
[im 84/95  soft-tissue]
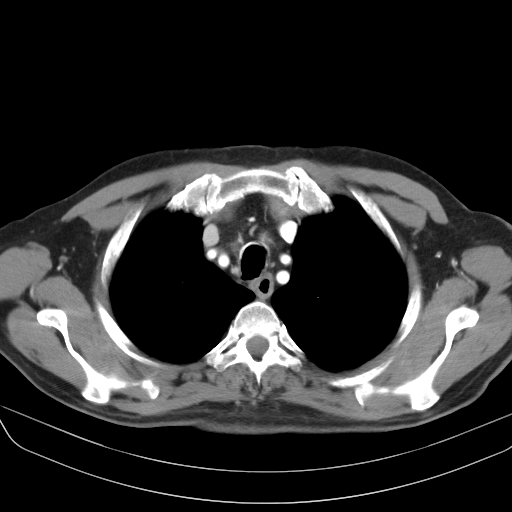

[Series 6: lung · axial · 0.71mm/px · z∈[-301,-149]mm · 5 of 162 slices shown]
[im 19/162  bone]
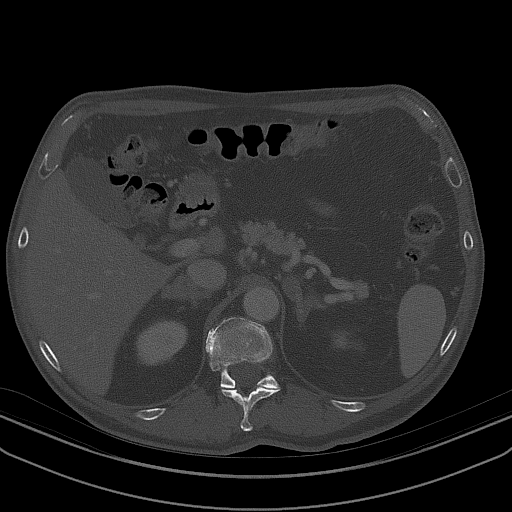
[im 38/162  bone]
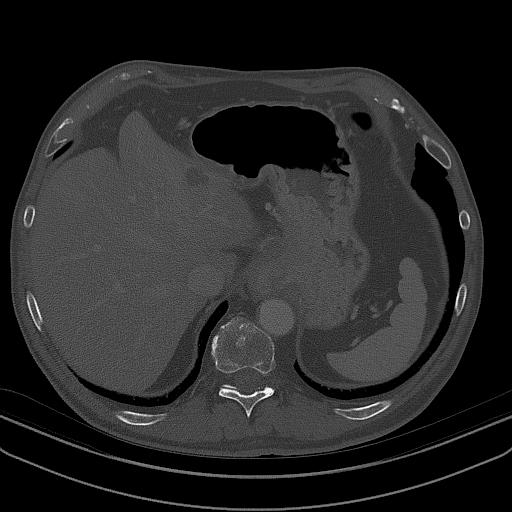
[im 57/162  bone]
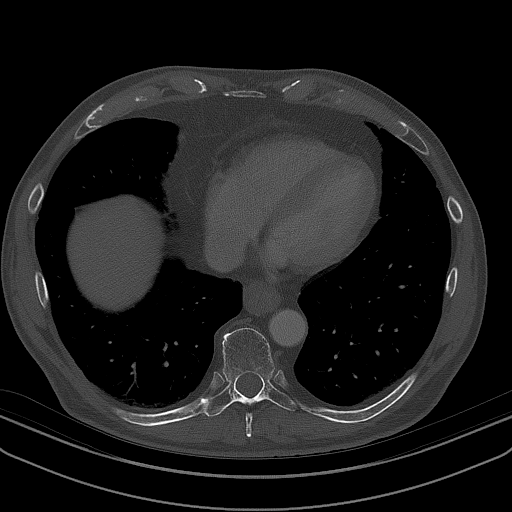
[im 76/162  bone]
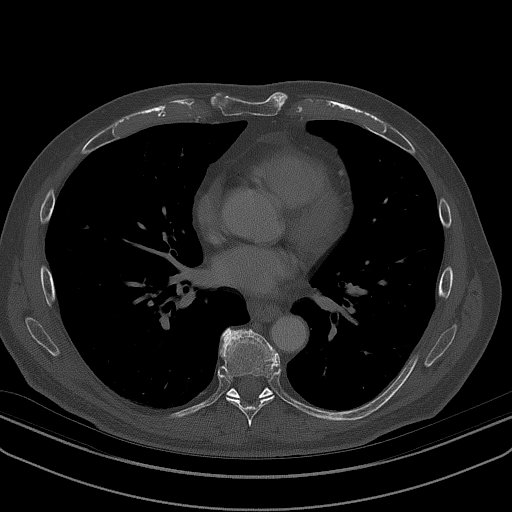
[im 95/162  bone]
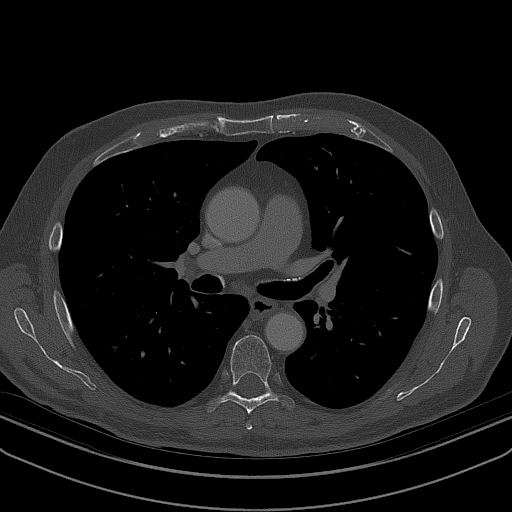

[Series 7: renal delay · axial · delayed · 0.73mm/px · z∈[-393,-333]mm · 2 of 38 slices shown, 5 images]
[im 13/38  soft-tissue]
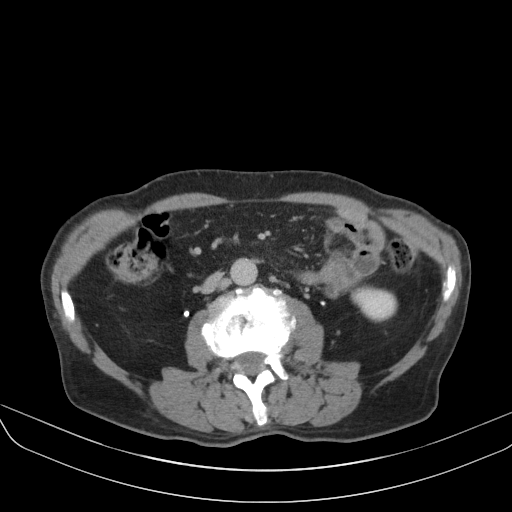
[im 13/38  lung]
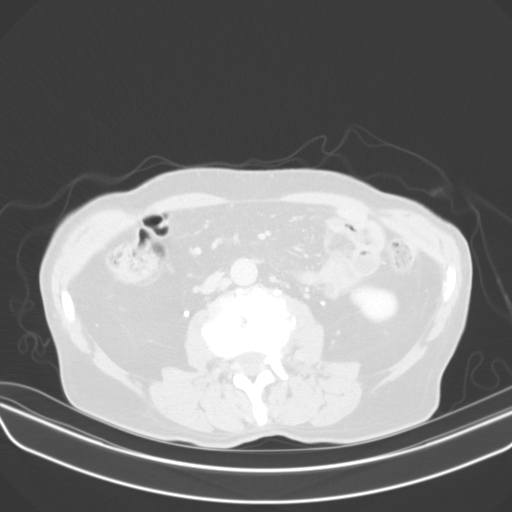
[im 13/38  bone]
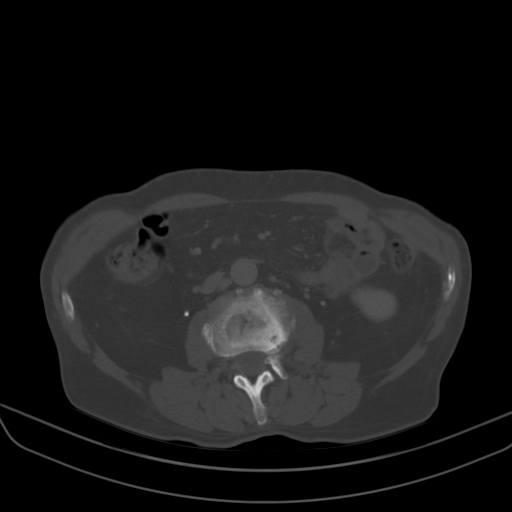
[im 25/38  soft-tissue]
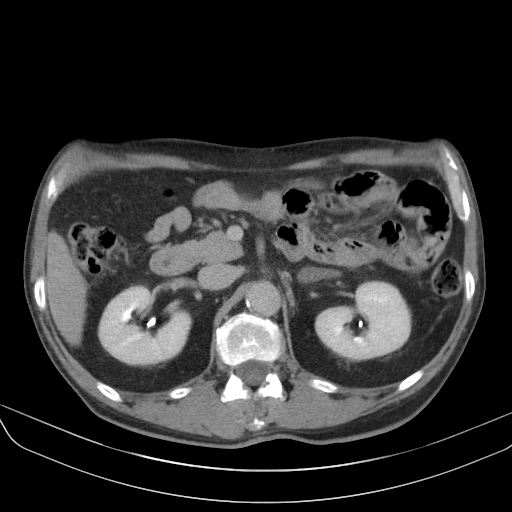
[im 25/38  lung]
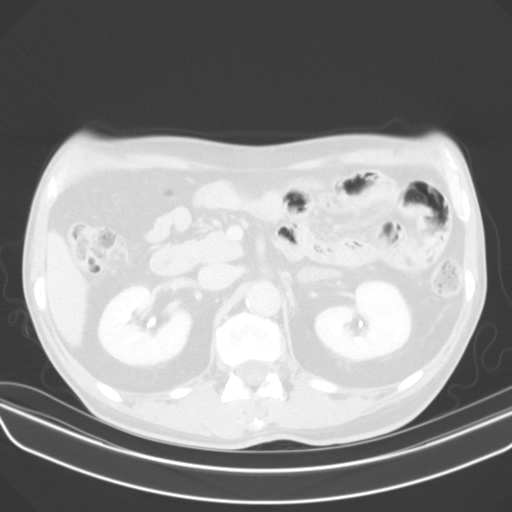

[15 of 32 positions shown; findings below may reference images not displayed]

FINDINGS: CT CHEST FINDINGS

Cardiovascular: Minimal atherosclerotic calcification of the aortic
arch. Density in the left anterior descending coronary artery
potentially atherosclerotic calcification or a stent.

Mediastinum/Nodes: Small mediastinal and axillary lymph nodes are
not pathologically enlarged and are similar to the prior exam.
Continued circumferential distal esophageal wall thickening
extending into the stomach.

Lungs/Pleura: Unremarkable

Musculoskeletal: Mild thoracic spondylosis.

CT ABDOMEN FINDINGS

Hepatobiliary: The hypodense lesions in the left hepatic lobe are
stable and probably cysts. No new or worrisome enhancing hepatic
lesion. Gallbladder unremarkable. No biliary dilatation.

Pancreas: Unremarkable

Spleen: Unremarkable

Adrenals/Urinary Tract: Stable appearance of bilateral adrenal
adenomas. The kidneys appear unremarkable.

Stomach/Bowel: The gastric cardia portion of the gastroesophageal
mass is less striking than on the 08/09/2018 exam and less striking
than on 08/01/2018. Adjacent gastrohepatic ligament adenopathy is
still present with clustered nodes measuring 1.2 cm in short axis,
previously the same on 08/01/2018.

Vascular/Lymphatic: As noted above there are continued clustered
gastrohepatic ligament lymph nodes. An aortocaval node measures
cm in short axis on image 76/2, formerly the same, and not formerly
hypermetabolic.

Other: No supplemental non-categorized findings.

Musculoskeletal: Mild right foraminal impingement at L4-5 due to
spurring.
IMPRESSION: 1. From a morphologic standpoint, the gastric cardia portion of the
gastroesophageal mass is less striking than on the prior exam but
the distal esophageal wall continues to appear thickened. The
clustered gastrohepatic little lymph nodes which were previously
mildly abnormally hypermetabolic have not significantly changed in
size/appearance.
2. Other imaging findings of potential clinical significance: Aortic
Atherosclerosis ([id number]). Coronary atherosclerosis. Left
hepatic lobe cysts. Bilateral adrenal adenomas. Mild right foraminal
impingement at L4-5.

## 2021-06-01 IMAGING — DX DG CHEST 2V
2 series · 2 of 2 positions shown · non-contrast
Comparison: Radiograph 03/24/2019.

CLINICAL DATA: Thoracic back pain. Adenocarcinoma of
gastroesophageal junction. Status post esophagectomy with esophageal
stent in place. Most recent stent placement 6 days ago.

EXAM:
CHEST - 2 VIEW

[chest lat]
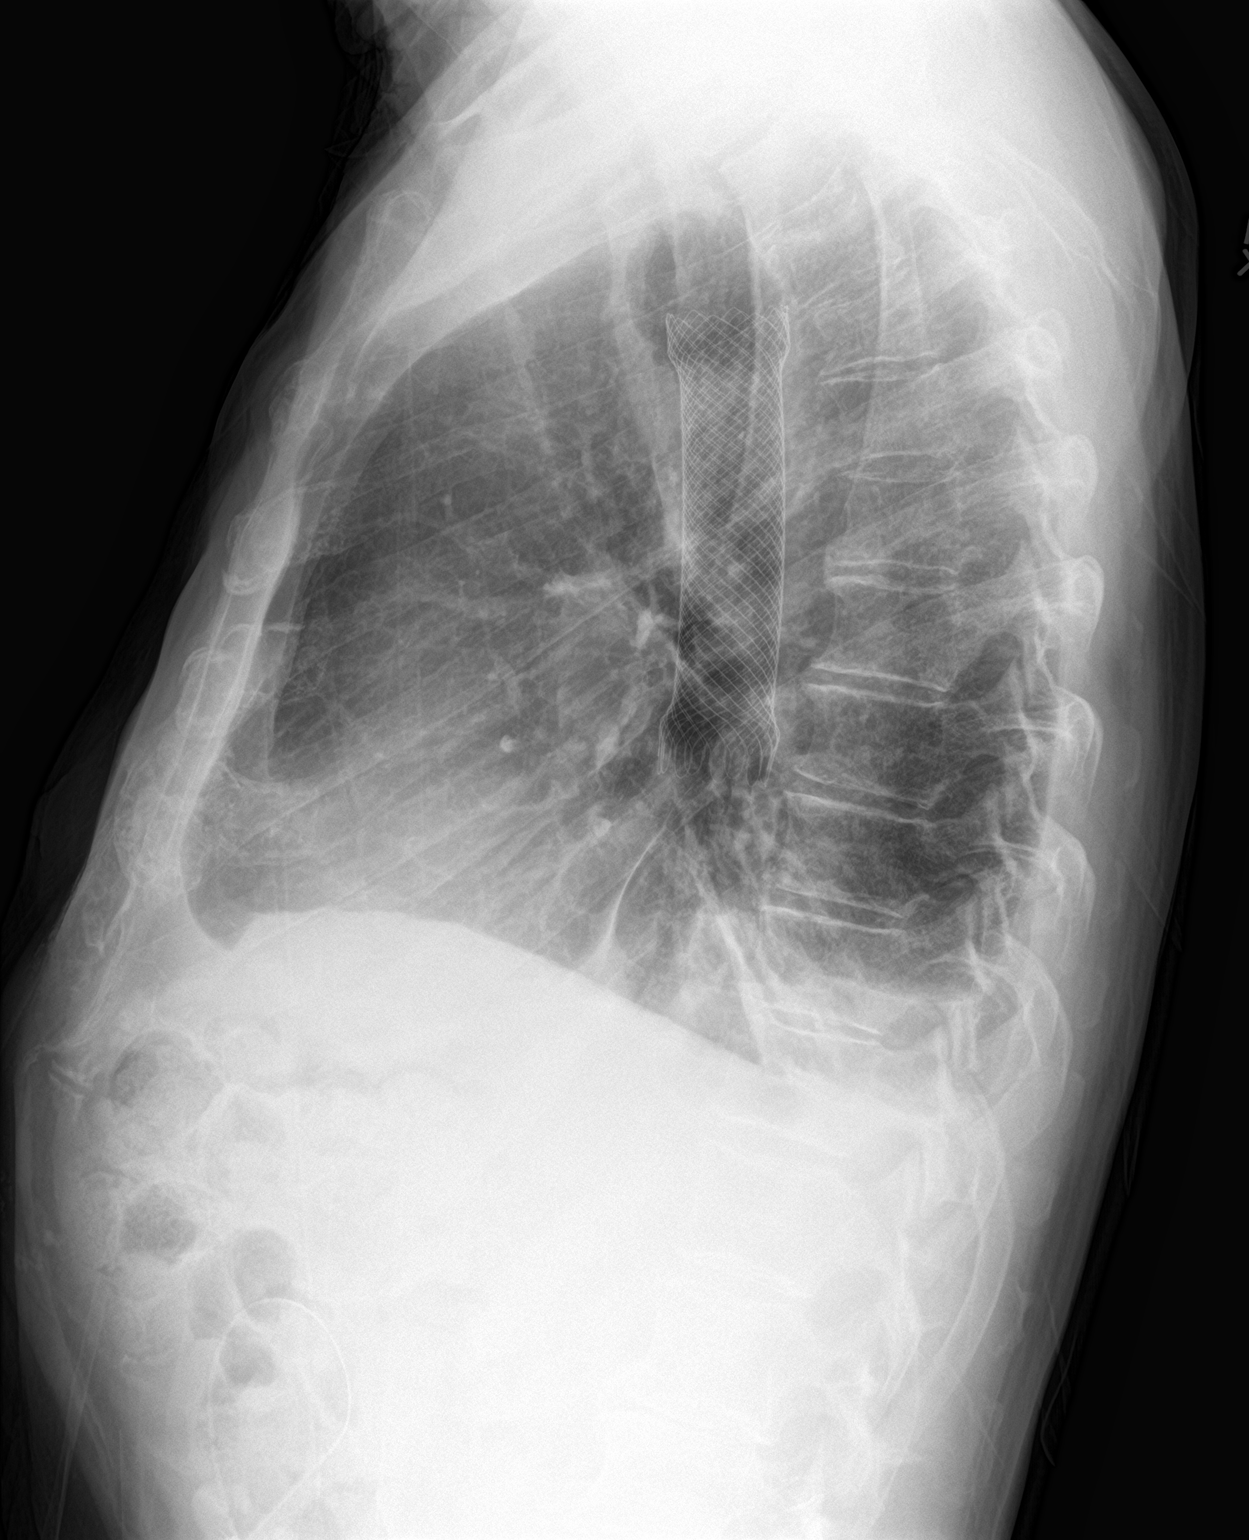

[chest pa]
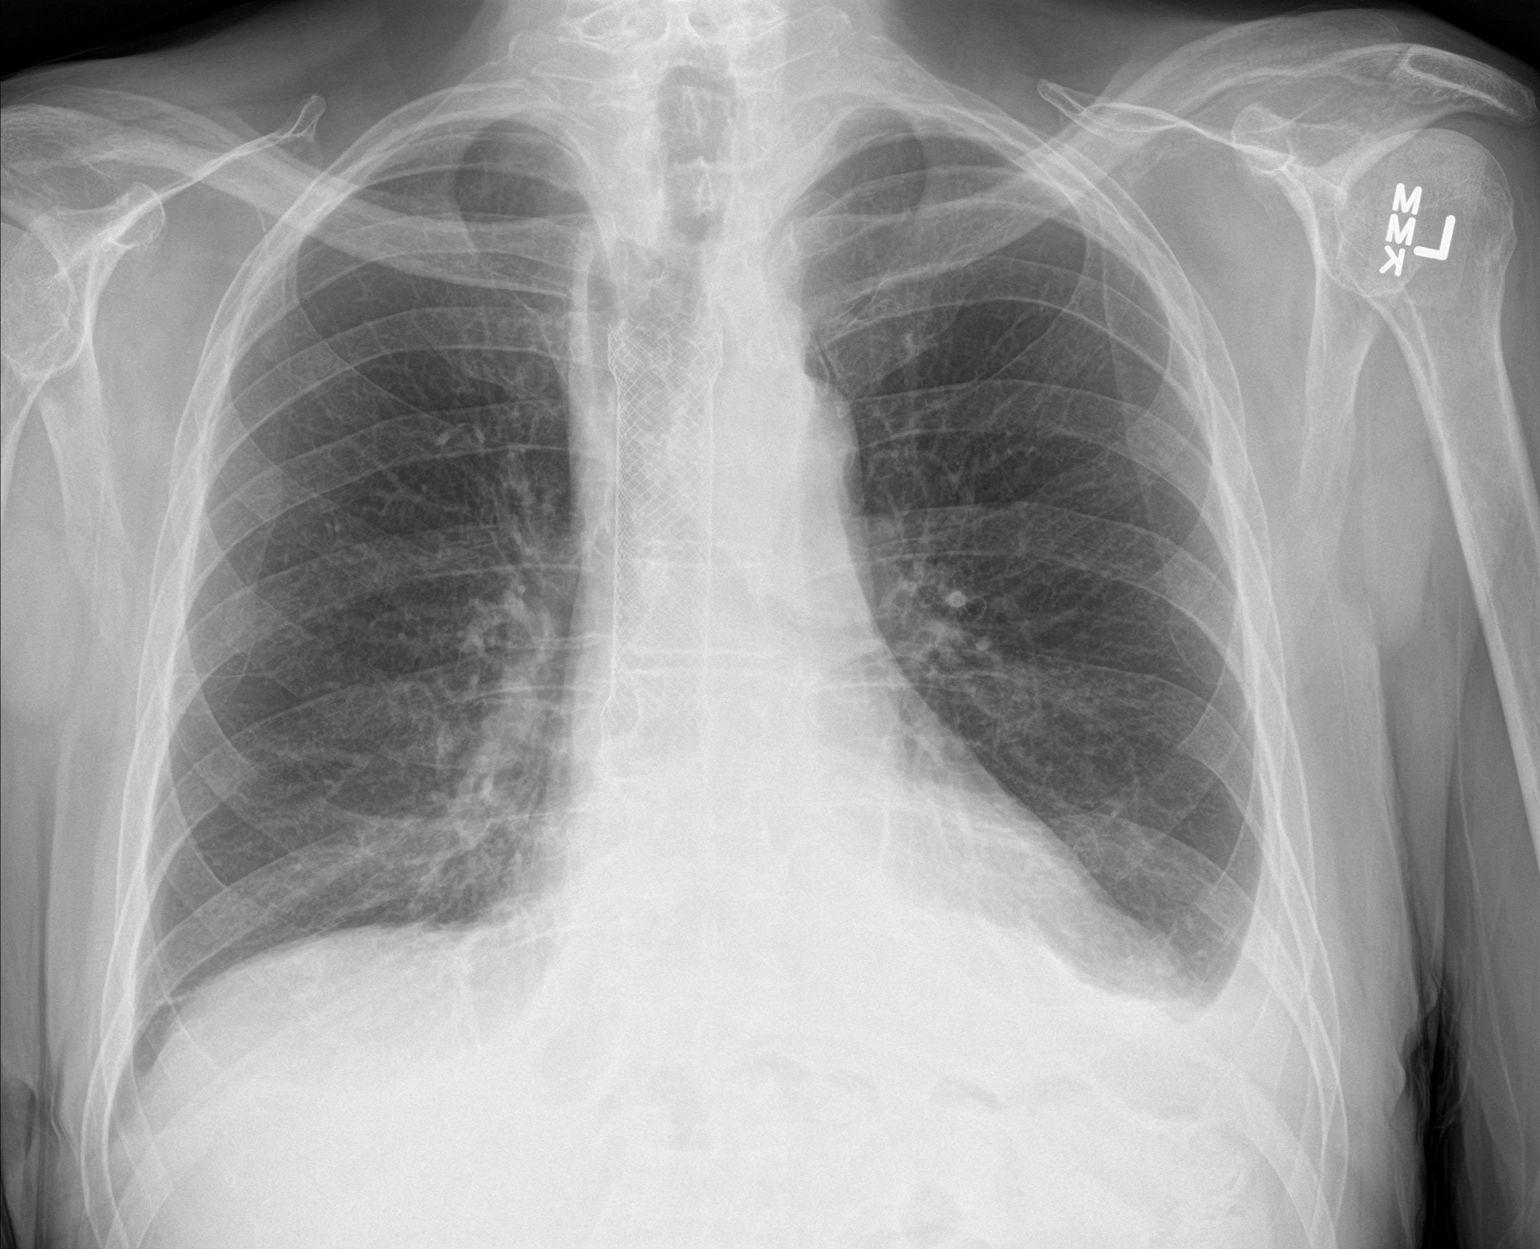

[2 of 2 positions shown; findings below may reference images not displayed]

FINDINGS: Esophageal stent is in place, proximal aspect of the thoracic inlet
at the level of the clavicles. Distal aspect of the stent is
approximately 4.5 cm from the diaphragmatic hiatus. Small left
pleural effusion/pleural thickening with adjacent basilar scarring.
Unchanged heart size and mediastinal contours. No pneumothorax. No
acute airspace disease. Mild superior endplate compression deformity
of upper thoracic vertebra, tentatively T5, new from prior exam.
IMPRESSION: 1. Esophageal stent in place, proximal aspect of the stent at the
level of the clavicles. Distal aspect of the stent is approximately
4.5 cm from the diaphragmatic hiatus.
2. Mild upper thoracic compression fracture (tentatively T5) is new
from prior exam and may be cause of patient's back pain.
